# Patient Record
Sex: Female | Born: 1940 | Race: White | Hispanic: No | Smoking: Never smoker
Health system: Southern US, Community
[De-identification: ages and names within clinical notes are randomized; demographics above are authoritative.]

## PROBLEM LIST (undated history)

## (undated) DIAGNOSIS — T7840XA Allergy, unspecified, initial encounter: Secondary | ICD-10-CM

## (undated) DIAGNOSIS — K5790 Diverticulosis of intestine, part unspecified, without perforation or abscess without bleeding: Secondary | ICD-10-CM

## (undated) DIAGNOSIS — G4730 Sleep apnea, unspecified: Secondary | ICD-10-CM

## (undated) DIAGNOSIS — R002 Palpitations: Secondary | ICD-10-CM

## (undated) DIAGNOSIS — E039 Hypothyroidism, unspecified: Secondary | ICD-10-CM

## (undated) DIAGNOSIS — K2100 Gastro-esophageal reflux disease with esophagitis, without bleeding: Secondary | ICD-10-CM

## (undated) DIAGNOSIS — K219 Gastro-esophageal reflux disease without esophagitis: Secondary | ICD-10-CM

## (undated) DIAGNOSIS — E669 Obesity, unspecified: Secondary | ICD-10-CM

## (undated) DIAGNOSIS — Q423 Congenital absence, atresia and stenosis of anus without fistula: Secondary | ICD-10-CM

## (undated) DIAGNOSIS — E785 Hyperlipidemia, unspecified: Secondary | ICD-10-CM

## (undated) DIAGNOSIS — F329 Major depressive disorder, single episode, unspecified: Secondary | ICD-10-CM

## (undated) DIAGNOSIS — G4733 Obstructive sleep apnea (adult) (pediatric): Secondary | ICD-10-CM

## (undated) DIAGNOSIS — F32A Depression, unspecified: Secondary | ICD-10-CM

## (undated) DIAGNOSIS — I10 Essential (primary) hypertension: Secondary | ICD-10-CM

## (undated) DIAGNOSIS — K449 Diaphragmatic hernia without obstruction or gangrene: Secondary | ICD-10-CM

## (undated) DIAGNOSIS — M1990 Unspecified osteoarthritis, unspecified site: Secondary | ICD-10-CM

## (undated) DIAGNOSIS — Z5189 Encounter for other specified aftercare: Secondary | ICD-10-CM

## (undated) DIAGNOSIS — N189 Chronic kidney disease, unspecified: Secondary | ICD-10-CM

## (undated) DIAGNOSIS — E119 Type 2 diabetes mellitus without complications: Secondary | ICD-10-CM

## (undated) DIAGNOSIS — H269 Unspecified cataract: Secondary | ICD-10-CM

## (undated) HISTORY — PX: COLOSTOMY: SHX63

## (undated) HISTORY — DX: Allergy, unspecified, initial encounter: T78.40XA

## (undated) HISTORY — DX: Unspecified cataract: H26.9

## (undated) HISTORY — DX: Palpitations: R00.2

## (undated) HISTORY — PX: CARPAL TUNNEL RELEASE: SHX101

## (undated) HISTORY — DX: Sleep apnea, unspecified: G47.30

## (undated) HISTORY — DX: Diverticulosis of intestine, part unspecified, without perforation or abscess without bleeding: K57.90

## (undated) HISTORY — DX: Obesity, unspecified: E66.9

## (undated) HISTORY — PX: CYSTECTOMY: SUR359

## (undated) HISTORY — PX: COLON RESECTION: SHX5231

## (undated) HISTORY — DX: Hyperlipidemia, unspecified: E78.5

## (undated) HISTORY — DX: Congenital absence, atresia and stenosis of anus without fistula: Q42.3

## (undated) HISTORY — DX: Depression, unspecified: F32.A

## (undated) HISTORY — DX: Essential (primary) hypertension: I10

## (undated) HISTORY — DX: Gastro-esophageal reflux disease with esophagitis: K21.0

## (undated) HISTORY — DX: Hypothyroidism, unspecified: E03.9

## (undated) HISTORY — PX: UPPER GASTROINTESTINAL ENDOSCOPY: SHX188

## (undated) HISTORY — DX: Chronic kidney disease, unspecified: N18.9

## (undated) HISTORY — PX: RECTOVAGINAL FISTULA CLOSURE: SUR265

## (undated) HISTORY — DX: Gastro-esophageal reflux disease without esophagitis: K21.9

## (undated) HISTORY — DX: Major depressive disorder, single episode, unspecified: F32.9

## (undated) HISTORY — DX: Unspecified osteoarthritis, unspecified site: M19.90

## (undated) HISTORY — DX: Obstructive sleep apnea (adult) (pediatric): G47.33

## (undated) HISTORY — DX: Diaphragmatic hernia without obstruction or gangrene: K44.9

## (undated) HISTORY — DX: Type 2 diabetes mellitus without complications: E11.9

## (undated) HISTORY — DX: Encounter for other specified aftercare: Z51.89

## (undated) HISTORY — PX: APPENDECTOMY: SHX54

## (undated) HISTORY — DX: Gastro-esophageal reflux disease with esophagitis, without bleeding: K21.00

---
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
## 1997-04-06 ENCOUNTER — Ambulatory Visit (HOSPITAL_COMMUNITY): Admission: RE | Admit: 1997-04-06 | Discharge: 1997-04-06 | Payer: Self-pay | Admitting: Family Medicine

## 1998-08-09 ENCOUNTER — Encounter: Payer: Self-pay | Admitting: Family Medicine

## 1998-08-09 ENCOUNTER — Ambulatory Visit (HOSPITAL_COMMUNITY): Admission: RE | Admit: 1998-08-09 | Discharge: 1998-08-09 | Payer: Self-pay | Admitting: Family Medicine

## 1998-08-17 ENCOUNTER — Encounter: Payer: Self-pay | Admitting: Family Medicine

## 1998-08-17 ENCOUNTER — Ambulatory Visit (HOSPITAL_COMMUNITY): Admission: RE | Admit: 1998-08-17 | Discharge: 1998-08-17 | Payer: Self-pay | Admitting: Family Medicine

## 2000-03-14 ENCOUNTER — Ambulatory Visit (HOSPITAL_COMMUNITY): Admission: RE | Admit: 2000-03-14 | Discharge: 2000-03-14 | Payer: Self-pay | Admitting: Family Medicine

## 2002-01-15 ENCOUNTER — Ambulatory Visit (HOSPITAL_COMMUNITY): Admission: RE | Admit: 2002-01-15 | Discharge: 2002-01-15 | Payer: Self-pay | Admitting: Internal Medicine

## 2004-09-13 ENCOUNTER — Ambulatory Visit (HOSPITAL_BASED_OUTPATIENT_CLINIC_OR_DEPARTMENT_OTHER): Admission: RE | Admit: 2004-09-13 | Discharge: 2004-09-13 | Payer: Self-pay | Admitting: Family Medicine

## 2004-09-17 ENCOUNTER — Ambulatory Visit: Payer: Self-pay | Admitting: Internal Medicine

## 2005-04-23 ENCOUNTER — Ambulatory Visit: Payer: Self-pay | Admitting: Internal Medicine

## 2005-04-25 ENCOUNTER — Ambulatory Visit: Payer: Self-pay | Admitting: Internal Medicine

## 2005-05-15 ENCOUNTER — Encounter (INDEPENDENT_AMBULATORY_CARE_PROVIDER_SITE_OTHER): Payer: Self-pay | Admitting: Specialist

## 2005-05-15 ENCOUNTER — Ambulatory Visit: Payer: Self-pay | Admitting: Internal Medicine

## 2006-09-10 ENCOUNTER — Encounter: Admission: RE | Admit: 2006-09-10 | Discharge: 2006-09-10 | Payer: Self-pay | Admitting: Family Medicine

## 2006-12-03 ENCOUNTER — Ambulatory Visit (HOSPITAL_BASED_OUTPATIENT_CLINIC_OR_DEPARTMENT_OTHER): Admission: RE | Admit: 2006-12-03 | Discharge: 2006-12-03 | Payer: Self-pay | Admitting: Orthopedic Surgery

## 2007-03-10 ENCOUNTER — Ambulatory Visit: Payer: Self-pay | Admitting: Vascular Surgery

## 2007-03-10 ENCOUNTER — Ambulatory Visit: Admission: RE | Admit: 2007-03-10 | Discharge: 2007-03-10 | Payer: Self-pay | Admitting: Family Medicine

## 2007-03-10 ENCOUNTER — Encounter (INDEPENDENT_AMBULATORY_CARE_PROVIDER_SITE_OTHER): Payer: Self-pay | Admitting: Family Medicine

## 2007-12-17 ENCOUNTER — Encounter: Admission: RE | Admit: 2007-12-17 | Discharge: 2007-12-17 | Payer: Self-pay | Admitting: Family Medicine

## 2008-05-21 DIAGNOSIS — M790 Rheumatism, unspecified: Secondary | ICD-10-CM | POA: Insufficient documentation

## 2008-05-21 DIAGNOSIS — Z87738 Personal history of other specified (corrected) congenital malformations of digestive system: Secondary | ICD-10-CM

## 2008-05-21 DIAGNOSIS — Q431 Hirschsprung's disease: Secondary | ICD-10-CM | POA: Insufficient documentation

## 2008-05-21 DIAGNOSIS — M797 Fibromyalgia: Secondary | ICD-10-CM

## 2008-05-21 DIAGNOSIS — E039 Hypothyroidism, unspecified: Secondary | ICD-10-CM

## 2008-05-21 DIAGNOSIS — Q428 Congenital absence, atresia and stenosis of other parts of large intestine: Secondary | ICD-10-CM

## 2008-05-21 DIAGNOSIS — K5730 Diverticulosis of large intestine without perforation or abscess without bleeding: Secondary | ICD-10-CM | POA: Insufficient documentation

## 2008-05-21 DIAGNOSIS — Q423 Congenital absence, atresia and stenosis of anus without fistula: Secondary | ICD-10-CM

## 2008-05-21 DIAGNOSIS — K624 Stenosis of anus and rectum: Secondary | ICD-10-CM

## 2008-05-21 DIAGNOSIS — Q421 Congenital absence, atresia and stenosis of rectum without fistula: Secondary | ICD-10-CM

## 2008-05-21 DIAGNOSIS — K5900 Constipation, unspecified: Secondary | ICD-10-CM

## 2008-05-21 HISTORY — DX: Personal history of other specified (corrected) congenital malformations of digestive system: Z87.738

## 2008-05-26 ENCOUNTER — Ambulatory Visit: Payer: Self-pay | Admitting: Internal Medicine

## 2008-05-26 DIAGNOSIS — R1013 Epigastric pain: Secondary | ICD-10-CM

## 2008-05-26 DIAGNOSIS — K3189 Other diseases of stomach and duodenum: Secondary | ICD-10-CM

## 2008-06-08 ENCOUNTER — Encounter: Payer: Self-pay | Admitting: Internal Medicine

## 2008-06-08 ENCOUNTER — Ambulatory Visit: Payer: Self-pay | Admitting: Internal Medicine

## 2008-06-10 ENCOUNTER — Ambulatory Visit (HOSPITAL_COMMUNITY): Admission: RE | Admit: 2008-06-10 | Discharge: 2008-06-10 | Payer: Self-pay | Admitting: Internal Medicine

## 2008-06-11 ENCOUNTER — Encounter: Payer: Self-pay | Admitting: Internal Medicine

## 2008-06-14 ENCOUNTER — Ambulatory Visit: Payer: Self-pay | Admitting: Internal Medicine

## 2008-06-15 LAB — CONVERTED CEMR LAB: BUN: 34 mg/dL — ABNORMAL HIGH (ref 6–23)

## 2008-06-17 ENCOUNTER — Encounter: Payer: Self-pay | Admitting: Internal Medicine

## 2008-06-19 ENCOUNTER — Ambulatory Visit (HOSPITAL_COMMUNITY): Admission: RE | Admit: 2008-06-19 | Discharge: 2008-06-19 | Payer: Self-pay | Admitting: Internal Medicine

## 2008-08-26 ENCOUNTER — Ambulatory Visit: Payer: Self-pay | Admitting: Internal Medicine

## 2008-09-01 ENCOUNTER — Telehealth: Payer: Self-pay | Admitting: Internal Medicine

## 2008-09-06 ENCOUNTER — Encounter: Payer: Self-pay | Admitting: Internal Medicine

## 2009-01-31 ENCOUNTER — Encounter: Admission: RE | Admit: 2009-01-31 | Discharge: 2009-01-31 | Payer: Self-pay | Admitting: Family Medicine

## 2009-08-22 ENCOUNTER — Telehealth: Payer: Self-pay | Admitting: Internal Medicine

## 2009-08-29 ENCOUNTER — Ambulatory Visit: Payer: Self-pay | Admitting: Internal Medicine

## 2009-10-25 ENCOUNTER — Telehealth: Payer: Self-pay | Admitting: Internal Medicine

## 2010-02-15 ENCOUNTER — Ambulatory Visit (HOSPITAL_COMMUNITY)
Admission: RE | Admit: 2010-02-15 | Discharge: 2010-02-15 | Payer: Self-pay | Source: Home / Self Care | Attending: Family Medicine | Admitting: Family Medicine

## 2010-03-16 NOTE — Assessment & Plan Note (Signed)
Summary: DIARRHEA X2 WEEKS.               Hailey Hughes   History of Present Illness Visit Type: Follow-up Visit Primary GI MD: Lina Sar MD Primary Provider: Liana Gerold Requesting Provider: n/a Chief Complaint: Patient complains of diarrhea for the last 3 weeks. She did not take the Flagyl as directed she took them twice a day for 5 day so she has some extra tabs. She is still having some loose stools and urgency along with some gas. She states that she is having some generalized abdominal cramping.  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 Her last colonoscopy in April 2010 did not show any evidence of anastomotic stricture. She has a history of gastroesophageal reflux and a small hiatal hernia with mild gastritis which was seen on an upper endoscopy in April 2010. An abdominal ultrasound in April 2010 showed a hyperechoic lesion of the right kidney measuring 2.5 cm. 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 She has a two-week history of diarrhea which started with constipation. She was on herbal laxatives daily. Her diarrhea in the past was due to overflow. She has adult Hirschsprung's disease causing colonic inertia. She is doing much better now on Bentyl and Flagyl 250 mg twice a day.   GI Review of Systems    Reports abdominal pain and  bloating.     Location of  Abdominal pain: generalized.    Denies acid reflux, belching, chest pain, dysphagia with liquids, dysphagia with solids, heartburn, loss of appetite, nausea, vomiting, vomiting blood, weight loss, and  weight gain.      Reports diarrhea.     Denies anal fissure, black tarry stools, change in bowel habit, constipation, diverticulosis, fecal incontinence, heme positive stool, hemorrhoids,  irritable bowel syndrome, jaundice, light color stool, liver problems, rectal bleeding, and  rectal pain.    Current Medications (verified): 1)  Lotrel 5-10 Mg Caps (Amlodipine Besy-Benazepril Hcl) .... Take One By Mouth Once Daily 2)  Synthroid 112 Mcg Tabs (Levothyroxine Sodium) .Marland Kitchen.. 1 Once Daily 3)  Bumetanide 2 Mg Tabs (Bumetanide) .Marland Kitchen.. 1 Tablet By Mouth Once Daily 4)  Lipitor 20 Mg Tabs (Atorvastatin Calcium) .... Take One By Mouth Once Daily 5)  Metformin Hcl 500 Mg Xr24h-Tab (Metformin Hcl) .Marland Kitchen.. 1 Tablet By Mouth Two Times A Day 6)  Tizanidine Hcl 4 Mg Tabs (Tizanidine Hcl) .... Take 1 Tablet By Mouth At Bedtime and As Needed 7)  Qualaquin 324 Mg Caps (Quinine Sulfate) .Marland Kitchen.. 1 By Mouth Every 12 Hours 8)  Hydrocodone-Acetaminophen 5-500 Mg Tabs (Hydrocodone-Acetaminophen) .Marland Kitchen.. 1 By Mouth As Needed 9)  Byetta 10 Mcg Pen 10 Mcg/0.13ml Soln (Exenatide) .Marland Kitchen.. 10 Micrograms Subcutaneously Two Times A Day 10)  Aspirin 81 Mg Tbec (Aspirin) .Marland Kitchen.. 1 Once Daily 11)  Zyrtec Allergy 10 Mg Tabs (Cetirizine Hcl) .Marland Kitchen.. 1 By Mouth Once Daily 12)  Prevacid 30 Mg Cpdr (Lansoprazole) .... Take 1 Tablet By Mouth Two Times A Day 13)  Calcium 1200-1000 Mg-Unit Chew (Calcium Carbonate-Vit D-Min) .... Take One By Mouth Once Daily 14)  Metoprolol Tartrate 25 Mg Tabs (Metoprolol Tartrate) .... Take 1 Tablet By Mouth Two Times A Day 15)  Centrum Silver  Tabs (Multiple Vitamins-Minerals) .... Take  1 Tablet By Mouth Once A Day 16)  Elocon 0.1 % Crea (Mometasone Furoate) .... Apply As Needed For Exzema 17)  Bentyl 10 Mg Caps (Dicyclomine Hcl) .... Take 1 Tablet By Mouth Two Times A Day 18)  Align  Caps (Probiotic Product) .... Take One By Mouth Once Daily 19)  Tramadol Hcl 50 Mg Tabs (Tramadol Hcl) .... Take One By Mouth Three Times A Day 20)  Astelin 137 Mcg/spray Soln (Azelastine Hcl) .... Two Sprays Each Nostril As Needed 21)  Nasonex 50 Mcg/act Susp (Mometasone Furoate) .... Two Sprays Each Nostril Two Times A  Day 22)  Citrucel 500 Mg Tabs (Methylcellulose (Laxative)) .... Take One By Mouth Once Daily 23)  Vitamin D3 1000 Unit Tabs (Cholecalciferol) .... Take One By Mouth Once Daily  Allergies (verified): 1)  ! Epinephrine 2)  ! * Bee Stings  Past History:  Past Medical History: Reviewed history from 05/21/2008 and no changes required. Current Problems:  DIVERTICULOSIS, COLON (ICD-562.10) HYPOTHYROIDISM (ICD-244.9) FIBROSITIS (ICD-729.0) UNSPECIFIED CONSTIPATION (ICD-564.00) HIRSCHSPRUNG'S DISEASE (ICD-751.3) IMPERFORATE ANUS (ICD-751.2) Hx of STENOSIS OF RECTUM AND ANUS (ICD-569.2) ]  Past Surgical History: Appendectomy colostomy (age 20-6) due to imperforate anus sigmoid colon resection-age 60 Repair of congenital rectovaginal fistula Cyst removed from sacral region carpal tunnel release right  Family History: Family History of Heart Disease: Father, Brother Family History of Diabetes: Sister Family History of Colon Cancer: Maternal Uncle  Social History: Reviewed history from 05/26/2008 and no changes required. Alcohol Use - no Illicit Drug Use - no Occupation: retired Engineer, civil (consulting) Patient has never smoked.  Daily Caffeine Use  diet soda  Review of Systems       The patient complains of arthritis/joint pain, back pain, and muscle pains/cramps.  The patient denies allergy/sinus, anemia, anxiety-new, blood in urine, breast changes/lumps, change in vision, confusion, cough, coughing up blood, depression-new, fainting, fatigue, fever, headaches-new, hearing problems, heart murmur, heart rhythm changes, itching, menstrual pain, night sweats, nosebleeds, pregnancy symptoms, shortness of breath, skin rash, sleeping problems, sore throat, swelling of feet/legs, swollen lymph glands, thirst - excessive , urination - excessive , urination changes/pain, urine leakage, vision changes, and voice change.         .ros  Vital Signs:  Patient profile:   70 year old female Height:      65  inches Weight:      191.4 pounds BMI:     31.97 Pulse rate:   70 / minute Pulse rhythm:   regular BP sitting:   120 / 64  (left arm) Cuff size:   regular  Vitals Entered By: Harlow Mares CMA Duncan Dull) (August 29, 2009 1:52 PM)  Physical Exam  General:  Well developed, well nourished, no acute distress. Mouth:  No deformity or lesions, dentition normal. Neck:  Supple; no masses or thyromegaly. Lungs:  Clear throughout to auscultation. Heart:  Regular rate and rhythm; no murmurs, rubs,  or bruits. Abdomen:  soft nontender abdomen with normoactive bowel sounds. Liver edge at costal margin. Bowel sounds are normal. Rectal:  decreased rectal sphincter tone. Small amount of Hemoccult negative stool. Extremities:  No clubbing, cyanosis, edema or deformities noted. Psych:  Alert and cooperative. Normal mood and affect.   Impression & Recommendations:  Problem # 1:  HIRSCHSPRUNG'S DISEASE (ICD-751.3) Patient has Hirschsprung's disease of the sigmoid colon causing colonic inertia intermittently resulting in constipation. Her intermittent diarrhea is usually due to overflow. She will start on Citrucel 3 g daily; she will take 6 500mg   tablets of Citrucel.  She will also continue Bentyl 10 mg twice a day and continue on Flagyl 250 mg 3 times a day for one week of treatment. Her diet should consist of high-fiber foods. She is up-to-date on her flexible sigmoidoscopy.  Problem # 2:  DYSPEPSIA (ICD-536.8) Patient is to continue Prevacid 30 mg daily.  Patient Instructions: 1)  Citrucel tablets 500 mg take 3 p.o. b.i.d. 2)  Bentyl 10 mg p.o. b.i.d. 3)  Flagyl 250 mg p.o. t.i.d. x 1 week. 4)  High-fiber diet. 5)  Office visit p.r.n. 6)  Copy sent to : Dr Karen Chafe 7)  The medication list was reviewed and reconciled.  All changed / newly prescribed medications were explained.  A complete medication list was provided to the patient / caregiver.

## 2010-03-16 NOTE — Progress Notes (Signed)
Summary: Triage  Phone Note Call from Patient Call back at Home Phone [phone] Call back at Work Phone [phone]   Caller: Patient Call For: Dr. Juanda Chance Reason for Call: Talk to Nurse Summary of Call: pt. has had diarrhea x2 weeks with nausea Initial call taken by: Karna Christmas,  August 22, 2009 10:25 AM  Follow-up for Phone Call        Last seen 08-26-08. Pt. currently c/o 2 weeks of loose-watery bm's, urgency & cramping. Denies blood, black stools. Has tried Immodium and Pepto, not much help. [employer] daily.   1) See Dr.Jaelin Fackler on 08-29-09 at 1:30pm 2) Bentyl 10mg  two times a day for 1 week, then as needed 3) Continue daily Align 4) Immodium as needed for diarrhea. 5) High Fiber Diet, ample daily fluids. 6) If symptoms become worse call back immediately.  Follow-up by: Laureen Ochs LPN,  August 22, 2009 10:37 AM  Additional Follow-up for Phone Call Additional follow up Details #1::        I would also add Flagyl 250 mg by mouth three times a day x 7 days, #30, 0 refill Additional Follow-up by: Hart Carwin MD,  August 22, 2009 2:18 PM    Additional Follow-up for Phone Call Additional follow up Details #2::    Above MD orders reviewed with patient. Med. sent to pt. pharmacy. Pt. instructed to call back as needed.  Follow-up by: Laureen Ochs LPN,  August 22, 2009 2:37 PM  New/Updated Medications: METRONIDAZOLE 250 MG TABS (METRONIDAZOLE) Take 1 by mouth 3 times daily for 7 days. Prescriptions: METRONIDAZOLE 250 MG TABS (METRONIDAZOLE) Take 1 by mouth 3 times daily for 7 days.  #30 x 0   Entered by:   Laureen Ochs LPN   Authorized by:   Hart Carwin MD   Signed by:   Laureen Ochs LPN on 29/56/2130   Method used:   Electronically to        [facility]  [address] [id number] (retail)       [address]       [place], [place]  [address]       Ph: [phone] or [phone]       Fax: [phone]   RxID:   [id number]

## 2010-03-16 NOTE — Progress Notes (Signed)
Summary: update  Phone Note Call from Patient Call back at Work Phone [phone]   Caller: Patient Call For: Dr. Juanda Chance Reason for Call: Talk to Nurse Summary of Call: reporting still having problems with loose stools Initial call taken by: Vallarie Mare,  October 25, 2009 4:02 PM  Follow-up for Phone Call        Left message for patient to call back Darcey Nora RN, [facility]  October 26, 2009 8:43 AM  Patient is still having loose stools, mostly after meals.  She is requesting an increase in her dicyclomine.  She is requesting it  instead of BID she would like to take it ac meals.  She is no longer having urgency on a daily basis.  Mostly her issues are after meals.  Please advise Follow-up by: Darcey Nora RN, CGRN,  October 26, 2009 12:06 PM  Additional Follow-up for Phone Call Additional follow up Details #1::        chart reviewed, last OV 08/2009. Please increase her Bentyl to 20 mg , #90, 1 by mouth three times a day ( ac)., 3 refills. Additional Follow-up by: Hart Carwin MD,  October 26, 2009 4:40 PM    Additional Follow-up for Phone Call Additional follow up Details #2::    patient requests a 90 day supply I have called this in Follow-up by: Darcey Nora RN, CGRN,  October 27, 2009 8:41 AM  New/Updated Medications: DICYCLOMINE HCL 20 MG TABS (DICYCLOMINE HCL) 1 by mouth ac meals Prescriptions: DICYCLOMINE HCL 20 MG TABS (DICYCLOMINE HCL) 1 by mouth ac meals  #270 x 1   Entered by:   Darcey Nora RN, CGRN   Authorized by:   Hart Carwin MD   Signed by:   Darcey Nora RN, CGRN on 10/27/2009   Method used:   [facility] to        [facility]  [address] [id number]       [address]       [place], [place]  [address]       Ph: [phone] or [phone]       Fax: [phone]   RxID:   [id number]

## 2010-06-27 NOTE — Op Note (Signed)
Hailey Hughes, Hailey Hughes             ACCOUNT NO.:  [id number]   MEDICAL RECORD NO.:  [id number]          PATIENT TYPE:  AMB   LOCATION:  DSC                          FACILITY:  [facility]   PHYSICIAN:  Katy Fitch. Sypher, [name] DATE OF BIRTH:  Jun 10, 1940   DATE OF PROCEDURE:  12/03/2006  DATE OF DISCHARGE:                               OPERATIVE REPORT   PREOPERATIVE DIAGNOSES:  1. Severe right carpal tunnel syndrome.  2. Associated diabetes.   POSTOPERATIVE DIAGNOSES:  1. Severe right carpal tunnel syndrome.  2. Associated diabetes.   OPERATION:  Release of right transverse carpal ligament.   OPERATING SURGEON:  Katy Fitch. Sypher, [name]   ASSISTANT:  Marveen Reeks. Dasnoit, P.A.-C.   ANESTHESIA:  IV regional at forearm level.   SUPERVISING ANESTHESIOLOGIST:  Zenon Mayo, MD   INDICATIONS:  Hailey Hughes is a 70 year old woman with a history of  diabetes and severe obstructive sleep apnea.  She was referred by Dr.  Evelena Peat for evaluation of hand pain and numbness.  Clinical  examination suggested severe carpal tunnel syndrome and  electrodiagnostic studies confirmed severe median neuropathy.   She has had a sleep study accomplished by Dr. Maple Hudson on September 13, 2004  which revealed severe obstructive sleep apnea.  Dr. Sampson Goon  recommended proceeding with IV regional block in view of these findings.  After informed consent, she is brought to the operating room at this  time.   DESCRIPTION OF PROCEDURE:  Hailey Hughes is brought to the operating  room and placed in the supine position on the operating table.  Following placement of an IV regional block at proximal forearm level,  with a tourniquet at 300 mmHg due to systolic hypertension, the right  arm was prepped with Betadine soap and solution, and sterilely draped.  After waiting 10 minutes, anesthesia was satisfactory.  The incision  site was supplemented with 2% plain lidocaine subdermally.   The procedure  commenced with a short incision in the line of the ring  finger and the palm.  The subcutaneous tissues were carefully divided on  entering the palmar fascia.  This was split longitudinally to reveal the  common sensory branch of the median nerve and the superficial palmar  arch.  The distal margin of the transverse carpal ligament was isolated,  followed by release of the transverse carpal ligament along its ulnar  border, extending into the distal forearm.   Due to the IV regional the typical venous bleeding issues were  encountered.  The veins were isolated and electrocauterized.  A  __________  ENT retractor was used to assure complete release of the  volar forearm fascia, and the transverse carpal ligament.   The contents of carpal canal were inspected.  There was a rather  edematous tenosynovitis noted.  The median nerve was under significant  compression.  The wound was inspected for bleeding points, followed by  repair the skin with intradermal 3-0 Prolene suture.  A compressive  dressing was applied with a volar plaster splint maintaining the wrist  in 5 degrees of dorsiflexion.   For aftercare Hailey Hughes is provided  a prescription for Percocet 5 mg  one to two tablets p.o. q.4-6 h. p.r.n. pain 20 tablets without refill.  We will see her back for her follow up in the office in approximately 1  week for dressing change.  We anticipate suture removal at approximately  10-12 days.      Katy Fitch Sypher, [name]  Electronically Signed     RVS/MEDQ  D:  12/03/2006  T:  12/03/2006  Job:  [id number]   cc:   Evelena Peat, [name]

## 2010-06-30 NOTE — Procedures (Signed)
[name], Hailey Hughes             ACCOUNT NO.:  [id number]   MEDICAL RECORD NO.:  [id number]          PATIENT TYPE:  OUT   LOCATION:  SLEEP CENTER                 [facility]ACILITY[facility]   PHYSICIAN:  [name], [name] DATE OF BIRTH:  07-Jun-1940   DATE OF STUDY:  09/13/2004                              NOCTURNAL POLYSOMNOGRAM   REFERRING PHYSICIAN:  [name], MD   INDICATION FOR STUDY:  Hypersomnia with sleep apnea.  Epworth sleepiness  score 6/24, BMI 38, weight 230 pounds.   SLEEP ARCHITECTURE:  Showed short total sleep time 221 minutes with sleep  efficiency 66%.  Stage I 13%, stage II 74%, stages III and IV 4%, REM 9% of  total sleep time.  Sleep latency 9 minutes, REM latency 123 minutes, awake  after sleep onset 91 minutes.  Arousal index increased to 51.  No bedtime  medication was reported.   RESPIRATORY DATA:  Split study protocol.  Respiratory disturbance index  (RDI, AHI) 57.1 obstructive events per hour, indicating severe obstructive  sleep apnea/hypopnea syndrome before CPAP.  This included 59 obstructive  apneas and 79 hypopneas before CPAP.  Most sleep and events were while  supine.  REM RDI 67.  CPAP was titrated to 12 cwp, RDI 15.1 per hour, before  technician ran out of titration time.  A Petite ComfortGel mask was used  with heated humidifier.   OXYGEN DATA:  Moderate to loud snoring with oxygen desaturation to a nadir  of 67% before CPAP.  With CPAP control, saturation of 94-98% on room air.   CARDIAC DATA:  Normal sinus rhythm with rare PVCs.   MOVEMENT/PARASOMNIA:  Occasional leg jerk with little effect on sleep.   IMPRESSION/RECOMMENDATION:  1.  Severe obstructive sleep apnea/hypopnea syndrome, respiratory      disturbance index 57.1 per hour with moderate to loud snoring and oxygen      desaturation to 67%.  2.  Significant improvement with continuous positive airway pressure      titration to 12 cwp, respiratory disturbance index 15.1 per hour using  a      Petite ComfortGel mask with heated humidifier.  Consider starting the      patient on home continuous positive airway pressure at 12 cwp if this      treatment modality is chosen.  If she remains      symptomatic with significant home report of breakthrough apnea and      snoring, then pressure can be adjusted upwards.      [name], [name]  Diplomat    CDY/MEDQ  D:  09/17/2004 11:49:57  T:  09/18/2004 13:41:05  Job:  [id number]

## 2010-06-30 NOTE — Op Note (Signed)
NAME:  Hailey Hughes, Hailey Hughes                       ACCOUNT NO.:  [id number]   MEDICAL RECORD NO.:  [id number]                   PATIENT TYPE:  AMB   LOCATION:  ENDO                                 FACILITY:  [facility]   PHYSICIAN:  Lina Sar, [name] [facility]               DATE OF BIRTH:  October 05, 1940   DATE OF PROCEDURE:  01/15/2002  DATE OF DISCHARGE:                                 OPERATIVE REPORT   PROCEDURE:  Colonoscopy and dilatation of rectal stricture.   INDICATIONS:  This is a 70 year old white female who has a history of  _________ as an infant for what appeared to be an imperforate anus.  She was  seen several years ago  for anastomotic strictures which was dilated with  complete relief of this problem of her symptoms of incomplete evacuation.  She now has had recurrent symptoms of  incomplete emptying and is undergoing  colonoscopy and possible dilatation of the anastomotic stricture.   ENDOSCOPE:  Olympus _______  videoscope.   SEDATION:  Versed 7 mg IV, Demerol 75 mg IV.   FINDINGS:  The Olympus videoscope was passed into the rectum to the sigmoid  colon. The patient was  monitored by pulse oximeter.  Oxygen saturations  were between 85% to 98%. Her prep was adequate. The anal canal was normal  but rectal tone was somewhat decreased. Then about 5 cm from the rectum was  an anastomosis of the sigmoid colon, which was mildly narrowed to about 16  to 17 mm in diameter. The endoscope traveled through the anastomosis without  any pressure or any resistance. There were no active erosions or  inflammatory changes seen within the stricture. Proximal to the stricture in  the sigmoid colon, the patient had diffuse melanosis coli. There were no  definite diverticula. The colonoscope passed easily through the descending  colon, the splenic flexure, the transverse colon, hepatic flexure through  the ascending colon to the cecum. There was some residual stool in the cecal  pouch. This was  irrigated in order to see the entire mucosa of the cecal  pouch.   After inspecting the cecal pouch and the right colon, the colonoscope was  then retracted through the transverse colon to the  left colon. A guide wire  was then placed through the endoscope. The endoscope was retracted and a  Savary dilator 20 mm passed over the guide wire without fluoroscopic  guidance, up to a level of about 30 cm. There was mild resistance through  the stricture but there was no blood on the dilator. The patient tolerated  the procedure well.   IMPRESSION:  1. A sigmoid stricture at 5 cm from the rectum, benign, status post     dilatation to 20 mm with Savary dilator.  2. Melanosis coli.   PLAN:  1. Resume high fiber diet.  2. May use dietary supplement over the fiber, Citrucel or Metamucil.  3.  Consider adding mineral oil to improve the bowel habits.                                               Lina Sar, [name] [facility]   DB/MEDQ  D:  01/15/2002  T:  01/15/2002  Job:  [id number]

## 2010-08-18 ENCOUNTER — Other Ambulatory Visit: Payer: Self-pay | Admitting: Internal Medicine

## 2010-08-29 ENCOUNTER — Telehealth: Payer: Self-pay | Admitting: Internal Medicine

## 2010-08-29 NOTE — Telephone Encounter (Signed)
Patient has been advised that she needs to call the pharmacy that she wishes to switch prescriptions to and they will get rx transferred. Patient verbalizes understanding.

## 2010-11-22 LAB — BASIC METABOLIC PANEL
BUN: 12
Chloride: 104
Glucose, Bld: 76
Potassium: 3.6

## 2010-11-22 LAB — POCT HEMOGLOBIN-HEMACUE: Operator id: 128471

## 2011-06-26 ENCOUNTER — Encounter: Payer: Self-pay | Admitting: *Deleted

## 2011-10-12 ENCOUNTER — Other Ambulatory Visit: Payer: Self-pay | Admitting: Family Medicine

## 2011-10-12 DIAGNOSIS — Z1231 Encounter for screening mammogram for malignant neoplasm of breast: Secondary | ICD-10-CM

## 2011-10-12 DIAGNOSIS — Z780 Asymptomatic menopausal state: Secondary | ICD-10-CM

## 2011-10-25 ENCOUNTER — Encounter: Payer: Self-pay | Admitting: *Deleted

## 2011-10-26 ENCOUNTER — Ambulatory Visit
Admission: RE | Admit: 2011-10-26 | Discharge: 2011-10-26 | Disposition: A | Discharge status: Home / Self Care | Payer: Medicare Other | Source: Ambulatory Visit | Attending: Family Medicine | Admitting: Family Medicine

## 2011-10-26 ENCOUNTER — Ambulatory Visit: Payer: Self-pay

## 2011-10-26 ENCOUNTER — Other Ambulatory Visit: Payer: Self-pay

## 2011-10-26 DIAGNOSIS — Z780 Asymptomatic menopausal state: Secondary | ICD-10-CM

## 2011-10-26 DIAGNOSIS — Z1231 Encounter for screening mammogram for malignant neoplasm of breast: Secondary | ICD-10-CM

## 2011-10-30 ENCOUNTER — Encounter: Payer: Self-pay | Admitting: Cardiology

## 2013-01-06 ENCOUNTER — Other Ambulatory Visit (HOSPITAL_COMMUNITY): Payer: Self-pay | Admitting: Family Medicine

## 2013-01-06 DIAGNOSIS — Z1231 Encounter for screening mammogram for malignant neoplasm of breast: Secondary | ICD-10-CM

## 2013-01-29 ENCOUNTER — Ambulatory Visit (HOSPITAL_COMMUNITY)
Admission: RE | Admit: 2013-01-29 | Discharge: 2013-01-29 | Disposition: A | Discharge status: Home / Self Care | Payer: Medicare Other | Source: Ambulatory Visit | Attending: Family Medicine | Admitting: Family Medicine

## 2013-01-29 DIAGNOSIS — Z1231 Encounter for screening mammogram for malignant neoplasm of breast: Secondary | ICD-10-CM | POA: Insufficient documentation

## 2013-09-15 ENCOUNTER — Encounter: Payer: Self-pay | Admitting: Internal Medicine

## 2013-09-15 ENCOUNTER — Encounter: Payer: Self-pay | Admitting: Gastroenterology

## 2013-09-18 ENCOUNTER — Encounter: Payer: Self-pay | Admitting: *Deleted

## 2013-09-25 ENCOUNTER — Ambulatory Visit: Payer: Medicare Other | Admitting: Gastroenterology

## 2013-11-24 ENCOUNTER — Ambulatory Visit (INDEPENDENT_AMBULATORY_CARE_PROVIDER_SITE_OTHER): Payer: Medicare Other | Admitting: Internal Medicine

## 2013-11-24 ENCOUNTER — Encounter: Payer: Self-pay | Admitting: Internal Medicine

## 2013-11-24 ENCOUNTER — Other Ambulatory Visit: Payer: Medicare Other

## 2013-11-24 VITALS — BP 118/82 | HR 76 | Ht 62.5 in | Wt 202.0 lb

## 2013-11-24 DIAGNOSIS — K624 Stenosis of anus and rectum: Secondary | ICD-10-CM

## 2013-11-24 DIAGNOSIS — Z87738 Personal history of other specified (corrected) congenital malformations of digestive system: Secondary | ICD-10-CM

## 2013-11-24 DIAGNOSIS — R197 Diarrhea, unspecified: Secondary | ICD-10-CM

## 2013-11-24 MED ORDER — RANITIDINE HCL 300 MG PO TABS: 300.0000 mg | ORAL_TABLET | Freq: Every day | ORAL | Status: DC

## 2013-11-24 MED ORDER — MOVIPREP 100 G PO SOLR: 1.0000 | Freq: Once | ORAL | Status: DC

## 2013-11-24 MED ORDER — COLESTIPOL HCL 1 G PO TABS: 2.0000 g | ORAL_TABLET | Freq: Every day | ORAL | Status: DC

## 2013-11-24 NOTE — Patient Instructions (Addendum)
You have been scheduled for a colonoscopy. Please follow written instructions given to you at your visit today.  Please pick up your prep kit at the pharmacy within the next 1-3 days. If you use inhalers (even only as needed), please bring them with you on the day of your procedure. Your physician has requested that you go to www.startemmi.com and enter the access code given to you at your visit today. This web site gives a general overview about your procedure. However, you should still follow specific instructions given to you by our office regarding your preparation for the procedure.  We have sent the following medications to your pharmacy for you to pick up at your convenience: Ranitidine (in place of Prevacid) Colestipol (instead of Bentyl)  Please discontinue Prevacid and Bentyl.  Please purchase the following medications over the counter and take as directed: Benefiber 1 tablespoon once daily  Your physician has requested that you go to the basement for the following lab work before leaving today: Celiac 10 Panel  CC:Dr Kathryne Eriksson

## 2013-11-24 NOTE — Progress Notes (Signed)
Mareesa Gathright Jenne [birth year] [id number]  Note: This dictation was prepared with Dragon digital system. Any transcriptional errors that result from this procedure are unintentional.   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 Her last colonoscopy in 2010 did not show any stricture. She is complaining of frequent incontinent stools. They used to occur during the day only  but recently she had several episodes at night. She wears a protective pad.. There has been no bleeding. The stool is soft, pasty or watery. She has been on metformin 1,000 mg daily and Prevacid 30 mg daily. She has also been on Bentyl 20 mg 3 times a day but has not noticed any improvement. She takes Imodium when necessary as well as probiotics. Her weight has increased 10 pounds in the last several years. She claims certain foods run through her . An upper endoscopy in April 2010 showed a small hiatal hernia. An upper abdominal ultrasound at the same time was negative.    Past Medical History  Diagnosis Date  . HTN (hypertension)   . DM2 (diabetes mellitus, type 2)   . Obesity   . OSA (obstructive sleep apnea)   . Palpitations   . Hiatal hernia   . Diverticulosis   . Depression   . Hyperlipidemia   . Hypothyroidism   . Reflux esophagitis   . Imperforate anus     Past Surgical History  Procedure Laterality Date  . Colon resection      age 19  . Cystectomy      spinal  . Appendectomy    . Carpal tunnel release Right   . Colostomy      age 13 due to imperforate anus  . Rectovaginal fistula closure      congenital    Allergies  Allergen Reactions  . Epinephrine     REACTION: nervousness    Family history and social history have been  reviewed.  Review of Systems: Gradual weight gain. Occasional crampy abdominal pain. Frequent urgent stools which I incontinent  The remainder of the 10 point ROS is negative except as outlined in the H&P  Physical Exam: General Appearance Well developed, in no distress, or weight Eyes  Non icteric  HEENT  Non traumatic, normocephalic  Mouth No lesion, tongue papillated, no cheilosis Neck Supple without adenopathy, thyroid not enlarged, no carotid bruits, no JVD Lungs Clear to auscultation bilaterally COR Normal S1, normal S2, regular rhythm, no murmur, quiet precordium Abdomen obese soft with mild tenderness along the left lower quadrant. Deep post colostomy scars in the left middle quadrant in the right upper quadrant well healed. Normal active bowel sounds. No ascites. Rectal somewhat irritated perianal skin with some leakage of small amount of stool. Decreased rectal sphincter tone. Stricture at 5 cm at the tip of my finger. I could not pass through with my finger. Stool is Hemoccult negative. 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 We will proceed with a colonoscopy and dilatation. She will start colestipol 2 g daily and Benefiber 1 tablespoon daily. She will discontinue Bentyl and Prevacid and start on ranitidine 300 mg at bedtime. She will also continue on Imodium as needed. We will check a sprue profile today.    Lina Sar 11/24/2013

## 2013-11-25 LAB — CELIAC PANEL 10
ENDOMYSIAL SCREEN: NEGATIVE
GLIADIN IGA: 7.5 U/mL (ref <20)
Gliadin IgG: 6.6 U/mL (ref <20)
IGA: 197 mg/dL (ref 69–380)
TISSUE TRANSGLUTAMINASE AB, IGA: 5.3 U/mL (ref <20)
Tissue Transglut Ab: 7.2 U/mL (ref <20)

## 2013-12-02 ENCOUNTER — Ambulatory Visit (AMBULATORY_SURGERY_CENTER): Payer: Medicare Other | Admitting: Internal Medicine

## 2013-12-02 ENCOUNTER — Encounter: Payer: Self-pay | Admitting: Internal Medicine

## 2013-12-02 VITALS — BP 136/73 | HR 57 | Temp 96.2°F | Resp 15 | Ht 62.5 in | Wt 202.0 lb

## 2013-12-02 DIAGNOSIS — R32 Unspecified urinary incontinence: Secondary | ICD-10-CM

## 2013-12-02 DIAGNOSIS — R197 Diarrhea, unspecified: Secondary | ICD-10-CM

## 2013-12-02 LAB — GLUCOSE, CAPILLARY
GLUCOSE-CAPILLARY: 110 mg/dL — AB (ref 70–99)
Glucose-Capillary: 120 mg/dL — ABNORMAL HIGH (ref 70–99)

## 2013-12-02 MED ORDER — SODIUM CHLORIDE 0.9 % IV SOLN: 500.0000 mL | INTRAVENOUS | Status: DC

## 2013-12-02 NOTE — Patient Instructions (Addendum)
YOU HAD AN ENDOSCOPIC PROCEDURE TODAY AT THE [facility]: Refer to the procedure report that was given to you for any specific questions about what was found during the examination.  If the procedure report does not answer your questions, please call your gastroenterologist to clarify.  If you requested that your care partner not be given the details of your procedure findings, then the procedure report has been included in a sealed envelope for you to review at your convenience later.  YOU SHOULD EXPECT: Some feelings of bloating in the abdomen. Passage of more gas than usual.  Walking can help get rid of the air that was put into your GI tract during the procedure and reduce the bloating. If you had a lower endoscopy (such as a colonoscopy or flexible sigmoidoscopy) you may notice spotting of blood in your stool or on the toilet paper. If you underwent a bowel prep for your procedure, then you may not have a normal bowel movement for a few days.  DIET: Your first meal following the procedure should be a light meal and then it is ok to progress to your normal diet.  A half-sandwich or bowl of soup is an example of a good first meal.  Heavy or fried foods are harder to digest and may make you feel nauseous or bloated.  Likewise meals heavy in dairy and vegetables can cause extra gas to form and this can also increase the bloating.  Drink plenty of fluids but you should avoid alcoholic beverages for 24 hours.  ACTIVITY: Your care partner should take you home directly after the procedure.  You should plan to take it easy, moving slowly for the rest of the day.  You can resume normal activity the day after the procedure however you should NOT DRIVE or use heavy machinery for 24 hours (because of the sedation medicines used during the test).    SYMPTOMS TO REPORT IMMEDIATELY: A gastroenterologist can be reached at any hour.  During normal business hours, 8:30 AM to 5:00 PM Monday through Friday,  call [phone].  After hours and on weekends, please call the GI answering service at [phone] who will take a message and have the physician on call contact you.   Following lower endoscopy (colonoscopy or flexible sigmoidoscopy):  Excessive amounts of blood in the stool  Significant tenderness or worsening of abdominal pains  Swelling of the abdomen that is new, acute  Fever of 100F or higher  FOLLOW UP: Our staff will call the home number listed on your records the next business day following your procedure to check on you and address any questions or concerns that you may have at that time regarding the information given to you following your procedure. This is a courtesy call and so if there is no answer at the home number and we have not heard from you through the emergency physician on call, we will assume that you have returned to your regular daily activities without incident.  SIGNATURES/CONFIDENTIALITY: You and/or your care partner have signed paperwork which will be entered into your electronic medical record.  These signatures attest to the fact that that the information above on your After Visit Summary has been reviewed and is understood.  Full responsibility of the confidentiality of this discharge information lies with you and/or your care-partner.  Please continue your normal medications  Please read over handout about high fiber diets and use fiber supplements  Increase your physical activity to strengthen the  pelvic floor  Use Imodium if necessary for diarrhea  Next colonoscopy in 10 years

## 2013-12-02 NOTE — Progress Notes (Signed)
Report to PACU, RN, vss, BBS= Clear.  

## 2013-12-02 NOTE — Op Note (Signed)
[facility] [address] [place], [address]   COLONOSCOPY PROCEDURE REPORT  PATIENT: Hailey Hughes, Hailey Hughes  MR#: [id number] BIRTHDATE: 07-23-1940 , 73  yrs. old GENDER: female ENDOSCOPIST: Hart Carwin, MD REFERRED ZJ:IRCV Andrey Campanile, MD PROCEDURE DATE:  12/02/2013 PROCEDURE:   Colonoscopy, diagnostic First Screening Colonoscopy - Avg.  risk and is 50 yrs.  old or older - No.  Prior Negative Screening - Now for repeat screening. N/A  History of Adenoma - Now for follow-up colonoscopy & has been > or = to 3 yrs.  N/A  Polyps Removed Today? No. ASA CLASS:   Class II INDICATIONS:history of imperforate anus.  Status post sigmoid resection, colostomy and takedown of colostomy.  History of anastomotic rectal stricture.  Last colonoscopy in 2000 and. Patient is now having small volume fecal incontinence. MEDICATIONS: Monitored anesthesia care and Propofol 200 mg IV  DESCRIPTION OF PROCEDURE:   After the risks benefits and alternatives of the procedure were thoroughly explained, informed consent was obtained.  The digital rectal exam revealed no abnormalities of the rectum.   The LB [id number]  endoscope was introduced through the anus and advanced to the cecum, which was identified by both the appendix and ileocecal valve. No adverse events experienced.   The quality of the prep was excellent, using MoviPrep  The instrument was then slowly withdrawn as the colon was fully examined.      COLON FINDINGS: There was evidence of a prior end-to-end colo-colonic surgical anastomosis in the rectum.   At 5 cm from the rectum colocolonic anastomosis was widely patent.  The colon proximal to the anastomosis was mildly dilated.  There was a large amount of liquid stool which was easily aspirated.  There was one shallow diverticulum otherwise rest of the colon appeared normal. Total length of the colon was only 70 cm.  Retroflexed views revealed no abnormalities. The time  to cecum=4 minutes 58 seconds. Withdrawal time=6 minutes 41 seconds.  The scope was withdrawn and the procedure completed. COMPLICATIONS: There were no immediate complications.  ENDOSCOPIC IMPRESSION: 1.   There was evidence of a prior colo-colonic surgical anastomosis in the rectum 2.   At 5 cm from the rectum colocolonic anastomosis was widely patent.  The colon proximal to the anastomosis was mildly dilated. There was a large amount of liquid stool which was easily aspirated.  There was one shallow diverticulum otherwise in the sigmoid colon, rest of the colon appeared normal.  Total length of the colon was only 70 cm  RECOMMENDATIONS: 1.  High-fiber diet Fiber supplements Increase physical activity to strengthen the pelvic floor Use Imodium when necessary diarrhea 2.  Recall colonoscopy in 10 years  eSigned:  Hart Carwin, MD 12/02/2013 8:31 AM   cc:   PATIENT NAME:  [name], [name] MR#: [id number]

## 2013-12-03 ENCOUNTER — Telehealth: Payer: Self-pay | Admitting: *Deleted

## 2013-12-03 NOTE — Telephone Encounter (Signed)
  Follow up Call-  Call back number 12/02/2013  Post procedure Call Back phone  # [phone]  Permission to leave phone message Yes     Patient questions:  Do you have a fever, pain , or abdominal swelling? No. Pain Score  0 *  Have you tolerated food without any problems? Yes.    Have you been able to return to your normal activities? Yes.    Do you have any questions about your discharge instructions: Diet   No. Medications  No. Follow up visit  No.  Do you have questions or concerns about your Care? No.  Actions: * If pain score is 4 or above: No action needed, pain <4.

## 2013-12-10 NOTE — Telephone Encounter (Signed)
Error

## 2013-12-28 ENCOUNTER — Telehealth: Payer: Self-pay | Admitting: Internal Medicine

## 2013-12-28 NOTE — Telephone Encounter (Signed)
Spoke with patient and she states the Colestid helps a little with the diarrhea but not much. Still having diarrhea all day. Also reports the Zantac daily is not helping her acid reflux. Would like 90 day supply if new medications are ordered. Please, advise.

## 2014-02-09 MED ORDER — COLESTIPOL HCL 1 G PO TABS: 2.0000 g | ORAL_TABLET | Freq: Every day | ORAL | Status: DC

## 2014-02-09 MED ORDER — PANTOPRAZOLE SODIUM 40 MG PO TBEC: 40.0000 mg | DELAYED_RELEASE_TABLET | Freq: Every day | ORAL | Status: DC

## 2014-02-09 MED ORDER — COLESTIPOL HCL 1 G PO TABS: ORAL_TABLET | ORAL | Status: DC

## 2014-02-09 NOTE — Telephone Encounter (Signed)
In place of Zantac, try Protonix 40 mg/day, #90, 3 refills. May increase Colestipol to 2 tabs am ans 1 at pm.,#90 or #270 for 90 days),  3 refills

## 2014-02-09 NOTE — Addendum Note (Signed)
Addended by: Richardson Chiquito on: 02/09/2014 12:33 PM   Modules accepted: Orders, Medications

## 2014-02-09 NOTE — Telephone Encounter (Signed)
I have advised patient of Dr Dr Regino Schultze recommendations and have also sent protonix and additional colestipol refill to her pharmacy. She verbalizes understanding.

## 2014-02-09 NOTE — Telephone Encounter (Signed)
Patient states Zantac is not helping. Asking for something else.

## 2014-03-23 ENCOUNTER — Other Ambulatory Visit (HOSPITAL_COMMUNITY): Payer: Self-pay | Admitting: Family Medicine

## 2014-03-23 DIAGNOSIS — Z1231 Encounter for screening mammogram for malignant neoplasm of breast: Secondary | ICD-10-CM

## 2014-03-30 ENCOUNTER — Ambulatory Visit (HOSPITAL_COMMUNITY)
Admission: RE | Admit: 2014-03-30 | Discharge: 2014-03-30 | Disposition: A | Discharge status: Home / Self Care | Payer: Medicare Other | Source: Ambulatory Visit | Attending: Family Medicine | Admitting: Family Medicine

## 2014-03-30 DIAGNOSIS — Z1231 Encounter for screening mammogram for malignant neoplasm of breast: Secondary | ICD-10-CM | POA: Diagnosis not present

## 2014-05-06 ENCOUNTER — Other Ambulatory Visit: Payer: Self-pay | Admitting: Allergy and Immunology

## 2014-05-06 ENCOUNTER — Ambulatory Visit
Admission: RE | Admit: 2014-05-06 | Discharge: 2014-05-06 | Disposition: A | Discharge status: Home / Self Care | Payer: Medicare Other | Source: Ambulatory Visit | Attending: Allergy and Immunology | Admitting: Allergy and Immunology

## 2014-05-06 DIAGNOSIS — R05 Cough: Secondary | ICD-10-CM

## 2014-05-06 DIAGNOSIS — R059 Cough, unspecified: Secondary | ICD-10-CM

## 2015-04-07 ENCOUNTER — Other Ambulatory Visit: Payer: Self-pay | Admitting: Family Medicine

## 2015-04-07 DIAGNOSIS — E119 Type 2 diabetes mellitus without complications: Secondary | ICD-10-CM

## 2015-04-07 DIAGNOSIS — Z1231 Encounter for screening mammogram for malignant neoplasm of breast: Secondary | ICD-10-CM

## 2015-04-07 DIAGNOSIS — E785 Hyperlipidemia, unspecified: Secondary | ICD-10-CM | POA: Diagnosis present

## 2015-04-07 DIAGNOSIS — E2839 Other primary ovarian failure: Secondary | ICD-10-CM

## 2015-04-07 DIAGNOSIS — F419 Anxiety disorder, unspecified: Secondary | ICD-10-CM | POA: Insufficient documentation

## 2015-04-27 ENCOUNTER — Ambulatory Visit: Payer: Medicare Other

## 2015-04-27 ENCOUNTER — Inpatient Hospital Stay: Admission: RE | Admit: 2015-04-27 | Payer: Medicare Other | Source: Ambulatory Visit

## 2015-06-24 ENCOUNTER — Ambulatory Visit
Admission: RE | Admit: 2015-06-24 | Discharge: 2015-06-24 | Disposition: A | Discharge status: Home / Self Care | Payer: Medicare Other | Source: Ambulatory Visit | Attending: Family Medicine | Admitting: Family Medicine

## 2015-06-24 DIAGNOSIS — E2839 Other primary ovarian failure: Secondary | ICD-10-CM

## 2015-06-24 DIAGNOSIS — Z1231 Encounter for screening mammogram for malignant neoplasm of breast: Secondary | ICD-10-CM

## 2016-08-02 ENCOUNTER — Other Ambulatory Visit: Payer: Self-pay | Admitting: Family Medicine

## 2016-08-02 DIAGNOSIS — Z1231 Encounter for screening mammogram for malignant neoplasm of breast: Secondary | ICD-10-CM

## 2016-08-17 ENCOUNTER — Ambulatory Visit
Admission: RE | Admit: 2016-08-17 | Discharge: 2016-08-17 | Disposition: A | Discharge status: Home / Self Care | Payer: Medicare Other | Source: Ambulatory Visit | Attending: Family Medicine | Admitting: Family Medicine

## 2016-08-17 DIAGNOSIS — Z1231 Encounter for screening mammogram for malignant neoplasm of breast: Secondary | ICD-10-CM

## 2016-10-18 ENCOUNTER — Ambulatory Visit: Payer: Medicare Other | Attending: Family Medicine | Admitting: Physical Therapy

## 2016-10-18 DIAGNOSIS — M545 Low back pain, unspecified: Secondary | ICD-10-CM

## 2016-10-18 DIAGNOSIS — M6281 Muscle weakness (generalized): Secondary | ICD-10-CM | POA: Insufficient documentation

## 2016-10-18 DIAGNOSIS — M546 Pain in thoracic spine: Secondary | ICD-10-CM | POA: Insufficient documentation

## 2016-10-18 DIAGNOSIS — R293 Abnormal posture: Secondary | ICD-10-CM | POA: Diagnosis present

## 2016-10-18 NOTE — Therapy (Signed)
[facility] [address]  [address] [place], [place], [address] Phone: [phone]   Fax:  [phone]  Physical Therapy Evaluation  Patient Details  Name: ELIZABEHT SUTO MRN: [id number] Date of Birth: 12/31/1940 Referring Provider: Mady Gemma, PA-C  Encounter Date: 10/18/2016      PT End of Session - 10/18/16 1055    Visit Number 1   Number of Visits 12   Date for PT Re-Evaluation 11/29/16   Authorization Type [employer] Medicare   PT Start Time 1021   PT Stop Time 1102   PT Time Calculation (min) 41 min   Activity Tolerance Patient tolerated treatment well   Behavior During Therapy [facility] for tasks assessed/performed      Past Medical History:  Diagnosis Date  . Depression   . Diverticulosis   . DM2 (diabetes mellitus, type 2)   . Hiatal hernia   . HTN (hypertension)   . Hyperlipidemia   . Hypothyroidism   . Imperforate anus   . Obesity   . OSA (obstructive sleep apnea)   . Palpitations   . Reflux esophagitis     Past Surgical History:  Procedure Laterality Date  . APPENDECTOMY    . CARPAL TUNNEL RELEASE Right   . COLON RESECTION     age 76  . COLOSTOMY     age 54 due to imperforate anus  . CYSTECTOMY     spinal  . RECTOVAGINAL FISTULA CLOSURE     congenital    There were no vitals filed for this visit.       Subjective Assessment - 10/18/16 1025    Subjective Patient feels like she is starting to "hunch" over - reports pain at bra level of spine as well as B shoulder pain (intermittent). was questioning wearing a brace. Feels like she doesn't have much endurance. Denies pain/N&T into legs and feet.    Pertinent History OSA, HTN   How long can you sit comfortably? no issue   How long can you stand comfortably? < 1 hour   How long can you walk comfortably? <1 hour (better than standing)   Diagnostic tests none lately   Patient Stated Goals improve pain and function   Currently in Pain? Yes   Pain  Score 5    Pain Location Back   Pain Orientation Mid   Pain Descriptors / Indicators --  weakness   Pain Type Chronic pain   Pain Onset More than a month ago   Pain Frequency Intermittent   Aggravating Factors  prolonged standing, housework (chores), gardening   Pain Relieving Factors resting, pain meds (PRN)            [facility] PT Assessment - 10/18/16 1033      Assessment   Medical Diagnosis LBP without sciatica   Referring Provider Mady Gemma, PA-C   Next MD Visit prn   Prior Therapy no     Precautions   Precautions None     Restrictions   Weight Bearing Restrictions No     Balance Screen   Has the patient fallen in the past 6 months No   Has the patient had a decrease in activity level because of a fear of falling?  No   Is the patient reluctant to leave their home because of a fear of falling?  No     Home Environment   Living Environment Private residence   Type of Home House   Home Layout One  level   Additional Comments difficulty with stairs in the community     Prior Function   Level of Independence Independent   Vocation Retired   Secretary/administrator   Overall Cognitive Status Within Functional Limits for tasks assessed     Observation/Other Assessments   Focus on Therapeutic Outcomes (FOTO)  Lumbar: 44 (56% limited, predicted 45% limited)     Sensation   Light Touch Appears Intact     Coordination   Gross Motor Movements are Fluid and Coordinated Yes     Posture/Postural Control   Posture/Postural Control Postural limitations   Postural Limitations Rounded Shoulders;Forward head     ROM / Strength   AROM / PROM / Strength AROM;Strength     AROM   AROM Assessment Site Lumbar   Lumbar Flexion fingertip to anterior ankle - HS pull   Lumbar Extension 25% limited - not bothersome   Lumbar - Right Side Bend fingertip to joint line - pain in lower back   Lumbar - Left Side Bend fingertip to joint line - same as R side   Lumbar -  Right Rotation 50% limited - same as L side   Lumbar - Left Rotation 25% limited - achiness in back and neck     Strength   Overall Strength Comments B LE grossly 4-/5     Flexibility   Soft Tissue Assessment /Muscle Length yes   Hamstrings B: moderate tightnes   Quadriceps B: moderate tightnes     Palpation   Palpation comment TTP along thoracic and lumbar paraspinals, glute/piriformis            Objective measurements completed on examination: See above findings.          [facility] Adult PT Treatment/Exercise - 10/18/16 1033      Exercises   Exercises Lumbar     Lumbar Exercises: Stretches   Passive Hamstring Stretch 2 reps;30 seconds   Passive Hamstring Stretch Limitations bilateral with strap     Lumbar Exercises: Standing   Row Both;15 reps   Theraband Level (Row) Level 2 (Red)     Lumbar Exercises: Seated   Other Seated Lumbar Exercises scap retraction 12x 5 sec hold   Other Seated Lumbar Exercises chin tuck 12 x 5 sec hold     Lumbar Exercises: Supine   Bridge 10 reps                PT Education - 10/18/16 1054    Education provided Yes   Education Details exam findings, POC, HEP   Person(s) Educated Patient   Methods Explanation;Demonstration;Handout   Comprehension Verbalized understanding;Returned demonstration             PT Long Term Goals - 10/18/16 1304      PT LONG TERM GOAL #1   Title patient to be independent with advanced HEP   Status New   Target Date 11/29/16     PT LONG TERM GOAL #2   Title patient to report pain reduction by >/= 50% for greater than 2 weeks   Status New   Target Date 11/29/16     PT LONG TERM GOAL #3   Title patient to demonstrate good postural awareness and body mechanics as needs for daily activities.   Status New   Target Date 11/29/16     PT LONG TERM GOAL #4   Title patient to improve B LE strength to >/=4+/5 for improved pain and function  Status New   Target Date 11/29/16     PT  LONG TERM GOAL #5   Title patient to report ability to stand/walk >/= 1 hour without pain limiting   Status New   Target Date 11/29/16                Plan - Oct 26, 2016 1305    Clinical Impression Statement Patient is a 76 y/o female presenting to OPPT today for primary complaints of thoracic and low back pain wiht no known mechanism of injury. patient reporting pain daily that interferes with household chores and daily functions. Patient today with reduced lumbar AROM, proximal hip weakness, reduced flexibility at anterior and posterior hip as well as TTP along thoracicand lumbar paraspinals and into glute/piriformis. Patient given initial HEP today for gentle stretching and strengthening with good carryover. Patient to benefit form PT to address pain and functional mobility deficits to allow for improved QOL.    Clinical Presentation Stable   Clinical Decision Making Low   Rehab Potential Good   PT Frequency 2x / week   PT Duration 6 weeks   PT Treatment/Interventions ADLs/Self Care Home Management;Cryotherapy;Electrical Stimulation;Iontophoresis 4mg /ml Dexamethasone;Moist Heat;Traction;Ultrasound;Neuromuscular re-education;Balance training;Therapeutic exercise;Therapeutic activities;Functional mobility training;Patient/family education;Manual techniques;Passive range of motion;Vasopneumatic Device;Taping;Dry needling   Consulted and Agree with Plan of Care Patient      Patient will benefit from skilled therapeutic intervention in order to improve the following deficits and impairments:  Pain, Decreased strength, Difficulty walking, Decreased mobility, Decreased activity tolerance  Visit Diagnosis: Pain in thoracic spine - Plan: PT plan of care cert/re-cert  Bilateral low back pain without sciatica, unspecified chronicity - Plan: PT plan of care cert/re-cert  Muscle weakness (generalized) - Plan: PT plan of care cert/re-cert  Abnormal posture - Plan: PT plan of care  cert/re-cert      G-Codes - Oct 26, 2016 1059    Functional Assessment Tool Used (Outpatient Only) FOTO: 44 (56% limited)   Functional Limitation Mobility: Walking and moving around   Mobility: Walking and Moving Around Current Status (W6203) At least 40 percent but less than 60 percent impaired, limited or restricted   Mobility: Walking and Moving Around Goal Status (T5974) At least 40 percent but less than 60 percent impaired, limited or restricted       Problem List Patient Active Problem List   Diagnosis Date Noted  . DYSPEPSIA 05/26/2008  . ABDOMINAL PAIN, EPIGASTRIC 05/26/2008  . HYPOTHYROIDISM 05/21/2008  . DIVERTICULOSIS, COLON 05/21/2008  . UNSPECIFIED CONSTIPATION 05/21/2008  . STENOSIS OF RECTUM AND ANUS 05/21/2008  . FIBROSITIS 05/21/2008  . 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  [address] [place], [place], [address] Phone: [phone]   Fax:  [phone]  Name: CELIE GIROLAMO MRN: [id number] Date of Birth: [birth year]

## 2016-10-18 NOTE — Patient Instructions (Signed)
Hamstring Step 2   Left foot relaxed, knee straight, other leg bent, foot flat. Raise straight leg further upward to maximal range. Hold __30_ seconds. Relax leg completely down. Repeat __3_ times.  Bridge   Lie back, legs bent. Inhale, pressing hips up. Keeping ribs in, lengthen lower back. Exhale, rolling down along spine from top. Repeat __15__ times. Do __2__ sessions per day.  Shoulder (Scapula) Retraction   Pull shoulders back, squeezing shoulder blades together. Repeat __15__ times per session.  Position: Standing   Resistive Band Rowing   With resistive band anchored in door, grasp both ends. Keeping elbows bent, pull back, squeezing shoulder blades together. Hold __5__ seconds. Repeat __15__ times. Do __2__ sessions per day.  Axial Extension (Chin Tuck)   Pull chin in and lengthen back of neck. Hold __5__ seconds while counting out loud. Repeat __15__ times. Do __2__ sessions per day.

## 2016-10-22 ENCOUNTER — Ambulatory Visit: Payer: Medicare Other

## 2016-10-22 DIAGNOSIS — R293 Abnormal posture: Secondary | ICD-10-CM

## 2016-10-22 DIAGNOSIS — M546 Pain in thoracic spine: Secondary | ICD-10-CM | POA: Diagnosis not present

## 2016-10-22 DIAGNOSIS — M545 Low back pain, unspecified: Secondary | ICD-10-CM

## 2016-10-22 DIAGNOSIS — M6281 Muscle weakness (generalized): Secondary | ICD-10-CM

## 2016-10-22 NOTE — Therapy (Signed)
[facility] [address]  [address] [place], [place], [address] Phone: [phone]   Fax:  [phone]  Physical Therapy Treatment  Patient Details  Name: CARIA TRANSUE MRN: [id number] Date of Birth: 1940-03-27 Referring Provider: Mady Gemma, PA-C  Encounter Date: 10/22/2016      PT End of Session - 10/22/16 1415    Visit Number 2   Number of Visits 12   Date for PT Re-Evaluation 11/29/16   Authorization Type UHC Medicare   PT Start Time 1403   PT Stop Time 1455   PT Time Calculation (min) 52 min   Activity Tolerance Patient tolerated treatment well   Behavior During Therapy [facility] for tasks assessed/performed      Past Medical History:  Diagnosis Date  . Depression   . Diverticulosis   . DM2 (diabetes mellitus, type 2)   . Hiatal hernia   . HTN (hypertension)   . Hyperlipidemia   . Hypothyroidism   . Imperforate anus   . Obesity   . OSA (obstructive sleep apnea)   . Palpitations   . Reflux esophagitis     Past Surgical History:  Procedure Laterality Date  . APPENDECTOMY    . CARPAL TUNNEL RELEASE Right   . COLON RESECTION     age 26  . COLOSTOMY     age 65 due to imperforate anus  . CYSTECTOMY     spinal  . RECTOVAGINAL FISTULA CLOSURE     congenital    There were no vitals filed for this visit.      Subjective Assessment - 10/22/16 1408    Subjective Pt. reporting issue with one HEP activity and wishes to review.     Patient Stated Goals improve pain and function   Currently in Pain? Yes   Pain Score 6    Pain Location Back   Pain Orientation Lower;Mid   Pain Descriptors / Indicators Dull;Discomfort   Pain Type Chronic pain   Pain Onset More than a month ago   Pain Frequency Intermittent   Aggravating Factors  prolonged standing,    Multiple Pain Sites No                         [facility] Adult PT Treatment/Exercise - 10/22/16 1501      Exercises   Exercises Neck     Neck Exercises: Seated   Neck Retraction 15 reps;5 secs   Neck Retraction Limitations Some verbal cueing required for technique     Lumbar Exercises: Stretches   Passive Hamstring Stretch 2 reps;30 seconds   Passive Hamstring Stretch Limitations bilateral with strap     Lumbar Exercises: Aerobic   Stationary Bike NuStep: lvl 3, 5 min      Lumbar Exercises: Machines for Strengthening   Other Lumbar Machine Exercise BATCA low row 20# x 15 reps     Lumbar Exercises: Standing   Scapular Retraction 15 reps   Scapular Retraction Limitations tactile cueing for full scapular retraction   Row Both;15 reps  5" hold    Theraband Level (Row) Level 2 (Red)     Lumbar Exercises: Supine   Bridge 15 reps;3 seconds   Bridge Limitations cueing required for breathing      Modalities   Modalities Moist Heat     Moist Heat Therapy   Number Minutes Moist Heat 10 Minutes   Moist Heat Location Lumbar Spine  mid back  Manual Therapy   Manual Therapy Soft tissue mobilization   Manual therapy comments seated    Soft tissue mobilization STM to B medial scapular border over area of tenderness; good relief noted with this                     PT Long Term Goals - 10/22/16 1431      PT LONG TERM GOAL #1   Title patient to be independent with advanced HEP   Status On-going     PT LONG TERM GOAL #2   Title patient to report pain reduction by >/= 50% for greater than 2 weeks   Status On-going     PT LONG TERM GOAL #3   Title patient to demonstrate good postural awareness and body mechanics as needs for daily activities.   Status On-going     PT LONG TERM GOAL #4   Title patient to improve B LE strength to >/=4+/5 for improved pain and function   Status On-going     PT LONG TERM GOAL #5   Title patient to report ability to stand/walk >/= 1 hour without pain limiting   Status On-going               Plan - 10/22/16 1429    Clinical Impression Statement Pt. doing  well today noting she has been performing HEP however not sure if she is performing bridge correctly.  Good overall technique with HEP with some cueing required for hold times and to encourage full scalp. retraction.  Treatment focusing on postural training and scapular strengthening as pt. verbalizing primary concern is correcting posture.  Pt. reporting some tenderness in scapular area, which responded well to [facility] with, reported relief.  Pt. with noted tone in this area thus treatment ending with moist heat to reduce tone.  Will continued to progress per pt. tolerance in coming visits.     PT Treatment/Interventions ADLs/Self Care Home Management;Cryotherapy;Electrical Stimulation;Iontophoresis 4mg /ml Dexamethasone;Moist Heat;Traction;Ultrasound;Neuromuscular re-education;Balance training;Therapeutic exercise;Therapeutic activities;Functional mobility training;Patient/family education;Manual techniques;Passive range of motion;Vasopneumatic Device;Taping;Dry needling      Patient will benefit from skilled therapeutic intervention in order to improve the following deficits and impairments:  Pain, Decreased strength, Difficulty walking, Decreased mobility, Decreased activity tolerance  Visit Diagnosis: Pain in thoracic spine  Bilateral low back pain without sciatica, unspecified chronicity  Muscle weakness (generalized)  Abnormal posture     Problem List Patient Active Problem List   Diagnosis Date Noted  . DYSPEPSIA 05/26/2008  . ABDOMINAL PAIN, EPIGASTRIC 05/26/2008  . HYPOTHYROIDISM 05/21/2008  . DIVERTICULOSIS, COLON 05/21/2008  . UNSPECIFIED CONSTIPATION 05/21/2008  . STENOSIS OF RECTUM AND ANUS 05/21/2008  . FIBROSITIS 05/21/2008  . 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 HIRSCHSPRUNG'S DISEASE 05/21/2008    07/21/2008, PTA 10/22/16 3:13 PM  [facility] [address]  [address] [place], [place], [place] Phone: [phone]    Fax:  [phone]  Name: AZELYN BATIE MRN: Charlotte Sanes Date of Birth: February 22, 1940

## 2016-10-25 ENCOUNTER — Ambulatory Visit: Payer: Medicare Other

## 2016-10-25 DIAGNOSIS — M546 Pain in thoracic spine: Secondary | ICD-10-CM | POA: Diagnosis not present

## 2016-10-25 DIAGNOSIS — M545 Low back pain, unspecified: Secondary | ICD-10-CM

## 2016-10-25 DIAGNOSIS — R293 Abnormal posture: Secondary | ICD-10-CM

## 2016-10-25 DIAGNOSIS — M6281 Muscle weakness (generalized): Secondary | ICD-10-CM

## 2016-10-25 NOTE — Therapy (Signed)
[facility] [address]  [address] [place], [place], [address] Phone: [phone]   Fax:  [phone]  Physical Therapy Treatment  Patient Details  Name: Hailey Hughes MRN: [id number] Date of Birth: 07-31-1940 Referring Provider: Mady Gemma, PA-C  Encounter Date: 10/25/2016      PT End of Session - 10/25/16 1022    Visit Number 3   Number of Visits 12   Date for PT Re-Evaluation 11/29/16   Authorization Type UHC Medicare   PT Start Time 1016   PT Stop Time 1106  10 min moist heat to end treatment    PT Time Calculation (min) 50 min   Activity Tolerance Patient tolerated treatment well   Behavior During Therapy [facility] for tasks assessed/performed      Past Medical History:  Diagnosis Date  . Depression   . Diverticulosis   . DM2 (diabetes mellitus, type 2)   . Hiatal hernia   . HTN (hypertension)   . Hyperlipidemia   . Hypothyroidism   . Imperforate anus   . Obesity   . OSA (obstructive sleep apnea)   . Palpitations   . Reflux esophagitis     Past Surgical History:  Procedure Laterality Date  . APPENDECTOMY    . CARPAL TUNNEL RELEASE Right   . COLON RESECTION     age 23  . COLOSTOMY     age 1 due to imperforate anus  . CYSTECTOMY     spinal  . RECTOVAGINAL FISTULA CLOSURE     congenital    There were no vitals filed for this visit.      Subjective Assessment - 10/25/16 1020    Subjective Pt. reporting doing HEP without issue with exception of having some cramping with HS muscles with bridge.     Patient Stated Goals improve pain and function   Currently in Pain? Yes   Pain Score 5    Pain Location Back   Pain Orientation Lower   Pain Descriptors / Indicators Dull;Discomfort   Pain Radiating Towards none   Pain Onset More than a month ago   Multiple Pain Sites No                         [facility] Adult PT Treatment/Exercise - 10/25/16 1026      Lumbar Exercises:  Stretches   Single Knee to Chest Stretch 2 reps;30 seconds   Piriformis Stretch 2 reps;30 seconds   Piriformis Stretch Limitations Figure-4, mod piri  pt. confirming stretch in tight area      Lumbar Exercises: Aerobic   Stationary Bike NuStep: lvl 4, 6 min      Lumbar Exercises: Standing   Row Both;15 reps   Theraband Level (Row) Level 2 (Red)   Other Standing Lumbar Exercises B shoulder ER with yellow TB leaning next to doorseal 3" x 10 reps; cues for scapular squeeze + chin tuck      Lumbar Exercises: Supine   Bridge 15 reps;3 seconds   Bridge Limitations Pt. cramping at home  encouraged not wear socks to prevent HS cramping on bed      Knee/Hip Exercises: Standing   Hip Flexion Right;Left;Knee straight;10 reps   Hip Flexion Limitations 2#    Hip Abduction Right;Left;10 reps;Knee straight   Abduction Limitations 2#    Hip Extension Right;Left;10 reps;Knee straight   Extension Limitations 2#; 45 dg kickout      Moist Heat Therapy  Number Minutes Moist Heat 10 Minutes   Moist Heat Location Lumbar Spine  + mid back     Manual Therapy   Manual Therapy Soft tissue mobilization;Myofascial release   Manual therapy comments L sidelying with R LE elevated on bolster   Soft tissue mobilization STM to B lumbar/thoracic paraspinals over areas of tenderness; most tenderness in L mid thoracic, and R medial scap. border    Myofascial Release TPR to R buttocks near midline over areas of most tenderness                      PT Long Term Goals - 10/22/16 1431      PT LONG TERM GOAL #1   Title patient to be independent with advanced HEP   Status On-going     PT LONG TERM GOAL #2   Title patient to report pain reduction by >/= 50% for greater than 2 weeks   Status On-going     PT LONG TERM GOAL #3   Title patient to demonstrate good postural awareness and body mechanics as needs for daily activities.   Status On-going     PT LONG TERM GOAL #4   Title patient to  improve B LE strength to >/=4+/5 for improved pain and function   Status On-going     PT LONG TERM GOAL #5   Title patient to report ability to stand/walk >/= 1 hour without pain limiting   Status On-going               Plan - 10/25/16 1022    Clinical Impression Statement Pt. doing well today however noting some HS cramping with bridge activity.  Unable to reproduce cramping in treatment and pt. encouraged to perform bridge without socks on bed to prevent cramping of HS.  TTP in thoracic/lumbar paraspinals and glute complex thus STM in these areas to decrease tenderness and relax musculature.  Tolerated all scapular strengthening, postural re-education, and lumbopelivc stretching well today.  Additional postural activities added today without issue.  Pt. seems to be progressing well noting morning pain improving.     PT Treatment/Interventions ADLs/Self Care Home Management;Cryotherapy;Electrical Stimulation;Iontophoresis 4mg /ml Dexamethasone;Moist Heat;Traction;Ultrasound;Neuromuscular re-education;Balance training;Therapeutic exercise;Therapeutic activities;Functional mobility training;Patient/family education;Manual techniques;Passive range of motion;Vasopneumatic Device;Taping;Dry needling      Patient will benefit from skilled therapeutic intervention in order to improve the following deficits and impairments:  Pain, Decreased strength, Difficulty walking, Decreased mobility, Decreased activity tolerance  Visit Diagnosis: Pain in thoracic spine  Bilateral low back pain without sciatica, unspecified chronicity  Muscle weakness (generalized)  Abnormal posture     Problem List Patient Active Problem List   Diagnosis Date Noted  . DYSPEPSIA 05/26/2008  . ABDOMINAL PAIN, EPIGASTRIC 05/26/2008  . HYPOTHYROIDISM 05/21/2008  . DIVERTICULOSIS, COLON 05/21/2008  . UNSPECIFIED CONSTIPATION 05/21/2008  . STENOSIS OF RECTUM AND ANUS 05/21/2008  . FIBROSITIS 05/21/2008  .  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 HIRSCHSPRUNG'S DISEASE 05/21/2008    07/21/2008, PTA 10/25/16 11:27 AM  [facility] [address]  [address] [place], [place], [place] Phone: [phone]   Fax:  [phone]  Name: Hailey Hughes MRN: Charlotte Sanes Date of Birth: 1940/06/29

## 2016-10-29 ENCOUNTER — Ambulatory Visit: Payer: Medicare Other | Admitting: Physical Therapy

## 2016-10-29 DIAGNOSIS — R293 Abnormal posture: Secondary | ICD-10-CM

## 2016-10-29 DIAGNOSIS — M546 Pain in thoracic spine: Secondary | ICD-10-CM

## 2016-10-29 DIAGNOSIS — M6281 Muscle weakness (generalized): Secondary | ICD-10-CM

## 2016-10-29 DIAGNOSIS — M545 Low back pain, unspecified: Secondary | ICD-10-CM

## 2016-10-29 NOTE — Therapy (Signed)
[facility] [address]  [address] [place], [place], [address] Phone: [phone]   Fax:  [phone]  Physical Therapy Treatment  Patient Details  Name: Hailey Hughes MRN: [id number] Date of Birth: 22-Oct-1940 Referring Provider: Mady Gemma, PA-C  Encounter Date: 10/29/2016      PT End of Session - 10/29/16 1318    Visit Number 4   Number of Visits 12   Date for PT Re-Evaluation 11/29/16   Authorization Type UHC Medicare   PT Start Time 1316   PT Stop Time 1412  moist heat   PT Time Calculation (min) 56 min   Activity Tolerance Patient tolerated treatment well   Behavior During Therapy [facility] for tasks assessed/performed      Past Medical History:  Diagnosis Date  . Depression   . Diverticulosis   . DM2 (diabetes mellitus, type 2)   . Hiatal hernia   . HTN (hypertension)   . Hyperlipidemia   . Hypothyroidism   . Imperforate anus   . Obesity   . OSA (obstructive sleep apnea)   . Palpitations   . Reflux esophagitis     Past Surgical History:  Procedure Laterality Date  . APPENDECTOMY    . CARPAL TUNNEL RELEASE Right   . COLON RESECTION     age 11  . COLOSTOMY     age 71 due to imperforate anus  . CYSTECTOMY     spinal  . RECTOVAGINAL FISTULA CLOSURE     congenital    There were no vitals filed for this visit.      Subjective Assessment - 10/29/16 1317    Subjective feeling well today - "I think I might be starting to feel some results"   Pertinent History OSA, HTN   Patient Stated Goals improve pain and function   Currently in Pain? No/denies   Pain Score 0-No pain                         [facility] Adult PT Treatment/Exercise - 10/29/16 1318      Exercises   Exercises Neck;Lumbar     Neck Exercises: Machines for Strengthening   UBE (Upper Arm Bike) L2 x 6 min (3/3)     Neck Exercises: Theraband   Shoulder External Rotation 15 reps  yellow tband   Shoulder External  Rotation Limitations hooklying on pool noodle   Horizontal ABduction 15 reps  yellow tband   Horizontal ABduction Limitations hooklying on pool noodle   Other Theraband Exercises diagonal flexion - yellow tband x 15 reps R UE only - hooklying on pool noodle     Lumbar Exercises: Seated   Long Arc Quad on Chair Strengthening;Both;15 reps   LAQ on Chair Weights (lbs) seated on blue disc - alternating kick outs   Hip Flexion on Ball Strengthening;Both;15 reps   Hip Flexion on Ball Limitations seated on blue disc - alternating marches   Other Seated Lumbar Exercises row - seated on blue disc - red tband x 15 reps   Other Seated Lumbar Exercises ab set seated on blue disc x 10 with 5 sec hold     Lumbar Exercises: Sidelying   Other Sidelying Lumbar Exercises open book - 10 x 10 each side     Moist Heat Therapy   Number Minutes Moist Heat 10 Minutes   Moist Heat Location Lumbar Spine  thoracic  PT Long Term Goals - 10/22/16 1431      PT LONG TERM GOAL #1   Title patient to be independent with advanced HEP   Status On-going     PT LONG TERM GOAL #2   Title patient to report pain reduction by >/= 50% for greater than 2 weeks   Status On-going     PT LONG TERM GOAL #3   Title patient to demonstrate good postural awareness and body mechanics as needs for daily activities.   Status On-going     PT LONG TERM GOAL #4   Title patient to improve B LE strength to >/=4+/5 for improved pain and function   Status On-going     PT LONG TERM GOAL #5   Title patient to report ability to stand/walk >/= 1 hour without pain limiting   Status On-going               Plan - 10/29/16 1318    Clinical Impression Statement Patient doing well today with all mid back stretching and strengthening with no pain production during treatment. Good progression of all core strengthening on uneven surface with no isuse. Will plan to continue to progress towards goals as  patient tolerates.    PT Treatment/Interventions ADLs/Self Care Home Management;Cryotherapy;Electrical Stimulation;Iontophoresis 4mg /ml Dexamethasone;Moist Heat;Traction;Ultrasound;Neuromuscular re-education;Balance training;Therapeutic exercise;Therapeutic activities;Functional mobility training;Patient/family education;Manual techniques;Passive range of motion;Vasopneumatic Device;Taping;Dry needling   Consulted and Agree with Plan of Care Patient      Patient will benefit from skilled therapeutic intervention in order to improve the following deficits and impairments:  Pain, Decreased strength, Difficulty walking, Decreased mobility, Decreased activity tolerance  Visit Diagnosis: Pain in thoracic spine  Bilateral low back pain without sciatica, unspecified chronicity  Muscle weakness (generalized)  Abnormal posture     Problem List Patient Active Problem List   Diagnosis Date Noted  . DYSPEPSIA 05/26/2008  . ABDOMINAL PAIN, EPIGASTRIC 05/26/2008  . HYPOTHYROIDISM 05/21/2008  . DIVERTICULOSIS, COLON 05/21/2008  . UNSPECIFIED CONSTIPATION 05/21/2008  . STENOSIS OF RECTUM AND ANUS 05/21/2008  . FIBROSITIS 05/21/2008  . 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 HIRSCHSPRUNG'S DISEASE 05/21/2008     07/21/2008, PT, DPT 10/29/16 4:02 PM   [facility] [address]  [address] [place], [place], [place] Phone: [phone]   Fax:  [phone]  Name: CALLI BASHOR MRN: Charlotte Sanes Date of Birth: 1940/09/06

## 2016-11-01 ENCOUNTER — Ambulatory Visit: Payer: Medicare Other | Admitting: Physical Therapy

## 2016-11-01 DIAGNOSIS — M6281 Muscle weakness (generalized): Secondary | ICD-10-CM

## 2016-11-01 DIAGNOSIS — R293 Abnormal posture: Secondary | ICD-10-CM

## 2016-11-01 DIAGNOSIS — M545 Low back pain, unspecified: Secondary | ICD-10-CM

## 2016-11-01 DIAGNOSIS — M546 Pain in thoracic spine: Secondary | ICD-10-CM

## 2016-11-01 NOTE — Therapy (Addendum)
[facility] [address]  [place], [place], [address] Phone: [phone]   Fax:  [phone]  Physical Therapy Treatment  Patient Details  Name: Hailey Hughes MRN: [id number] Date of Birth: 09-22-1940 Referring Provider: Bing Matter, PA-C  Encounter Date: 11/01/2016      PT End of Session - 11/01/16 1020    Visit Number 5   Number of Visits 12   Date for PT Re-Evaluation 11/29/16   Authorization Type UHC Medicare   PT Start Time 1018   PT Stop Time 1057   PT Time Calculation (min) 39 min   Activity Tolerance Patient tolerated treatment well   Behavior During Therapy [facility] for tasks assessed/performed      Past Medical History:  Diagnosis Date  . Depression   . Diverticulosis   . DM2 (diabetes mellitus, type 2)   . Hiatal hernia   . HTN (hypertension)   . Hyperlipidemia   . Hypothyroidism   . Imperforate anus   . Obesity   . OSA (obstructive sleep apnea)   . Palpitations   . Reflux esophagitis     Past Surgical History:  Procedure Laterality Date  . APPENDECTOMY    . CARPAL TUNNEL RELEASE Right   . COLON RESECTION     age 76  . COLOSTOMY     age 89 due to imperforate anus  . CYSTECTOMY     spinal  . RECTOVAGINAL FISTULA CLOSURE     congenital    There were no vitals filed for this visit.      Subjective Assessment - 11/01/16 1019    Subjective back bothered her a little bit this morning - activity made it better   Pertinent History OSA, HTN   Patient Stated Goals improve pain and function   Currently in Pain? Yes   Pain Score 3    Pain Location Back   Pain Orientation Mid   Pain Descriptors / Indicators Aching;Sore   Pain Type Chronic pain                         [facility] Adult PT Treatment/Exercise - 11/01/16 1020      Neck Exercises: Theraband   Shoulder Extension 15 reps;Red   Shoulder Extension Limitations with scap squeeze   Horizontal ABduction 15 reps    Horizontal ABduction Limitations seated on blue disc   Other Theraband Exercises standing upright row - red tband x 15 reps     Neck Exercises: Standing   Other Standing Exercises flexion/abduction against pool noodle - 1# each hand x 15 reps each     Neck Exercises: Seated   Neck Retraction 15 reps;5 secs   Neck Retraction Limitations VC for form     Lumbar Exercises: Aerobic   Stationary Bike NuStep: lvl 6, 6 min - BUE/BLE     Lumbar Exercises: Seated   Long Arc Quad on Chair Strengthening;Both;15 reps   LAQ on Chair Weights (lbs) seated on blue disc - alternating kick outs - 2# at ankle   Hip Flexion on Ball Strengthening;Both;15 reps   Hip Flexion on Ball Limitations seated on blue disc - alternating marches with alternating UE flexion   Other Seated Lumbar Exercises row - seated on blue disc - red tband x 15 reps                     PT Long Term Goals - 10/22/16 1431  PT LONG TERM GOAL #1   Title patient to be independent with advanced HEP   Status On-going     PT LONG TERM GOAL #2   Title patient to report pain reduction by >/= 50% for greater than 2 weeks   Status On-going     PT LONG TERM GOAL #3   Title patient to demonstrate good postural awareness and body mechanics as needs for daily activities.   Status On-going     PT LONG TERM GOAL #4   Title patient to improve B LE strength to >/=4+/5 for improved pain and function   Status On-going     PT LONG TERM GOAL #5   Title patient to report ability to stand/walk >/= 1 hour without pain limiting   Status On-going               Plan - 11/01/16 1020    Clinical Impression Statement Patient today with reports of some mild back pain, however, does improve with activity. Patient doing well today with all mid back, hip and core strengthening today with no issue. patient making good progress towards goals.    PT Treatment/Interventions ADLs/Self Care Home Management;Cryotherapy;Electrical  Stimulation;Iontophoresis 96m/ml Dexamethasone;Moist Heat;Traction;Ultrasound;Neuromuscular re-education;Balance training;Therapeutic exercise;Therapeutic activities;Functional mobility training;Patient/family education;Manual techniques;Passive range of motion;Vasopneumatic Device;Taping;Dry needling   Consulted and Agree with Plan of Care Patient      Patient will benefit from skilled therapeutic intervention in order to improve the following deficits and impairments:  Pain, Decreased strength, Difficulty walking, Decreased mobility, Decreased activity tolerance  Visit Diagnosis: Pain in thoracic spine  Bilateral low back pain without sciatica, unspecified chronicity  Muscle weakness (generalized)  Abnormal posture     G-Codes    Functional Assessment Tool Used (Outpatient Only) Clinical judgement   Functional Limitation Mobility: Walking and moving around   Mobility: Walking and Moving Around Goal Status [phone] At least 40 percent but less than 60 percent impaired, limited or restricted   Mobility: Walking and Moving Around D/C Status (([id number] At least 40 percent but less than 60 percent impaired, limited or restricted     Problem List Patient Active Problem List   Diagnosis Date Noted  . DYSPEPSIA 05/26/2008  . ABDOMINAL PAIN, EPIGASTRIC 05/26/2008  . HYPOTHYROIDISM 05/21/2008  . DIVERTICULOSIS, COLON 05/21/2008  . UNSPECIFIED CONSTIPATION 05/21/2008  . STENOSIS OF RECTUM AND ANUS 05/21/2008  . FIBROSITIS 05/21/2008  . 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 HIRSCHSPRUNG'S DISEASE 05/21/2008     SLanney Gins PT, DPT 11/01/16 1:23 PM  PHYSICAL THERAPY DISCHARGE SUMMARY  Visits from Start of Care: 5  Current functional level related to goals / functional outcomes: See above   Remaining deficits: See above, did not return since last visit, unsure of current pain and functional limitations   Education / Equipment: HEP  Plan: Patient agrees to discharge.   Patient goals were not met. Patient is being discharged due to not returning since the last visit.  ?????    SLanney Gins PT, DPT 12/25/16 10:58 AM    [facility] [place] [address]Phone: [phone]  Fax:  [phone] Name: Hailey DEROSSETTMRN: [id number] of Birth: 4May 24, 1942

## 2017-06-03 ENCOUNTER — Other Ambulatory Visit: Payer: Self-pay | Admitting: Family Medicine

## 2017-06-03 ENCOUNTER — Ambulatory Visit
Admission: RE | Admit: 2017-06-03 | Discharge: 2017-06-03 | Disposition: A | Discharge status: Home / Self Care | Payer: Medicare Other | Source: Ambulatory Visit | Attending: Family Medicine | Admitting: Family Medicine

## 2017-06-03 DIAGNOSIS — R52 Pain, unspecified: Secondary | ICD-10-CM

## 2017-06-12 ENCOUNTER — Other Ambulatory Visit: Payer: Self-pay | Admitting: Family Medicine

## 2017-06-12 DIAGNOSIS — M5412 Radiculopathy, cervical region: Secondary | ICD-10-CM

## 2017-06-12 DIAGNOSIS — R937 Abnormal findings on diagnostic imaging of other parts of musculoskeletal system: Secondary | ICD-10-CM

## 2017-06-12 DIAGNOSIS — M542 Cervicalgia: Secondary | ICD-10-CM

## 2017-06-26 ENCOUNTER — Other Ambulatory Visit: Payer: Self-pay | Admitting: Family Medicine

## 2017-06-26 DIAGNOSIS — E2839 Other primary ovarian failure: Secondary | ICD-10-CM

## 2017-06-26 DIAGNOSIS — Z1231 Encounter for screening mammogram for malignant neoplasm of breast: Secondary | ICD-10-CM

## 2017-06-27 ENCOUNTER — Other Ambulatory Visit: Payer: Self-pay | Admitting: Family Medicine

## 2017-06-27 ENCOUNTER — Ambulatory Visit
Admission: RE | Admit: 2017-06-27 | Discharge: 2017-06-27 | Disposition: A | Discharge status: Home / Self Care | Payer: Medicare Other | Source: Ambulatory Visit | Attending: Family Medicine | Admitting: Family Medicine

## 2017-06-27 DIAGNOSIS — M545 Low back pain, unspecified: Secondary | ICD-10-CM

## 2017-06-27 DIAGNOSIS — G8929 Other chronic pain: Secondary | ICD-10-CM

## 2017-06-27 DIAGNOSIS — M546 Pain in thoracic spine: Secondary | ICD-10-CM

## 2017-06-27 DIAGNOSIS — M5412 Radiculopathy, cervical region: Secondary | ICD-10-CM

## 2017-06-27 DIAGNOSIS — R937 Abnormal findings on diagnostic imaging of other parts of musculoskeletal system: Secondary | ICD-10-CM

## 2017-06-27 DIAGNOSIS — K6289 Other specified diseases of anus and rectum: Secondary | ICD-10-CM

## 2017-06-27 DIAGNOSIS — M542 Cervicalgia: Secondary | ICD-10-CM

## 2017-07-10 ENCOUNTER — Ambulatory Visit: Payer: Medicare Other | Attending: Neurological Surgery | Admitting: Physical Therapy

## 2017-07-10 ENCOUNTER — Encounter: Payer: Self-pay | Admitting: Physical Therapy

## 2017-07-10 DIAGNOSIS — R293 Abnormal posture: Secondary | ICD-10-CM | POA: Diagnosis present

## 2017-07-10 DIAGNOSIS — R29898 Other symptoms and signs involving the musculoskeletal system: Secondary | ICD-10-CM | POA: Insufficient documentation

## 2017-07-10 DIAGNOSIS — M542 Cervicalgia: Secondary | ICD-10-CM | POA: Diagnosis present

## 2017-07-10 DIAGNOSIS — M6281 Muscle weakness (generalized): Secondary | ICD-10-CM | POA: Insufficient documentation

## 2017-07-10 NOTE — Patient Instructions (Signed)
AROM: Neck Rotation   Turn head slowly to look over one shoulder, then the other. Hold each position __3-5__ seconds. Repeat _10-15___ times per set. Do __2__ sets per session.   Axial Extension (Chin Tuck)   Pull chin in and lengthen back of neck. Hold __5__ seconds while counting out loud. Repeat __10-15__ times. Do __2__ sessions per day.  Scapular Retraction (Standing)   With arms at sides, pinch shoulder blades together. Repeat _15___ times per set. Do __2__ sets per session.

## 2017-07-10 NOTE — Therapy (Signed)
[facility] [address]  [address] [place], [place], [address] Phone: [phone]   Fax:  [phone]  Physical Therapy Evaluation  Patient Details  Name: Hailey Hughes MRN: [id number] Date of Birth: Apr 14, 1940 Referring Provider: Dr. Barnett Abu   Encounter Date: 07/10/2017  PT End of Session - 07/10/17 1100    Visit Number  1    Number of Visits  12    Date for PT Re-Evaluation  08/21/17    PT Start Time  1017    PT Stop Time  1054    PT Time Calculation (min)  37 min    Activity Tolerance  Patient tolerated treatment well    Behavior During Therapy  [facility] for tasks assessed/performed       Past Medical History:  Diagnosis Date  . Depression   . Diverticulosis   . DM2 (diabetes mellitus, type 2) (HCC)   . Hiatal hernia   . HTN (hypertension)   . Hyperlipidemia   . Hypothyroidism   . Imperforate anus   . Obesity   . OSA (obstructive sleep apnea)   . Palpitations   . Reflux esophagitis     Past Surgical History:  Procedure Laterality Date  . APPENDECTOMY    . CARPAL TUNNEL RELEASE Right   . COLON RESECTION     age 30  . COLOSTOMY     age 43 due to imperforate anus  . CYSTECTOMY     spinal  . RECTOVAGINAL FISTULA CLOSURE     congenital    There were no vitals filed for this visit.   Subjective Assessment - 07/10/17 1018    Subjective  patient reporting neck pain with numbness on R side of head along with headaches + pain down into neck and shoulder. N&T is not constant, but frequent. Also reporting L shoulder pain - difficulty findings a pain free posture as well as TTP. Paitent also reporting low back pain due to scoliosis. Reports pain with nearly every motion - most pain with R rotation. Denies N&T into arm and hand.     Pertinent History  HTN, OSA, DM, depression    Diagnostic tests  xray + MRI at [facility]: mild spinal stenosis    Patient Stated Goals  improve pain     Currently in Pain?  Yes     Pain Score  5     Pain Location  Neck + L shoulder - "easily 10/10"    Pain Orientation  Right;Left;Posterior    Pain Descriptors / Indicators  Dull;Headache;Tingling;Numbness L shoulder - sharp    Pain Type  Acute pain    Aggravating Factors   movement/motion    Pain Relieving Factors  pain medication         [facility] PT Assessment - 07/10/17 1026      Assessment   Medical Diagnosis  cervical radiculopathy at C6    Referring Provider  Dr. Barnett Abu    Onset Date/Surgical Date  -- "a few months"    Next MD Visit  prn    Prior Therapy  no      Precautions   Precautions  None      Restrictions   Weight Bearing Restrictions  No      Balance Screen   Has the patient fallen in the past 6 months  No    Has the patient had a decrease in activity level because of a fear of falling?   No  Is the patient reluctant to leave their home because of a fear of falling?   No      Home Public house manager residence    Living Arrangements  Spouse/significant other      Prior Function   Level of Independence  Independent    Vocation  Retired    Engineer, mining   Overall Cognitive Status  Within Functional Limits for tasks assessed      Observation/Other Assessments   Focus on Therapeutic Outcomes (FOTO)   Cervical Spine: 55 (45% limited, predicted 42% limited)      Sensation   Light Touch  Appears Intact      Coordination   Gross Motor Movements are Fluid and Coordinated  Yes      Posture/Postural Control   Posture/Postural Control  Postural limitations    Postural Limitations  Rounded Shoulders;Forward head      ROM / Strength   AROM / PROM / Strength  AROM;Strength      AROM   AROM Assessment Site  Cervical    Cervical Flexion  20 pain    Cervical Extension  35 no pain    Cervical - Right Side Bend  15    Cervical - Left Side Bend  17    Cervical - Right Rotation  19    Cervical - Left Rotation  34      Strength    Overall Strength Comments  excessive shoulder hiking with all active UE movements - pain with all MMT    Strength Assessment Site  Shoulder    Right/Left Shoulder  Right;Left    Right Shoulder Flexion  3+/5    Right Shoulder ABduction  3/5    Left Shoulder Flexion  3-/5    Left Shoulder ABduction  3-/5      Palpation   Palpation comment  TTP at B UT, B cervical paraspinals, B LS                Objective measurements completed on examination: See above findings.              PT Education - 07/10/17 1100    Education provided  Yes    Education Details  exam findings, POC, HEP    Person(s) Educated  Patient    Methods  Explanation;Demonstration;Handout    Comprehension  Verbalized understanding;Returned demonstration          PT Long Term Goals - 07/10/17 1321      PT LONG TERM GOAL #1   Title  patient to be independent with advanced HEP    Status  New    Target Date  08/21/17      PT LONG TERM GOAL #2   Title  patient to report pain reduction by >/= 50% for greater than 2 weeks    Status  New    Target Date  08/21/17      PT LONG TERM GOAL #3   Title  patient to demonstrate good postural awareness and body mechanics as needed for daily activities.    Status  New    Target Date  08/21/17      PT LONG TERM GOAL #4   Title  patient to demonstrate cervical AROM to WNL with pain no greater than 2/10    Status  New    Target Date  08/21/17      PT LONG TERM GOAL #5  Title  patient to improve B UE strength to >/= 4/5 for improved function    Status  New    Target Date  08/21/17             Plan - 07/10/17 1317    Clinical Impression Statement  Patient is a 77 y/o female presenting to [facility] today regarding primary complaints of neck and shoulder pain that has persisted for some time. Patient reporting pain with all cervical and UE motions as well as pain with all MMT. Patient with limited strength of UE, cervical and shoulder AROM, as well as  reduced postural awareness. Patient reporting use of soft collar and posture brace - of which helps in the short term, but has pain when doffing these devices, as expected. Patient today given HEP for gentle AROM and strengthening with good tolerance and carryover. Will plan for trial of mechanical traction at upcominging visits. Patient to benefit from skilled PT intervention to address pain and functional mobility limitations.     Clinical Presentation  Stable    Clinical Decision Making  Low    Rehab Potential  Good    PT Frequency  2x / week    PT Duration  6 weeks    PT Treatment/Interventions  ADLs/Self Care Home Management;Cryotherapy;Electrical Stimulation;Moist Heat;Traction;Therapeutic exercise;Therapeutic activities;Balance training;Neuromuscular re-education;Patient/family education;Manual techniques;Vasopneumatic Device;Taping;Dry needling;Passive range of motion    Consulted and Agree with Plan of Care  Patient       Patient will benefit from skilled therapeutic intervention in order to improve the following deficits and impairments:  Pain, Impaired UE functional use, Decreased range of motion, Decreased mobility, Decreased strength  Visit Diagnosis: Cervicalgia  Abnormal posture  Other symptoms and signs involving the musculoskeletal system  Muscle weakness (generalized)     Problem List Patient Active Problem List   Diagnosis Date Noted  . DYSPEPSIA 05/26/2008  . ABDOMINAL PAIN, EPIGASTRIC 05/26/2008  . HYPOTHYROIDISM 05/21/2008  . DIVERTICULOSIS, COLON 05/21/2008  . UNSPECIFIED CONSTIPATION 05/21/2008  . STENOSIS OF RECTUM AND ANUS 05/21/2008  . FIBROSITIS 05/21/2008  . 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 HIRSCHSPRUNG'S DISEASE 05/21/2008     Kipp Laurence, PT, DPT 07/10/17 1:24 PM   [facility] [address]  [address] [place], [place], [address] Phone: [phone]   Fax:  [phone]  Name: Hailey Hughes MRN: [id number] Date of Birth: 1940-08-29

## 2017-07-16 ENCOUNTER — Ambulatory Visit: Payer: Medicare Other | Attending: Neurological Surgery

## 2017-07-16 DIAGNOSIS — M545 Low back pain: Secondary | ICD-10-CM | POA: Diagnosis present

## 2017-07-16 DIAGNOSIS — R29898 Other symptoms and signs involving the musculoskeletal system: Secondary | ICD-10-CM | POA: Insufficient documentation

## 2017-07-16 DIAGNOSIS — R293 Abnormal posture: Secondary | ICD-10-CM

## 2017-07-16 DIAGNOSIS — M6281 Muscle weakness (generalized): Secondary | ICD-10-CM | POA: Insufficient documentation

## 2017-07-16 DIAGNOSIS — M546 Pain in thoracic spine: Secondary | ICD-10-CM | POA: Insufficient documentation

## 2017-07-16 DIAGNOSIS — M542 Cervicalgia: Secondary | ICD-10-CM | POA: Diagnosis not present

## 2017-07-16 NOTE — Therapy (Signed)
[facility] Outpatient Rehabilitation [facility] [address]  [address] [place], [place], [address] Phone: [phone]   Fax:  [phone]  Physical Therapy Treatment  Patient Details  Name: Hailey Hughes MRN: [id number] Date of Birth: 1940-11-18 Referring Provider: Dr. Barnett Abu   Encounter Date: 07/16/2017  PT End of Session - 07/16/17 1541    Visit Number  2    Number of Visits  12    Date for PT Re-Evaluation  08/21/17    PT Start Time  1535    PT Stop Time  1625    PT Time Calculation (min)  50 min    Activity Tolerance  Patient tolerated treatment well    Behavior During Therapy  [facility] for tasks assessed/performed       Past Medical History:  Diagnosis Date  . Depression   . Diverticulosis   . DM2 (diabetes mellitus, type 2) (HCC)   . Hiatal hernia   . HTN (hypertension)   . Hyperlipidemia   . Hypothyroidism   . Imperforate anus   . Obesity   . OSA (obstructive sleep apnea)   . Palpitations   . Reflux esophagitis     Past Surgical History:  Procedure Laterality Date  . APPENDECTOMY    . CARPAL TUNNEL RELEASE Right   . COLON RESECTION     age 77  . COLOSTOMY     age 89 due to imperforate anus  . CYSTECTOMY     spinal  . RECTOVAGINAL FISTULA CLOSURE     congenital    There were no vitals filed for this visit.  Subjective Assessment - 07/16/17 1537    Subjective  Pt. noting continued pain in R rotation at neck.      Pertinent History  HTN, OSA, DM, depression    Diagnostic tests  xray + MRI at [facility]: mild spinal stenosis    Patient Stated Goals  improve pain     Currently in Pain?  Yes    Pain Score  3     Pain Location  Neck    Pain Orientation  Right;Left    Pain Descriptors / Indicators  Dull    Pain Type  Acute pain                       [facility] Adult PT Treatment/Exercise - 07/16/17 1829      Exercises   Exercises  Neck      Neck Exercises: Seated   Neck Retraction  15 reps;5 secs    Other  Seated Exercise  Seated scap. retraction 5" x 10 reps       Neck Exercises: Supine   Neck Retraction  15 reps;5 secs    Cervical Rotation  Right;Left;10 reps      Modalities   Modalities  Traction      Traction   Type of Traction  Cervical    Min (lbs)  7    Max (lbs)  12    Hold Time  60    Rest Time  20    Time  10      Manual Therapy   Manual Therapy  Soft tissue mobilization;Passive ROM;Manual Traction;Myofascial release    Manual therapy comments  L sidelying with R LE elevated on bolster    Soft tissue mobilization  STM to B lumbar/thoracic paraspinals over areas of tenderness; most tenderness in L mid thoracic, and R medial scap. border     Myofascial  Release  TPR to L UT, suboccipital release     Passive ROM  Manual B UT, LS stretch    Manual Traction  Cervical manual traction 3 x 30 sec; no increased pain                   PT Long Term Goals - 07/16/17 1540      PT LONG TERM GOAL #1   Title  patient to be independent with advanced HEP    Status  On-going      PT LONG TERM GOAL #2   Title  patient to report pain reduction by >/= 50% for greater than 2 weeks    Status  On-going      PT LONG TERM GOAL #3   Title  patient to demonstrate good postural awareness and body mechanics as needed for daily activities.    Status  On-going      PT LONG TERM GOAL #4   Title  patient to demonstrate cervical AROM to WNL with pain no greater than 2/10    Status  On-going      PT LONG TERM GOAL #5   Title  patient to improve B UE strength to >/= 4/5 for improved function    Status  On-going            Plan - 07/16/17 1540    Clinical Impression Statement  Pt. doing well today.  Notes continued pain worst with R rotation at neck.  Addressed B UT, LS, cervical parapsinals tone/pain with manual therapy today with good response.  Trialed mechanical cervical traction with pt. as pt. noting some benefit from manual traction.  Will monitor response in upcoming  visits.      PT Treatment/Interventions  ADLs/Self Care Home Management;Cryotherapy;Electrical Stimulation;Moist Heat;Traction;Therapeutic exercise;Therapeutic activities;Balance training;Neuromuscular re-education;Patient/family education;Manual techniques;Vasopneumatic Device;Taping;Dry needling;Passive range of motion    Consulted and Agree with Plan of Care  Patient       Patient will benefit from skilled therapeutic intervention in order to improve the following deficits and impairments:  Pain, Impaired UE functional use, Decreased range of motion, Decreased mobility, Decreased strength  Visit Diagnosis: Cervicalgia  Abnormal posture  Other symptoms and signs involving the musculoskeletal system  Muscle weakness (generalized)     Problem List Patient Active Problem List   Diagnosis Date Noted  . DYSPEPSIA 05/26/2008  . ABDOMINAL PAIN, EPIGASTRIC 05/26/2008  . HYPOTHYROIDISM 05/21/2008  . DIVERTICULOSIS, COLON 05/21/2008  . UNSPECIFIED CONSTIPATION 05/21/2008  . STENOSIS OF RECTUM AND ANUS 05/21/2008  . FIBROSITIS 05/21/2008  . 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 HIRSCHSPRUNG'S DISEASE 05/21/2008    Kermit Balo, PTA 07/16/17 6:34 PM  [facility] Outpatient Rehabilitation [facility] [address]  [address] [place], [place], [address] Phone: [phone]   Fax:  [phone]  Name: Hailey Hughes MRN: [id number] Date of Birth: Aug 04, 1940

## 2017-07-18 ENCOUNTER — Ambulatory Visit: Payer: Medicare Other

## 2017-07-18 DIAGNOSIS — M545 Low back pain, unspecified: Secondary | ICD-10-CM

## 2017-07-18 DIAGNOSIS — M542 Cervicalgia: Secondary | ICD-10-CM

## 2017-07-18 DIAGNOSIS — M546 Pain in thoracic spine: Secondary | ICD-10-CM

## 2017-07-18 DIAGNOSIS — R29898 Other symptoms and signs involving the musculoskeletal system: Secondary | ICD-10-CM

## 2017-07-18 DIAGNOSIS — M6281 Muscle weakness (generalized): Secondary | ICD-10-CM

## 2017-07-18 DIAGNOSIS — R293 Abnormal posture: Secondary | ICD-10-CM

## 2017-07-18 NOTE — Therapy (Signed)
[facility] Outpatient [facility] [address]  [address] [place], [place], [address] Phone: [phone]   Fax:  [phone]  Physical Therapy Treatment  Patient Details  Name: Hailey Hughes MRN: [id number] Date of Birth: 1940-08-08 Referring Provider: Dr. Barnett Abu   Encounter Date: 07/18/2017  PT End of Session - 07/18/17 0939    Visit Number  3    Number of Visits  12    Date for PT Re-Evaluation  08/21/17    PT Start Time  0931    PT Stop Time  1030    PT Time Calculation (min)  59 min    Activity Tolerance  Patient tolerated treatment well    Behavior During Therapy  [facility] for tasks assessed/performed       Past Medical History:  Diagnosis Date  . Depression   . Diverticulosis   . DM2 (diabetes mellitus, type 2) (HCC)   . Hiatal hernia   . HTN (hypertension)   . Hyperlipidemia   . Hypothyroidism   . Imperforate anus   . Obesity   . OSA (obstructive sleep apnea)   . Palpitations   . Reflux esophagitis     Past Surgical History:  Procedure Laterality Date  . APPENDECTOMY    . CARPAL TUNNEL RELEASE Right   . COLON RESECTION     age 77  . COLOSTOMY     age 30 due to imperforate anus  . CYSTECTOMY     spinal  . RECTOVAGINAL FISTULA CLOSURE     congenital    There were no vitals filed for this visit.  Subjective Assessment - 07/18/17 0936    Subjective  Felt improvement in pain levels following last visit.      Pertinent History  HTN, OSA, DM, depression    Diagnostic tests  xray + MRI at [facility]: mild spinal stenosis    Patient Stated Goals  improve pain     Currently in Pain?  Yes    Pain Score  2     Pain Location  Neck    Pain Orientation  Right;Left    Pain Descriptors / Indicators  Dull    Pain Type  Acute pain    Pain Radiating Towards  into posterior base of skull    Pain Onset  More than a month ago    Pain Frequency  Intermittent    Aggravating Factors   Rotating head     Pain Relieving Factors  paind  medication                        [facility] Adult PT Treatment/Exercise - 07/18/17 0951      Neck Exercises: Machines for Strengthening   Nustep  UE/LE x 6 min; lvl 4      Neck Exercises: Theraband   Shoulder Extension  10 reps;Other (comment) yellow    Rows  10 reps;Red    Shoulder External Rotation  10 reps yellow TB     Shoulder External Rotation Limitations  doorseal + chin tuck + scap. retraction       Neck Exercises: Seated   Neck Retraction  15 reps;5 secs    Neck Retraction Limitations  + scap. retraction  at doorseal       Modalities   Modalities  Moist Heat      Moist Heat Therapy   Number Minutes Moist Heat  13 Minutes    Moist Heat Location  Shoulder;Cervical L  shoulder       Traction   Type of Traction  Cervical    Min (lbs)  10    Max (lbs)  15    Hold Time  60    Rest Time  20    Time  10      Manual Therapy   Manual Therapy  Soft tissue mobilization;Passive ROM;Manual Traction;Myofascial release    Manual therapy comments  hooklying    Soft tissue mobilization  STM to B cerivcal paraspinals, B UT, LS     Myofascial Release  TPR to L UT, suboccipital release     Passive ROM  Manual B UT, LS stretch x 30 sec     Manual Traction  Cervical manual traction 3 x 30 sec; no increased pain                   PT Long Term Goals - 07/16/17 1540      PT LONG TERM GOAL #1   Title  patient to be independent with advanced HEP    Status  On-going      PT LONG TERM GOAL #2   Title  patient to report pain reduction by >/= 50% for greater than 2 weeks    Status  On-going      PT LONG TERM GOAL #3   Title  patient to demonstrate good postural awareness and body mechanics as needed for daily activities.    Status  On-going      PT LONG TERM GOAL #4   Title  patient to demonstrate cervical AROM to WNL with pain no greater than 2/10    Status  On-going      PT LONG TERM GOAL #5   Title  patient to improve B UE strength to >/= 4/5 for  improved function    Status  On-going            Plan - 07/18/17 1213    Clinical Impression Statement  Pt. noting improvement in pain levels since last visit and attributes this to HEP and traction.  Progressed scapular/postural strengthening therex today, which was tolerated well.  Progressed cervical mechanical traction to 15#/10# pull today with pt. tolerating well and noting improved comfort upon leaving session.  Will monitor response at upcoming visit.      PT Treatment/Interventions  ADLs/Self Care Home Management;Cryotherapy;Electrical Stimulation;Moist Heat;Traction;Therapeutic exercise;Therapeutic activities;Balance training;Neuromuscular re-education;Patient/family education;Manual techniques;Vasopneumatic Device;Taping;Dry needling;Passive range of motion    PT Next Visit Plan  Continue traction per benefit     Consulted and Agree with Plan of Care  Patient       Patient will benefit from skilled therapeutic intervention in order to improve the following deficits and impairments:  Pain, Impaired UE functional use, Decreased range of motion, Decreased mobility, Decreased strength  Visit Diagnosis: Cervicalgia  Abnormal posture  Other symptoms and signs involving the musculoskeletal system  Muscle weakness (generalized)  Pain in thoracic spine  Bilateral low back pain without sciatica, unspecified chronicity     Problem List Patient Active Problem List   Diagnosis Date Noted  . DYSPEPSIA 05/26/2008  . ABDOMINAL PAIN, EPIGASTRIC 05/26/2008  . HYPOTHYROIDISM 05/21/2008  . DIVERTICULOSIS, COLON 05/21/2008  . UNSPECIFIED CONSTIPATION 05/21/2008  . STENOSIS OF RECTUM AND ANUS 05/21/2008  . FIBROSITIS 05/21/2008  . 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 HIRSCHSPRUNG'S DISEASE 05/21/2008    Kermit Balo, PTA 07/18/17 12:17 PM  [facility] [address]  [phone]  [place], [place], [address] Phone: [phone]    Fax:  [phone]  Name: OLAYINKA NOHL MRN: [id number] Date of Birth: 07-18-1940

## 2017-07-23 ENCOUNTER — Ambulatory Visit: Payer: Medicare Other | Admitting: Physical Therapy

## 2017-07-23 ENCOUNTER — Encounter: Payer: Self-pay | Admitting: Physical Therapy

## 2017-07-23 DIAGNOSIS — M542 Cervicalgia: Secondary | ICD-10-CM | POA: Diagnosis not present

## 2017-07-23 DIAGNOSIS — M6281 Muscle weakness (generalized): Secondary | ICD-10-CM

## 2017-07-23 DIAGNOSIS — R29898 Other symptoms and signs involving the musculoskeletal system: Secondary | ICD-10-CM

## 2017-07-23 DIAGNOSIS — R293 Abnormal posture: Secondary | ICD-10-CM

## 2017-07-23 NOTE — Therapy (Signed)
[facility] [address]  [address] [place], [place], [address] Phone: [phone]   Fax:  [phone]  Physical Therapy Treatment  Patient Details  Name: Hailey Hughes MRN: [id number] Date of Birth: 04-29-1940 Referring Provider: Dr. Barnett Abu   Encounter Date: 07/23/2017  PT End of Session - 07/23/17 0934    Visit Number  4    Number of Visits  12    Date for PT Re-Evaluation  08/21/17    PT Start Time  0934    PT Stop Time  1038    PT Time Calculation (min)  64 min    Activity Tolerance  Patient tolerated treatment well    Behavior During Therapy  [facility] for tasks assessed/performed       Past Medical History:  Diagnosis Date  . Depression   . Diverticulosis   . DM2 (diabetes mellitus, type 2) (HCC)   . Hiatal hernia   . HTN (hypertension)   . Hyperlipidemia   . Hypothyroidism   . Imperforate anus   . Obesity   . OSA (obstructive sleep apnea)   . Palpitations   . Reflux esophagitis     Past Surgical History:  Procedure Laterality Date  . APPENDECTOMY    . CARPAL TUNNEL RELEASE Right   . COLON RESECTION     age 60  . COLOSTOMY     age 51 due to imperforate anus  . CYSTECTOMY     spinal  . RECTOVAGINAL FISTULA CLOSURE     congenital    There were no vitals filed for this visit.  Subjective Assessment - 07/23/17 0939    Subjective  Pt states she got discouraged over the weekend when her neck & head "got to hurting as bad as in the beginning" after spending some time working on the computer.    Pertinent History  HTN, OSA, DM, depression    Diagnostic tests  xray + MRI at [facility]: mild spinal stenosis    Patient Stated Goals  improve pain     Currently in Pain?  Yes    Pain Score  3     Pain Location  Neck & shoulders    Pain Orientation  Right;Left    Pain Descriptors / Indicators  Dull;Sharp    Pain Type  Acute pain    Pain Radiating Towards  numbness into R posterior skull    Pain  Frequency  Intermittent                       [facility] Adult PT Treatment/Exercise - 07/23/17 0934      Self-Care   Self-Care  Posture    Posture  Provided education in neutral spine posture and proper boby mechanics with typical daily positioning and household activities to minimize neck and shoulder strain - particular attention paid to use of computer as pt reporting pain flare-up over the weekend after using computer.      Exercises   Exercises  Neck      Neck Exercises: Machines for Strengthening   UBE (Upper Arm Bike)  L1.5 x 6' (3' fwd/3'back)      Neck Exercises: Seated   Neck Retraction  10 reps;5 secs    Neck Retraction Limitations  cues to avoid neck flexion      Modalities   Modalities  Traction;Moist Heat      Moist Heat Therapy   Number Minutes Moist Heat  15 Minutes  Moist Heat Location  Shoulder;Cervical L shoulder       Traction   Min (lbs)  12    Max (lbs)  17    Hold Time  60    Rest Time  20    Time  10      Neck Exercises: Stretches   Upper Trapezius Stretch  Right;Left;30 seconds;2 reps    Levator Stretch  Right;Left;30 seconds;2 reps             PT Education - 07/23/17 1038    Education provided  Yes    Education Details  Posture & body mechanics; HEP update - neck stretches    Person(s) Educated  Patient    Methods  Explanation;Demonstration;Handout    Comprehension  Verbalized understanding;Returned demonstration          PT Long Term Goals - 07/16/17 1540      PT LONG TERM GOAL #1   Title  patient to be independent with advanced HEP    Status  On-going      PT LONG TERM GOAL #2   Title  patient to report pain reduction by >/= 50% for greater than 2 weeks    Status  On-going      PT LONG TERM GOAL #3   Title  patient to demonstrate good postural awareness and body mechanics as needed for daily activities.    Status  On-going      PT LONG TERM GOAL #4   Title  patient to demonstrate cervical AROM to WNL  with pain no greater than 2/10    Status  On-going      PT LONG TERM GOAL #5   Title  patient to improve B UE strength to >/= 4/5 for improved function    Status  On-going            Plan - 07/23/17 1038    Clinical Impression Statement  Hailey Hughes reporting a significant flare-up of her neck and shoulder pain back to baseline levels after using the laptop computer over the weekend, therefore majority of today's visit targeting awareness of neutral spine and shoulder posture as well as proper body mechanics for typicall daily household tasks, emphaisizing positioning and alignment of laptop computer. Pt acknowledging understanding of training and feels liek she can incorporate some of the suggested positioning into her daily activities. Pt noting continued tightness in upper shoulders and neck, therefore introduced UT & LS stretches for HEP.    PT Treatment/Interventions  ADLs/Self Care Home Management;Cryotherapy;Electrical Stimulation;Moist Heat;Traction;Therapeutic exercise;Therapeutic activities;Balance training;Neuromuscular re-education;Patient/family education;Manual techniques;Vasopneumatic Device;Taping;Dry needling;Passive range of motion    PT Next Visit Plan  Continue traction per benefit     Consulted and Agree with Plan of Care  Patient       Patient will benefit from skilled therapeutic intervention in order to improve the following deficits and impairments:  Pain, Impaired UE functional use, Decreased range of motion, Decreased mobility, Decreased strength  Visit Diagnosis: Cervicalgia  Abnormal posture  Other symptoms and signs involving the musculoskeletal system  Muscle weakness (generalized)     Problem List Patient Active Problem List   Diagnosis Date Noted  . DYSPEPSIA 05/26/2008  . ABDOMINAL PAIN, EPIGASTRIC 05/26/2008  . HYPOTHYROIDISM 05/21/2008  . DIVERTICULOSIS, COLON 05/21/2008  . UNSPECIFIED CONSTIPATION 05/21/2008  . STENOSIS OF RECTUM AND ANUS  05/21/2008  . FIBROSITIS 05/21/2008  . 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 HIRSCHSPRUNG'S DISEASE 05/21/2008    Marry Guan, PT, MPT 07/23/2017, 12:32 PM  [facility] [address]  [address] [place], [place], [address] Phone: [phone]   Fax:  [phone]  Name: Hailey Hughes MRN: [id number] Date of Birth: 1940/07/30

## 2017-07-23 NOTE — Patient Instructions (Addendum)

## 2017-07-25 ENCOUNTER — Encounter: Payer: Self-pay | Admitting: Physical Therapy

## 2017-07-25 ENCOUNTER — Ambulatory Visit: Payer: Medicare Other | Admitting: Physical Therapy

## 2017-07-25 DIAGNOSIS — M6281 Muscle weakness (generalized): Secondary | ICD-10-CM

## 2017-07-25 DIAGNOSIS — R29898 Other symptoms and signs involving the musculoskeletal system: Secondary | ICD-10-CM

## 2017-07-25 DIAGNOSIS — R293 Abnormal posture: Secondary | ICD-10-CM

## 2017-07-25 DIAGNOSIS — M542 Cervicalgia: Secondary | ICD-10-CM

## 2017-07-25 NOTE — Therapy (Signed)
[facility] Outpatient Rehabilitation [facility] [address]  [address] [place], [place], [address] Phone: [phone]   Fax:  [phone]  Physical Therapy Treatment  Patient Details  Name: Hailey Hughes MRN: [id number] Date of Birth: 1940-05-15 Referring Provider: Dr. Barnett Abu   Encounter Date: 07/25/2017  PT End of Session - 07/25/17 0930    Visit Number  5    Number of Visits  12    Date for PT Re-Evaluation  08/21/17    PT Start Time  0930    PT Stop Time  1032    PT Time Calculation (min)  62 min    Activity Tolerance  Patient tolerated treatment well    Behavior During Therapy  [facility] for tasks assessed/performed       Past Medical History:  Diagnosis Date  . Depression   . Diverticulosis   . DM2 (diabetes mellitus, type 2) (HCC)   . Hiatal hernia   . HTN (hypertension)   . Hyperlipidemia   . Hypothyroidism   . Imperforate anus   . Obesity   . OSA (obstructive sleep apnea)   . Palpitations   . Reflux esophagitis     Past Surgical History:  Procedure Laterality Date  . APPENDECTOMY    . CARPAL TUNNEL RELEASE Right   . COLON RESECTION     age 77  . COLOSTOMY     age 77 due to imperforate anus  . CYSTECTOMY     spinal  . RECTOVAGINAL FISTULA CLOSURE     congenital    There were no vitals filed for this visit.  Subjective Assessment - 07/25/17 0933    Subjective  Pt reports her shoulder was hurting a lot - no known triggering event. Pain better this morning.    Pertinent History  HTN, OSA, DM, depression    Diagnostic tests  xray + MRI at [facility]: mild spinal stenosis    Patient Stated Goals  improve pain     Currently in Pain?  No/denies    Pain Score  0-No pain                       [facility] Adult PT Treatment/Exercise - 07/25/17 0930      Exercises   Exercises  Neck      Neck Exercises: Machines for Strengthening   UBE (Upper Arm Bike)  L2.0 x 6' (3' fwd/3'back)      Neck Exercises: Theraband    Shoulder External Rotation Limitations  attempted in hooklying but deferred d/t increased pain    Horizontal ABduction  10 reps yellow Tb    Horizontal ABduction Limitations  hooklying      Traction   Type of Traction  Cervical    Min (lbs)  15    Max (lbs)  20    Hold Time  60    Rest Time  20    Time  10      Manual Therapy   Manual Therapy  Soft tissue mobilization;Myofascial release;Passive ROM    Manual therapy comments  hooklying    Soft tissue mobilization  B UT & LS (L>R), L pecs, L supraspinatus & infraspinatus    Myofascial Release  manual TPR to L UT, supraspinatus & pec minor    Passive ROM  manual B shoulder depression & retraction, B UT, LS stretch x 30 sec       Neck Exercises: Marine scientist  30 seconds;1  rep    Research officer, political party Limitations  3 way doorway stretch - limited reach in high position    Other Neck Stretches  hooklying "snow angel" pec stretch varying arm position as tolerated 2 x 60"                  PT Long Term Goals - 07/16/17 1540      PT LONG TERM GOAL #1   Title  patient to be independent with advanced HEP    Status  On-going      PT LONG TERM GOAL #2   Title  patient to report pain reduction by >/= 50% for greater than 2 weeks    Status  On-going      PT LONG TERM GOAL #3   Title  patient to demonstrate good postural awareness and body mechanics as needed for daily activities.    Status  On-going      PT LONG TERM GOAL #4   Title  patient to demonstrate cervical AROM to WNL with pain no greater than 2/10    Status  On-going      PT LONG TERM GOAL #5   Title  patient to improve B UE strength to >/= 4/5 for improved function    Status  On-going            Plan - 07/25/17 0937    Clinical Impression Statement  Hailey Hughes continues to report fluctuating pain w/o identifiable triggering events. Initial focus on manual therapy to address increased muscle tension and TPs with pt noting improved comfort following  manual therapy and improved ability to lay on her back with arms at her sides (previously limited by pain necessitating arm propped up on pillow). Initiated scapular strengthening exercises to promote maintenance of imporving posture but limited tolerance for B theraband resisted ER d/t L shoulder pain. Treatment session concluded with mechanical traction as pt continues to note benefit from this.    PT Treatment/Interventions  ADLs/Self Care Home Management;Cryotherapy;Electrical Stimulation;Moist Heat;Traction;Therapeutic exercise;Therapeutic activities;Balance training;Neuromuscular re-education;Patient/family education;Manual techniques;Vasopneumatic Device;Taping;Dry needling;Passive range of motion    PT Next Visit Plan  Continue traction per benefit     Consulted and Agree with Plan of Care  Patient       Patient will benefit from skilled therapeutic intervention in order to improve the following deficits and impairments:  Pain, Impaired UE functional use, Decreased range of motion, Decreased mobility, Decreased strength  Visit Diagnosis: Cervicalgia  Abnormal posture  Other symptoms and signs involving the musculoskeletal system  Muscle weakness (generalized)     Problem List Patient Active Problem List   Diagnosis Date Noted  . DYSPEPSIA 05/26/2008  . ABDOMINAL PAIN, EPIGASTRIC 05/26/2008  . HYPOTHYROIDISM 05/21/2008  . DIVERTICULOSIS, COLON 05/21/2008  . UNSPECIFIED CONSTIPATION 05/21/2008  . STENOSIS OF RECTUM AND ANUS 05/21/2008  . FIBROSITIS 05/21/2008  . 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 HIRSCHSPRUNG'S DISEASE 05/21/2008    Marry Guan, PT, MPT 07/25/2017, 10:42 AM  [facility] [address]  [address] [place], [place], [address] Phone: [phone]   Fax:  [phone]  Name: Hailey Hughes MRN: [id number] Date of Birth: 1942/79/31

## 2017-07-30 ENCOUNTER — Ambulatory Visit: Payer: Medicare Other | Admitting: Physical Therapy

## 2017-08-01 ENCOUNTER — Ambulatory Visit: Payer: Medicare Other

## 2017-08-01 DIAGNOSIS — M542 Cervicalgia: Secondary | ICD-10-CM | POA: Diagnosis not present

## 2017-08-01 DIAGNOSIS — M6281 Muscle weakness (generalized): Secondary | ICD-10-CM

## 2017-08-01 DIAGNOSIS — R29898 Other symptoms and signs involving the musculoskeletal system: Secondary | ICD-10-CM

## 2017-08-01 DIAGNOSIS — R293 Abnormal posture: Secondary | ICD-10-CM

## 2017-08-01 NOTE — Therapy (Signed)
[facility] Outpatient Rehabilitation [facility] [address]  [address] [place], [place], [address] Phone: [phone]   Fax:  [phone]  Physical Therapy Treatment  Patient Details  Name: Hailey Hughes MRN: [id number] Date of Birth: 09-23-1940 Referring Provider: Dr. Barnett Abu   Encounter Date: 08/01/2017  PT End of Session - 08/01/17 1021    Visit Number  6    Number of Visits  12    Date for PT Re-Evaluation  08/21/17    PT Start Time  1017    PT Stop Time  1120    PT Time Calculation (min)  63 min    Activity Tolerance  Patient tolerated treatment well    Behavior During Therapy  [facility] for tasks assessed/performed       Past Medical History:  Diagnosis Date  . Depression   . Diverticulosis   . DM2 (diabetes mellitus, type 2) (HCC)   . Hiatal hernia   . HTN (hypertension)   . Hyperlipidemia   . Hypothyroidism   . Imperforate anus   . Obesity   . OSA (obstructive sleep apnea)   . Palpitations   . Reflux esophagitis     Past Surgical History:  Procedure Laterality Date  . APPENDECTOMY    . CARPAL TUNNEL RELEASE Right   . COLON RESECTION     age 75  . COLOSTOMY     age 51 due to imperforate anus  . CYSTECTOMY     spinal  . RECTOVAGINAL FISTULA CLOSURE     congenital    There were no vitals filed for this visit.  Subjective Assessment - 08/01/17 1021    Subjective  Pt. reporting she still has intermittent rises in neck and shoulder pain without known trigger.     Pertinent History  HTN, OSA, DM, depression    Diagnostic tests  xray + MRI at [facility]: mild spinal stenosis    Patient Stated Goals  improve pain     Currently in Pain?  No/denies    Pain Score  0-No pain    Multiple Pain Sites  No                       [facility] Adult PT Treatment/Exercise - 08/01/17 1030      Neck Exercises: Machines for Strengthening   UBE (Upper Arm Bike)  L2.0 x 6' (3' fwd/3'back)      Moist Heat Therapy   Number Minutes Moist  Heat  15 Minutes    Moist Heat Location  Shoulder;Cervical L shoulder per pt. request       Traction   Min (lbs)  16    Max (lbs)  21    Hold Time  60    Rest Time  20    Time  12      Manual Therapy   Manual Therapy  Soft tissue mobilization;Myofascial release;Passive ROM    Soft tissue mobilization  STM to L triceps, L lateral deltoid, L UT     Myofascial Release  manual TPR to L UT, L proximal triceps in area of most tenderness     Passive ROM  manual B UT, LS stretch x 30 sec       Neck Exercises: Stretches   Upper Trapezius Stretch  Right;Left;30 seconds;2 reps    Levator Stretch  Right;Left;30 seconds;2 reps    Corner Stretch  30 seconds;1 rep    Corner Stretch Limitations  3 way doorway stretch -  limited reach in high position                  PT Long Term Goals - 07/16/17 1540      PT LONG TERM GOAL #1   Title  patient to be independent with advanced HEP    Status  On-going      PT LONG TERM GOAL #2   Title  patient to report pain reduction by >/= 50% for greater than 2 weeks    Status  On-going      PT LONG TERM GOAL #3   Title  patient to demonstrate good postural awareness and body mechanics as needed for daily activities.    Status  On-going      PT LONG TERM GOAL #4   Title  patient to demonstrate cervical AROM to WNL with pain no greater than 2/10    Status  On-going      PT LONG TERM GOAL #5   Title  patient to improve B UE strength to >/= 4/5 for improved function    Status  On-going            Plan - 08/01/17 1024    Clinical Impression Statement  Shavanna seen to start session noting some relief from pain levels following last visit however noting neck and L shoulder pain has returned intermittently over the past two days without known trigger.  Manual therapy addressing continued L shoulder/neck tenderness today with some relief noted following this however still with limited tolerance for supine positioning with L arm resting on table  today.  Ended session with mild progression of cervical traction, which was well tolerated as pt. noting continued benefit from traction.  Pt. noting some improvement in radicular symptoms and overall pain levels since starting therapy however still with difficulty identifying triggering events for periodic increases in neck pain.  Will continue to progress toward goals.      PT Treatment/Interventions  ADLs/Self Care Home Management;Cryotherapy;Electrical Stimulation;Moist Heat;Traction;Therapeutic exercise;Therapeutic activities;Balance training;Neuromuscular re-education;Patient/family education;Manual techniques;Vasopneumatic Device;Taping;Dry needling;Passive range of motion    PT Next Visit Plan  Continue traction per benefit     Consulted and Agree with Plan of Care  Patient       Patient will benefit from skilled therapeutic intervention in order to improve the following deficits and impairments:  Pain, Impaired UE functional use, Decreased range of motion, Decreased mobility, Decreased strength  Visit Diagnosis: Cervicalgia  Abnormal posture  Other symptoms and signs involving the musculoskeletal system  Muscle weakness (generalized)     Problem List Patient Active Problem List   Diagnosis Date Noted  . DYSPEPSIA 05/26/2008  . ABDOMINAL PAIN, EPIGASTRIC 05/26/2008  . HYPOTHYROIDISM 05/21/2008  . DIVERTICULOSIS, COLON 05/21/2008  . UNSPECIFIED CONSTIPATION 05/21/2008  . STENOSIS OF RECTUM AND ANUS 05/21/2008  . FIBROSITIS 05/21/2008  . 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 HIRSCHSPRUNG'S DISEASE 05/21/2008    Kermit Balo, PTA 08/01/17 12:46 PM   [facility] [address]  [address] [place], [place], [address] Phone: [phone]   Fax:  [phone]  Name: Hailey Hughes MRN: [id number] Date of Birth: 10/11/1940

## 2017-08-06 ENCOUNTER — Ambulatory Visit: Payer: Medicare Other

## 2017-08-06 DIAGNOSIS — M542 Cervicalgia: Secondary | ICD-10-CM | POA: Diagnosis not present

## 2017-08-06 DIAGNOSIS — M6281 Muscle weakness (generalized): Secondary | ICD-10-CM

## 2017-08-06 DIAGNOSIS — R293 Abnormal posture: Secondary | ICD-10-CM

## 2017-08-06 DIAGNOSIS — R29898 Other symptoms and signs involving the musculoskeletal system: Secondary | ICD-10-CM

## 2017-08-06 NOTE — Therapy (Signed)
[facility] Outpatient Rehabilitation [facility] [address]  [address] [place], [place], [address] Phone: [phone]   Fax:  [phone]  Physical Therapy Treatment  Patient Details  Name: Hailey Hughes MRN: [id number] Date of Birth: [birth year] Referring Provider: Dr. Barnett Abu   Encounter Date: 08/06/2017  PT End of Session - 08/06/17 0946    Visit Number  7    Number of Visits  12    Date for PT Re-Evaluation  08/21/17    PT Start Time  0941 Pt. arrived to session late    PT Stop Time  1032    PT Time Calculation (min)  51 min    Activity Tolerance  Patient tolerated treatment well    Behavior During Therapy  [facility] for tasks assessed/performed       Past Medical History:  Diagnosis Date  . Depression   . Diverticulosis   . DM2 (diabetes mellitus, type 2) (HCC)   . Hiatal hernia   . HTN (hypertension)   . Hyperlipidemia   . Hypothyroidism   . Imperforate anus   . Obesity   . OSA (obstructive sleep apnea)   . Palpitations   . Reflux esophagitis     Past Surgical History:  Procedure Laterality Date  . APPENDECTOMY    . CARPAL TUNNEL RELEASE Right   . COLON RESECTION     age 43  . COLOSTOMY     age 4 due to imperforate anus  . CYSTECTOMY     spinal  . RECTOVAGINAL FISTULA CLOSURE     congenital    There were no vitals filed for this visit.  Subjective Assessment - 08/06/17 0944    Subjective  Pt. noting L shoulder continues to bother her and has not had much recent relief.  LBP today without known trigger which she reports happens periodically.      Pertinent History  HTN, OSA, DM, depression    Diagnostic tests  xray + MRI at [facility]: mild spinal stenosis    Patient Stated Goals  improve pain     Currently in Pain?  Yes    Pain Score  4     Pain Location  Neck    Pain Descriptors / Indicators  Tingling    Pain Type  Acute pain    Pain Onset  More than a month ago    Pain Frequency  Intermittent    Aggravating Factors    Rotating head     Pain Relieving Factors  pain medication     Multiple Pain Sites  No                       [facility] Adult PT Treatment/Exercise - 08/06/17 1203      Neck Exercises: Machines for Strengthening   UBE (Upper Arm Bike)  L2.0 x 6' (3' fwd/3'back)      Neck Exercises: Theraband   Shoulder Extension  10 reps;Other (comment) yellow TB    Rows  Red;15 reps added to HEP       Traction   Type of Traction  Cervical    Min (lbs)  15    Max (lbs)  20    Hold Time  60    Rest Time  20    Time  12      Manual Therapy   Manual Therapy  Soft tissue mobilization;Myofascial release;Passive ROM    Manual therapy comments  hooklying    Soft tissue  mobilization  STM to L triceps, biceps, anterior/lateral/posterior deltoid, L UT, LS, L supraspinatus, infra     Myofascial Release  Manual TPR to L biceps, triceps, lateral deltoid, supraspinatus, L UT; good twitch response in all locations however ttp     Passive ROM  manual B UT, LS stretch x 30 sec              PT Education - 08/06/17 1201    Education provided  Yes    Education Details  row with yellow TB issued to pt.     Person(s) Educated  Patient    Methods  Explanation;Demonstration;Verbal cues;Handout    Comprehension  Verbalized understanding;Returned demonstration;Verbal cues required;Need further instruction          PT Long Term Goals - 07/16/17 1540      PT LONG TERM GOAL #1   Title  patient to be independent with advanced HEP    Status  On-going      PT LONG TERM GOAL #2   Title  patient to report pain reduction by >/= 50% for greater than 2 weeks    Status  On-going      PT LONG TERM GOAL #3   Title  patient to demonstrate good postural awareness and body mechanics as needed for daily activities.    Status  On-going      PT LONG TERM GOAL #4   Title  patient to demonstrate cervical AROM to WNL with pain no greater than 2/10    Status  On-going      PT LONG TERM GOAL #5   Title   patient to improve B UE strength to >/= 4/5 for improved function    Status  On-going            Plan - 08/06/17 0950    Clinical Impression Statement  Hailey Hughes reporting continued L shoulder/upper arm pain, which is her primary concern today.  Session focused on manual therapy as pt. with multiple TP's and tenderness in L shoulder complex, L triceps, biceps, L UT, LS today which was addressed with STM/TPR with good relief noted from pt. following.  Remainder of visit focused on scapular strengthening and HEP update.  Traction pull reduce to 20# max today per pt. request as she noted better tolerance for this reduce pull two visits ago.  Ended session with pt. noting good relief from L UE/shoulder pain.    PT Treatment/Interventions  ADLs/Self Care Home Management;Cryotherapy;Electrical Stimulation;Moist Heat;Traction;Therapeutic exercise;Therapeutic activities;Balance training;Neuromuscular re-education;Patient/family education;Manual techniques;Vasopneumatic Device;Taping;Dry needling;Passive range of motion    PT Next Visit Plan  Continue traction per benefit     Consulted and Agree with Plan of Care  Patient       Patient will benefit from skilled therapeutic intervention in order to improve the following deficits and impairments:  Pain, Impaired UE functional use, Decreased range of motion, Decreased mobility, Decreased strength  Visit Diagnosis: Cervicalgia  Abnormal posture  Other symptoms and signs involving the musculoskeletal system  Muscle weakness (generalized)     Problem List Patient Active Problem List   Diagnosis Date Noted  . DYSPEPSIA 05/26/2008  . ABDOMINAL PAIN, EPIGASTRIC 05/26/2008  . HYPOTHYROIDISM 05/21/2008  . DIVERTICULOSIS, COLON 05/21/2008  . UNSPECIFIED CONSTIPATION 05/21/2008  . STENOSIS OF RECTUM AND ANUS 05/21/2008  . FIBROSITIS 05/21/2008  . 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 HIRSCHSPRUNG'S DISEASE 05/21/2008    Kermit Balo, PTA 08/06/17 12:15  PM   [facility] [address]  [place] [address]  [address] [place], [place], [address] Phone: [phone]   Fax:  [phone]  Name: Hailey Hughes MRN: [id number] Date of Birth: 01-Feb-1941

## 2017-08-08 ENCOUNTER — Ambulatory Visit: Payer: Medicare Other | Admitting: Physical Therapy

## 2017-08-08 ENCOUNTER — Encounter: Payer: Self-pay | Admitting: Physical Therapy

## 2017-08-08 DIAGNOSIS — M542 Cervicalgia: Secondary | ICD-10-CM | POA: Diagnosis not present

## 2017-08-08 DIAGNOSIS — R293 Abnormal posture: Secondary | ICD-10-CM

## 2017-08-08 DIAGNOSIS — M6281 Muscle weakness (generalized): Secondary | ICD-10-CM

## 2017-08-08 DIAGNOSIS — R29898 Other symptoms and signs involving the musculoskeletal system: Secondary | ICD-10-CM

## 2017-08-08 NOTE — Therapy (Signed)
[facility] [address]  [address] [place], [place], [address] Phone: [phone]   Fax:  [phone]  Physical Therapy Treatment  Patient Details  Name: Hailey Hughes MRN: [id number] Date of Birth: 04-Jan-1941 Referring Provider: Dr. Barnett Abu   Encounter Date: 08/08/2017  PT End of Session - 08/08/17 1025    Visit Number  8    Number of Visits  12    Date for PT Re-Evaluation  08/21/17    PT Start Time  1025 pt arrived late    PT Stop Time  1120    PT Time Calculation (min)  55 min    Activity Tolerance  Patient tolerated treatment well    Behavior During Therapy  [facility] for tasks assessed/performed       Past Medical History:  Diagnosis Date  . Depression   . Diverticulosis   . DM2 (diabetes mellitus, type 2) (HCC)   . Hiatal hernia   . HTN (hypertension)   . Hyperlipidemia   . Hypothyroidism   . Imperforate anus   . Obesity   . OSA (obstructive sleep apnea)   . Palpitations   . Reflux esophagitis     Past Surgical History:  Procedure Laterality Date  . APPENDECTOMY    . CARPAL TUNNEL RELEASE Right   . COLON RESECTION     age 77  . COLOSTOMY     age 41 due to imperforate anus  . CYSTECTOMY     spinal  . RECTOVAGINAL FISTULA CLOSURE     congenital    There were no vitals filed for this visit.  Subjective Assessment - 08/08/17 1027    Subjective  Pt reports she has recently stopped taking her Celebrex due to changes in kidney function while awaiting appt with nephrologist in Aug 2019 - feels like this has made her back pain more of an issue.    Pertinent History  HTN, OSA, DM, depression    Diagnostic tests  xray + MRI at [facility]: mild spinal stenosis    Patient Stated Goals  improve pain     Currently in Pain?  Yes    Pain Score  5  4-5/10    Pain Location  Neck & shoulders    Pain Orientation  Right;Left;Lower L shoulder > R    Pain Descriptors / Indicators  Sharp    Pain Type  Acute pain                        [facility] Adult PT Treatment/Exercise - 08/08/17 1025      Exercises   Exercises  Neck      Neck Exercises: Machines for Strengthening   UBE (Upper Arm Bike)  L2.5 x 6' (3' fwd/3'back)      Neck Exercises: Theraband   Shoulder External Rotation  10 reps yellow TB    Shoulder External Rotation Limitations  hooklying - better tolerance (no increased pain) today    Horizontal ABduction  15 reps yellow TB    Horizontal ABduction Limitations  hooklying    Other Theraband Exercises  Alt UE diagonals with yellow TB in hooklying x 10      Neck Exercises: Supine   Neck Retraction  15 reps;5 secs      Modalities   Modalities  Electrical Stimulation;Moist Heat      Moist Heat Therapy   Number Minutes Moist Heat  15 Minutes    Moist Heat Location  Cervical;Shoulder      Programme researcher, broadcasting/film/video Location  B cervical parapspinals & UT/LS    Electrical Stimulation Action  IFC    Electrical Stimulation Parameters  80-150 Hz, intensity to pt tolerance x 15'    Electrical Stimulation Goals  Pain;Tone      Manual Therapy   Manual Therapy  Soft tissue mobilization;Myofascial release;Passive ROM;Manual Traction    Manual therapy comments  hooklying    Soft tissue mobilization  B UT & LS (L>R), L pecs    Myofascial Release  manual TPR to L UT, biceps & pec minor    Passive ROM  manual B shoulder depression & retraction, B UT, LS stretch x 30 sec     Manual Traction  manual cervical distraction with slight flexion 2 x 30 sec                  PT Long Term Goals - 07/16/17 1540      PT LONG TERM GOAL #1   Title  patient to be independent with advanced HEP    Status  On-going      PT LONG TERM GOAL #2   Title  patient to report pain reduction by >/= 50% for greater than 2 weeks    Status  On-going      PT LONG TERM GOAL #3   Title  patient to demonstrate good postural awareness and body mechanics as needed for daily  activities.    Status  On-going      PT LONG TERM GOAL #4   Title  patient to demonstrate cervical AROM to WNL with pain no greater than 2/10    Status  On-going      PT LONG TERM GOAL #5   Title  patient to improve B UE strength to >/= 4/5 for improved function    Status  On-going            Plan - 08/08/17 1106    Clinical Impression Statement  Patient repoting some improvment in L shouder/UE pain following last session but still experiencing moderate level overall neck & B shoulder pain. Fwd head and rounded shoulder posture persists, L > R, with increased muscle tension remaining in pecs and upper shoulder musculature - reports limited tolerance for doorway pec stretch therefore focused on stretching with manual overpressure and STM in hooklying followed by scapular activation/strengthening to promote carryover of postural corrections. Treament conlcuded with trial of estim and moist heat in place of traction to promote further muscle relaxation as pt noting only temporary benefit from traction on prior visits. Pt reporting positive response to estim and would like PT to look into eligibility for home TENS unit.    Rehab Potential  Good    PT Treatment/Interventions  ADLs/Self Care Home Management;Cryotherapy;Electrical Stimulation;Moist Heat;Traction;Therapeutic exercise;Therapeutic activities;Balance training;Neuromuscular re-education;Patient/family education;Manual techniques;Vasopneumatic Device;Taping;Dry needling;Passive range of motion    PT Next Visit Plan  Continue traction per benefit     Consulted and Agree with Plan of Care  Patient       Patient will benefit from skilled therapeutic intervention in order to improve the following deficits and impairments:  Pain, Impaired UE functional use, Decreased range of motion, Decreased mobility, Decreased strength  Visit Diagnosis: Cervicalgia  Abnormal posture  Other symptoms and signs involving the musculoskeletal  system  Muscle weakness (generalized)     Problem List Patient Active Problem List   Diagnosis Date Noted  . DYSPEPSIA 05/26/2008  . ABDOMINAL  PAIN, EPIGASTRIC 05/26/2008  . HYPOTHYROIDISM 05/21/2008  . DIVERTICULOSIS, COLON 05/21/2008  . UNSPECIFIED CONSTIPATION 05/21/2008  . STENOSIS OF RECTUM AND ANUS 05/21/2008  . FIBROSITIS 05/21/2008  . 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 HIRSCHSPRUNG'S DISEASE 05/21/2008    Marry Guan, PT, MPT 08/08/2017, 12:34 PM  [facility] [address]  [address] [place], [place], [address] Phone: [phone]   Fax:  [phone]  Name: Hailey Hughes MRN: [id number] Date of Birth: 06-Jan-1941

## 2017-08-13 ENCOUNTER — Ambulatory Visit: Payer: Medicare Other | Attending: Neurological Surgery

## 2017-08-13 DIAGNOSIS — M6281 Muscle weakness (generalized): Secondary | ICD-10-CM | POA: Insufficient documentation

## 2017-08-13 DIAGNOSIS — R29898 Other symptoms and signs involving the musculoskeletal system: Secondary | ICD-10-CM | POA: Diagnosis present

## 2017-08-13 DIAGNOSIS — R293 Abnormal posture: Secondary | ICD-10-CM | POA: Insufficient documentation

## 2017-08-13 DIAGNOSIS — M542 Cervicalgia: Secondary | ICD-10-CM | POA: Insufficient documentation

## 2017-08-13 NOTE — Patient Instructions (Addendum)
TENS stands for Transcutaneous Electrical Nerve Stimulation. In other words, electrical impulses are allowed to pass through the skin in order to excite a nerve.   Purpose and Use of TENS:  TENS is a method used to manage acute and chronic pain without the use of drugs. It has been effective in managing pain associated with surgery, sprains, strains, trauma, rheumatoid arthritis, and neuralgias. It is a non-addictive, low risk, and non-invasive technique used to control pain. It is not, by any means, a curative form of treatment.   How TENS Works:  Most TENS units are a small pocket-sized unit powered by one 9 volt battery. Attached to the outside of the unit are two lead wires where two pins and/or snaps connect on each wire. All units come with a set of four reusable pads or electrodes. These are placed on the skin surrounding the area involved. By inserting the leads into  the pads, the electricity can pass from the unit making the circuit complete.  As the intensity is turned up slowly, the electrical current enters the body from the electrodes through the skin to the surrounding nerve fibers. This triggers the release of hormones from within the body. These hormones contain pain relievers. By increasing the circulation of these hormones, the person's pain may be lessened. It is also believed that the electrical stimulation itself helps to block the pain messages being sent to the brain, thus also decreasing the body's perception of pain.   Hazards:  TENS units are NOT to be used by patients with PACEMAKERS, DEFIBRILLATORS, DIABETIC PUMPS, PREGNANT WOMEN, and patients with SEIZURE DISORDERS.  TENS units are NOT to be used over the heart, throat, brain, or spinal cord.  One of the major side effects from the TENS unit may be skin irritation. Some people may develop a rash if they are sensitive to the materials used in the electrodes or the connecting wires.   Wear the unit for 15'.   Avoid overuse  due the body getting used to the stem making it not as effective over time.    

## 2017-08-13 NOTE — Therapy (Signed)
[facility] [address]  [address] [place], [place], [address] Phone: [phone]   Fax:  [phone]  Physical Therapy Treatment  Patient Details  Name: Hailey Hughes MRN: [id number] Date of Birth: 1940/10/28 Referring Provider: Dr. Barnett Abu   Encounter Date: 08/13/2017  PT End of Session - 08/13/17 1026    Visit Number  9    Number of Visits  12    Date for PT Re-Evaluation  08/21/17    PT Start Time  1015    PT Stop Time  1110    PT Time Calculation (min)  55 min    Activity Tolerance  Patient tolerated treatment well    Behavior During Therapy  [facility] for tasks assessed/performed       Past Medical History:  Diagnosis Date  . Depression   . Diverticulosis   . DM2 (diabetes mellitus, type 2) (HCC)   . Hiatal hernia   . HTN (hypertension)   . Hyperlipidemia   . Hypothyroidism   . Imperforate anus   . Obesity   . OSA (obstructive sleep apnea)   . Palpitations   . Reflux esophagitis     Past Surgical History:  Procedure Laterality Date  . APPENDECTOMY    . CARPAL TUNNEL RELEASE Right   . COLON RESECTION     age 36  . COLOSTOMY     age 26 due to imperforate anus  . CYSTECTOMY     spinal  . RECTOVAGINAL FISTULA CLOSURE     congenital    There were no vitals filed for this visit.  Subjective Assessment - 08/13/17 1024    Subjective  Hailey Hughes noting improvement in neck pain over last few days however reporting L shoulder pain primary concern.      Pertinent History  HTN, OSA, DM, depression    Diagnostic tests  xray + MRI at [facility]: mild spinal stenosis    Patient Stated Goals  improve pain     Currently in Pain?  Yes    Pain Score  3     Pain Location  Neck    Pain Orientation  Right;Upper    Pain Descriptors / Indicators  Sharp    Pain Type  Acute pain    Pain Radiating Towards  radiating into L shoulder     Pain Onset  More than a month ago    Pain Frequency  Intermittent    Aggravating  Factors   rotating head     Pain Relieving Factors  pain medicaiton     Multiple Pain Sites  No                       [facility] Adult PT Treatment/Exercise - 08/13/17 1227      Self-Care   Self-Care  Other Self-Care Comments    Other Self-Care Comments   Instruction on proper setup and use, with proper electrode placement of home TENS unit 7000 2nd edition as pt. arriving to therapy and requesting this       Neck Exercises: Machines for Strengthening   UBE (Upper Arm Bike)  L2.5 x 6' (3' fwd/3'back)      Neck Exercises: Theraband   Shoulder Extension  10 reps;Other (comment) red TB; added to HEP     Rows  Red;15 reps 3" hold; red TB      Moist Heat Therapy   Number Minutes Moist Heat  15 Minutes    Moist  Heat Location  Cervical;Shoulder and lower back       Electrical Stimulation   Electrical Stimulation Location  L shoulder complex    Electrical Stimulation Action  IFC    Electrical Stimulation Parameters  80-150Hz , intensity to pt. tolerance, 15'    Electrical Stimulation Goals  Pain;Tone      Manual Therapy   Manual Therapy  Soft tissue mobilization;Myofascial release    Manual therapy comments  hooklying    Soft tissue mobilization  L pec, L UT, L shoulder complex     Myofascial Release  L pec minor              PT Education - 08/13/17 1224    Education provided  Yes    Education Details  General TENS unit eduction handout; HEP update     Person(s) Educated  Patient    Methods  Explanation;Demonstration;Verbal cues;Handout    Comprehension  Verbalized understanding;Returned demonstration;Verbal cues required;Need further instruction          PT Long Term Goals - 07/16/17 1540      PT LONG TERM GOAL #1   Title  patient to be independent with advanced HEP    Status  On-going      PT LONG TERM GOAL #2   Title  patient to report pain reduction by >/= 50% for greater than 2 weeks    Status  On-going      PT LONG TERM GOAL #3   Title  patient  to demonstrate good postural awareness and body mechanics as needed for daily activities.    Status  On-going      PT LONG TERM GOAL #4   Title  patient to demonstrate cervical AROM to WNL with pain no greater than 2/10    Status  On-going      PT LONG TERM GOAL #5   Title  patient to improve B UE strength to >/= 4/5 for improved function    Status  On-going            Plan - 08/13/17 1050    Clinical Impression Statement  Hailey Hughes arriving to therapy noting improvement in neck pain however worsening L shoulder pain over past few days without known trigger.  Manual therapy addressing L shoulder/scapular musculature with STM/TPR with some relief noted following this.  Duration of session focused on proper setup, use, and electrode placement of home TENS unit 7000, 2nd edition as pt. arriving to treatment today and requesting instruction.  Pt. verbalized understanding of proper use of TENS.  Still waiting to hear back from insurance regarding [employer] TENS unit coverage.  Ended session with E-stim/moist heat to L shoulder as this area causing pt. most pain at this point.  Will plan for progress note at pt. upcoming visit.      PT Treatment/Interventions  ADLs/Self Care Home Management;Cryotherapy;Electrical Stimulation;Moist Heat;Traction;Therapeutic exercise;Therapeutic activities;Balance training;Neuromuscular re-education;Patient/family education;Manual techniques;Vasopneumatic Device;Taping;Dry needling;Passive range of motion    PT Next Visit Plan  10th visit progress note     Consulted and Agree with Plan of Care  Patient       Patient will benefit from skilled therapeutic intervention in order to improve the following deficits and impairments:  Pain, Impaired UE functional use, Decreased range of motion, Decreased mobility, Decreased strength  Visit Diagnosis: Cervicalgia  Abnormal posture  Other symptoms and signs involving the musculoskeletal system  Muscle weakness  (generalized)     Problem List Patient Active Problem List   Diagnosis Date  Noted  . DYSPEPSIA 05/26/2008  . ABDOMINAL PAIN, EPIGASTRIC 05/26/2008  . HYPOTHYROIDISM 05/21/2008  . DIVERTICULOSIS, COLON 05/21/2008  . UNSPECIFIED CONSTIPATION 05/21/2008  . STENOSIS OF RECTUM AND ANUS 05/21/2008  . FIBROSITIS 05/21/2008  . 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 HIRSCHSPRUNG'S DISEASE 05/21/2008    Kermit Balo, PTA 08/13/17 12:40 PM   [facility] [address]  [address] [place], [place], [address] Phone: [phone]   Fax:  [phone]  Name: Hailey Hughes MRN: [id number] Date of Birth: [birth year]

## 2017-08-19 ENCOUNTER — Ambulatory Visit: Payer: Medicare Other

## 2017-08-20 ENCOUNTER — Ambulatory Visit: Payer: Medicare Other

## 2017-08-20 DIAGNOSIS — M542 Cervicalgia: Secondary | ICD-10-CM

## 2017-08-20 DIAGNOSIS — R29898 Other symptoms and signs involving the musculoskeletal system: Secondary | ICD-10-CM

## 2017-08-20 DIAGNOSIS — R293 Abnormal posture: Secondary | ICD-10-CM

## 2017-08-20 DIAGNOSIS — M6281 Muscle weakness (generalized): Secondary | ICD-10-CM

## 2017-08-20 NOTE — Therapy (Addendum)
[facility] [address]  [place], [place], [address] Phone: [phone]   Fax:  [phone]  Physical Therapy Treatment  Patient Details  Name: Hailey Hughes MRN: [id number] Date of Birth: 1940-09-08 Referring Provider: Dr. Kristeen Miss  Progress Note  Reporting Period 07/10/17 to 08/20/17  See note below for Objective Data and Assessment of Progress/Goals.    Encounter Date: 08/20/2017  PT End of Session - 08/20/17 1402    Visit Number  10    Number of Visits  12    Date for PT Re-Evaluation  08/21/17    PT Start Time  1400    PT Stop Time  1520    PT Time Calculation (min)  80 min    Activity Tolerance  Patient tolerated treatment well    Behavior During Therapy  [facility] for tasks assessed/performed       Past Medical History:  Diagnosis Date  . Depression   . Diverticulosis   . DM2 (diabetes mellitus, type 2) ([place])   . Hiatal hernia   . HTN (hypertension)   . Hyperlipidemia   . Hypothyroidism   . Imperforate anus   . Obesity   . OSA (obstructive sleep apnea)   . Palpitations   . Reflux esophagitis     Past Surgical History:  Procedure Laterality Date  . APPENDECTOMY    . CARPAL TUNNEL RELEASE Right   . COLON RESECTION     age 79  . COLOSTOMY     age 4 due to imperforate anus  . CYSTECTOMY     spinal  . RECTOVAGINAL FISTULA CLOSURE     congenital    There were no vitals filed for this visit.  Subjective Assessment - 08/20/17 1403    Subjective  Pt. noting she does feel she has had increased shoulder pain since stopping celebrex.      Pertinent History  HTN, OSA, DM, depression    Diagnostic tests  xray + MRI at [facility]: mild spinal stenosis    Patient Stated Goals  improve pain     Currently in Pain?  Yes    Pain Score  4  up to 8/10 pain with R rotation     Pain Location  Neck    Pain Orientation  Right;Upper    Pain Descriptors / Indicators  Sharp    Pain Type  Acute pain    Pain Radiating Towards  Radiating into L shoulder     Pain Onset  More than a month ago    Pain Frequency  Intermittent    Aggravating Factors   rotating head     Pain Relieving Factors  pain medication          [facility] PT Assessment - 08/20/17 1417      Observation/Other Assessments   Focus on Therapeutic Outcomes (FOTO)   55% (45% limitation)      AROM   AROM Assessment Site  Cervical    Cervical Flexion  35 pain on R-sided neck up to 5/10     Cervical Extension  45    Cervical - Right Side Bend  37 pain on return up to 6/10 pain     Cervical - Left Side Bend  33    Cervical - Right Rotation  41 pain rising to 8/10    Cervical - Left Rotation  45      Strength   Right/Left Shoulder  Right;Left    Right Shoulder  Flexion  3+/5    Right Shoulder ABduction  3/5    Left Shoulder Flexion  3+/5    Left Shoulder ABduction  3/5                   [facility] Adult PT Treatment/Exercise - 08/20/17 1438      Self-Care   Self-Care  Other Self-Care Comments    Other Self-Care Comments   Update and review of comprehensive HEP with pt. verbalizing understanding       Neck Exercises: Machines for Strengthening   UBE (Upper Arm Bike)  L2.5 x 6' (3' fwd/3'back)      Neck Exercises: Theraband   Shoulder Extension  10 reps;Red    Shoulder External Rotation  -- x 12 reps with yellow TB       Neck Exercises: Standing   Other Standing Exercises  Standing shoulder horizontal abduction with yellow TB in low range x 10 reps on wall  poor tolerance due to L shoulder pain     Other Standing Exercises  Standing B shoulder ER with yellow TB x 12 reps on wall  poor tolerance due to L shoulder pain      Neck Exercises: Seated   Money  10 reps;3 secs with yellow TB; poor tolerance due to L shoulder pain     Shoulder Rolls  15 reps;Backwards Some cueing for proper motion     Other Seated Exercise  seated scapular retraction 10" x 10 reps       Moist Heat Therapy   Number Minutes Moist Heat   15 Minutes    Moist Heat Location  Cervical;Shoulder lower back       Electrical Stimulation   Electrical Stimulation Location  R UT     Electrical Stimulation Action  IFC    Electrical Stimulation Parameters  to tolerance, 15'    Electrical Stimulation Goals  Pain;Tone      Manual Therapy   Manual Therapy  Soft tissue mobilization;Myofascial release    Manual therapy comments  hooklying, and seated     Soft tissue mobilization  L pec (in area of tenderness), B UT, L shoulder complex; pt. noting some relief     Myofascial Release  R UT; palpable TP             PT Education - 08/20/17 1526    Education provided  Yes    Education Details  HEP update; red TB issued to pt. for band row    Person(s) Educated  Patient    Methods  Explanation;Demonstration;Handout;Verbal cues    Comprehension  Verbalized understanding;Returned demonstration;Verbal cues required;Need further instruction          PT Long Term Goals - 08/20/17 1412      PT LONG TERM GOAL #1   Title  patient to be independent with advanced HEP    Status  Partially Met met for current HEP       PT LONG TERM GOAL #2   Title  patient to report pain reduction by >/= 50% for greater than 2 weeks    Status  Partially Met Pt. noting ~ 50% improvement in neck pain however feels this is within this past week      PT LONG TERM GOAL #3   Title  patient to demonstrate good postural awareness and body mechanics as needed for daily activities.    Status  Achieved Pt. noting she has altered desk setup and been more aware of proper bending  mechanics       PT LONG TERM GOAL #4   Title  patient to demonstrate cervical AROM to WNL with pain no greater than 2/10    Status  Partially Met Still with significant neck pain increase with R rotation and R side bending and limited in B side bending and roational motions       PT LONG TERM GOAL #5   Title  patient to improve B UE strength to >/= 4/5 for improved function    Status   On-going grossly 3/5-3+/5            Plan - 08/20/17 1411    Clinical Impression Statement  Hailey Hughes seen to start session reporting she may consider f/u with MD to seek out L shoulder MRI as she is still bothered by frequent pain and feels this may not be related to neck.  Pt. has made good progress related to cervical ROM now partially meeting ROM goal with remaining limitation in B side bending and rotation however much improved ROM in all motions today.  Pt. with limited strength improvement in B UE strength however this has been impacted by pt.'s limited tolerance for shoulder strengthening activities due to L shoulder pain.  Pt. verbalizing that she has made alterations to sitting posture and desk setup at home as to reduce cervical strain with some success.  Pt. has met or partially met all therapy goals with exception of strength goal ongoing and requesting to go on 30-day hold from therapy.  Supervising PT approving this plan and comprehensive HEP reviewed with pt. today with update as appropriate.  Pt. now on 30-day hold from therapy.      PT Treatment/Interventions  ADLs/Self Care Home Management;Cryotherapy;Electrical Stimulation;Moist Heat;Traction;Therapeutic exercise;Therapeutic activities;Balance training;Neuromuscular re-education;Patient/family education;Manual techniques;Vasopneumatic Device;Taping;Dry needling;Passive range of motion    Consulted and Agree with Plan of Care  Patient       Patient will benefit from skilled therapeutic intervention in order to improve the following deficits and impairments:  Pain, Impaired UE functional use, Decreased range of motion, Decreased mobility, Decreased strength  Visit Diagnosis: Cervicalgia  Abnormal posture  Other symptoms and signs involving the musculoskeletal system  Muscle weakness (generalized)     Problem List Patient Active Problem List   Diagnosis Date Noted  . DYSPEPSIA 05/26/2008  . ABDOMINAL PAIN, EPIGASTRIC  05/26/2008  . HYPOTHYROIDISM 05/21/2008  . DIVERTICULOSIS, COLON 05/21/2008  . UNSPECIFIED CONSTIPATION 05/21/2008  . STENOSIS OF RECTUM AND ANUS 05/21/2008  . FIBROSITIS 05/21/2008  . 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 HIRSCHSPRUNG'S DISEASE 05/21/2008    Bess Harvest, PTA 08/20/17 4:45 PM  [facility] [address]  [place], [place], [address] Phone: [phone]   Fax:  [phone]   Percival Spanish, PT, MPT 08/20/17, 4:56 PM  [facility] [address]  [place], [place], [address] Phone: [phone]   Fax:  [phone]    Name: Hailey Hughes MRN: [id number] Date of Birth: [birth year]     PHYSICAL THERAPY DISCHARGE SUMMARY  Visits from Start of Care: 10  Current functional level related to goals / functional outcomes:   Refer to above clinical impression for status as of last visit on 08/20/17. Pt was placed on hold for 30 days and has not needed to return to PT, therefore will proceed with discharge from PT for this episode.   Remaining deficits:   As above.  Education / Equipment:   HEP  Plan: Patient agrees to discharge.  Patient goals were not met. Patient is being discharged due to being pleased with the current functional level.  ?????     Percival Spanish, PT, MPT 10/04/17, 9:00 AM  [facility] [address]  [place], [place], [address] Phone: [phone]   Fax:  [phone]

## 2017-08-21 ENCOUNTER — Other Ambulatory Visit: Payer: Self-pay | Admitting: Physician Assistant

## 2017-08-21 ENCOUNTER — Ambulatory Visit
Admission: RE | Admit: 2017-08-21 | Discharge: 2017-08-21 | Disposition: A | Discharge status: Home / Self Care | Payer: Medicare Other | Source: Ambulatory Visit | Attending: Physician Assistant | Admitting: Physician Assistant

## 2017-08-21 DIAGNOSIS — R609 Edema, unspecified: Secondary | ICD-10-CM

## 2017-08-23 ENCOUNTER — Ambulatory Visit: Payer: Medicare Other

## 2017-08-23 ENCOUNTER — Ambulatory Visit
Admission: RE | Admit: 2017-08-23 | Discharge: 2017-08-23 | Disposition: A | Discharge status: Home / Self Care | Payer: Medicare Other | Source: Ambulatory Visit | Attending: Family Medicine | Admitting: Family Medicine

## 2017-08-23 DIAGNOSIS — Z1231 Encounter for screening mammogram for malignant neoplasm of breast: Secondary | ICD-10-CM

## 2017-09-16 ENCOUNTER — Ambulatory Visit
Admission: RE | Admit: 2017-09-16 | Discharge: 2017-09-16 | Disposition: A | Discharge status: Home / Self Care | Payer: Medicare Other | Source: Ambulatory Visit | Attending: Family Medicine | Admitting: Family Medicine

## 2017-09-16 DIAGNOSIS — E2839 Other primary ovarian failure: Secondary | ICD-10-CM

## 2017-12-30 DIAGNOSIS — N183 Chronic kidney disease, stage 3 unspecified: Secondary | ICD-10-CM | POA: Diagnosis present

## 2017-12-30 DIAGNOSIS — F330 Major depressive disorder, recurrent, mild: Secondary | ICD-10-CM | POA: Insufficient documentation

## 2018-06-17 ENCOUNTER — Encounter: Payer: Self-pay | Admitting: Gastroenterology

## 2018-07-22 ENCOUNTER — Other Ambulatory Visit: Payer: Self-pay

## 2018-07-22 ENCOUNTER — Inpatient Hospital Stay (HOSPITAL_COMMUNITY)
Admission: EM | Admit: 2018-07-22 | Discharge: 2018-08-20 | DRG: 853 | Disposition: A | Discharge status: Skilled Nursing Facility | Payer: Medicare Other | Attending: Internal Medicine | Admitting: Internal Medicine

## 2018-07-22 ENCOUNTER — Emergency Department (HOSPITAL_COMMUNITY): Payer: Medicare Other

## 2018-07-22 ENCOUNTER — Encounter (HOSPITAL_COMMUNITY): Payer: Self-pay

## 2018-07-22 DIAGNOSIS — K650 Generalized (acute) peritonitis: Secondary | ICD-10-CM | POA: Diagnosis not present

## 2018-07-22 DIAGNOSIS — Z87738 Personal history of other specified (corrected) congenital malformations of digestive system: Secondary | ICD-10-CM

## 2018-07-22 DIAGNOSIS — I959 Hypotension, unspecified: Secondary | ICD-10-CM | POA: Diagnosis not present

## 2018-07-22 DIAGNOSIS — J90 Pleural effusion, not elsewhere classified: Secondary | ICD-10-CM

## 2018-07-22 DIAGNOSIS — R4781 Slurred speech: Secondary | ICD-10-CM | POA: Diagnosis present

## 2018-07-22 DIAGNOSIS — N736 Female pelvic peritoneal adhesions (postinfective): Secondary | ICD-10-CM | POA: Diagnosis present

## 2018-07-22 DIAGNOSIS — D638 Anemia in other chronic diseases classified elsewhere: Secondary | ICD-10-CM | POA: Diagnosis not present

## 2018-07-22 DIAGNOSIS — Z833 Family history of diabetes mellitus: Secondary | ICD-10-CM

## 2018-07-22 DIAGNOSIS — M069 Rheumatoid arthritis, unspecified: Secondary | ICD-10-CM | POA: Diagnosis present

## 2018-07-22 DIAGNOSIS — K5790 Diverticulosis of intestine, part unspecified, without perforation or abscess without bleeding: Secondary | ICD-10-CM | POA: Diagnosis present

## 2018-07-22 DIAGNOSIS — K559 Vascular disorder of intestine, unspecified: Secondary | ICD-10-CM | POA: Diagnosis not present

## 2018-07-22 DIAGNOSIS — E875 Hyperkalemia: Secondary | ICD-10-CM | POA: Diagnosis not present

## 2018-07-22 DIAGNOSIS — E870 Hyperosmolality and hypernatremia: Secondary | ICD-10-CM | POA: Diagnosis not present

## 2018-07-22 DIAGNOSIS — J9601 Acute respiratory failure with hypoxia: Secondary | ICD-10-CM | POA: Diagnosis not present

## 2018-07-22 DIAGNOSIS — E1165 Type 2 diabetes mellitus with hyperglycemia: Secondary | ICD-10-CM | POA: Diagnosis present

## 2018-07-22 DIAGNOSIS — E11649 Type 2 diabetes mellitus with hypoglycemia without coma: Secondary | ICD-10-CM | POA: Diagnosis not present

## 2018-07-22 DIAGNOSIS — E785 Hyperlipidemia, unspecified: Secondary | ICD-10-CM | POA: Diagnosis present

## 2018-07-22 DIAGNOSIS — J690 Pneumonitis due to inhalation of food and vomit: Secondary | ICD-10-CM | POA: Diagnosis not present

## 2018-07-22 DIAGNOSIS — R7989 Other specified abnormal findings of blood chemistry: Secondary | ICD-10-CM | POA: Diagnosis not present

## 2018-07-22 DIAGNOSIS — R0602 Shortness of breath: Secondary | ICD-10-CM

## 2018-07-22 DIAGNOSIS — E039 Hypothyroidism, unspecified: Secondary | ICD-10-CM | POA: Diagnosis not present

## 2018-07-22 DIAGNOSIS — K529 Noninfective gastroenteritis and colitis, unspecified: Secondary | ICD-10-CM

## 2018-07-22 DIAGNOSIS — N390 Urinary tract infection, site not specified: Secondary | ICD-10-CM | POA: Diagnosis present

## 2018-07-22 DIAGNOSIS — K651 Peritoneal abscess: Secondary | ICD-10-CM | POA: Diagnosis not present

## 2018-07-22 DIAGNOSIS — Q431 Hirschsprung's disease: Secondary | ICD-10-CM | POA: Diagnosis not present

## 2018-07-22 DIAGNOSIS — R140 Abdominal distension (gaseous): Secondary | ICD-10-CM

## 2018-07-22 DIAGNOSIS — Z79890 Hormone replacement therapy (postmenopausal): Secondary | ICD-10-CM

## 2018-07-22 DIAGNOSIS — M25572 Pain in left ankle and joints of left foot: Secondary | ICD-10-CM | POA: Diagnosis not present

## 2018-07-22 DIAGNOSIS — R188 Other ascites: Secondary | ICD-10-CM | POA: Diagnosis not present

## 2018-07-22 DIAGNOSIS — R748 Abnormal levels of other serum enzymes: Secondary | ICD-10-CM

## 2018-07-22 DIAGNOSIS — J9690 Respiratory failure, unspecified, unspecified whether with hypoxia or hypercapnia: Secondary | ICD-10-CM

## 2018-07-22 DIAGNOSIS — Z6841 Body Mass Index (BMI) 40.0 and over, adult: Secondary | ICD-10-CM

## 2018-07-22 DIAGNOSIS — K810 Acute cholecystitis: Secondary | ICD-10-CM | POA: Diagnosis present

## 2018-07-22 DIAGNOSIS — Z1159 Encounter for screening for other viral diseases: Secondary | ICD-10-CM

## 2018-07-22 DIAGNOSIS — A419 Sepsis, unspecified organism: Secondary | ICD-10-CM

## 2018-07-22 DIAGNOSIS — L89156 Pressure-induced deep tissue damage of sacral region: Secondary | ICD-10-CM | POA: Diagnosis not present

## 2018-07-22 DIAGNOSIS — N170 Acute kidney failure with tubular necrosis: Secondary | ICD-10-CM | POA: Diagnosis present

## 2018-07-22 DIAGNOSIS — Z91030 Bee allergy status: Secondary | ICD-10-CM

## 2018-07-22 DIAGNOSIS — D6959 Other secondary thrombocytopenia: Secondary | ICD-10-CM | POA: Diagnosis present

## 2018-07-22 DIAGNOSIS — I129 Hypertensive chronic kidney disease with stage 1 through stage 4 chronic kidney disease, or unspecified chronic kidney disease: Secondary | ICD-10-CM | POA: Diagnosis present

## 2018-07-22 DIAGNOSIS — N179 Acute kidney failure, unspecified: Secondary | ICD-10-CM | POA: Diagnosis not present

## 2018-07-22 DIAGNOSIS — R6521 Severe sepsis with septic shock: Secondary | ICD-10-CM | POA: Diagnosis present

## 2018-07-22 DIAGNOSIS — Z8249 Family history of ischemic heart disease and other diseases of the circulatory system: Secondary | ICD-10-CM

## 2018-07-22 DIAGNOSIS — E119 Type 2 diabetes mellitus without complications: Secondary | ICD-10-CM

## 2018-07-22 DIAGNOSIS — A4159 Other Gram-negative sepsis: Principal | ICD-10-CM | POA: Diagnosis present

## 2018-07-22 DIAGNOSIS — K55049 Acute infarction of large intestine, extent unspecified: Secondary | ICD-10-CM | POA: Diagnosis present

## 2018-07-22 DIAGNOSIS — Z7984 Long term (current) use of oral hypoglycemic drugs: Secondary | ICD-10-CM

## 2018-07-22 DIAGNOSIS — L8990 Pressure ulcer of unspecified site, unspecified stage: Secondary | ICD-10-CM | POA: Insufficient documentation

## 2018-07-22 DIAGNOSIS — N183 Chronic kidney disease, stage 3 unspecified: Secondary | ICD-10-CM | POA: Diagnosis present

## 2018-07-22 DIAGNOSIS — Z8371 Family history of colonic polyps: Secondary | ICD-10-CM

## 2018-07-22 DIAGNOSIS — Z800 Family history of malignant neoplasm of digestive organs: Secondary | ICD-10-CM

## 2018-07-22 DIAGNOSIS — E440 Moderate protein-calorie malnutrition: Secondary | ICD-10-CM | POA: Diagnosis not present

## 2018-07-22 DIAGNOSIS — Z888 Allergy status to other drugs, medicaments and biological substances status: Secondary | ICD-10-CM

## 2018-07-22 DIAGNOSIS — K567 Ileus, unspecified: Secondary | ICD-10-CM | POA: Diagnosis not present

## 2018-07-22 DIAGNOSIS — K210 Gastro-esophageal reflux disease with esophagitis: Secondary | ICD-10-CM | POA: Diagnosis present

## 2018-07-22 DIAGNOSIS — E1122 Type 2 diabetes mellitus with diabetic chronic kidney disease: Secondary | ICD-10-CM | POA: Diagnosis present

## 2018-07-22 DIAGNOSIS — Z8719 Personal history of other diseases of the digestive system: Secondary | ICD-10-CM | POA: Diagnosis not present

## 2018-07-22 DIAGNOSIS — K449 Diaphragmatic hernia without obstruction or gangrene: Secondary | ICD-10-CM | POA: Diagnosis present

## 2018-07-22 DIAGNOSIS — J9600 Acute respiratory failure, unspecified whether with hypoxia or hypercapnia: Secondary | ICD-10-CM

## 2018-07-22 DIAGNOSIS — L8942 Pressure ulcer of contiguous site of back, buttock and hip, stage 2: Secondary | ICD-10-CM | POA: Diagnosis not present

## 2018-07-22 DIAGNOSIS — B961 Klebsiella pneumoniae [K. pneumoniae] as the cause of diseases classified elsewhere: Secondary | ICD-10-CM | POA: Diagnosis present

## 2018-07-22 DIAGNOSIS — Z7982 Long term (current) use of aspirin: Secondary | ICD-10-CM

## 2018-07-22 DIAGNOSIS — E874 Mixed disorder of acid-base balance: Secondary | ICD-10-CM | POA: Diagnosis present

## 2018-07-22 DIAGNOSIS — T508X5A Adverse effect of diagnostic agents, initial encounter: Secondary | ICD-10-CM | POA: Diagnosis present

## 2018-07-22 DIAGNOSIS — G4733 Obstructive sleep apnea (adult) (pediatric): Secondary | ICD-10-CM | POA: Diagnosis present

## 2018-07-22 DIAGNOSIS — Z0189 Encounter for other specified special examinations: Secondary | ICD-10-CM

## 2018-07-22 DIAGNOSIS — R0902 Hypoxemia: Secondary | ICD-10-CM

## 2018-07-22 DIAGNOSIS — M25571 Pain in right ankle and joints of right foot: Secondary | ICD-10-CM | POA: Diagnosis not present

## 2018-07-22 LAB — CBC WITH DIFFERENTIAL/PLATELET
Abs Immature Granulocytes: 0.06 10*3/uL (ref 0.00–0.07)
Abs Immature Granulocytes: 0.07 10*3/uL (ref 0.00–0.07)
Basophils Absolute: 0.1 10*3/uL (ref 0.0–0.1)
Basophils Absolute: 0.1 10*3/uL (ref 0.0–0.1)
Basophils Relative: 0 %
Basophils Relative: 0 %
Eosinophils Absolute: 0.1 10*3/uL (ref 0.0–0.5)
Eosinophils Absolute: 0.1 10*3/uL (ref 0.0–0.5)
Eosinophils Relative: 0 %
Eosinophils Relative: 0 %
HCT: 46.9 % — ABNORMAL HIGH (ref 36.0–46.0)
HCT: 39.9 % (ref 36.0–46.0)
Hemoglobin: 15 g/dL (ref 12.0–15.0)
Hemoglobin: 11.6 g/dL — ABNORMAL LOW (ref 12.0–15.0)
Immature Granulocytes: 0 %
Immature Granulocytes: 1 %
Lymphocytes Relative: 7 %
Lymphocytes Relative: 4 %
Lymphs Abs: 1.2 10*3/uL (ref 0.7–4.0)
Lymphs Abs: 0.6 10*3/uL — ABNORMAL LOW (ref 0.7–4.0)
MCH: 29.6 pg (ref 26.0–34.0)
MCH: 29.3 pg (ref 26.0–34.0)
MCHC: 32 g/dL (ref 30.0–36.0)
MCHC: 29.1 g/dL — ABNORMAL LOW (ref 30.0–36.0)
MCV: 92.5 fL (ref 80.0–100.0)
MCV: 100.8 fL — ABNORMAL HIGH (ref 80.0–100.0)
Monocytes Absolute: 0.3 10*3/uL (ref 0.1–1.0)
Monocytes Absolute: 1.6 10*3/uL — ABNORMAL HIGH (ref 0.1–1.0)
Monocytes Relative: 2 %
Monocytes Relative: 11 %
Neutro Abs: 15.9 10*3/uL — ABNORMAL HIGH (ref 1.7–7.7)
Neutro Abs: 11.6 10*3/uL — ABNORMAL HIGH (ref 1.7–7.7)
Neutrophils Relative %: 91 %
Neutrophils Relative %: 84 %
Platelets: 191 10*3/uL (ref 150–400)
Platelets: 130 10*3/uL — ABNORMAL LOW (ref 150–400)
RBC: 5.07 MIL/uL (ref 3.87–5.11)
RBC: 3.96 MIL/uL (ref 3.87–5.11)
RDW: 14.2 % (ref 11.5–15.5)
RDW: 14.9 % (ref 11.5–15.5)
WBC: 17.6 10*3/uL — ABNORMAL HIGH (ref 4.0–10.5)
WBC: 13.9 10*3/uL — ABNORMAL HIGH (ref 4.0–10.5)
nRBC: 0 % (ref 0.0–0.2)
nRBC: 0 % (ref 0.0–0.2)

## 2018-07-22 LAB — COMPREHENSIVE METABOLIC PANEL
ALT: 26 U/L (ref 0–44)
ALT: 47 U/L — ABNORMAL HIGH (ref 0–44)
AST: 36 U/L (ref 15–41)
AST: 65 U/L — ABNORMAL HIGH (ref 15–41)
Albumin: 3.8 g/dL (ref 3.5–5.0)
Albumin: 2.6 g/dL — ABNORMAL LOW (ref 3.5–5.0)
Alkaline Phosphatase: 60 U/L (ref 38–126)
Alkaline Phosphatase: 41 U/L (ref 38–126)
Anion gap: 14 (ref 5–15)
Anion gap: 13 (ref 5–15)
BUN: 42 mg/dL — ABNORMAL HIGH (ref 8–23)
BUN: 52 mg/dL — ABNORMAL HIGH (ref 8–23)
CO2: 18 mmol/L — ABNORMAL LOW (ref 22–32)
CO2: 13 mmol/L — ABNORMAL LOW (ref 22–32)
Calcium: 9 mg/dL (ref 8.9–10.3)
Calcium: 7.3 mg/dL — ABNORMAL LOW (ref 8.9–10.3)
Chloride: 108 mmol/L (ref 98–111)
Chloride: 113 mmol/L — ABNORMAL HIGH (ref 98–111)
Creatinine, Ser: 2.01 mg/dL — ABNORMAL HIGH (ref 0.44–1.00)
Creatinine, Ser: 2.66 mg/dL — ABNORMAL HIGH (ref 0.44–1.00)
GFR calc Af Amer: 26 mL/min — ABNORMAL LOW (ref >60)
GFR calc Af Amer: 19 mL/min — ABNORMAL LOW (ref >60)
GFR calc non Af Amer: 23 mL/min — ABNORMAL LOW (ref >60)
GFR calc non Af Amer: 16 mL/min — ABNORMAL LOW (ref >60)
Glucose, Bld: 215 mg/dL — ABNORMAL HIGH (ref 70–99)
Glucose, Bld: 148 mg/dL — ABNORMAL HIGH (ref 70–99)
Potassium: 3.6 mmol/L (ref 3.5–5.1)
Potassium: 5.1 mmol/L (ref 3.5–5.1)
Sodium: 140 mmol/L (ref 135–145)
Sodium: 139 mmol/L (ref 135–145)
Total Bilirubin: 0.7 mg/dL (ref 0.3–1.2)
Total Bilirubin: 0.6 mg/dL (ref 0.3–1.2)
Total Protein: 6.2 g/dL — ABNORMAL LOW (ref 6.5–8.1)
Total Protein: 4.6 g/dL — ABNORMAL LOW (ref 6.5–8.1)

## 2018-07-22 LAB — URINALYSIS, ROUTINE W REFLEX MICROSCOPIC
Bilirubin Urine: NEGATIVE
Glucose, UA: NEGATIVE mg/dL
Ketones, ur: 5 mg/dL — AB
Nitrite: NEGATIVE
Protein, ur: 100 mg/dL — AB
Specific Gravity, Urine: 1.013 (ref 1.005–1.030)
WBC, UA: >50 WBC/hpf — ABNORMAL HIGH (ref 0–5)
pH: 5 (ref 5.0–8.0)

## 2018-07-22 LAB — SARS CORONAVIRUS 2 BY RT PCR (HOSPITAL ORDER, PERFORMED IN ~~LOC~~ HOSPITAL LAB): SARS Coronavirus 2: NEGATIVE

## 2018-07-22 LAB — GLUCOSE, CAPILLARY
Glucose-Capillary: 122 mg/dL — ABNORMAL HIGH (ref 70–99)
Glucose-Capillary: 131 mg/dL — ABNORMAL HIGH (ref 70–99)

## 2018-07-22 LAB — LACTIC ACID, PLASMA
Lactic Acid, Venous: 5 mmol/L (ref 0.5–1.9)
Lactic Acid, Venous: 5 mmol/L (ref 0.5–1.9)
Lactic Acid, Venous: 4 mmol/L (ref 0.5–1.9)
Lactic Acid, Venous: 5 mmol/L (ref 0.5–1.9)
Lactic Acid, Venous: 5.4 mmol/L (ref 0.5–1.9)
Lactic Acid, Venous: 4.2 mmol/L (ref 0.5–1.9)

## 2018-07-22 LAB — LIPASE, BLOOD: Lipase: 55 U/L — ABNORMAL HIGH (ref 11–51)

## 2018-07-22 LAB — HEMOGLOBIN A1C
Hgb A1c MFr Bld: 7 % — ABNORMAL HIGH (ref 4.8–5.6)
Mean Plasma Glucose: 154.2 mg/dL

## 2018-07-22 LAB — CBG MONITORING, ED: Glucose-Capillary: 152 mg/dL — ABNORMAL HIGH (ref 70–99)

## 2018-07-22 LAB — POC OCCULT BLOOD, ED: Fecal Occult Bld: POSITIVE — AB

## 2018-07-22 LAB — MRSA PCR SCREENING: MRSA by PCR: NEGATIVE

## 2018-07-22 LAB — TSH: TSH: 2.549 u[IU]/mL (ref 0.350–4.500)

## 2018-07-22 MED ORDER — PROCHLORPERAZINE EDISYLATE 10 MG/2ML IJ SOLN
10.0000 mg | Freq: Once | INTRAMUSCULAR | Status: AC
  Administered 2018-07-22: 10 mg via INTRAVENOUS
  Filled 2018-07-22: qty 2

## 2018-07-22 MED ORDER — LORATADINE 10 MG PO TABS: 10.0000 mg | ORAL_TABLET | Freq: Every day | ORAL | Status: DC

## 2018-07-22 MED ORDER — ORAL CARE MOUTH RINSE
15.0000 mL | Freq: Two times a day (BID) | OROMUCOSAL | Status: DC
  Administered 2018-07-23: 15 mL via OROMUCOSAL

## 2018-07-22 MED ORDER — SENNOSIDES-DOCUSATE SODIUM 8.6-50 MG PO TABS
2.0000 | ORAL_TABLET | Freq: Two times a day (BID) | ORAL | Status: DC
  Administered 2018-07-22: 2 via ORAL
  Filled 2018-07-22: qty 2

## 2018-07-22 MED ORDER — CHLORHEXIDINE GLUCONATE CLOTH 2 % EX PADS
6.0000 | MEDICATED_PAD | Freq: Every day | CUTANEOUS | Status: DC
  Administered 2018-07-23 – 2018-07-31 (×8): 6 via TOPICAL

## 2018-07-22 MED ORDER — MONTELUKAST SODIUM 10 MG PO TABS: 10.0000 mg | ORAL_TABLET | Freq: Every day | ORAL | Status: DC | PRN

## 2018-07-22 MED ORDER — PIPERACILLIN-TAZOBACTAM 3.375 G IVPB
3.3750 g | Freq: Three times a day (TID) | INTRAVENOUS | Status: DC
  Administered 2018-07-22: 3.375 g via INTRAVENOUS
  Filled 2018-07-22: qty 50

## 2018-07-22 MED ORDER — BUMETANIDE 1 MG PO TABS: 2.0000 mg | ORAL_TABLET | Freq: Every evening | ORAL | Status: DC

## 2018-07-22 MED ORDER — SODIUM CHLORIDE 0.9 % IV SOLN
INTRAVENOUS | Status: DC
  Administered 2018-07-22: 18:00:00 via INTRAVENOUS

## 2018-07-22 MED ORDER — SODIUM CHLORIDE 0.9 % IV BOLUS
1000.0000 mL | Freq: Once | INTRAVENOUS | Status: AC
  Administered 2018-07-22: 1000 mL via INTRAVENOUS

## 2018-07-22 MED ORDER — FENTANYL CITRATE (PF) 100 MCG/2ML IJ SOLN
50.0000 ug | Freq: Once | INTRAMUSCULAR | Status: AC
  Administered 2018-07-22: 50 ug via INTRAVENOUS
  Filled 2018-07-22: qty 2

## 2018-07-22 MED ORDER — ATORVASTATIN CALCIUM 10 MG PO TABS
20.0000 mg | ORAL_TABLET | Freq: Every evening | ORAL | Status: DC
  Administered 2018-07-22: 20 mg via ORAL
  Filled 2018-07-22: qty 1

## 2018-07-22 MED ORDER — OLOPATADINE HCL 0.1 % OP SOLN
1.0000 [drp] | Freq: Two times a day (BID) | OPHTHALMIC | Status: DC
  Administered 2018-07-23 – 2018-08-16 (×48): 1 [drp] via OPHTHALMIC
  Filled 2018-07-22 (×2): qty 5

## 2018-07-22 MED ORDER — METRONIDAZOLE IN NACL 5-0.79 MG/ML-% IV SOLN
500.0000 mg | Freq: Once | INTRAVENOUS | Status: AC
  Administered 2018-07-22: 500 mg via INTRAVENOUS
  Filled 2018-07-22: qty 100

## 2018-07-22 MED ORDER — IOHEXOL 300 MG/ML  SOLN
30.0000 mL | Freq: Once | INTRAMUSCULAR | Status: AC | PRN
  Administered 2018-07-22: 30 mL via ORAL

## 2018-07-22 MED ORDER — PANTOPRAZOLE SODIUM 40 MG IV SOLR
40.0000 mg | Freq: Two times a day (BID) | INTRAVENOUS | Status: DC
  Administered 2018-07-23 – 2018-07-29 (×15): 40 mg via INTRAVENOUS
  Filled 2018-07-22 (×15): qty 40

## 2018-07-22 MED ORDER — ASPIRIN EC 81 MG PO TBEC
81.0000 mg | DELAYED_RELEASE_TABLET | Freq: Every evening | ORAL | Status: DC
  Administered 2018-07-22: 81 mg via ORAL
  Filled 2018-07-22: qty 1

## 2018-07-22 MED ORDER — METOCLOPRAMIDE HCL 5 MG/ML IJ SOLN
10.0000 mg | Freq: Once | INTRAMUSCULAR | Status: AC
  Administered 2018-07-22: 10 mg via INTRAVENOUS
  Filled 2018-07-22: qty 2

## 2018-07-22 MED ORDER — DIPHENHYDRAMINE HCL 50 MG/ML IJ SOLN
25.0000 mg | Freq: Once | INTRAMUSCULAR | Status: AC
  Administered 2018-07-22: 25 mg via INTRAVENOUS
  Filled 2018-07-22: qty 1

## 2018-07-22 MED ORDER — HEPARIN SODIUM (PORCINE) 5000 UNIT/ML IJ SOLN
5000.0000 [IU] | Freq: Three times a day (TID) | INTRAMUSCULAR | Status: DC
  Administered 2018-07-22 – 2018-07-23 (×2): 5000 [IU] via SUBCUTANEOUS
  Filled 2018-07-22 (×2): qty 1

## 2018-07-22 MED ORDER — ONDANSETRON HCL 4 MG/2ML IJ SOLN
4.0000 mg | Freq: Four times a day (QID) | INTRAMUSCULAR | Status: DC | PRN
  Administered 2018-08-11 – 2018-08-16 (×2): 4 mg via INTRAVENOUS
  Filled 2018-07-22 (×2): qty 2

## 2018-07-22 MED ORDER — CIPROFLOXACIN IN D5W 400 MG/200ML IV SOLN
400.0000 mg | Freq: Once | INTRAVENOUS | Status: AC
  Administered 2018-07-22: 400 mg via INTRAVENOUS
  Filled 2018-07-22: qty 200

## 2018-07-22 MED ORDER — MORPHINE SULFATE (PF) 4 MG/ML IV SOLN
4.0000 mg | Freq: Once | INTRAVENOUS | Status: AC
  Administered 2018-07-22: 4 mg via INTRAVENOUS
  Filled 2018-07-22: qty 1

## 2018-07-22 MED ORDER — HYDROMORPHONE HCL 1 MG/ML IJ SOLN
0.5000 mg | INTRAMUSCULAR | Status: DC | PRN
  Administered 2018-07-22 – 2018-07-23 (×2): 0.5 mg via INTRAVENOUS
  Filled 2018-07-22: qty 1
  Filled 2018-07-22: qty 0.5

## 2018-07-22 MED ORDER — ONDANSETRON HCL 4 MG/2ML IJ SOLN
4.0000 mg | Freq: Once | INTRAMUSCULAR | Status: AC
  Administered 2018-07-22: 4 mg via INTRAVENOUS
  Filled 2018-07-22: qty 2

## 2018-07-22 MED ORDER — CELECOXIB 200 MG PO CAPS: 200.0000 mg | ORAL_CAPSULE | Freq: Every evening | ORAL | Status: DC

## 2018-07-22 MED ORDER — OXYCODONE HCL 5 MG PO TABS
5.0000 mg | ORAL_TABLET | Freq: Four times a day (QID) | ORAL | Status: DC | PRN
  Administered 2018-07-22: 5 mg via ORAL
  Filled 2018-07-22: qty 1

## 2018-07-22 MED ORDER — FEBUXOSTAT 40 MG PO TABS
40.0000 mg | ORAL_TABLET | Freq: Every evening | ORAL | Status: DC
  Filled 2018-07-22: qty 1

## 2018-07-22 MED ORDER — ACETAMINOPHEN 325 MG PO TABS: 650.0000 mg | ORAL_TABLET | Freq: Four times a day (QID) | ORAL | Status: DC | PRN

## 2018-07-22 MED ORDER — PIPERACILLIN-TAZOBACTAM IN DEX 2-0.25 GM/50ML IV SOLN
2.2500 g | Freq: Four times a day (QID) | INTRAVENOUS | Status: DC
  Administered 2018-07-23 – 2018-07-26 (×14): 2.25 g via INTRAVENOUS
  Filled 2018-07-22 (×16): qty 50

## 2018-07-22 MED ORDER — SODIUM CHLORIDE 0.9 % IV SOLN
250.0000 mL | INTRAVENOUS | Status: DC
  Administered 2018-08-11 – 2018-08-18 (×2): 250 mL via INTRAVENOUS

## 2018-07-22 MED ORDER — INSULIN ASPART 100 UNIT/ML ~~LOC~~ SOLN: 0.0000 [IU] | Freq: Every day | SUBCUTANEOUS | Status: DC

## 2018-07-22 MED ORDER — PIPERACILLIN-TAZOBACTAM 3.375 G IVPB 30 MIN
3.3750 g | Freq: Once | INTRAVENOUS | Status: AC
  Administered 2018-07-22: 3.375 g via INTRAVENOUS
  Filled 2018-07-22: qty 50

## 2018-07-22 MED ORDER — POLYETHYLENE GLYCOL 3350 17 G PO PACK
17.0000 g | PACK | Freq: Every day | ORAL | Status: DC
  Filled 2018-07-22: qty 1

## 2018-07-22 MED ORDER — SODIUM CHLORIDE 0.9 % IV BOLUS
500.0000 mL | Freq: Once | INTRAVENOUS | Status: AC
  Administered 2018-07-22: 500 mL via INTRAVENOUS

## 2018-07-22 MED ORDER — PHENYLEPHRINE HCL-NACL 10-0.9 MG/250ML-% IV SOLN
0.0000 ug/min | INTRAVENOUS | Status: DC
  Administered 2018-07-22: 195 ug/min via INTRAVENOUS
  Administered 2018-07-22: 25 ug/min via INTRAVENOUS
  Administered 2018-07-23: 200 ug/min via INTRAVENOUS
  Administered 2018-07-23: 135 ug/min via INTRAVENOUS
  Filled 2018-07-22 (×2): qty 500
  Filled 2018-07-22: qty 750

## 2018-07-22 MED ORDER — LEVOTHYROXINE SODIUM 112 MCG PO TABS
112.0000 ug | ORAL_TABLET | Freq: Every day | ORAL | Status: DC
  Administered 2018-07-23: 112 ug via ORAL
  Filled 2018-07-22: qty 1

## 2018-07-22 MED ORDER — FLUTICASONE PROPIONATE 50 MCG/ACT NA SUSP: 2.0000 | Freq: Every day | NASAL | Status: DC | PRN

## 2018-07-22 MED ORDER — CELECOXIB 100 MG PO CAPS: 100.0000 mg | ORAL_CAPSULE | Freq: Every evening | ORAL | Status: DC

## 2018-07-22 MED ORDER — INSULIN ASPART 100 UNIT/ML ~~LOC~~ SOLN
0.0000 [IU] | Freq: Three times a day (TID) | SUBCUTANEOUS | Status: DC
  Administered 2018-07-22 – 2018-07-23 (×2): 1 [IU] via SUBCUTANEOUS

## 2018-07-22 MED ORDER — METOPROLOL SUCCINATE ER 50 MG PO TB24: 50.0000 mg | ORAL_TABLET | Freq: Every day | ORAL | Status: DC

## 2018-07-22 MED ORDER — TOBRAMYCIN-DEXAMETHASONE 0.3-0.1 % OP OINT
1.0000 "application " | TOPICAL_OINTMENT | Freq: Two times a day (BID) | OPHTHALMIC | Status: DC
  Administered 2018-07-23 – 2018-08-17 (×47): 1 via OPHTHALMIC
  Filled 2018-07-22 (×3): qty 3.5

## 2018-07-22 MED ORDER — PANTOPRAZOLE SODIUM 40 MG PO TBEC: 40.0000 mg | DELAYED_RELEASE_TABLET | Freq: Every day | ORAL | Status: DC

## 2018-07-22 MED ORDER — PHENYLEPHRINE HCL-NACL 10-0.9 MG/250ML-% IV SOLN: 0.0000 ug/min | INTRAVENOUS | Status: DC

## 2018-07-22 NOTE — ED Notes (Signed)
ED TO INPATIENT HANDOFF REPORT  ED Nurse Name and Phone #:  Darnell LevelMandi  S Name/Age/Gender Hailey Hughes 78 y.o. female Room/Bed: WA20/WA20  Code Status   Code Status: Full Code  Home/SNF/Other Home Patient oriented WU:[id number]:self, person, place, time Is this baseline? Yes   Triage Complete: Triage complete  Chief Complaint Abdominal Pain  Triage Note Per EMS - Pt coming from home with CC of abd pain. Constipation x "a couple of days". Tonight around 11;15 had a very small hard stool. This evening gave herself a water enema x 2  with no relief. Abd pain started approx 10pm. N/V started approx 11;30pm. Episodes of emesis with EMS. 10/10 constant pain. No tenderness. Upon arrival of of EMS pt was cool and clammy. Pt reports hx of bowel impaction and GI problems.    76/42 laying - 56/38 sitting - 94/46 after fluids and trendelenburg  HR 88 NSR R 24 94% RA  CBG 259  20g lt forearm   800cc NS  4 zofran - with no relief from nausea    Allergies Allergies  Allergen Reactions  . Bee Venom Anaphylaxis  . Epinephrine Other (See Comments)    REACTION: Nervousness     Level of Care/Admitting Diagnosis ED Disposition    ED Disposition Condition Comment   Admit  [facility] [[id number]]  Level of Care: Telemetry [5]  Admit to tele based on following criteria: Monitor for Ischemic changes  Covid Evaluation: Screening Protocol (No Symptoms)  Diagnosis: Colitis [[id number]]  Admitting Physician: Darlin DropHALL, CAROLE N [[id number]]  Attending Physician: Darlin DropHALL, CAROLE N [[id number]]  Estimated length of stay: past midnight tomorrow  Certification:: I certify this patient will need inpatient services for at least 2 midnights  PT Class (Do Not Modify): Inpatient [101]  PT Acc Code (Do Not Modify): Private [1]       B Medical/Surgery History Past Medical History:  Diagnosis Date  . Depression   . Diverticulosis   . DM2 (diabetes mellitus, type 2) (HCC)   . Hiatal  hernia   . HTN (hypertension)   . Hyperlipidemia   . Hypothyroidism   . Imperforate anus   . Obesity   . OSA (obstructive sleep apnea)   . Palpitations   . Reflux esophagitis    Past Surgical History:  Procedure Laterality Date  . APPENDECTOMY    . CARPAL TUNNEL RELEASE Right   . COLON RESECTION     age 78  . 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 CYSTECTOMY     spinal  . RECTOVAGINAL FISTULA CLOSURE     congenital     A IV Location/Drains/Wounds Patient Lines/Drains/Airways Status   Active Line/Drains/Airways    Name:   Placement date:   Placement time:   Site:   Days:   Peripheral IV 07/22/18 Left Forearm   07/22/18    0144    Forearm   less than 1   Peripheral IV 07/22/18 Right Antecubital   07/22/18    0300    Antecubital   less than 1          Intake/Output Last 24 hours  Intake/Output Summary (Last 24 hours) at 07/22/2018 1430 Last data filed at 07/22/2018 0135 Gross per 24 hour  Intake 800 ml  Output -  Net 800 ml    Labs/Imaging Results for orders placed or performed during the hospital encounter of 07/22/18 (from the past 48 hour(s))  POC occult blood,  ED Provider will collect     Status: Abnormal   Collection Time: 07/22/18  2:45 AM  Result Value Ref Range   Fecal Occult Bld POSITIVE (A) NEGATIVE  Hemoglobin A1c     Status: Abnormal   Collection Time: 07/22/18  2:45 AM  Result Value Ref Range   Hgb A1c MFr Bld 7.0 (H) 4.8 - 5.6 %    Comment: (NOTE) Pre diabetes:          5.7%-6.4% Diabetes:              >6.4% Glycemic control for   <7.0% adults with diabetes    Mean Plasma Glucose 154.2 mg/dL    Comment: Performed at [facility] [address], [place], [place] [address]  Comprehensive metabolic panel     Status: Abnormal   Collection Time: 07/22/18  2:47 AM  Result Value Ref Range   Sodium 140 135 - 145 mmol/L   Potassium 3.6 3.5 - 5.1 mmol/L   Chloride 108 98 - 111 mmol/L   CO2 18 (L) 22 - 32 mmol/L   Glucose, Bld 215 (H)  70 - 99 mg/dL   BUN 42 (H) 8 - 23 mg/dL   Creatinine, Ser 2.01 (H) 0.44 - 1.00 mg/dL   Calcium 9.0 8.9 - 10.3 mg/dL   Total Protein 6.2 (L) 6.5 - 8.1 g/dL   Albumin 3.8 3.5 - 5.0 g/dL   AST 36 15 - 41 U/L   ALT 26 0 - 44 U/L   Alkaline Phosphatase 60 38 - 126 U/L   Total Bilirubin 0.7 0.3 - 1.2 mg/dL   GFR calc non Af Amer 23 (L) >60 mL/min   GFR calc Af Amer 26 (L) >60 mL/min   Anion gap 14 5 - 15    Comment: Performed at [facility], [place] [address], [place], [place] [address]  Lipase, blood     Status: Abnormal   Collection Time: 07/22/18  2:47 AM  Result Value Ref Range   Lipase 55 (H) 11 - 51 U/L    Comment: Performed at [facility] [address], [place], [place] [address]  CBC with Differential     Status: Abnormal   Collection Time: 07/22/18  2:47 AM  Result Value Ref Range   WBC 17.6 (H) 4.0 - 10.5 K/uL   RBC 5.07 3.87 - 5.11 MIL/uL   Hemoglobin 15.0 12.0 - 15.0 g/dL   HCT 46.9 (H) 36.0 - 46.0 %   MCV 92.5 80.0 - 100.0 fL   MCH 29.6 26.0 - 34.0 pg   MCHC 32.0 30.0 - 36.0 g/dL   RDW 14.2 11.5 - 15.5 %   Platelets 191 150 - 400 K/uL   nRBC 0.0 0.0 - 0.2 %   Neutrophils Relative % 91 %   Neutro Abs 15.9 (H) 1.7 - 7.7 K/uL   Lymphocytes Relative 7 %   Lymphs Abs 1.2 0.7 - 4.0 K/uL   Monocytes Relative 2 %   Monocytes Absolute 0.3 0.1 - 1.0 K/uL   Eosinophils Relative 0 %   Eosinophils Absolute 0.1 0.0 - 0.5 K/uL   Basophils Relative 0 %   Basophils Absolute 0.1 0.0 - 0.1 K/uL   Immature Granulocytes 0 %   Abs Immature Granulocytes 0.06 0.00 - 0.07 K/uL    Comment: Performed at [facility], [place] [address], [place], [place] [address]  Lactic acid, plasma     Status: Abnormal   Collection Time:  07/22/18  2:47 AM  Result Value Ref Range   Lactic Acid, Venous 5.0 (HH) 0.5 - 1.9 mmol/L    Comment: CRITICAL RESULT CALLED TO, READ BACK BY AND VERIFIED WITH: RN Shelly Bombard AT 2229 07/22/18 CRUICKSHANK A Performed  at [facility], [address], [place], [place] [address]   Urinalysis, Routine w reflex microscopic     Status: Abnormal   Collection Time: 07/22/18  2:47 AM  Result Value Ref Range   Color, Urine AMBER (A) YELLOW    Comment: BIOCHEMICALS MAY BE AFFECTED BY COLOR   APPearance CLOUDY (A) CLEAR   Specific Gravity, Urine 1.013 1.005 - 1.030   pH 5.0 5.0 - 8.0   Glucose, UA NEGATIVE NEGATIVE mg/dL   Hgb urine dipstick SMALL (A) NEGATIVE   Bilirubin Urine NEGATIVE NEGATIVE   Ketones, ur 5 (A) NEGATIVE mg/dL   Protein, ur 119 (A) NEGATIVE mg/dL   Nitrite NEGATIVE NEGATIVE   Leukocytes,Ua SMALL (A) NEGATIVE   RBC / HPF 6-10 0 - 5 RBC/hpf   WBC, UA >50 (H) 0 - 5 WBC/hpf   Bacteria, UA MANY (A) NONE SEEN   Squamous Epithelial / LPF 11-20 0 - 5   WBC Clumps PRESENT    Mucus PRESENT    Hyaline Casts, UA PRESENT    Amorphous Crystal PRESENT     Comment: Performed at [facility], [address], [place], [place] [address]  Lactic acid, plasma     Status: Abnormal   Collection Time: 07/22/18  5:48 AM  Result Value Ref Range   Lactic Acid, Venous 5.0 (HH) 0.5 - 1.9 mmol/L    Comment: CRITICAL RESULT CALLED TO, READ BACK BY AND VERIFIED WITH: OXENDINE,J.RN AT 8144 07/22/18 MULLINS,T Performed at [facility], [address] [place], [place] [address]   Lactic acid, plasma     Status: Abnormal   Collection Time: 07/22/18  7:38 AM  Result Value Ref Range   Lactic Acid, Venous 4.0 (HH) 0.5 - 1.9 mmol/L    Comment: CRITICAL RESULT CALLED TO, READ BACK BY AND VERIFIED WITHAugusto Garbe RN AT [phone] 07/22/18 MULLINS,T Performed at [facility], [address], [place], [place] [address]   SARS Coronavirus 2 (CEPHEID - Performed in [facility] hospital lab), Hosp Order     Status: None   Collection Time: 07/22/18  9:32 AM  Result Value Ref Range   SARS Coronavirus 2 NEGATIVE NEGATIVE    Comment: (NOTE) If result is  NEGATIVE SARS-CoV-2 target nucleic acids are NOT DETECTED. The SARS-CoV-2 RNA is generally detectable in upper and lower  respiratory specimens during the acute phase of infection. The lowest  concentration of SARS-CoV-2 viral copies this assay can detect is 250  copies / mL. A negative result does not preclude SARS-CoV-2 infection  and should not be used as the sole basis for treatment or other  patient management decisions.  A negative result may occur with  improper specimen collection / handling, submission of specimen other  than nasopharyngeal swab, presence of viral mutation(s) within the  areas targeted by this assay, and inadequate number of viral copies  (<250 copies / mL). A negative result must be combined with clinical  observations, patient history, and epidemiological information. If result is POSITIVE SARS-CoV-2 target nucleic acids are DETECTED. The SARS-CoV-2 RNA is generally detectable in upper and lower  respiratory specimens dur ing the acute phase of infection.  Positive  results are indicative of active  infection with SARS-CoV-2.  Clinical  correlation with patient history and other diagnostic information is  necessary to determine patient infection status.  Positive results do  not rule out bacterial infection or co-infection with other viruses. If result is PRESUMPTIVE POSTIVE SARS-CoV-2 nucleic acids MAY BE PRESENT.   A presumptive positive result was obtained on the submitted specimen  and confirmed on repeat testing.  While 2019 novel coronavirus  (SARS-CoV-2) nucleic acids may be present in the submitted sample  additional confirmatory testing may be necessary for epidemiological  and / or clinical management purposes  to differentiate between  SARS-CoV-2 and other Sarbecovirus currently known to infect humans.  If clinically indicated additional testing with an alternate test  methodology [phone]([id number]) is advised. The SARS-CoV-2 RNA is generally  detectable  in upper and lower respiratory sp ecimens during the acute  phase of infection. The expected result is Negative. Fact Sheet for Patients:  BoilerBrush.com.cyhttps://www.fda.gov/media/136312/download Fact Sheet for Healthcare Providers: https://pope.com/https://www.fda.gov/media/136313/download This test is not yet approved or cleared by the [place] FDA and has been authorized for detection and/or diagnosis of SARS-CoV-2 by FDA under an Emergency Use Authorization (EUA).  This EUA will remain in effect (meaning this test can be used) for the duration of the COVID-19 declaration under Section 564(b)(1) of the Act, 21 U.S.C. section 360bbb-3(b)(1), unless the authorization is terminated or revoked sooner. Performed at [facility], [address] [place], [place] [address]   CBG monitoring, ED     Status: Abnormal   Collection Time: 07/22/18 12:31 PM  Result Value Ref Range   Glucose-Capillary 152 (H) 70 - 99 mg/dL  Lactic acid, plasma     Status: Abnormal   Collection Time: 07/22/18 12:59 PM  Result Value Ref Range   Lactic Acid, Venous 5.0 (HH) 0.5 - 1.9 mmol/L    Comment: CRITICAL RESULT CALLED TO, READ BACK BY AND VERIFIED WITH: BINGHAM,S. RN @1350  ON 06.09.2020 BY COHEN,K Performed at [facility], [address] [place], [place] [address]    Ct Abdomen Pelvis Wo Contrast  Result Date: 07/22/2018 CLINICAL DATA:  Abdominal pain for several days EXAM: CT ABDOMEN AND PELVIS WITHOUT CONTRAST TECHNIQUE: Multidetector CT imaging of the abdomen and pelvis was performed following the standard protocol without IV contrast. COMPARISON:  MRI from 06/19/2008 FINDINGS: Lower chest: Dependent atelectatic changes are noted. Hepatobiliary: Liver is well visualized and within normal limits. A mild amount of perihepatic ascites is noted. The gallbladder is well distended without significant inflammatory change. Pancreas: Unremarkable. No pancreatic ductal dilatation or surrounding  inflammatory changes. Spleen: Normal in size without focal abnormality. Adrenals/Urinary Tract: Adrenal glands are within normal limits. Kidneys are well visualized bilaterally. 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 No obstructive changes are seen. The ureters and urinary bladder are within normal limits. Stomach/Bowel: Changes of reflux are noted. Small hiatal hernia is noted. Colon is distended with fluid and fecal material although no obstructive lesion is seen. Very mild pericolonic inflammatory changes are noted in the distal transverse colon. This may represent a generalized colitis. No small bowel abnormality is seen. Vascular/Lymphatic: Aortic atherosclerosis. No enlarged abdominal or pelvic lymph nodes. Reproductive: Uterus and bilateral adnexa are unremarkable. Other: Minimal free fluid is noted within the pelvis. Postsurgical changes are noted in the anterior abdominal wall consistent with the given clinical history. Musculoskeletal: Degenerative changes are noted. IMPRESSION: Mild dependent atelectatic changes. Well distended gallbladder without definitive signs of inflammatory change. Colon is mildly distended with fluid and fecal material  with very mild pericolonic changes which may represent a generalized colitis. Mild ascites Mild hiatal hernia. Stable right renal angiomyolipoma. Electronically Signed   By: Alcide Clever [name]   On: 07/22/2018 07:33    Pending Labs Unresulted Labs (From admission, onward)    Start     Ordered   07/23/18 0500  Comprehensive metabolic panel  Tomorrow morning,   R     07/22/18 0950   07/23/18 0500  CBC with Differential/Platelet  Tomorrow morning,   R     07/22/18 0950   07/23/18 0500  Lipase, blood  Tomorrow morning,   R     07/22/18 1340   07/22/18 1349  TSH  Add-on,   R     07/22/18 1348   07/22/18 1100  Lactic acid, plasma  STAT Now then every 3 hours,   R     07/22/18 0951   07/22/18 0953  Culture, blood (routine x 2)  BLOOD CULTURE X 2,   R     Question:  Patient immune status  Answer:  Normal   07/22/18 0952   07/22/18 0810  Urine Culture  Add-on,   STAT     07/22/18 0809          Vitals/Pain Today's Vitals   07/22/18 1300 07/22/18 1345 07/22/18 1415 07/22/18 1418  BP: (!) 90/53 (!) 96/49 (!) 98/51   Pulse: 81 73 73 74  Resp: (!) 22 17 (!) 22 19  Temp:      TempSrc:      SpO2: 95% 94% 94% 94%  Weight:      Height:      PainSc:        Isolation Precautions No active isolations  Medications Medications  HYDROmorphone (DILAUDID) injection 0.5 mg (has no administration in time range)  oxyCODONE (Oxy IR/ROXICODONE) immediate release tablet 5 mg (5 mg Oral Given 07/22/18 1411)  0.9 %  sodium chloride infusion ( Intravenous Transfusing/Transfer 07/22/18 1020)  senna-docusate (Senokot-S) tablet 2 tablet (2 tablets Oral Given 07/22/18 1209)  polyethylene glycol (MIRALAX / GLYCOLAX) packet 17 g (17 g Oral Not Given 07/22/18 1211)  piperacillin-tazobactam (ZOSYN) IVPB 3.375 g (has no administration in time range)  insulin aspart (novoLOG) injection 0-9 Units (0 Units Subcutaneous Not Given 07/22/18 1230)  insulin aspart (novoLOG) injection 0-5 Units (has no administration in time range)  aspirin tablet 81 mg (has no administration in time range)  atorvastatin (LIPITOR) tablet 20 mg (has no administration in time range)  bumetanide (BUMEX) tablet 2 mg (has no administration in time range)  febuxostat (ULORIC) tablet 40 mg (has no administration in time range)  loratadine (CLARITIN) tablet 10 mg (has no administration in time range)  fluticasone (FLONASE) 50 MCG/ACT nasal spray 2 spray (has no administration in time range)  levothyroxine (SYNTHROID) tablet 112 mcg (has no administration in time range)  montelukast (SINGULAIR) tablet 10 mg (has no administration in time range)  olopatadine (PATANOL) 0.1 % ophthalmic solution 1 drop (has no administration in time range)  pantoprazole (PROTONIX) EC tablet 40 mg (has no administration  in time range)  tobramycin-dexamethasone (TOBRADEX) ophthalmic ointment 1 application (has no administration in time range)  celecoxib (CELEBREX) capsule 100 mg (has no administration in time range)  heparin injection 5,000 Units (has no administration in time range)  acetaminophen (TYLENOL) tablet 650 mg (has no administration in time range)  ondansetron (ZOFRAN) injection 4 mg (has no administration in time range)  sodium chloride 0.9 % bolus 1,000 mL (0  mLs Intravenous Stopped 07/22/18 0344)  ondansetron (ZOFRAN) injection 4 mg (4 mg Intravenous Given 07/22/18 0239)  fentaNYL (SUBLIMAZE) injection 50 mcg (50 mcg Intravenous Given 07/22/18 0236)  morphine 4 MG/ML injection 4 mg (4 mg Intravenous Given 07/22/18 0358)  sodium chloride 0.9 % bolus 1,000 mL (0 mLs Intravenous Stopped 07/22/18 0523)  iohexol (OMNIPAQUE) 300 MG/ML solution 30 mL (30 mLs Oral Contrast Given 07/22/18 0406)  metoCLOPramide (REGLAN) injection 10 mg (10 mg Intravenous Given 07/22/18 0413)  diphenhydrAMINE (BENADRYL) injection 25 mg (25 mg Intravenous Given 07/22/18 0413)  morphine 4 MG/ML injection 4 mg (4 mg Intravenous Given 07/22/18 0611)  sodium chloride 0.9 % bolus 1,000 mL (0 mLs Intravenous Stopped 07/22/18 0806)  morphine 4 MG/ML injection 4 mg (4 mg Intravenous Given 07/22/18 0737)  prochlorperazine (COMPAZINE) injection 10 mg (10 mg Intravenous Given 07/22/18 0737)  ciprofloxacin (CIPRO) IVPB 400 mg (0 mg Intravenous Stopped 07/22/18 0921)  metroNIDAZOLE (FLAGYL) IVPB 500 mg (0 mg Intravenous Stopped 07/22/18 1024)  piperacillin-tazobactam (ZOSYN) IVPB 3.375 g (0 g Intravenous Stopped 07/22/18 1239)    Mobility walks with device Moderate fall risk   Focused Assessments Gastrointestinal   R Recommendations: See Admitting Provider Note  Report given to:   Additional Notes:

## 2018-07-22 NOTE — Consult Note (Signed)
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  She has had constipation and not actually had diarrhea.  She was transferred to the ICU this evening due to progressive hypotension placed on Neo-Synephrine.  She is COVID negative.  On my evaluation she is awake she has slightly slurred speech but otherwise is conversant.  She complains that her abdomen is still fairly tender but perhaps slightly improved.  She is on 180 mics of Neo-Synephrine.  She just received a fluid bolus for lactic acid of 4.2.  She has been afebrile.  CT scan of the abdomen was only pertinent for colitis.  Her lipase done this morning was 55.  During this admission her creatinine has increased to 2.66.  Anion gap now is 13 as of this evening.  White count is 13.9 hemoglobin is 11.6.  I have approached the patient to let us put in a central venous line although she does not want to right now.  I explained to her that if her pressure drops any lower were going to be forced to urgently.  She understands.  Past Medical History    Depression    Diverticulosis    DM2 (diabetes mellitus, type 2) ([place])    Hiatal hernia    HTN (hypertension)    Hyperlipidemia    Hypothyroidism    Imperforate anus    Obesity    OSA (obstructive sleep apnea)    Palpitations    Reflux esophagitis      APPENDECTOMY     CARPAL TUNNEL RELEASE Right    COLON RESECTION     age 42   COLOSTOMY     age 47 due to imperforate anus   CYSTECTOMY     spinal   RECTOVAGINAL FISTULA CLOSURE     congenital      Significant Hospital Events   Admission 07/22/2018 Is for ICU 07/22/2018  Consults:  NA  Procedures:  NA  Significant Diagnostic Tests:  NA  Micro Data:  Blood and urine cultures unremarkable  Antimicrobials:  Zosyn and Flagyl  Interim history/subjective:  NA  Objective   Blood pressure (!) 108/54, pulse 77, temperature (!) 97.5 F (36.4 C), temperature source Oral, resp. rate 17, height 5\' 2"  (1.575 m), weight 90.7 kg, SpO2 92 %.        Intake/Output Summary (Last 24 hours) at 07/22/2018 2255 Last data filed at 07/22/2018 1942 Gross per 24 hour  Intake 1300 ml  Output 0 ml  Net 1300 ml   Filed Weights   07/22/18 0148  Weight: 90.7 kg    Examination: General: Comfortable white female in no distress HENT: Within normal limits Lungs: Clear Cardiovascular: Regular Abdomen: Tender to gentle palpation but not rigid bowel sounds are present Extremities: Within normal limits Neuro: Nonfocal awake oriented GU: N/A  Resolved Hospital Problem list   NA  Assessment & Plan:  1.  Sepsis with elevated lactic acidosis hypotension presumably  secondary to [place] noted on CT scan blood cultures negative with elevated CBC anion gap of 13  2.  Lactic acidosis  3.  Acute kidney injury: Baseline creatinine approximately 1.3 current creatinine 2.66 urine output minimal  4.  Type 2 diabetes mellitus  5.  Urinalysis suggests UTI culture pending  6.  Elevated lipase    Best practice:  Diet: h20 Pain/Anxiety/Delirium protocol (if indicated): na VAP protocol (if indicated): na DVT prophylaxis: scd GI prophylaxis: protonix Glucose control: sliding scale Mobility: bed Code Status: full Family Communication: discussed with patient Disposition:    Labs   CBC: Recent Labs  Lab 07/22/18 0247 07/22/18 2036  WBC 17.6* 13.9*  NEUTROABS 15.9* 11.6*  HGB 15.0 11.6*  HCT 46.9* 39.9  MCV 92.5 100.8*  PLT 191 130*    Basic Metabolic Panel: Recent Labs  Lab 07/22/18 0247 07/22/18 2036  NA 140 139  K 3.6 5.1  CL 108 113*  CO2 18* 13*  GLUCOSE 215* 148*  BUN 42* 52*  CREATININE 2.01* 2.66*  CALCIUM 9.0 7.3*   GFR: Estimated Creatinine Clearance: 17.7 mL/min (A) (by C-G formula based on SCr of 2.66 mg/dL (H)). Recent Labs  Lab 07/22/18 0247  07/22/18 0738 07/22/18 1259 07/22/18 1802 07/22/18 2036  WBC 17.6*  --   --   --   --  13.9*  LATICACIDVEN 5.0*   < > 4.0* 5.0* 5.4* 4.2*   < > = values in this interval not displayed.    Liver Function Tests: Recent Labs  Lab 07/22/18 0247 07/22/18 2036  AST 36 65*  ALT 26 47*  ALKPHOS 60 41  BILITOT 0.7 0.6  PROT 6.2* 4.6*  ALBUMIN 3.8 2.6*   Recent Labs  Lab 07/22/18 0247  LIPASE 55*   No results for input(s): AMMONIA in the last 168 hours.  ABG No results found for: PHART, PCO2ART, PO2ART, HCO3, TCO2, ACIDBASEDEF, O2SAT   Coagulation Profile: No results for input(s): INR, PROTIME in the last 168 hours.  Cardiac Enzymes: No results for input(s): CKTOTAL, CKMB, CKMBINDEX, TROPONINI in the last 168 hours.  HbA1C: Hgb A1c MFr Bld  Date/Time Value Ref Range Status  07/22/2018 02:45 AM 7.0 (H) 4.8 - 5.6 % Final    Comment:    (NOTE) Pre diabetes:          5.7%-6.4% Diabetes:              >6.4% Glycemic control for   <7.0% adults with diabetes     CBG: Recent Labs  Lab 07/22/18 1231 07/22/18 1738  GLUCAP 152* 131*    Review of Systems:   Abdominal pain, mild nausea otherwise neg  Past Medical History  She,  has a past medical history of Depression, Diverticulosis, DM2 (diabetes mellitus, type 2) ([facility]), Hiatal hernia, HTN (hypertension), Hyperlipidemia, Hypothyroidism, Imperforate anus, Obesity, OSA (obstructive sleep apnea), Palpitations,  and Reflux esophagitis.   Surgical History    Past Surgical History:  Procedure Laterality Date   APPENDECTOMY     CARPAL TUNNEL RELEASE Right    COLON RESECTION     age 89   COLOSTOMY     age 53 due to imperforate anus   CYSTECTOMY     spinal   RECTOVAGINAL FISTULA CLOSURE     congenital     Social History   reports that she has never smoked. She has never used smokeless tobacco. She reports that she does not drink alcohol or use drugs.   Family History  Her family history includes Colon cancer in her maternal uncle; Colon polyps in her sister; Diabetes in her sister; Heart attack in her father; Heart disease in her brother; Hypertension in her father and mother. There is no history of Esophageal cancer or Kidney disease.   Allergies Allergies  Allergen Reactions   Bee Venom Anaphylaxis   Epinephrine Other (See Comments)    REACTION: Nervousness      Home Medications  Prior to Admission medications   Medication Sig Start Date End Date Taking? Authorizing Provider  amLODipine-benazepril (LOTREL) 5-20 MG per capsule Take 1 capsule by mouth every evening.    Yes [provider]  aspirin 81 MG tablet Take 81 mg by mouth every evening.    Yes [provider]  atorvastatin (LIPITOR) 20 MG tablet Take 20 mg by mouth every evening.    Yes [provider]  bumetanide (BUMEX) 2 MG tablet Take 2 mg by mouth every evening.    Yes [provider]  celecoxib (CELEBREX) 200 MG capsule Take 200 mg by mouth every evening.    Yes [provider]  febuxostat (ULORIC) 40 MG tablet Take 40 mg by mouth every evening. 12/26/17  Yes [provider]  fexofenadine (ALLEGRA) 180 MG tablet Take 180 mg by mouth daily as needed for allergies.    Yes [provider]  fluticasone (FLONASE) 50 MCG/ACT nasal spray Place 2 sprays into both nostrils daily as needed for allergies.  12/30/17  Yes [provider]   HYDROcodone-acetaminophen (NORCO) 7.5-325 MG tablet Take 1 tablet by mouth 3 (three) times daily as needed. 02/18/18  Yes [provider]  levothyroxine (SYNTHROID, LEVOTHROID) 112 MCG tablet Take 112 mcg by mouth daily.   Yes [provider]  metFORMIN (GLUCOPHAGE-XR) 500 MG 24 hr tablet Take 1,000 mg by mouth every evening.  06/26/17  Yes [provider]  metoprolol succinate (TOPROL-XL) 50 MG 24 hr tablet Take 50 mg by mouth daily. 12/30/17  Yes [provider]  montelukast (SINGULAIR) 10 MG tablet Take 10 mg by mouth daily as needed (allergies).    Yes [provider]  olopatadine (PATANOL) 0.1 % ophthalmic solution Place 1 drop into both eyes daily as needed for allergies.  12/30/17  Yes [provider]  pantoprazole (PROTONIX) 40 MG tablet Take 1 tablet (40 mg total) by mouth daily. 02/09/14  Yes Hart Carwin, MD  senna (SENOKOT) 8.6 MG tablet Take 1 tablet by mouth daily as needed for constipation.   Yes [provider]  tiZANidine (ZANAFLEX) 4 MG capsule Take 4 mg by mouth 2 (two) times daily as needed for muscle spasms.    Yes [provider]  tobramycin-dexamethasone (TOBRADEX) ophthalmic ointment Place 1 application into both eyes 2 (two) times daily.   Yes [provider]  triamcinolone cream (KENALOG) 0.1 % Apply 1 application topically daily as needed (ezcema).  05/29/17  Yes [provider]  colestipol (COLESTID) 1 G tablet Take 2 tablets by mouth every morning and 1 tablet by mouth every evening. Patient not taking: Reported on 07/22/2018 02/09/14   Hart Carwin, MD     Critical care time: 35 minutes spent in chart review bedside evaluation discussing case with patient's nurse.

## 2018-07-22 NOTE — Progress Notes (Signed)
CRITICAL VALUE ALERT  Critical Value:  Lactic Acid 4.2  Date & Time Notied:  07/22/18 2110  Provider Notified: Baltazar Najjar  Orders Received/Actions taken: 500 cc bolus  Mariann Laster RN

## 2018-07-22 NOTE — ED Notes (Signed)
Contact son with updates [phone]

## 2018-07-22 NOTE — ED Notes (Signed)
Pt provided with lunch tray.

## 2018-07-22 NOTE — H&P (Addendum)
History and Physical  Hailey SanesVallie S Masullo WUJ:[id number] DOB: 24-Jun-1940 DOA: 07/22/2018  Referring physician: Dr Preston FleetingGlick PCP: Barbie BannerWilson, Fred H, MD  Outpatient Specialists: None Patient coming from: Home  Chief Complaint: Abdominal pain, nausea and vomiting  HPI: Hailey Hughes is a [age] y.o. 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  States she has been experiencing generalized weakness and thought it was due to constipation of 3 days duration.  Had an enema with minimal hard stool output yesterday.  Since then has been experiencing worsening abdominal cramping with nausea and vomiting x2.  Denies fevers or chills.  Due to worsening abdominal cramping and recurrent vomiting decided to come to the ED for further evaluation.  Denies any sick contacts or known COVID-19 exposure.  States she has never experienced the severity of her abdominal pain.  ED Course: Upon presentation to the ED patient hypotensive, afebrile.  Lab studies remarkable for lactic acidosis with lactic acid 5.0 metabolic acidosis with bicarb of 18, elevated creatinine and leukocytosis.  Positive UA.  CT abdomen and pelvis without contrast (AKI) showed concern for generalized colitis.  TR[facility] asked to admit.  Review of Systems: Review of systems as noted in the HPI. All other systems reviewed and are negative.   Past Medical History:  Diagnosis Date   Depression    Diverticulosis    DM2 (diabetes mellitus, type 2) (H[facility])    Hiatal hernia    HTN (hypertension)    Hyperlipidemia    Hypothyroidism    Imperforate anus    Obesity    OSA (obstructive sleep apnea)    Palpitations    Reflux esophagitis    Past Surgical History:  Procedure Laterality Date   APPENDECTOMY     CARPAL TUNNEL  RELEASE Right    COLON RESECTION     age [age]   COLOSTOMY     age 78 due to imperforate anus   CYSTECTOMY     spinal   RECTOVAGINAL FISTULA CLOSURE     congenital    Social History:  reports that she has never smoked. She has never used smokeless tobacco. She reports that she does not drink alcohol or use drugs.   Allergies  Allergen Reactions   Bee Venom Anaphylaxis   Epinephrine Other (See Comments)    REACTION: Nervousness     Family History  Problem Relation Age of Onset   Heart attack Father    Hypertension Father    Hypertension Mother    Diabetes Sister    Colon cancer Maternal Uncle    Heart disease Brother    Colon polyps Sister    Esophageal cancer Neg Hx    Kidney disease Neg Hx       Prior to Admission medications   Medication Sig Start Date End Date Taking? Authorizing Provider  amLODipine-benazepril (LOTREL) 5-20 MG per capsule Take 1 capsule by mouth every evening.    Yes [provider]  aspirin 81 MG tablet Take 81 mg by mouth every evening.    Yes [provider]  atorvastatin (LIPITOR) 20 MG tablet Take 20 mg by mouth every evening.    Yes [provider]  bumetanide (BUMEX) 2 MG tablet Take 2 mg by mouth every evening.    Yes [provider]  celecoxib (CELEBREX) 200 MG  capsule Take 200 mg by mouth every evening.    Yes [provider]  febuxostat (ULORIC) 40 MG tablet Take 40 mg by mouth every evening. 12/26/17  Yes [provider]  fexofenadine (ALLEGRA) 180 MG tablet Take 180 mg by mouth daily as needed for allergies.    Yes [provider]  fluticasone (FLONASE) 50 MCG/ACT nasal spray Place 2 sprays into both nostrils daily as needed for allergies.  12/30/17  Yes [provider]  HYDROcodone-acetaminophen (NORCO) 7.5-325 MG tablet Take 1 tablet by mouth 3 (three) times daily as needed. 02/18/18  Yes [provider]  levothyroxine (SYNTHROID, LEVOTHROID) 112  MCG tablet Take 112 mcg by mouth daily.   Yes [provider]  metFORMIN (GLUCOPHAGE-XR) 500 MG 24 hr tablet Take 1,000 mg by mouth every evening.  06/26/17  Yes [provider]  metoprolol succinate (TOPROL-XL) 50 MG 24 hr tablet Take 50 mg by mouth daily. 12/30/17  Yes [provider]  montelukast (SINGULAIR) 10 MG tablet Take 10 mg by mouth daily as needed (allergies).    Yes [provider]  olopatadine (PATANOL) 0.1 % ophthalmic solution Place 1 drop into both eyes daily as needed for allergies.  12/30/17  Yes [provider]  pantoprazole (PROTONIX) 40 MG tablet Take 1 tablet (40 mg total) by mouth daily. 02/09/14  Yes Hart Carwin, MD  senna (SENOKOT) 8.6 MG tablet Take 1 tablet by mouth daily as needed for constipation.   Yes [provider]  tiZANidine (ZANAFLEX) 4 MG capsule Take 4 mg by mouth 2 (two) times daily as needed for muscle spasms.    Yes [provider]  tobramycin-dexamethasone (TOBRADEX) ophthalmic ointment Place 1 application into both eyes 2 (two) times daily.   Yes [provider]  triamcinolone cream (KENALOG) 0.1 % Apply 1 application topically daily as needed (ezcema).  05/29/17  Yes [provider]  colestipol (COLESTID) 1 G tablet Take 2 tablets by mouth every morning and 1 tablet by mouth every evening. Patient not taking: Reported on 07/22/2018 02/09/14   Hart Carwin, MD    Physical Exam: BP (!) 90/52    Pulse 68    Temp (!) 97.4 F (36.3 C) (Oral)    Resp 16    Ht 5\' 2"  (1.575 m)    Wt 90.7 kg    LMP  (LMP Unknown)    SpO2 95%    BMI 36.58 kg/m    General: 78 y.o. year-old female female well developed well nourished in no acute distress.  Alert and oriented x3.  Cardiovascular: Regular rate and rhythm with no rubs or gallops.  No thyromegaly or JVD noted.  No lower extremity edema. 2/4 pulses in all 4 extremities.  Respiratory: Clear to auscultation with no wheezes or rales. Good  inspiratory effort.  Abdomen: Soft diffused tenderness on palpation, nondistended with hypoactive bowel sounds x4 quadrants. abdominal scar from prior surgery.  Muskuloskeletal: No cyanosis, clubbing or edema noted bilaterally  Neuro: CN II-XII intact, strength, sensation, reflexes  Skin: No ulcerative lesions noted or rashes  Psychiatry: Judgement and insight appear normal. Mood is appropriate for condition and setting          Labs on Admission:  Basic Metabolic Panel: Recent Labs  Lab 07/22/18 0247  NA 140  K 3.6  CL 108  CO2 18*  GLUCOSE 215*  BUN 42*  CREATININE 2.01*  CALCIUM 9.0   Liver Function Tests: Recent Labs  Lab 07/22/18 0247  AST 36  ALT 26  ALKPHOS 60  BILITOT 0.7  PROT 6.2*  ALBUMIN 3.8   Recent Labs  Lab 07/22/18 0247  LIPASE 55*   No results for input(s): AMMONIA in the last 168 hours. CBC: Recent Labs  Lab 07/22/18 0247  WBC 17.6*  NEUTROABS 15.9*  HGB 15.0  HCT 46.9*  MCV 92.5  PLT 191   Cardiac Enzymes: No results for input(s): CKTOTAL, CKMB, CKMBINDEX, TROPONINI in the last 168 hours.  BNP (last 3 results) No results for input(s): BNP in the last 8760 hours.  ProBNP (last 3 results) No results for input(s): PROBNP in the last 8760 hours.  CBG: No results for input(s): GLUCAP in the last 168 hours.  Radiological Exams on Admission: Ct Abdomen Pelvis Wo Contrast  Result Date: 07/22/2018 CLINICAL DATA:  Abdominal pain for several days EXAM: CT ABDOMEN AND PELVIS WITHOUT CONTRAST TECHNIQUE: Multidetector CT imaging of the abdomen and pelvis was performed following the standard protocol without IV contrast. COMPARISON:  MRI from 06/19/2008 FINDINGS: Lower chest: Dependent atelectatic changes are noted. Hepatobiliary: Liver is well visualized and within normal limits. A mild amount of perihepatic ascites is noted. The gallbladder is well distended without significant inflammatory change. Pancreas: Unremarkable. No pancreatic  ductal dilatation or surrounding inflammatory changes. Spleen: Normal in size without focal abnormality. Adrenals/Urinary Tract: Adrenal glands are within normal limits. Kidneys are well visualized bilaterally. 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 No obstructive changes are seen. The ureters and urinary bladder are within normal limits. Stomach/Bowel: Changes of reflux are noted. Small hiatal hernia is noted. Colon is distended with fluid and fecal material although no obstructive lesion is seen. Very mild pericolonic inflammatory changes are noted in the distal transverse colon. This may represent a generalized colitis. No small bowel abnormality is seen. Vascular/Lymphatic: Aortic atherosclerosis. No enlarged abdominal or pelvic lymph nodes. Reproductive: Uterus and bilateral adnexa are unremarkable. Other: Minimal free fluid is noted within the pelvis. Postsurgical changes are noted in the anterior abdominal wall consistent with the given clinical history. Musculoskeletal: Degenerative changes are noted. IMPRESSION: Mild dependent atelectatic changes. Well distended gallbladder without definitive signs of inflammatory change. Colon is mildly distended with fluid and fecal material with very mild pericolonic changes which may represent a generalized colitis. Mild ascites Mild hiatal hernia. Stable right renal angiomyolipoma. Electronically Signed   By: Inez Catalina [name]   On: 07/22/2018 07:33    EKG: I independently viewed the EKG done and my findings are as followed: None available at the time of this visit.  Assessment/Plan Present on Admission:  Colitis  Active Problems:   Colitis  Sepsis secondary to generalized colitis Presented with significant abdominal pain, nausea and vomiting x1 day Lactic acidosis with lactic acid 4.0, WBC 17 K, hypotension with map of improved with IV fluid boluses CT abdomen and pelvis without contrast showed concern for generalized colitis. Completed IV Cipro and  Flagyl in the ED Started IV Zosyn Blood cultures x2 Positive FOBT Hemoglobin stable at 15.0 Continue IV fluid to maintain map greater than 65 Repeat CBC in the morning Continue to closely monitor vital signs  Lactic acidosis  Presented with lactic acid 5.0 Trend Continue IV fluid  AKI on CKD 3 Baseline creatinine appears to be 1.3 Creatinine on presentation 2.01 with GFR of 23 Avoid nephrotoxic agents Monitor urine output Continue IV fluid hydration  Anion gap metabolic acidosis Likely multifactorial secondary to lactic acidosis versus AKI versus other Repeat Bmet in the morning  Type  2 diabetes with hyperglycemia Obtain hemoglobin A1c Hold oral anti-glycemic medications Start insulin sliding scale  UTI UA positive for pyuria Reports generalized weakness Denies dysuria Obtain urine culture On Zosyn for colitis   Elevated lipase Presentation lipase 55 Repeat level in the morning Clear liquid diet as tolerated  Chronic constipation Start bowel regimen  Rheumatoid arthritis Resume home medications    Risks: High risk for decompensation due to sepsis secondary to colitis, lactic acidosis, multiple comorbidities and advanced age.  Patient will require least 2 midnights for further assessment and treatment of present condition.   DVT prophylaxis: Subcu heparin 3 times daily  Code Status: Full Code  Family Communication: We will call if okay with the patient  Disposition Plan: Admit to medical telemetry  Consults called: None  Admission status: Inpatient status    Darlin Drop MD [facility] Pager [phone]  If 7PM-7AM, please contact night-coverage [email or web address] Password [email or web address]  07/22/2018, 9:25 AM

## 2018-07-22 NOTE — Progress Notes (Signed)
Pharmacy Antibiotic Note  Hailey Hughes is a 78 y.o. female with hx colon resection for megacolon presented to the ED on 07/22/2018 with c/o abd pain. Abd CT on 6/9 showed findings with suspicion for colitis.  To start zosyn for intra abdominal infection.  - scr 2.01 (crck~23) - received 1 dose of cipro and flagyl in the ED this morning  Plan: - zosyn 3.375 gm IV x1 over 30 min, then 3.375 gm IV q8h (infuse over 4 hrs) - monitor renal function closely  _________________________________  Height: 5\' 2"  (157.5 cm) Weight: 200 lb (90.7 kg) IBW/kg (Calculated) : 50.1  Temp (24hrs), Avg:97.6 F (36.4 C), Min:97.4 F (36.3 C), Max:97.7 F (36.5 C)  Recent Labs  Lab 07/22/18 0247 07/22/18 0548 07/22/18 0738  WBC 17.6*  --   --   CREATININE 2.01*  --   --   LATICACIDVEN 5.0* 5.0* 4.0*    Estimated Creatinine Clearance: 23.4 mL/min (A) (by C-G formula based on SCr of 2.01 mg/dL (H)).    Allergies  Allergen Reactions  . Bee Venom Anaphylaxis  . Epinephrine Other (See Comments)    REACTION: Nervousness      Thank you for allowing pharmacy to be a part of this patient's care.  Lynelle Doctor 07/22/2018 9:47 AM

## 2018-07-22 NOTE — ED Notes (Signed)
Bed: WA20 Expected date:  Expected time:  Means of arrival:  Comments: 78 yo F/Constipation

## 2018-07-22 NOTE — Progress Notes (Signed)
Shift event: Called by RN because pt's LA rose to 5.4 and her BP was in the 60s. Sleepy but answers questions. NP to bedside.  S: Pt says she feels washed out. No chest pain, dizziness, or SOB. + abdominal pain, not worsening. Voided in ED, unsure as to amount.  O: Acutely ill appearing elderly female in NAD. Not toxic appearing. T 98.3Ax. BP 70/44 up to 80/45 back down to 59.  HR 70s. O2 sat 98% on 2L per [place]. She is awake and answers questions appropriately. Drifts off to sleep at times. Skin is slightly clammy, but not cool to touch. Card: RRR. S1S2 without MRG. Lungs: CTA. Abd: rounded. Soft. MOE x 4. Neuro is non focal.  A/P: 1. Hypotension due to lactic acidosis/sepsis/generalized colitis-per RN, pt received 4L IVF in ED. Getting 5th liter as bolus now along with 100cc/hr maintenance fluid. Transferred pt to ICU. After arrival, BP back in the upper 50s, so pt placed on Neo per PIV. Continue to trend LA. PCCM called. They will come and consult on pt.  2. Colitis-abx, WBCC 17,600. Repeat labs ordered.  3. AKI on CKD III-creat 2, r/p labs. On IVF.  4. DM II-last sugar 131.  5. UTI-on abx. Voided in ED, ? Amt. Bladder scan zero now.  Pt did not want anyone called and notified of status change.  CRITICAL CARE Performed by: Gardiner Barefoot Total critical care time: start 1950    End 2050. Total 60 mins.  Critical care time was exclusive of separately billable procedures and treating other patients. Critical care was necessary to treat or prevent imminent or life-threatening deterioration. Critical care was time spent personally by me on the following activities: development of treatment plan with patient and/or surrogate as well as nursing, discussions with consultants, evaluation of patient's response to treatment, examination of patient, obtaining history from patient or surrogate, ordering and performing treatments and interventions, ordering and review of laboratory studies, ordering and review  of radiographic studies, pulse oximetry and re-evaluation of patient's condition.

## 2018-07-22 NOTE — ED Notes (Signed)
Urine and culture sent to lab  

## 2018-07-22 NOTE — Progress Notes (Signed)
CRITICAL VALUE ALERT  Critical Value:  Lactic Acid 5.4  Date & Time Notied:  07/22/2018 1905  Provider Notified: Baltazar Najjar, NP  Orders Received/Actions taken: NP came to bedside to assess patient as BP was 69/43, HR 72 after receiving 250cc of the 500cc bolus that was ordered. Per report from ED, patient had already received 4L of boluses today. Patient with increased drowsiness. Rapid Response called to bedside as well.   Patient transferred to ICU.

## 2018-07-22 NOTE — Progress Notes (Signed)
Pharmacy Antibiotic Note  Hailey Hughes is a 78 y.o. female with hx colon resection for megacolon presented to the ED on 07/22/2018 with c/o abd pain. Abd CT on 6/9 showed findings with suspicion for colitis.  To start zosyn for intra abdominal infection.  - scr 2.01 (crck~23) , SCr increased this evening to 2.66, will adjust Zosyn - received 1 dose of cipro and flagyl in the ED this morning  Plan: - zosyn 3.375 gm q8 (ext infusion over 4 hr) adjusted to 2.25gm q6hr - monitor renal function closely  _________________________________  Height: 5\' 2"  (157.5 cm) Weight: 200 lb (90.7 kg) IBW/kg (Calculated) : 50.1  Temp (24hrs), Avg:97.5 F (36.4 C), Min:97.4 F (36.3 C), Max:97.7 F (36.5 C)  Recent Labs  Lab 07/22/18 0247 07/22/18 0548 07/22/18 0738 07/22/18 1259 07/22/18 1802 07/22/18 2036  WBC 17.6*  --   --   --   --  13.9*  CREATININE 2.01*  --   --   --   --  2.66*  LATICACIDVEN 5.0* 5.0* 4.0* 5.0* 5.4* 4.2*    Estimated Creatinine Clearance: 17.7 mL/min (A) (by C-G formula based on SCr of 2.66 mg/dL (H)).    Allergies  Allergen Reactions  . Bee Venom Anaphylaxis  . Epinephrine Other (See Comments)    REACTION: Nervousness    Antimicrobials this admission:  6/9 Cipro/flagyl x1 6/9 zosyn>>  Dose adjustments this admission:  6/9: Zosyn EI > 2.25gm q6  Microbiology results:  6/9 BCx x1set: sent 6/9 UCx: sent  6/9 Covid: neg  6/9 MRSA PCR: sent   Thank you for allowing pharmacy to be a part of this patient's care.  Minda Ditto PharmD Pager [phone] 07/22/2018, 10:50 PM

## 2018-07-22 NOTE — ED Notes (Signed)
Report called to Jessica, RN

## 2018-07-22 NOTE — Progress Notes (Signed)
RN report received, at shift change NS 500 ml bolus infusing, BP 69/43, MAP 63, patient on telemetry with HR 69, tachypnea with oxygen saturation 92% on 2 L [place], RR 22-23, lethargic but responds to voice, at 1942 rapid response nurse S. Odom, RN called. On call provider Tylene Fantasia NP at bedside to assess patient.  Interventions: 1L NS bolus infusing Patient transferred to step down with Temple Pacini, RN.

## 2018-07-22 NOTE — Significant Event (Signed)
Rapid Response Event Note  Overview: Time Called: 1942 Arrival Time: 1945 Event Type: Hypotension  Initial Focused Assessment: Patient lying in bed, eyes closed. Patient opens eyes to speech, but does not sustain eye opening. BP 70/44, HR 71, SpO2 92% on 2LNC, RR 21. Patient is oriented. Pupils are equal, round, reaction sluggish. Patient admitted today from ED to telemetry at approximately 1600. Patient has not voided since admission to telemetry unit.  Interventions: 1L IV NS bolus infusing.  NP made aware of patient's having not voided. Patient states she remembers voiding while in the ED.  Tylene Fantasia, NP at bedside to assess patient.   Plan of Care (if not transferred): Patient transferred to SD accompanied by K. Maisie Fus, RN and S. Skyy Nilan, RN.   Event Summary:  Hailey Hughes

## 2018-07-22 NOTE — ED Notes (Signed)
Date and time results received: 07/22/18 8:37 AM   (use smartphrase ".now" to insert current time)  Test: lactic acid Critical Value: 4.0  Name of Provider Notified: Aileen Fass, DO  Orders Received? Or Actions Taken?: Actions Taken: MD aware

## 2018-07-22 NOTE — ED Triage Notes (Signed)
Per EMS - Pt coming from home with CC of abd pain. Constipation x "a couple of days". Tonight around 11;15 had a very small hard stool. This evening gave herself a water enema x 2  with no relief. Abd pain started approx 10pm. N/V started approx 11;30pm. Episodes of emesis with EMS. 10/10 constant pain. No tenderness. Upon arrival of of EMS pt was cool and clammy. Pt reports hx of bowel impaction and GI problems.    76/42 laying - 56/38 sitting - 94/46 after fluids and trendelenburg  HR 88 NSR R 24 94% RA  CBG 259  20g lt forearm   800cc NS  4 zofran - with no relief from nausea

## 2018-07-22 NOTE — ED Provider Notes (Signed)
[facility]-EMERGENCY DEPT Provider Note   CSN: [id number] Arrival date & time: 07/22/18  0125    History   Chief Complaint Chief Complaint  Patient presents with  . Abdominal Pain  . Constipation  . Hypotension    HPI Hailey Hughes is a 78 y.o. female.   The history is provided by the patient.  She has history of diabetes, hypertension, hyperlipidemia and comes in because of abdominal pain.  She was doing well until this evening when she felt like she had to have a bowel movement, but was only able to pass a very small amount of hard stool.  She tried giving herself an enema without relief.  She then developed generalized abdominal pain which is severe.  She rates pain a 10/10.  There is associated nausea and vomiting.  Pain did not improve with vomiting.  She denies fever or chills.  She did break out in sweats.  EMS noted very low blood pressure.  She does relate a history of colon resection for megacolon and has had problems with stricture at the anastomosis.  Past Medical History:  Diagnosis Date  . Depression   . Diverticulosis   . DM2 (diabetes mellitus, type 2) (HCC)   . Hiatal hernia   . HTN (hypertension)   . Hyperlipidemia   . Hypothyroidism   . Imperforate anus   . Obesity   . OSA (obstructive sleep apnea)   . Palpitations   . Reflux esophagitis     Patient Active Problem List   Diagnosis Date Noted  . DYSPEPSIA 05/26/2008  . ABDOMINAL PAIN, EPIGASTRIC 05/26/2008  . HYPOTHYROIDISM 05/21/2008  . DIVERTICULOSIS, COLON 05/21/2008  . UNSPECIFIED CONSTIPATION 05/21/2008  . STENOSIS OF RECTUM AND ANUS 05/21/2008  . FIBROSITIS 05/21/2008  . 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 APPENDECTOMY    . CARPAL TUNNEL RELEASE Right   . COLON RESECTION     age 11  . COLOSTOMY     age 22 due to imperforate anus  . CYSTECTOMY     spinal  . RECTOVAGINAL  FISTULA CLOSURE     congenital     OB History   No obstetric history on file.      Home Medications    Prior to Admission medications   Medication Sig Start Date End Date Taking? Authorizing Provider  amLODipine-benazepril (LOTREL) 5-20 MG per capsule Take 1 capsule by mouth daily.    [provider]  aspirin 81 MG tablet Take 81 mg by mouth daily.    [provider]  atorvastatin (LIPITOR) 20 MG tablet Take 20 mg by mouth daily.    [provider]  bumetanide (BUMEX) 2 MG tablet Take 2 mg by mouth daily.    [provider]  celecoxib (CELEBREX) 200 MG capsule Take 200 mg by mouth daily.    [provider]  colestipol (COLESTID) 1 G tablet Take 2 tablets by mouth every morning and 1 tablet by mouth every evening. Patient not taking: Reported on 10/18/2016 02/09/14   Hart Carwin, MD  escitalopram (LEXAPRO) 20 MG tablet Take 20 mg by mouth daily.    [provider]  fexofenadine (ALLEGRA) 180 MG tablet Take 180 mg by mouth daily.    [provider]  HYDROcodone-acetaminophen (NORCO/VICODIN) 5-325 MG tablet Take 1 tablet by mouth every 6 (six) hours as needed for moderate pain.    [provider]  levothyroxine (SYNTHROID, LEVOTHROID) 112 MCG tablet Take 112 mcg by mouth daily.    [provider]  metFORMIN (GLUCOPHAGE) 500 MG tablet Take 500 mg by mouth 2 (two) times daily with a meal.    [provider]  metoprolol tartrate (LOPRESSOR) 25 MG tablet Take 25 mg by mouth 2 (two) times daily.    [provider]  montelukast (SINGULAIR) 10 MG tablet Take 10 mg by mouth as needed.    [provider]  pantoprazole (PROTONIX) 40 MG tablet Take 1 tablet (40 mg total) by mouth daily. 02/09/14   Hart CarwinBrodie, Dora M, MD  tiZANidine (ZANAFLEX) 4 MG capsule Take 4 mg by mouth 2 (two) times daily.    [provider]  traMADol (ULTRAM) 50 MG tablet Take 50 mg by mouth 2 (two) times daily. Pt  takes as needed    [provider]    Family History Family History  Problem Relation Age of Onset  . Heart attack Father   . Hypertension Father   . Hypertension Mother   . Diabetes Sister   . Colon cancer Maternal Uncle   . Heart disease Brother   . Colon polyps Sister   . Esophageal cancer Neg Hx   . Kidney disease Neg Hx     Social History Social History   Tobacco Use  . Smoking status: Never Smoker  Substance Use Topics  . Alcohol use: No  . Drug use: No     Allergies   Epinephrine   Review of Systems Review of Systems  All other systems reviewed and are negative.    Physical Exam Updated Vital Signs BP (!) 76/46 (BP Location: Right Arm)   Pulse 68   Temp (!) 97.4 F (36.3 C) (Oral)   Resp 20   Ht 5\' 2"  (1.575 m)   Wt 90.7 kg   LMP  (LMP Unknown)   SpO2 95%   BMI 36.58 kg/m   Physical Exam Vitals signs and nursing note reviewed.    78 year old female, resting comfortably and in no acute distress. Vital signs are significant for low blood pressure. Oxygen saturation is 95%, which is normal. Head is normocephalic and atraumatic. PERRLA, EOMI. Oropharynx is clear. Neck is nontender and supple without adenopathy or JVD. Back is nontender and there is no CVA tenderness. Lungs are clear without rales, wheezes, or rhonchi. Chest is nontender. Heart has regular rate and rhythm without murmur. Abdomen is soft, flat, with moderate tenderness diffusely.  There is no rebound or guarding.  There are no masses or hepatosplenomegaly and peristalsis is hypoactive. Rectal: Decreased sphincter tone.  Moderate stricture noted about 3 cm into the anal canal.  Moderately hard impaction is present but soft stool going around it.  Stool is light brown but trace guaiac positive. Extremities have no cyanosis or edema, full range of motion is present. Skin is warm and dry without rash. Neurologic: Mental status is normal, cranial nerves are intact, there are no  motor or sensory deficits.  ED Treatments / Results  Labs (all labs ordered are listed, but only abnormal results are displayed) Labs Reviewed  COMPREHENSIVE METABOLIC PANEL - Abnormal; Notable for the following components:      Result Value   CO2 18 (*)    Glucose, Bld 215 (*)    BUN 42 (*)    Creatinine, Ser 2.01 (*)    Total Protein 6.2 (*)    GFR calc non Af Amer 23 (*)  GFR calc Af Amer 26 (*)    All other components within normal limits  LIPASE, BLOOD - Abnormal; Notable for the following components:   Lipase 55 (*)    All other components within normal limits  CBC WITH DIFFERENTIAL/PLATELET - Abnormal; Notable for the following components:   WBC 17.6 (*)    HCT 46.9 (*)    Neutro Abs 15.9 (*)    All other components within normal limits  LACTIC ACID, PLASMA - Abnormal; Notable for the following components:   Lactic Acid, Venous 5.0 (*)    All other components within normal limits  URINALYSIS, ROUTINE W REFLEX MICROSCOPIC - Abnormal; Notable for the following components:   Color, Urine AMBER (*)    APPearance CLOUDY (*)    Hgb urine dipstick SMALL (*)    Ketones, ur 5 (*)    Protein, ur 100 (*)    Leukocytes,Ua SMALL (*)    WBC, UA >50 (*)    Bacteria, UA MANY (*)    All other components within normal limits  LACTIC ACID, PLASMA - Abnormal; Notable for the following components:   Lactic Acid, Venous 5.0 (*)    All other components within normal limits  POC OCCULT BLOOD, ED - Abnormal; Notable for the following components:   Fecal Occult Bld POSITIVE (*)    All other components within normal limits  LACTIC ACID, PLASMA   Radiology Ct Abdomen Pelvis Wo Contrast  Result Date: 07/22/2018 CLINICAL DATA:  Abdominal pain for several days EXAM: CT ABDOMEN AND PELVIS WITHOUT CONTRAST TECHNIQUE: Multidetector CT imaging of the abdomen and pelvis was performed following the standard protocol without IV contrast. COMPARISON:  MRI from 06/19/2008 FINDINGS: Lower chest:  Dependent atelectatic changes are noted. Hepatobiliary: Liver is well visualized and within normal limits. A mild amount of perihepatic ascites is noted. The gallbladder is well distended without significant inflammatory change. Pancreas: Unremarkable. No pancreatic ductal dilatation or surrounding inflammatory changes. Spleen: Normal in size without focal abnormality. Adrenals/Urinary Tract: Adrenal glands are within normal limits. Kidneys are well visualized bilaterally. 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 No obstructive changes are seen. The ureters and urinary bladder are within normal limits. Stomach/Bowel: Changes of reflux are noted. Small hiatal hernia is noted. Colon is distended with fluid and fecal material although no obstructive lesion is seen. Very mild pericolonic inflammatory changes are noted in the distal transverse colon. This may represent a generalized colitis. No small bowel abnormality is seen. Vascular/Lymphatic: Aortic atherosclerosis. No enlarged abdominal or pelvic lymph nodes. Reproductive: Uterus and bilateral adnexa are unremarkable. Other: Minimal free fluid is noted within the pelvis. Postsurgical changes are noted in the anterior abdominal wall consistent with the given clinical history. Musculoskeletal: Degenerative changes are noted. IMPRESSION: Mild dependent atelectatic changes. Well distended gallbladder without definitive signs of inflammatory change. Colon is mildly distended with fluid and fecal material with very mild pericolonic changes which may represent a generalized colitis. Mild ascites Mild hiatal hernia. Stable right renal angiomyolipoma. Electronically Signed   By: Alcide Clever [name]   On: 07/22/2018 07:33    Procedures Procedures  CRITICAL CARE Performed by: Dione Booze Total critical care time: 105 minutes Critical care time was exclusive of separately billable procedures and treating other patients. Critical care was necessary to treat or prevent  imminent or life-threatening deterioration. Critical care was time spent personally by me on the following activities: development of treatment plan with patient and/or surrogate as well as nursing, discussions with consultants, evaluation of patient's  response to treatment, examination of patient, obtaining history from patient or surrogate, ordering and performing treatments and interventions, ordering and review of laboratory studies, ordering and review of radiographic studies, pulse oximetry and re-evaluation of patient's condition.  Medications Ordered in ED Medications - No data to display   Initial Impression / Assessment and Plan / ED Course  I have reviewed the triage vital signs and the nursing notes.  Pertinent labs & imaging results that were available during my care of the patient were reviewed by me and considered in my medical decision making (see chart for details).  Abdominal pain with hypotension.  Multiple etiologies possible including abdominal aneurysm, perforated viscus, sepsis, ischemic bowel.  She will need to be aggressively fluid resuscitated and will be sent for CT of abdomen and pelvis.  Old records are reviewed, and she has no relevant past visits.  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  Blood pressure did come up with fluid resuscitation.  Lactic acid is come back significantly elevated.  This is felt to be due to an intra-abdominal process and hypotension, and not sepsis.  She required significant pain medication and nausea medication.  Labs did show elevated WBC, mildly elevated lipase which is not felt to be clinically significant, elevated BUN and creatinine which are new for the patient.  CT scan was switched from with contrast to noncontrast.  CT scan does show evidence of colonic distention and probable colitis.  She is started on appropriate antibiotics.  Repeat lactic acid level was unchanged.   She will need to be admitted for ongoing antibiotics and fluid resuscitation.  Final Clinical Impressions(s) / ED Diagnoses   Final diagnoses:  Colitis  Hypotension, unspecified hypotension type  Elevated lactic acid level  Acute kidney injury (nontraumatic) (HCC)  Elevated lipase    ED Discharge Orders    None       Delora Fuel, MD 48/01/65 [phone]

## 2018-07-22 NOTE — ED Notes (Signed)
Patient transported to CT 

## 2018-07-22 NOTE — ED Notes (Signed)
Roxanne Mins MD made aware of critical Lactic 5.0

## 2018-07-23 ENCOUNTER — Inpatient Hospital Stay (HOSPITAL_COMMUNITY): Payer: Medicare Other

## 2018-07-23 ENCOUNTER — Encounter (HOSPITAL_COMMUNITY): Payer: Self-pay | Admitting: Anesthesiology

## 2018-07-23 ENCOUNTER — Other Ambulatory Visit: Payer: Self-pay

## 2018-07-23 ENCOUNTER — Encounter (HOSPITAL_COMMUNITY)
Admission: EM | Disposition: A | Discharge status: Skilled Nursing Facility | Payer: Self-pay | Source: Home / Self Care | Attending: Internal Medicine

## 2018-07-23 ENCOUNTER — Inpatient Hospital Stay (HOSPITAL_COMMUNITY): Payer: Medicare Other | Admitting: Certified Registered Nurse Anesthetist

## 2018-07-23 DIAGNOSIS — K529 Noninfective gastroenteritis and colitis, unspecified: Secondary | ICD-10-CM

## 2018-07-23 DIAGNOSIS — N179 Acute kidney failure, unspecified: Secondary | ICD-10-CM

## 2018-07-23 DIAGNOSIS — J9601 Acute respiratory failure with hypoxia: Secondary | ICD-10-CM

## 2018-07-23 HISTORY — PX: LAPAROTOMY: SHX154

## 2018-07-23 LAB — BLOOD GAS, ARTERIAL
Acid-base deficit: 16.7 mmol/L — ABNORMAL HIGH (ref 0.0–2.0)
Acid-base deficit: 18.5 mmol/L — ABNORMAL HIGH (ref 0.0–2.0)
Acid-base deficit: 7 mmol/L — ABNORMAL HIGH (ref 0.0–2.0)
Acid-base deficit: 8.3 mmol/L — ABNORMAL HIGH (ref 0.0–2.0)
Acid-base deficit: 7.1 mmol/L — ABNORMAL HIGH (ref 0.0–2.0)
Bicarbonate: 13.1 mmol/L — ABNORMAL LOW (ref 20.0–28.0)
Bicarbonate: 10.4 mmol/L — ABNORMAL LOW (ref 20.0–28.0)
Bicarbonate: 19.1 mmol/L — ABNORMAL LOW (ref 20.0–28.0)
Bicarbonate: 16.4 mmol/L — ABNORMAL LOW (ref 20.0–28.0)
Bicarbonate: 17.4 mmol/L — ABNORMAL LOW (ref 20.0–28.0)
Drawn by: 232811
Drawn by: 225631
Drawn by: 331471
Drawn by: 441261
Drawn by: 317771
FIO2: 100
FIO2: 100
FIO2: 90
FIO2: 90
MECHVT: 0.4 mL
MECHVT: 400 mL
MECHVT: 400 mL
MECHVT: 400 mL
O2 Content: 15 L/min
O2 Saturation: 92.8 %
O2 Saturation: 91.2 %
O2 Saturation: 90.5 %
O2 Saturation: 91.2 %
O2 Saturation: 87.8 %
PEEP: 5 cmH2O
PEEP: 5 cmH2O
PEEP: 5 cmH2O
PEEP: 5 cmH2O
Patient temperature: 97.6
Patient temperature: 97.6
Patient temperature: 98.6
Patient temperature: 98.6
Patient temperature: 98.6
RATE: 22 resp/min
RATE: 30 resp/min
RATE: 30 resp/min
RATE: 30 resp/min
pCO2 arterial: 46.5 mmHg (ref 32.0–48.0)
pCO2 arterial: 35.5 mmHg (ref 32.0–48.0)
pCO2 arterial: 43.1 mmHg (ref 32.0–48.0)
pCO2 arterial: 32.4 mmHg (ref 32.0–48.0)
pCO2 arterial: 33.2 mmHg (ref 32.0–48.0)
pH, Arterial: 7.075 — CL (ref 7.350–7.450)
pH, Arterial: 7.091 — CL (ref 7.350–7.450)
pH, Arterial: 7.269 — ABNORMAL LOW (ref 7.350–7.450)
pH, Arterial: 7.324 — ABNORMAL LOW (ref 7.350–7.450)
pH, Arterial: 7.339 — ABNORMAL LOW (ref 7.350–7.450)
pO2, Arterial: 72 mmHg — ABNORMAL LOW (ref 83.0–108.0)
pO2, Arterial: 64.2 mmHg — ABNORMAL LOW (ref 83.0–108.0)
pO2, Arterial: 54.6 mmHg — ABNORMAL LOW (ref 83.0–108.0)
pO2, Arterial: 59.1 mmHg — ABNORMAL LOW (ref 83.0–108.0)
pO2, Arterial: 53.1 mmHg — ABNORMAL LOW (ref 83.0–108.0)

## 2018-07-23 LAB — PHOSPHORUS: Phosphorus: 5.1 mg/dL — ABNORMAL HIGH (ref 2.5–4.6)

## 2018-07-23 LAB — BASIC METABOLIC PANEL
Anion gap: 16 — ABNORMAL HIGH (ref 5–15)
Anion gap: 21 — ABNORMAL HIGH (ref 5–15)
Anion gap: 15 (ref 5–15)
BUN: 59 mg/dL — ABNORMAL HIGH (ref 8–23)
BUN: 60 mg/dL — ABNORMAL HIGH (ref 8–23)
BUN: 55 mg/dL — ABNORMAL HIGH (ref 8–23)
CO2: 14 mmol/L — ABNORMAL LOW (ref 22–32)
CO2: 15 mmol/L — ABNORMAL LOW (ref 22–32)
CO2: 16 mmol/L — ABNORMAL LOW (ref 22–32)
Calcium: 6.6 mg/dL — ABNORMAL LOW (ref 8.9–10.3)
Calcium: 6.4 mg/dL — CL (ref 8.9–10.3)
Calcium: 5.9 mg/dL — CL (ref 8.9–10.3)
Chloride: 113 mmol/L — ABNORMAL HIGH (ref 98–111)
Chloride: 110 mmol/L (ref 98–111)
Chloride: 110 mmol/L (ref 98–111)
Creatinine, Ser: 3.04 mg/dL — ABNORMAL HIGH (ref 0.44–1.00)
Creatinine, Ser: 2.79 mg/dL — ABNORMAL HIGH (ref 0.44–1.00)
Creatinine, Ser: 2.74 mg/dL — ABNORMAL HIGH (ref 0.44–1.00)
GFR calc Af Amer: 16 mL/min — ABNORMAL LOW (ref >60)
GFR calc Af Amer: 18 mL/min — ABNORMAL LOW (ref >60)
GFR calc Af Amer: 18 mL/min — ABNORMAL LOW (ref >60)
GFR calc non Af Amer: 14 mL/min — ABNORMAL LOW (ref >60)
GFR calc non Af Amer: 15 mL/min — ABNORMAL LOW (ref >60)
GFR calc non Af Amer: 16 mL/min — ABNORMAL LOW (ref >60)
Glucose, Bld: 136 mg/dL — ABNORMAL HIGH (ref 70–99)
Glucose, Bld: 120 mg/dL — ABNORMAL HIGH (ref 70–99)
Glucose, Bld: 161 mg/dL — ABNORMAL HIGH (ref 70–99)
Potassium: 5.6 mmol/L — ABNORMAL HIGH (ref 3.5–5.1)
Potassium: 6.6 mmol/L (ref 3.5–5.1)
Potassium: 4 mmol/L (ref 3.5–5.1)
Sodium: 143 mmol/L (ref 135–145)
Sodium: 146 mmol/L — ABNORMAL HIGH (ref 135–145)
Sodium: 141 mmol/L (ref 135–145)

## 2018-07-23 LAB — POCT I-STAT 7, (LYTES, BLD GAS, ICA,H+H)
Acid-base deficit: 12 mmol/L — ABNORMAL HIGH (ref 0.0–2.0)
Bicarbonate: 15.3 mmol/L — ABNORMAL LOW (ref 20.0–28.0)
Calcium, Ion: 0.95 mmol/L — ABNORMAL LOW (ref 1.15–1.40)
HCT: 32 % — ABNORMAL LOW (ref 36.0–46.0)
Hemoglobin: 10.9 g/dL — ABNORMAL LOW (ref 12.0–15.0)
O2 Saturation: 89 %
Potassium: 5.4 mmol/L — ABNORMAL HIGH (ref 3.5–5.1)
Sodium: 142 mmol/L (ref 135–145)
TCO2: 17 mmol/L — ABNORMAL LOW (ref 22–32)
pCO2 arterial: 41.4 mmHg (ref 32.0–48.0)
pH, Arterial: 7.176 — CL (ref 7.350–7.450)
pO2, Arterial: 72 mmHg — ABNORMAL LOW (ref 83.0–108.0)

## 2018-07-23 LAB — GLUCOSE, CAPILLARY
Glucose-Capillary: 108 mg/dL — ABNORMAL HIGH (ref 70–99)
Glucose-Capillary: 123 mg/dL — ABNORMAL HIGH (ref 70–99)
Glucose-Capillary: 152 mg/dL — ABNORMAL HIGH (ref 70–99)
Glucose-Capillary: 158 mg/dL — ABNORMAL HIGH (ref 70–99)

## 2018-07-23 LAB — LACTIC ACID, PLASMA
Lactic Acid, Venous: 3.5 mmol/L (ref 0.5–1.9)
Lactic Acid, Venous: 4.9 mmol/L (ref 0.5–1.9)
Lactic Acid, Venous: 5.6 mmol/L (ref 0.5–1.9)

## 2018-07-23 LAB — LIPASE, BLOOD: Lipase: 22 U/L (ref 11–51)

## 2018-07-23 SURGERY — LAPAROTOMY, EXPLORATORY
Anesthesia: General | Site: Abdomen

## 2018-07-23 MED ORDER — LACTATED RINGERS IV SOLN
INTRAVENOUS | Status: DC | PRN
  Administered 2018-07-23 (×3): via INTRAVENOUS

## 2018-07-23 MED ORDER — CALCIUM CHLORIDE 10 % IV SOLN
1.0000 g | Freq: Once | INTRAVENOUS | Status: DC
  Filled 2018-07-23: qty 10

## 2018-07-23 MED ORDER — FENTANYL CITRATE (PF) 100 MCG/2ML IJ SOLN
INTRAMUSCULAR | Status: AC
  Filled 2018-07-23: qty 2

## 2018-07-23 MED ORDER — INSULIN ASPART 100 UNIT/ML ~~LOC~~ SOLN
0.0000 [IU] | SUBCUTANEOUS | Status: DC
  Administered 2018-07-23 (×2): 3 [IU] via SUBCUTANEOUS
  Administered 2018-07-24 (×2): 2 [IU] via SUBCUTANEOUS
  Administered 2018-07-24: 3 [IU] via SUBCUTANEOUS
  Administered 2018-07-24 (×2): 2 [IU] via SUBCUTANEOUS
  Administered 2018-07-25: 3 [IU] via SUBCUTANEOUS
  Administered 2018-07-25: 2 [IU] via SUBCUTANEOUS
  Administered 2018-07-25: 3 [IU] via SUBCUTANEOUS
  Administered 2018-07-25: 2 [IU] via SUBCUTANEOUS
  Administered 2018-07-25 – 2018-07-28 (×14): 3 [IU] via SUBCUTANEOUS
  Administered 2018-07-28 (×4): 2 [IU] via SUBCUTANEOUS
  Administered 2018-07-28 – 2018-07-29 (×2): 3 [IU] via SUBCUTANEOUS
  Administered 2018-07-29: 2 [IU] via SUBCUTANEOUS
  Administered 2018-07-30 (×2): 3 [IU] via SUBCUTANEOUS
  Administered 2018-07-30: 5 [IU] via SUBCUTANEOUS
  Administered 2018-07-30: 8 [IU] via SUBCUTANEOUS
  Administered 2018-07-30: 2 [IU] via SUBCUTANEOUS
  Administered 2018-07-30: 5 [IU] via SUBCUTANEOUS
  Administered 2018-07-31 – 2018-08-01 (×10): 2 [IU] via SUBCUTANEOUS
  Administered 2018-08-02 (×4): 3 [IU] via SUBCUTANEOUS
  Administered 2018-08-02: 2 [IU] via SUBCUTANEOUS
  Administered 2018-08-02: 3 [IU] via SUBCUTANEOUS
  Administered 2018-08-03 – 2018-08-04 (×7): 2 [IU] via SUBCUTANEOUS

## 2018-07-23 MED ORDER — MIDAZOLAM HCL 2 MG/2ML IJ SOLN
INTRAMUSCULAR | Status: AC
  Filled 2018-07-23: qty 2

## 2018-07-23 MED ORDER — FENTANYL 2500MCG IN NS 250ML (10MCG/ML) PREMIX INFUSION: 0.0000 ug/h | INTRAVENOUS | Status: DC

## 2018-07-23 MED ORDER — SODIUM CHLORIDE 0.9 % IV SOLN
0.0000 ug/h | INTRAVENOUS | Status: DC
  Administered 2018-07-23: 25 ug/h via INTRAVENOUS
  Administered 2018-07-24 (×2): 150 ug/h via INTRAVENOUS
  Filled 2018-07-23 (×4): qty 50

## 2018-07-23 MED ORDER — ROCURONIUM BROMIDE 10 MG/ML (PF) SYRINGE
PREFILLED_SYRINGE | INTRAVENOUS | Status: AC
  Filled 2018-07-23: qty 10

## 2018-07-23 MED ORDER — CHLORHEXIDINE GLUCONATE 0.12% ORAL RINSE (MEDLINE KIT)
15.0000 mL | Freq: Two times a day (BID) | OROMUCOSAL | Status: DC
  Administered 2018-07-23 – 2018-07-28 (×11): 15 mL via OROMUCOSAL

## 2018-07-23 MED ORDER — VASOPRESSIN 20 UNIT/ML IV SOLN
0.0300 [IU]/min | INTRAVENOUS | Status: DC
  Administered 2018-07-23 – 2018-07-24 (×2): 0.03 [IU]/min via INTRAVENOUS
  Filled 2018-07-23 (×4): qty 2

## 2018-07-23 MED ORDER — INSULIN ASPART 100 UNIT/ML IV SOLN
10.0000 [IU] | INTRAVENOUS | Status: AC
  Administered 2018-07-23: 10 [IU] via INTRAVENOUS
  Filled 2018-07-23: qty 0.1

## 2018-07-23 MED ORDER — INSULIN ASPART 100 UNIT/ML IV SOLN: 10.0000 [IU] | Freq: Once | INTRAVENOUS | Status: DC

## 2018-07-23 MED ORDER — MIDAZOLAM HCL 5 MG/5ML IJ SOLN
INTRAMUSCULAR | Status: DC | PRN
  Administered 2018-07-23: .25 mg via INTRAVENOUS

## 2018-07-23 MED ORDER — CALCIUM GLUCONATE-NACL 1-0.675 GM/50ML-% IV SOLN
1.0000 g | Freq: Once | INTRAVENOUS | Status: AC
  Administered 2018-07-23: 1000 mg via INTRAVENOUS
  Filled 2018-07-23: qty 50

## 2018-07-23 MED ORDER — FENTANYL CITRATE (PF) 100 MCG/2ML IJ SOLN
INTRAMUSCULAR | Status: DC | PRN
  Administered 2018-07-23: 50 ug via INTRAVENOUS

## 2018-07-23 MED ORDER — FENTANYL CITRATE (PF) 100 MCG/2ML IJ SOLN
INTRAMUSCULAR | Status: AC
  Filled 2018-07-23: qty 4

## 2018-07-23 MED ORDER — FAMOTIDINE IN NACL 20-0.9 MG/50ML-% IV SOLN: 20.0000 mg | Freq: Two times a day (BID) | INTRAVENOUS | Status: DC

## 2018-07-23 MED ORDER — SUCCINYLCHOLINE CHLORIDE 200 MG/10ML IV SOSY
PREFILLED_SYRINGE | INTRAVENOUS | Status: AC
  Filled 2018-07-23: qty 10

## 2018-07-23 MED ORDER — SODIUM CHLORIDE 0.9% FLUSH: 10.0000 mL | INTRAVENOUS | Status: DC | PRN

## 2018-07-23 MED ORDER — MIDAZOLAM HCL 2 MG/2ML IJ SOLN
2.0000 mg | Freq: Once | INTRAMUSCULAR | Status: AC
  Administered 2018-07-23: 2 mg via INTRAVENOUS

## 2018-07-23 MED ORDER — ALBUMIN HUMAN 5 % IV SOLN
INTRAVENOUS | Status: DC | PRN
  Administered 2018-07-23: 14:00:00 via INTRAVENOUS

## 2018-07-23 MED ORDER — ETOMIDATE 2 MG/ML IV SOLN
20.0000 mg | Freq: Once | INTRAVENOUS | Status: AC
  Administered 2018-07-23: 20 mg via INTRAVENOUS

## 2018-07-23 MED ORDER — ALBUMIN HUMAN 5 % IV SOLN
INTRAVENOUS | Status: AC
  Filled 2018-07-23: qty 250

## 2018-07-23 MED ORDER — PHENYLEPHRINE 40 MCG/ML (10ML) SYRINGE FOR IV PUSH (FOR BLOOD PRESSURE SUPPORT)
PREFILLED_SYRINGE | INTRAVENOUS | Status: DC | PRN
  Administered 2018-07-23 (×2): 120 ug via INTRAVENOUS

## 2018-07-23 MED ORDER — ROCURONIUM BROMIDE 100 MG/10ML IV SOLN
INTRAVENOUS | Status: DC | PRN
  Administered 2018-07-23: 50 mg via INTRAVENOUS
  Administered 2018-07-23: 100 mg via INTRAVENOUS

## 2018-07-23 MED ORDER — PHENYLEPHRINE 40 MCG/ML (10ML) SYRINGE FOR IV PUSH (FOR BLOOD PRESSURE SUPPORT)
PREFILLED_SYRINGE | INTRAVENOUS | Status: AC
  Filled 2018-07-23: qty 10

## 2018-07-23 MED ORDER — PHENYLEPHRINE HCL-NACL 40-0.9 MG/250ML-% IV SOLN
0.0000 ug/min | INTRAVENOUS | Status: DC
  Administered 2018-07-23: 250 ug/min via INTRAVENOUS
  Administered 2018-07-23: 05:00:00 220 ug/min via INTRAVENOUS
  Filled 2018-07-23 (×5): qty 250

## 2018-07-23 MED ORDER — SODIUM BICARBONATE 8.4 % IV SOLN
200.0000 meq | Freq: Once | INTRAVENOUS | Status: AC
  Administered 2018-07-23: 200 meq via INTRAVENOUS
  Filled 2018-07-23: qty 200

## 2018-07-23 MED ORDER — NOREPINEPHRINE BITARTRATE 1 MG/ML IV SOLN
0.0000 ug/min | INTRAVENOUS | Status: DC
  Administered 2018-07-23: 22:00:00 21 ug/min via INTRAVENOUS
  Administered 2018-07-23: 20 ug/min via INTRAVENOUS
  Administered 2018-07-24: 25 ug/min via INTRAVENOUS
  Filled 2018-07-23 (×7): qty 4

## 2018-07-23 MED ORDER — SODIUM CHLORIDE 0.9 % IV SOLN
500.0000 mL | Freq: Once | INTRAVENOUS | Status: AC
  Administered 2018-07-23: 500 mL via INTRAVENOUS

## 2018-07-23 MED ORDER — NOREPINEPHRINE BITARTRATE 1 MG/ML IV SOLN
INTRAVENOUS | Status: DC | PRN
  Administered 2018-07-23: 5 ug/min via INTRAVENOUS

## 2018-07-23 MED ORDER — LEVOTHYROXINE SODIUM 100 MCG/5ML IV SOLN
56.0000 ug | Freq: Every day | INTRAVENOUS | Status: AC
  Administered 2018-07-23 – 2018-08-04 (×13): 56 ug via INTRAVENOUS
  Filled 2018-07-23 (×13): qty 5

## 2018-07-23 MED ORDER — ORAL CARE MOUTH RINSE
15.0000 mL | OROMUCOSAL | Status: DC
  Administered 2018-07-23 – 2018-07-28 (×51): 15 mL via OROMUCOSAL

## 2018-07-23 MED ORDER — 0.9 % SODIUM CHLORIDE (POUR BTL) OPTIME
TOPICAL | Status: DC | PRN
  Administered 2018-07-23: 6000 mL

## 2018-07-23 MED ORDER — SODIUM CHLORIDE 0.9 % IV BOLUS
1000.0000 mL | Freq: Once | INTRAVENOUS | Status: AC
  Administered 2018-07-23: 1000 mL via INTRAVENOUS

## 2018-07-23 MED ORDER — PROPOFOL 10 MG/ML IV BOLUS
INTRAVENOUS | Status: AC
  Filled 2018-07-23: qty 20

## 2018-07-23 MED ORDER — LIDOCAINE 2% (20 MG/ML) 5 ML SYRINGE
INTRAMUSCULAR | Status: AC
  Filled 2018-07-23: qty 5

## 2018-07-23 MED ORDER — DEXTROSE 50 % IV SOLN
1.0000 | Freq: Once | INTRAVENOUS | Status: AC
  Administered 2018-07-23: 18:00:00 50 mL via INTRAVENOUS
  Filled 2018-07-23: qty 50

## 2018-07-23 MED ORDER — STERILE WATER FOR INJECTION IV SOLN
INTRAVENOUS | Status: AC
  Administered 2018-07-23 – 2018-07-24 (×5): via INTRAVENOUS
  Filled 2018-07-23 (×5): qty 850

## 2018-07-23 MED ORDER — SODIUM CHLORIDE 0.9% FLUSH
10.0000 mL | Freq: Two times a day (BID) | INTRAVENOUS | Status: DC
  Administered 2018-07-23 – 2018-07-28 (×11): 10 mL
  Administered 2018-07-29: 30 mL
  Administered 2018-07-29: 10 mL

## 2018-07-23 SURGICAL SUPPLY — 63 items
APPLIER CLIP 13 LRG OPEN (CLIP) ×3
APR CLP LRG 13 20 CLIP (CLIP) ×1
BARRIER SKIN 2 1/4 (WOUND CARE) ×2 IMPLANT
BARRIER SKIN 2 1/4INCH (WOUND CARE) ×1
BARRIER SKIN OD1.75 2 1/4 FLNG (WOUND CARE) IMPLANT
BLADE EXTENDED COATED 6.5IN (ELECTRODE) ×2 IMPLANT
BLADE HEX COATED 2.75 (ELECTRODE) ×3 IMPLANT
BNDG GAUZE ELAST 4 BULKY (GAUZE/BANDAGES/DRESSINGS) ×2 IMPLANT
BRR SKN FLT 1.75X2.25 2 PC (WOUND CARE) ×1
CLIP APPLIE 13 LRG OPEN (CLIP) IMPLANT
COVER MAYO STAND STRL (DRAPES) ×4 IMPLANT
COVER SURGICAL LIGHT HANDLE (MISCELLANEOUS) ×3 IMPLANT
COVER WAND RF STERILE (DRAPES) ×2 IMPLANT
DRAIN CHANNEL 19F RND (DRAIN) ×2 IMPLANT
DRAPE INCISE IOBAN 66X45 STRL (DRAPES) ×2 IMPLANT
DRAPE LAPAROSCOPIC ABDOMINAL (DRAPES) ×3 IMPLANT
DRAPE WARM FLUID 44X44 (DRAPES) ×2 IMPLANT
ELECT REM PT RETURN 15FT ADLT (MISCELLANEOUS) ×3 IMPLANT
EVACUATOR SILICONE 100CC (DRAIN) ×2 IMPLANT
GAUZE SPONGE 4X4 12PLY STRL (GAUZE/BANDAGES/DRESSINGS) ×3 IMPLANT
GLOVE BIO SURGEON STRL SZ 6.5 (GLOVE) ×2 IMPLANT
GLOVE BIO SURGEONS STRL SZ 6.5 (GLOVE) ×2
GLOVE BIOGEL PI IND STRL 6.5 (GLOVE) IMPLANT
GLOVE BIOGEL PI IND STRL 7.0 (GLOVE) ×1 IMPLANT
GLOVE BIOGEL PI INDICATOR 6.5 (GLOVE) ×2
GLOVE BIOGEL PI INDICATOR 7.0 (GLOVE) ×4
GLOVE ECLIPSE 6.5 STRL STRAW (GLOVE) ×2 IMPLANT
GLOVE EUDERMIC 7 POWDERFREE (GLOVE) ×3 IMPLANT
GLOVE SURG SS PI 7.0 STRL IVOR (GLOVE) ×4 IMPLANT
GOWN STRL REUS W/TWL LRG LVL3 (GOWN DISPOSABLE) ×5 IMPLANT
GOWN STRL REUS W/TWL XL LVL3 (GOWN DISPOSABLE) ×6 IMPLANT
HANDLE SUCTION POOLE (INSTRUMENTS) IMPLANT
KIT BASIN OR (CUSTOM PROCEDURE TRAY) ×3 IMPLANT
KIT TURNOVER KIT A (KITS) ×2 IMPLANT
LIGASURE IMPACT 36 18CM CVD LR (INSTRUMENTS) ×2 IMPLANT
PACK GENERAL/GYN (CUSTOM PROCEDURE TRAY) ×3 IMPLANT
POUCH OSTOMY 2 PC DRNBL 2.25 (WOUND CARE) IMPLANT
POUCH OSTOMY DRNBL 2 1/4 (WOUND CARE) ×3
RELOAD PROXIMATE 75MM BLUE (ENDOMECHANICALS) ×6 IMPLANT
RELOAD STAPLE 75 3.8 BLU REG (ENDOMECHANICALS) IMPLANT
SPONGE DRAIN TRACH 4X4 STRL 2S (GAUZE/BANDAGES/DRESSINGS) ×2 IMPLANT
SPONGE LAP 18X18 RF (DISPOSABLE) IMPLANT
STAPLER PROXIMATE 75MM BLUE (STAPLE) ×2 IMPLANT
STAPLER VISISTAT 35W (STAPLE) ×5 IMPLANT
SUCTION POOLE HANDLE (INSTRUMENTS) ×3
SUT ETHILON 3 0 PS 1 (SUTURE) ×2 IMPLANT
SUT NOVA 1 T20/GS 25DT (SUTURE) ×2 IMPLANT
SUT PDS AB 1 CTX 36 (SUTURE) IMPLANT
SUT PDS AB 1 TP1 96 (SUTURE) ×6 IMPLANT
SUT SILK 2 0 (SUTURE) ×6
SUT SILK 2 0 SH CR/8 (SUTURE) ×2 IMPLANT
SUT SILK 2 0SH CR/8 30 (SUTURE) ×2 IMPLANT
SUT SILK 2-0 18XBRD TIE 12 (SUTURE) IMPLANT
SUT SILK 2-0 30XBRD TIE 12 (SUTURE) IMPLANT
SUT SILK 3 0 (SUTURE) ×3
SUT SILK 3 0 SH CR/8 (SUTURE) ×2 IMPLANT
SUT SILK 3-0 18XBRD TIE 12 (SUTURE) IMPLANT
SUT VIC AB 3-0 SH 18 (SUTURE) ×2 IMPLANT
SUT VICRYL 2 0 18  UND BR (SUTURE)
SUT VICRYL 2 0 18 UND BR (SUTURE) IMPLANT
TOWEL OR 17X26 10 PK STRL BLUE (TOWEL DISPOSABLE) ×6 IMPLANT
TRAY FOLEY MTR SLVR 14FR STAT (SET/KITS/TRAYS/PACK) ×2 IMPLANT
YANKAUER SUCT BULB TIP NO VENT (SUCTIONS) IMPLANT

## 2018-07-23 NOTE — Progress Notes (Signed)
Patient has clinically deteriorated through the night.  Oxygen sats dropping along with mixed met and resp acidosis prompted intubation.  Patient did begin vomiting prior to any intubation attempt.  Otherwise easy intubation without complications.  CXR pending.  Called multiple times in an attempt to reach her husband but not response on multiple numbers.

## 2018-07-23 NOTE — Progress Notes (Addendum)
NAME:  Hailey Hughes, MRN:  [id number], DOB:  Dec 21, 1940, LOS: 1 ADMISSION DATE:  07/22/2018, CONSULTATION DATE: 07/22/2018 REFERRING MD: Raphael Gibney MD, CHIEF COMPLAINT: Colitis  Brief History   Patient is an 78 year old white female admitted the hospital 4/9 with CT scan evidence of colitis transferred to the ICU that evening due to worsening hypotension  Past Medical History  Prior multiple abdominal surgeries, hypothyroidism, type 2 diabetes rheumatoid arthritis, obstructive sleep apnea  Significant Hospital Events   Admission 07/22/2018 transferred to ICU for hypotension 6/10: Received multiple boluses of IV crystalloid remained hypotensive requiring phenylephrine infusion.  Started on vasopressin on top of phenylephrine, central line placed.  Patient did have episode of vomiting.  Developed worsening work of breathing, desaturation, and worsening metabolic acidosis Ultimately required intubation.  Lactic acid initially slightly improved from peak of 5.4 down to 3.5. having exquisite abdominal pain, surgical services consulted  Consults:  Critical care 6/9 General surgery consulted 6/10 Procedures:  Intubation 6/10 CVL left subclavian 6/10 Arterial line 6/10 Significant Diagnostic Tests:  NA  Micro Data:  MRSA screen 6/9: neg BCX2 6/9>>> COVID-19 6/9: neg  Antimicrobials:  Zosyn 6/9>>>  Interim history/subjective:  Intubated early this am  Objective   Blood pressure 121/60, pulse 77, temperature (Abnormal) 97.1 F (36.2 C), temperature source Axillary, resp. rate 17, height 5\' 2"  (1.575 m), weight 90.7 kg, SpO2 95 %.    Vent Mode: PRVC FiO2 (%):  [100 %] 100 % Set Rate:  [22 bmp-30 bmp] 30 bmp Vt Set:  [400 mL] 400 mL PEEP:  [5 cmH20] 5 cmH20 Plateau Pressure:  [23 cmH20-25 cmH20] 23 cmH20   Intake/Output Summary (Last 24 hours) at 07/23/2018 0933 Last data filed at 07/23/2018 09/22/2018 Gross per 24 hour  Intake 3958.62 ml  Output 0 ml  Net 3958.62 ml   Filed Weights    07/22/18 0148  Weight: 90.7 kg    Examination:  General: This is a 78 year old female who appears quite uncomfortable on mechanical ventilator with marked abdominal discomfort HEENT normocephalic atraumatic no jugular venous distention orally intubated mucous membranes moist nasogastric tube in place Pulmonary: Clear to auscultation diminished bases no accessory use Cardiac: Regular rate and rhythm Abdomen: Remarkably and exquisitely tender throughout abdomen.  Seems worse on the right upper and lower quadrant however also quite generalized guarding noted GU: Minimal urine output Neuro: Awake anxious follows commands  Resolved Hospital Problem list   NA  Assessment & Plan:   Septic shock, presumed abdominal translocation from ischemic versus infectious colitis also consider urinary tract source  Plan  Day #2 Zosyn Maintain mean arterial pressure greater than 65 CVP goal 8-12 Follow-up culture data Continuing IV fluids Holding antihypertensives and diuresis I have asked general surgery to see  Anion gap and non-anion gap metabolic acidosis in the setting of lactic acidosis as well as AKI Plan Continuing supportive care Holding metformin  Acute hypoxic respiratory failure.  Presumed secondary to worsening shock, possible aspiration event and superimposed on atelectasis from decreased abdominal compliance Portable chest x-ray personally reviewed: This demonstrates endotracheal tube in satisfactory position, left subclavian line in satisfactory position.  Elevated bilateral hemidiaphragms with left basilar atelectasis Plan Full ventilator support PAD protocol RAS goal -2 VAP bundle  Acute kidney injury: Baseline creatinine approximately 1.3 current creatinine 2.66.  Suspect secondary to shock state Plan Renal dose medications Ensure adequate mean arterial pressure greater than 65 CVP goal 8-12 Strict intake output A.m. chemistry Holding antihypertensives Holding NSAIDs  Holding diuresis  Type 2 diabetes mellitus Excellent glycemic control  plan Sliding scale insulin  Elevated lipase, pancreas unremarkable on CT imaging Plan Repeat a.m. lipase    Best practice:  Diet: h20 Pain/Anxiety/Delirium protocol (if indicated): na VAP protocol (if indicated): na DVT prophylaxis: scd GI prophylaxis: protonix Glucose control: sliding scale Mobility: bed Code Status: full Family Communication: discussed with patient Disposition: She is critically ill and worried about abdominal translocation in the setting of possible ischemic gut.  Of asked general surgery to see here in consultation emergently.  They are evaluating her for exploration   Critical care time 34 min      Erick Colace ACNP-BC [place] Pager # [phone] OR # [phone] if no answer   Attending portion:  Intubated and started on pressors overnight.  C/o worsening abdominal pain this morning.  BP 121/60   Pulse 77   Temp (!) 97.1 F (36.2 C) (Axillary)   Resp 17   Ht 5\' 2"  (1.575 m)   Wt 90.7 kg   LMP  (LMP Unknown)   SpO2 95%   BMI 36.58 kg/m   RASS 0.  HR regular.  Decreased BS, no wheeze.  Abdomen distended, diffusely tender.  Changes of RA in hands.    CXR (reviewed by me) - basilar ATX  A/p  Peritonitis with concern for ischemic bowel. Hx of diverticulosis, hirschsprung's disease, anal stenosis. - appreciate help from surgery >> plan for exploration of abdomen  Septic shock from abdomen source. - pressors to keep MAP > 65 - continue ABx  Acute hypoxic respiratory failure. Hx of OSA. - continue full vent support - f/u CXR  AKI from ATN >> baseline creatinine 1.7 from 12/30/17. Metabolic acidosis. - f/u BMET, ABG - continue HCO3 in IV fluid - defer nephrology assessment for now  Hx of DM. - SSI  Hx of hypothyroidism. - IV synthroid  Hx of rheumatoid arthritis. - prn pain medications  D/w Dr. Dalbert Batman  CC time by me  independent of APP time 34 minutes  Chesley Mires, MD [place] 07/23/2018, 11:12 AM

## 2018-07-23 NOTE — Progress Notes (Signed)
Hyperkalemia and lactic acidosis.  Will give calcium, insulin/dextrose, IV fluid bolus.  F/u BMET and ABG later tonight.  Chesley Mires, MD [facility] Pulmonary/Critical Care 07/23/2018, 5:31 PM

## 2018-07-23 NOTE — Op Note (Addendum)
Patient Name:           Hailey Hughes   Date of Surgery:        07/23/2018  Pre op Diagnosis:      Peritonitis, septic shock, suspect colitis  Post op Diagnosis:    Ischemic colitis, acute cholecystitis  Procedure:                 Exploratory laparotomy, total abdominal colectomy, [name], cholecystectomy  Surgeon:                     Edsel Petrin. 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  Her condition rapidly deteriorated with septic shock, lactic acidosis, renal failure, respiratory failure requiring ventilator care, pressor support.  Her CT scan, although not that impressive, suggested some early thickening of the colon wall but no perforation or abscess.  On exam, however, her abdomen was diffusely tender with diffuse guarding and rebound.  She was brought to the operating room emergently for laparotomy  Operative Findings:       The patient appeared to have ischemic colitis.  It was not completely infarcted although areas of the mid and left transverse colon were blackish green.  There was exudate on most of the colon.  There was a little bit of E. coli odor.  The small bowel was examined from ligament of Treitz all the way to the ileocecal valve and looked normal and healthy.  The gallbladder was edematous and there were greenish patches on it and I felt that it might be ischemic as well.  The liver looked healthy.  The spleen was normal size and looked healthy.  The spleen looked healthy.. We examined the stomach from the EG junction past the pylorus.    The colon was not  perforated but was necrotic, so I would classify  this as a class IV wound due to the presence of dead and dying tissue.  .  When we divided the gastrocolic omentum we examined the posterior wall of the stomach and that was also soft and normal.  We partially kocherized the duodenum and there was no disease process there.  The ileostomy was pink and healthy at the end of the case.  We were able to close the fascia without any difficulty.  We packed the wound open  Procedure in Detail:          The patient was brought to the operating room intubated with an arterial line and onto pressors to control blood pressure.  General anesthesia was induced.  The abdomen was prepped and draped in a sterile fashion.  The patient was on therapeutic antibiotics.  Surgical timeout was performed.       Midline laparotomy incision was made.  First a short incision and that had to be extended.  The Bookwalter tract retractor was placed after taking down adhesions.  After thoroughly exploring all 4 quadrants of the abdomen we were fairly comfortable with the diagnosis.  We mobilized the distal ileum by dividing a  pelvic adhesions.  We transected the distal ileum with a GIA stapling device.  We mobilized the right colon transverse colon splenic flexure and descending colon and sigmoid colon by dividing the lateral peritoneal attachments.  We divided the colon mesentery with the LigaSure device and were careful to stay close to the colon to avoid injury to any retroperitoneal structures.  We transected the proximal rectum with a GIA stapling device and passed the specimen off the field.  Hemostasis was good at this point.  Some cautery and some 2-0 silk sutures had been required.  We then elevated the gallbladder and dissected it from its bed from fundus downward.  When we got down to the neck of the gallbladder we carefully isolated the cystic artery and cystic duct separately controlled them with metal clips and divided them.  The  gallbladder was passed off the field.  The gallbladder bed was cauterized.  We thoroughly irrigated all 4 quadrants of the abdomen.  We inspected all 4 quadrants.  At the end of the case there did not appear to be any bleeding.  We placed a 56 [place] Blake drain into the pelvis and brought out through the left lower quadrant and connected to a suction bulb after suturing to the skin.      The ileostomy was brought out in the right mid abdomen slightly above the umbilicus on no tension.  We were able to mature this with a [name] technique and place a bag on this.  The midline fascia was closed with a running suture of #1 PDS and several interrupted sutures of #1 Novafil.  The wound was packed open with Kerlix.  The patient tolerated the procedure well and was taken back to the intensive care unit in critical condition.      EBL 150 cc.  Counts correct.  Complications none.  I discussed the patient's condition and care and operative event with her son, Hailey Hughes in [place], phone #[phone]     Angelia Mould. Derrell Lolling, [name], FACS General and Minimally Invasive Surgery Breast and Colorectal Surgery  07/23/2018 1:55 PM

## 2018-07-23 NOTE — Progress Notes (Signed)
PT in OR at this time- transported on 100% Fi02.

## 2018-07-23 NOTE — Progress Notes (Signed)
eLink Physician-Brief Progress Note Patient Name: Hailey Hughes DOB: 1940/08/05 MRN: [id number]   Date of Service  07/23/2018  HPI/Events of Note  Hyperkalemia - K+ = 6.6 and Hypocalcemia - Ca++ =  6.4. Pharmacy asks if OK to give Calcium gluconate instead of Calcium Chloride. EkG on bedside monitor without peaked T waves or widened QRS.   eICU Interventions  Will order: 1. Change to Calcium gluconate 1 gm IV now. 2. Phosphorus level now.  3. If Ca++ x PO4--- double product less than 50, OK to give second gm of Calcium gluconate.     Intervention Category Major Interventions: Electrolyte abnormality - evaluation and management  Wilferd Ritson Eugene 07/23/2018, 7:19 PM

## 2018-07-23 NOTE — Procedures (Signed)
Central Venous Catheter Insertion Procedure Note Hailey Hughes [id number] [birth year]  Procedure: Insertion of Central Venous Catheter Indications: Drug and/or fluid administration  Procedure Details Consent: Risks of procedure as well as the alternatives and risks of each were explained to the (patient/caregiver).  Consent for procedure obtained. Patient signed consent but did not have her glasses. Time Out: Verified patient identification, verified procedure, site/side was marked, verified correct patient position, special equipment/implants available, medications/allergies/relevent history reviewed, required imaging and test results available.  Performed  Maximum sterile technique was used including antiseptics, cap, gloves, gown, hand hygiene, mask and sheet. Skin prep: Chlorhexidine; local anesthetic administered A antimicrobial bonded/coated triple lumen catheter was placed in the left subclavian vein using the Seldinger technique.  Evaluation Blood flow good Complications: No apparent complications Patient did tolerate procedure well. Chest X-ray ordered to verify placement.  CXR: pending.  [name] 07/23/2018, 3:16 AM

## 2018-07-23 NOTE — Progress Notes (Signed)
CRITICAL VALUE ALERT  Critical Value:  Lactic acid 5.6, K 6.6, Calcium 6.4  Date & Time Notied: 07/23/18 1700  Provider Notified: Halford Chessman MD  Orders Received/Actions taken: Awaiting further orders

## 2018-07-23 NOTE — Progress Notes (Signed)
NAME:  Hailey Hughes, MRN:  [id number], DOB:  Apr 17, 1940, LOS: 1 ADMISSION DATE:  07/22/2018, CONSULTATION DATE: 07/22/2018 REFERRING MD: Raphael Gibney MD, CHIEF COMPLAINT: Colitis  Brief History   Patient is an 78 year old white female admitted the hospital 4/9 with CT scan evidence of colitis transferred to the ICU that evening due to worsening hypotension  Past Medical History  Prior multiple abdominal surgeries, hypothyroidism, type 2 diabetes rheumatoid arthritis, obstructive sleep apnea  Significant Hospital Events   Admission 07/22/2018 transferred to ICU for hypotension 6/10: Received multiple boluses of IV crystalloid remained hypotensive requiring phenylephrine infusion.  Started on vasopressin on top of phenylephrine, central line placed.  Patient did have episode of vomiting.  Developed worsening work of breathing, desaturation, and worsening metabolic acidosis Ultimately required intubation.  Lactic acid initially slightly improved from peak of 5.4 down to 3.5. having exquisite abdominal pain, surgical services consulted->went for emergent exploratory lap, found to have ischemic colitis and possibly ischemic GB she underwent total colectomy w/ ileostomy placement as well as cholecystectomy. She returned to the ICU w/ three vasoactive gtts and bicarb gtt doubled. PCCM asked to resume care.   Consults:  Critical care 6/9 General surgery consulted 6/10 Procedures:  Intubation 6/10 CVL left subclavian 6/10 Arterial line 6/10 Significant Diagnostic Tests:  NA  Micro Data:  MRSA screen 6/9: neg BCX2 6/9>>> COVID-19 6/9: neg  Antimicrobials:  Zosyn 6/9>>>  Interim history/subjective:  Now returning from the OR Objective   Blood pressure 121/60, pulse 77, temperature (Abnormal) 97.1 F (36.2 C), temperature source Axillary, resp. rate 17, height 5\' 2"  (1.575 m), weight 90.7 kg, SpO2 95 %. CVP:  [7 mmHg] 7 mmHg  Vent Mode: PRVC FiO2 (%):  [100 %] 100 % Set Rate:  [22 bmp-30  bmp] 30 bmp Vt Set:  [400 mL] 400 mL PEEP:  [5 cmH20] 5 cmH20 Plateau Pressure:  [23 cmH20-25 cmH20] 23 cmH20   Intake/Output Summary (Last 24 hours) at 07/23/2018 1420 Last data filed at 07/23/2018 1418 Gross per 24 hour  Intake 6358.62 ml  Output 250 ml  Net 6108.62 ml   Filed Weights   07/22/18 0148  Weight: 90.7 kg    Examination:  General sedated on vent  HENT NCAT no JVD pulm coarse but equal chest rise  Card RRR no MRG abd not tender + bowel sounds gu decreased UOP Neuro sedated   Resolved Hospital Problem list   NA  Assessment & Plan:   Septic shock 2/2 ischemic colitis and bacterial translocation. Now s/p emergent total colectomy w/ placement of ileostomy and cholecystectomy on 6/10 Plan  Day 3 zosyn Cont MAP goal > 65, CVP goal 8-12 F/u culture data Cont IVFs Post-op care per surg   Anion gap and non-anion gap metabolic acidosis in the setting of lactic acidosis as well as AKI and hyperkalemia  Bicarb gtt increased intra-op  Plan Repeating abg and chem at 4 p Cont current Ve to compensate for metabolic acidosis  Cont current bicarb infusion  May need kayexalate   Acute hypoxic respiratory failure.  Presumed secondary to worsening shock, possible aspiration event and superimposed on atelectasis from decreased abdominal compliance Portable chest x-ray personally reviewed: This demonstrates endotracheal tube in satisfactory position, left subclavian line in satisfactory position.  Elevated bilateral hemidiaphragms with left basilar atelectasis Plan Full ventilator support PAD protocol RAS goal -2 VAP bundle  Acute kidney injury: Baseline creatinine approximately 1.3 current creatinine 2.66.  Suspect secondary to shock state Plan Cont to trend  chemistry  Strict I&O Holding NSAIDs, antihypertensives and diuretics  Ensure goal CVP and MAP Renal dose meds   Type 2 diabetes mellitus Excellent glycemic control  plan Cont ssi   Elevated lipase,  pancreas unremarkable on CT imaging Plan Am lipase     Best practice:  Diet: h20 Pain/Anxiety/Delirium protocol (if indicated): na VAP protocol (if indicated): na DVT prophylaxis: scd GI prophylaxis: protonix Glucose control: sliding scale Mobility: bed Code Status: full Family Communication: discussed with patient Disposition:now s/p exploratory lap w/ total colectomy and cholecystectomy and placement of ileostomy. . Bicarb gtt increased intra-op and levophed gtt started. Currently MAP w/in goal. Received 2 liters of LR intra-op as well; we will cont supportive care. Plan for tonight:a)  repeat labs assessing for 1) blood loss 2) acid base 3) electrolyte balance. B) cont to wean FIO2 for sats > 92% c) cont to wean pressors; goal MAP > 65 (hoping to get of neo first), will cont to trend cvp goal 8-12. D) cont abx   Critical care time 34 min     Erick Colace ACNP-BC [place] Pager # [phone] OR # [phone] if no answer

## 2018-07-23 NOTE — Anesthesia Postprocedure Evaluation (Signed)
Anesthesia Post Note  Patient: Hailey Hughes  Procedure(s) Performed: TOTAL ABDOMINAL COLECTOMY, [name], CHOLECYSTECTOMY (N/A Abdomen)     Patient location during evaluation: SICU Anesthesia Type: General Level of consciousness: sedated Pain management: pain level controlled Vital Signs Assessment: post-procedure vital signs reviewed and stable Respiratory status: patient remains intubated per anesthesia plan Cardiovascular status: stable Postop Assessment: no apparent nausea or vomiting Anesthetic complications: no    Last Vitals:  Vitals:   07/23/18 1555 07/23/18 1600  BP:    Pulse:  86  Resp:  (!) 30  Temp: 36.7 C   SpO2:  96%    Last Pain:  Vitals:   07/23/18 1555  TempSrc: Axillary  PainSc:                  Javohn Basey S

## 2018-07-23 NOTE — Progress Notes (Signed)
eLink Physician-Brief Progress Note Patient Name: Hailey Hughes DOB: [birth year] MRN: [id number]   Date of Service  07/23/2018  HPI/Events of Note  Hypotension - BP = 90/39 with MAP = 55. Currently on Phenylephrine IV infusion at 200 mcg/min.   eICU Interventions  Will order: 1. Ceiling dose of Phenylephrine IV infusion increased to 400 mcg.min. 2. Vasopressin IV infusion at shock dose.  3. Ground team notified of need for CVL and CVP.     Intervention Category Major Interventions: Hypotension - evaluation and management  Rebekah Zackery Eugene 07/23/2018, 2:18 AM

## 2018-07-23 NOTE — Progress Notes (Signed)
CRITICAL VALUE ALERT  Critical Value:  Lactic Acid 3.5  Date & Time Notied:  07/23/2018 0140  Provider Notified: eLink called  Orders Received/Actions taken: Awaiting orders

## 2018-07-23 NOTE — Progress Notes (Signed)
Pt was intubated at [facility] by Dr. Mariane Masters. 2 of Versed was given by this RN at 0447 and 20 of etomidate was given by this RN at [id number].  Will continue to monitor pt closely.   Mariann Laster RN

## 2018-07-23 NOTE — Progress Notes (Signed)
Initial Nutrition Assessment  RD working remotely.   DOCUMENTATION CODES:   Obesity unspecified  INTERVENTION:  - if patient to remain intubated >/= 24 hours, recommend initiation of TF. - recommend Vital High Protein @ goal rate of 50 ml/hr to provide 1200 kcal, 105 grams protein, and 1003 ml free water.    NUTRITION DIAGNOSIS:   Inadequate oral intake related to inability to eat as evidenced by NPO status.  GOAL:   Provide needs based on ASPEN/SCCM guidelines  MONITOR:   Vent status, Labs, Weight trends  REASON FOR ASSESSMENT:   Ventilator  ASSESSMENT:   78 y.o. female with medical history significant for HTN, hyperlipidemia, prior abdominal surgery d/t megacolon when she was 78 years old, hypothyroidism, type 2 DM, and rheumatoid arthritis. She presented to [facility] due to abdominal pain, N/V x1 day. Stated she has been experiencing generalized weakness and thought it was due to constipation which she was experiencing for the 3 days PTA. Had an enema with minimal hard stool output 6/8. Since then has been experiencing worsening abdominal cramping with nausea and vomiting x2.  Patient was intubated and OGT placed this AM at ~4:50 AM. Patient was ordered CLD yesterday at 9:50 AM with no intakes documented between that time and time of intubation. Per chart review, current weight is 200 lb and she weighed 212 at [facility] on 02/18/18. This indicates 12 lb weight loss (5.7% body weight) in the past 5 months; not significant for time frame.  Per notes: dx of colitis, sepsis 2/2 colitis, lactic acidosis, AKI on hx of CKD stage 3, UTI, elevated lipase, chronic constipation with bowel regimen ordered   Patient is currently intubated on ventilator support MV: 11.8 L/min Temp (24hrs), Avg:97.5 F (36.4 C), Min:97.1 F (36.2 C), Max:97.6 F (36.4 C) Propofol: none  Medications reviewed; 20 mg IV pepcid BID, sliding scale novolog, 112 mcg oral synthroid/day, 40 mg IV protonix BID, 17 g  miralax/day, 2 tablets senokot BID, 50 mEq sodium bicarb x1 dose 6/10 Labs reviewed; CBG: 123 mg/dl, Cl: 113 mmol/l, BUN: 52 mg/dl, creatinine: 2.66 mg/dl, Ca: 7.3 mg/dl, LFTs elevated. IVF; 150 mEq sodium bicarb in sterile water @ 100 ml/hr.  Drips; fentanyl @ 100 mcg/hr, vaso @ 0.03 units/min, neo @ 250 mcg/min.     NUTRITION - FOCUSED PHYSICAL EXAM:  unable to complete at this time.   Diet Order:   Diet Order            Diet NPO time specified  Diet effective now              EDUCATION NEEDS:   No education needs have been identified at this time  Skin:  Skin Assessment: Reviewed RN Assessment  Last BM:  6/5  Height:   Ht Readings from Last 1 Encounters:  07/23/18 5\' 2"  (1.575 m)    Weight:   Wt Readings from Last 1 Encounters:  07/22/18 90.7 kg    Ideal Body Weight:  50 kg  BMI:  Body mass index is 36.58 kg/m.  Estimated Nutritional Needs:   Kcal:  1000-1270 kcal  Protein:  >/= 100 grams  Fluid:  >/= 1.8 L/day      Jarome Matin, MS, RD, LDN, [facility] Inpatient Clinical Dietitian Pager # [phone] After hours/weekend pager # [phone]

## 2018-07-23 NOTE — Transfer of Care (Signed)
Immediate Anesthesia Transfer of Care Note  Patient: Larita Fife Balthazar  Procedure(s) Performed: TOTAL ABDOMINAL COLECTOMY, [name] COLECYSTECTOMY (N/A Abdomen)  Patient Location: ICU  Anesthesia Type:General  Level of Consciousness: Patient remains intubated per anesthesia plan  Airway & Oxygen Therapy: Patient remains intubated per anesthesia plan and Patient placed on Ventilator (see vital sign flow sheet for setting)  Post-op Assessment: Report given to RN and Post -op Vital signs reviewed and stable  Post vital signs: Reviewed and stable  Last Vitals:  Vitals Value Taken Time  BP    Temp    Pulse 82 07/23/2018  2:19 PM  Resp 30 07/23/2018  2:19 PM  SpO2 95 % 07/23/2018  2:19 PM  Vitals shown include unvalidated device data.  Last Pain:  Vitals:   07/23/18 0745  TempSrc: Axillary  PainSc:       Patients Stated Pain Goal: 2 (93/57/01 [id number])  Complications: No apparent anesthesia complications

## 2018-07-23 NOTE — Progress Notes (Signed)
eLink Physician-Brief Progress Note Patient Name: TEMEKIA CASKEY DOB: 12/17/1940 MRN: [id number]   Date of Service  07/23/2018  HPI/Events of Note  ABG on 100%/PRVC 22/TV 400/P 5 = 7.084/36.5/66.6/10.4.  eICU Interventions  Will order: 1. NaHCO3 200 meq IV now.  2. NaHCO3 IV infusion to run at 100 mL/hour. 3. Increase PRVC rate to 30. 4. Repeat ABG at 8 AM.      Intervention Category Major Interventions: Acid-Base disturbance - evaluation and management;Respiratory failure - evaluation and management  Sommer,Steven Cornelia Copa 07/23/2018, 6:26 AM

## 2018-07-23 NOTE — Progress Notes (Signed)
CRITICAL VALUE ALERT  Critical Value:  Calcium 5.9   Date & Time Notied:  07/23/2018 2100  Provider Notified: E-link  Orders Received/Actions taken: no new orders

## 2018-07-23 NOTE — Consult Note (Addendum)
[facility] Surgery Consult Note  Hailey Hughes [facility] 06-Nov-1940  [id number].    Requesting MD: CCM Zenia Resides NP Chief Complaint:  Abdominal pain Reason for Consult: abdominal pain, acute acidosis, hypotension, intubated and on pressors.  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  She has a history of megacolon and colon resection at age 25.  Rectovaginal fistula closure, cystectomy, and prior appendectomy.  All the history is taken from the chart, patient is unable to give any history.  CT scan shows small hiatal hernia pericolonic inflammation changes noted in the distal transverse colon thought to be a generalized colitis. Mild ascites, mild atelectasis.  She was admitted but transferred to the ICU.  With slurred speech, very tender abdomen.  She was on Neo-Synephrine.  She had a lactate of 4.2.  An elevated white count.  Subsequently been intubated and severely acidotic.  She continues to have a progressive decline and is acutely tender on exam by critical care medicine.  We are asked to see.    ROS: Review of Systems  Unable to perform ROS: Intubated    Family History  Problem Relation Age of Onset  . Heart attack Father   . Hypertension Father   . Hypertension Mother   . Diabetes Sister   . Colon cancer Maternal Uncle   . Heart disease Brother   . Colon polyps Sister   . Esophageal cancer Neg Hx   . Kidney disease Neg Hx     Past Medical History:  Diagnosis Date  . Depression   . Diverticulosis   . DM2 (diabetes mellitus, type 2) (HCC)   . Hiatal hernia   . HTN (hypertension)   . Hyperlipidemia   . Hypothyroidism   . Imperforate anus   . Obesity   . OSA (obstructive sleep apnea)   . Palpitations   . Reflux esophagitis     Past Surgical History:   Procedure Laterality Date  . APPENDECTOMY    . CARPAL TUNNEL RELEASE Right   . COLON RESECTION     age 24  . 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 CYSTECTOMY     spinal  . RECTOVAGINAL FISTULA CLOSURE     congenital    Social History:  reports that she has never smoked. She has never used smokeless tobacco. She reports that she does not drink alcohol or use drugs.  Allergies:  Allergies  Allergen Reactions  . Bee Venom Anaphylaxis  . Epinephrine Other (See Comments)    REACTION: Nervousness     Medications Prior to Admission  Medication Sig Dispense Refill  . amLODipine-benazepril (LOTREL) 5-20 MG per capsule Take 1 capsule by mouth every evening.     Marland Kitchen aspirin 81 MG tablet Take 81 mg by mouth every evening.     Marland Kitchen atorvastatin (LIPITOR) 20 MG tablet Take 20 mg by mouth every evening.     . bumetanide (BUMEX) 2 MG tablet Take 2 mg by mouth every evening.     . celecoxib (CELEBREX) 200 MG capsule Take 200 mg by mouth every evening.     . febuxostat (ULORIC) 40 MG tablet Take 40 mg by mouth every evening.    . fexofenadine (ALLEGRA) 180 MG tablet Take 180 mg by mouth daily as needed for allergies.     Marland Kitchen  fluticasone (FLONASE) 50 MCG/ACT nasal spray Place 2 sprays into both nostrils daily as needed for allergies.     Marland Kitchen HYDROcodone-acetaminophen (NORCO) 7.5-325 MG tablet Take 1 tablet by mouth 3 (three) times daily as needed.    Marland Kitchen levothyroxine (SYNTHROID, LEVOTHROID) 112 MCG tablet Take 112 mcg by mouth daily.    . metFORMIN (GLUCOPHAGE-XR) 500 MG 24 hr tablet Take 1,000 mg by mouth every evening.     . metoprolol succinate (TOPROL-XL) 50 MG 24 hr tablet Take 50 mg by mouth daily.    . montelukast (SINGULAIR) 10 MG tablet Take 10 mg by mouth daily as needed (allergies).     Marland Kitchen olopatadine (PATANOL) 0.1 % ophthalmic solution Place 1 drop into both eyes daily as needed for allergies.     . pantoprazole (PROTONIX) 40 MG tablet Take 1 tablet (40 mg total) by mouth daily. 90  tablet 3  . senna (SENOKOT) 8.6 MG tablet Take 1 tablet by mouth daily as needed for constipation.    Marland Kitchen tiZANidine (ZANAFLEX) 4 MG capsule Take 4 mg by mouth 2 (two) times daily as needed for muscle spasms.     Marland Kitchen tobramycin-dexamethasone (TOBRADEX) ophthalmic ointment Place 1 application into both eyes 2 (two) times daily.    Marland Kitchen triamcinolone cream (KENALOG) 0.1 % Apply 1 application topically daily as needed (ezcema).     . colestipol (COLESTID) 1 G tablet Take 2 tablets by mouth every morning and 1 tablet by mouth every evening. (Patient not taking: Reported on 07/22/2018) 270 tablet 3    Blood pressure 121/60, pulse 77, temperature (!) 97.1 F (36.2 C), temperature source Axillary, resp. rate 17, height  (1.575 m), weight 90.7 kg, SpO2 95 %. Physical Exam: Physical Exam Constitutional:      General: She is in acute distress.     Appearance: She is obese. She is ill-appearing and toxic-appearing. She is not diaphoretic.  HENT:     Head: Normocephalic.     Comments: Patient is intubated, cannot talk.  She is following but not really answering questions that she can shake her head yes or no to. Cardiovascular:     Rate and Rhythm: Regular rhythm. Tachycardia present.     Heart sounds: No murmur.  Pulmonary:     Comments: Patient's intubated but chest is clear to auscultation anteriorly. Abdominal:     General: Abdomen is protuberant. Bowel sounds are absent.     Tenderness: There is generalized abdominal tenderness.     Hernia: No hernia is present.  Genitourinary:    Rectum: Guaiac result positive (Rectum examined by Dr. Derrell Lolling it is patent, with some slight stenosis.).  Skin:    Comments: Cool and dry  Neurological:     Comments: Intubated and not answering yes or no questions with head nod. She follows you but not responding   Psychiatric:     Comments: Could not be assessed.     Results for orders placed or performed during the hospital encounter of 07/22/18 (from the past  48 hour(s))  POC occult blood, ED Provider will collect     Status: Abnormal   Collection Time: 07/22/18  2:45 AM  Result Value Ref Range   Fecal Occult Bld POSITIVE (A) NEGATIVE  Hemoglobin A1c     Status: Abnormal   Collection Time: 07/22/18  2:45 AM  Result Value Ref Range   Hgb A1c MFr Bld 7.0 (H) 4.8 - 5.6 %    Comment: (NOTE) Pre diabetes:  5.7%-6.4% Diabetes:              >6.4% Glycemic control for   <7.0% adults with diabetes    Mean Plasma Glucose 154.2 mg/dL    Comment: Performed at [facility], [address], [place], [place] [address]  Comprehensive metabolic panel     Status: Abnormal   Collection Time: 07/22/18  2:47 AM  Result Value Ref Range   Sodium 140 135 - 145 mmol/L   Potassium 3.6 3.5 - 5.1 mmol/L   Chloride 108 98 - 111 mmol/L   CO2 18 (L) 22 - 32 mmol/L   Glucose, Bld 215 (H) 70 - 99 mg/dL   BUN 42 (H) 8 - 23 mg/dL   Creatinine, Ser 6.04 (H) 0.44 - 1.00 mg/dL   Calcium 9.0 8.9 - 54.0 mg/dL   Total Protein 6.2 (L) 6.5 - 8.1 g/dL   Albumin 3.8 3.5 - 5.0 g/dL   AST 36 15 - 41 U/L   ALT 26 0 - 44 U/L   Alkaline Phosphatase 60 38 - 126 U/L   Total Bilirubin 0.7 0.3 - 1.2 mg/dL   GFR calc non Af Amer 23 (L) >60 mL/min   GFR calc Af Amer 26 (L) >60 mL/min   Anion gap 14 5 - 15    Comment: Performed at [facility], [address] [place], [place] [address]  Lipase, blood     Status: Abnormal   Collection Time: 07/22/18  2:47 AM  Result Value Ref Range   Lipase 55 (H) 11 - 51 U/L    Comment: Performed at [facility], [address], [place], [place] [address]  CBC with Differential     Status: Abnormal   Collection Time: 07/22/18  2:47 AM  Result Value Ref Range   WBC 17.6 (H) 4.0 - 10.5 K/uL   RBC 5.07 3.87 - 5.11 MIL/uL   Hemoglobin 15.0 12.0 - 15.0 g/dL   HCT 95.6 (H) 21.3 - 08.6 %   MCV 92.5 80.0 - 100.0 fL   MCH 29.6 26.0 - 34.0 pg   MCHC 32.0 30.0 - 36.0 g/dL   RDW 57.8 46.9 - 62.9 %    Platelets 191 150 - 400 K/uL   nRBC 0.0 0.0 - 0.2 %   Neutrophils Relative % 91 %   Neutro Abs 15.9 (H) 1.7 - 7.7 K/uL   Lymphocytes Relative 7 %   Lymphs Abs 1.2 0.7 - 4.0 K/uL   Monocytes Relative 2 %   Monocytes Absolute 0.3 0.1 - 1.0 K/uL   Eosinophils Relative 0 %   Eosinophils Absolute 0.1 0.0 - 0.5 K/uL   Basophils Relative 0 %   Basophils Absolute 0.1 0.0 - 0.1 K/uL   Immature Granulocytes 0 %   Abs Immature Granulocytes 0.06 0.00 - 0.07 K/uL    Comment: Performed at [facility], [address], [place], [place] [address]  Lactic acid, plasma     Status: Abnormal   Collection Time: 07/22/18  2:47 AM  Result Value Ref Range   Lactic Acid, Venous 5.0 (HH) 0.5 - 1.9 mmol/L    Comment: CRITICAL RESULT CALLED TO, READ BACK BY AND VERIFIED WITH: RN Shelly Bombard AT 3244 07/22/18 CRUICKSHANK A Performed at [facility], [address] [place], [place] [address]   Urinalysis, Routine w reflex microscopic     Status: Abnormal   Collection Time: 07/22/18  2:47 AM  Result Value Ref Range  Color, Urine AMBER (A) YELLOW    Comment: BIOCHEMICALS MAY BE AFFECTED BY COLOR   APPearance CLOUDY (A) CLEAR   Specific Gravity, Urine 1.013 1.005 - 1.030   pH 5.0 5.0 - 8.0   Glucose, UA NEGATIVE NEGATIVE mg/dL   Hgb urine dipstick SMALL (A) NEGATIVE   Bilirubin Urine NEGATIVE NEGATIVE   Ketones, ur 5 (A) NEGATIVE mg/dL   Protein, ur 100 (A) NEGATIVE mg/dL   Nitrite NEGATIVE NEGATIVE   Leukocytes,Ua SMALL (A) NEGATIVE   RBC / HPF 6-10 0 - 5 RBC/hpf   WBC, UA >50 (H) 0 - 5 WBC/hpf   Bacteria, UA MANY (A) NONE SEEN   Squamous Epithelial / LPF 11-20 0 - 5   WBC Clumps PRESENT    Mucus PRESENT    Hyaline Casts, UA PRESENT    Amorphous Crystal PRESENT     Comment: Performed at [facility], [place] [address], [place], [place] [address]  Lactic acid, plasma     Status: Abnormal   Collection Time: 07/22/18  5:48 AM  Result Value Ref Range    Lactic Acid, Venous 5.0 (HH) 0.5 - 1.9 mmol/L    Comment: CRITICAL RESULT CALLED TO, READ BACK BY AND VERIFIED WITH: OXENDINE,J.RN AT [phone] 07/22/18 MULLINS,T Performed at [facility], [place] [address] [place], [place] [address]   Lactic acid, plasma     Status: Abnormal   Collection Time: 07/22/18  7:38 AM  Result Value Ref Range   Lactic Acid, Venous 4.0 (HH) 0.5 - 1.9 mmol/L    Comment: CRITICAL RESULT CALLED TO, READ BACK BY AND VERIFIED WITHRegino Schultze RN AT [phone] 07/22/18 MULLINS,T Performed at [facility], [place] [address], [place], [place] [address]   SARS Coronavirus 2 (CEPHEID - Performed in [place] hospital lab), Hosp Order     Status: None   Collection Time: 07/22/18  9:32 AM  Result Value Ref Range   SARS Coronavirus 2 NEGATIVE NEGATIVE    Comment: (NOTE) If result is NEGATIVE SARS-CoV-2 target nucleic acids are NOT DETECTED. The SARS-CoV-2 RNA is generally detectable in upper and lower  respiratory specimens during the acute phase of infection. The lowest  concentration of SARS-CoV-2 viral copies this assay can detect is 250  copies / mL. A negative result does not preclude SARS-CoV-2 infection  and should not be used as the sole basis for treatment or other  patient management decisions.  A negative result may occur with  improper specimen collection / handling, submission of specimen other  than nasopharyngeal swab, presence of viral mutation(s) within the  areas targeted by this assay, and inadequate number of viral copies  (<250 copies / mL). A negative result must be combined with clinical  observations, patient history, and epidemiological information. If result is POSITIVE SARS-CoV-2 target nucleic acids are DETECTED. The SARS-CoV-2 RNA is generally detectable in upper and lower  respiratory specimens dur ing the acute phase of infection.  Positive  results are indicative of active infection with SARS-CoV-2.  Clinical   correlation with patient history and other diagnostic information is  necessary to determine patient infection status.  Positive results do  not rule out bacterial infection or co-infection with other viruses. If result is PRESUMPTIVE POSTIVE SARS-CoV-2 nucleic acids MAY BE PRESENT.   A presumptive positive result was obtained on the submitted specimen  and confirmed on repeat testing.  While 2019 novel coronavirus  (SARS-CoV-2) nucleic acids may be present in the submitted sample  additional confirmatory testing may be necessary for epidemiological  and / or clinical management purposes  to differentiate between  SARS-CoV-2 and other Sarbecovirus currently known to infect humans.  If clinically indicated additional testing with an alternate test  methodology [phone]) is advised. The SARS-CoV-2 RNA is generally  detectable in upper and lower respiratory sp ecimens during the acute  phase of infection. The expected result is Negative. Fact Sheet for Patients:  BoilerBrush.com.cy Fact Sheet for Healthcare Providers: https://pope.com/ This test is not yet approved or cleared by the [place] FDA and has been authorized for detection and/or diagnosis of SARS-CoV-2 by FDA under an Emergency Use Authorization (EUA).  This EUA will remain in effect (meaning this test can be used) for the duration of the COVID-19 declaration under Section 564(b)(1) of the Act, 21 U.S.C. section 360bbb-3(b)(1), unless the authorization is terminated or revoked sooner. Performed at [facility], [address], [place], [place] [address]   CBG monitoring, ED     Status: Abnormal   Collection Time: 07/22/18 12:31 PM  Result Value Ref Range   Glucose-Capillary 152 (H) 70 - 99 mg/dL  Lactic acid, plasma     Status: Abnormal   Collection Time: 07/22/18 12:59 PM  Result Value Ref Range   Lactic Acid, Venous 5.0 (HH) 0.5 - 1.9 mmol/L     Comment: CRITICAL RESULT CALLED TO, READ BACK BY AND VERIFIED WITH: BINGHAM,S. RN @1350  ON 06.09.2020 BY COHEN,K Performed at [facility], [address], [place], [place] [address]   TSH     Status: None   Collection Time: 07/22/18  2:15 PM  Result Value Ref Range   TSH 2.549 0.350 - 4.500 uIU/mL    Comment: Performed by a 3rd Generation assay with a functional sensitivity of <=0.01 uIU/mL. Performed at [facility], [address], [place], [place] [address]   Glucose, capillary     Status: Abnormal   Collection Time: 07/22/18  5:38 PM  Result Value Ref Range   Glucose-Capillary 131 (H) 70 - 99 mg/dL   Comment 1 Notify RN    Comment 2 Document in Chart   Lactic acid, plasma     Status: Abnormal   Collection Time: 07/22/18  6:02 PM  Result Value Ref Range   Lactic Acid, Venous 5.4 (HH) 0.5 - 1.9 mmol/L    Comment: CRITICAL RESULT CALLED TO, READ BACK BY AND VERIFIED WITH: J.DEBRULER AT 1905 ON 07/22/18 BY N.THOMPSON Performed at [facility], [address] [place], [place] [address]   MRSA PCR Screening     Status: None   Collection Time: 07/22/18  8:20 PM  Result Value Ref Range   MRSA by PCR NEGATIVE NEGATIVE    Comment:        The GeneXpert MRSA Assay (FDA approved for NASAL specimens only), is one component of a comprehensive MRSA colonization surveillance program. It is not intended to diagnose MRSA infection nor to guide or monitor treatment for MRSA infections. Performed at [facility], [address], [place], [place] [address]   CBC with Differential/Platelet     Status: Abnormal   Collection Time: 07/22/18  8:36 PM  Result Value Ref Range   WBC 13.9 (H) 4.0 - 10.5 K/uL   RBC 3.96 3.87 - 5.11 MIL/uL   Hemoglobin 11.6 (L) 12.0 - 15.0 g/dL   HCT 95.1 88.4 - 16.6 %   MCV 100.8 (H) 80.0 - 100.0 fL  Comment: REPEATED TO VERIFY DELTA CHECK NOTED    MCH 29.3 26.0 - 34.0 pg   MCHC  29.1 (L) 30.0 - 36.0 g/dL   RDW 16.114.9 09.611.5 - 04.515.5 %   Platelets 130 (L) 150 - 400 K/uL    Comment: REPEATED TO VERIFY PLATELET COUNT CONFIRMED BY SMEAR SPECIMEN CHECKED FOR CLOTS    nRBC 0.0 0.0 - 0.2 %   Neutrophils Relative % 84 %   Neutro Abs 11.6 (H) 1.7 - 7.7 K/uL   Lymphocytes Relative 4 %   Lymphs Abs 0.6 (L) 0.7 - 4.0 K/uL   Monocytes Relative 11 %   Monocytes Absolute 1.6 (H) 0.1 - 1.0 K/uL   Eosinophils Relative 0 %   Eosinophils Absolute 0.1 0.0 - 0.5 K/uL   Basophils Relative 0 %   Basophils Absolute 0.1 0.0 - 0.1 K/uL   WBC Morphology DOHLE BODIES     Comment: MILD LEFT SHIFT (1-5% METAS, OCC MYELO, OCC BANDS) VACUOLATED NEUTROPHILS    Immature Granulocytes 1 %   Abs Immature Granulocytes 0.07 0.00 - 0.07 K/uL   Polychromasia PRESENT     Comment: Performed at [facility], [address] [place], [place] [address]  Comprehensive metabolic panel     Status: Abnormal   Collection Time: 07/22/18  8:36 PM  Result Value Ref Range   Sodium 139 135 - 145 mmol/L   Potassium 5.1 3.5 - 5.1 mmol/L    Comment: DELTA CHECK NOTED REPEATED TO VERIFY NO VISIBLE HEMOLYSIS    Chloride 113 (H) 98 - 111 mmol/L   CO2 13 (L) 22 - 32 mmol/L   Glucose, Bld 148 (H) 70 - 99 mg/dL   BUN 52 (H) 8 - 23 mg/dL   Creatinine, Ser 1.912.66 (H) 0.44 - 1.00 mg/dL   Calcium 7.3 (L) 8.9 - 10.3 mg/dL   Total Protein 4.6 (L) 6.5 - 8.1 g/dL   Albumin 2.6 (L) 3.5 - 5.0 g/dL   AST 65 (H) 15 - 41 U/L   ALT 47 (H) 0 - 44 U/L   Alkaline Phosphatase 41 38 - 126 U/L   Total Bilirubin 0.6 0.3 - 1.2 mg/dL   GFR calc non Af Amer 16 (L) >60 mL/min   GFR calc Af Amer 19 (L) >60 mL/min   Anion gap 13 5 - 15    Comment: Performed at [facility], [address] [place], [place] [address]  Lactic acid, plasma     Status: Abnormal   Collection Time: 07/22/18  8:36 PM  Result Value Ref Range   Lactic Acid, Venous 4.2 (HH) 0.5 - 1.9 mmol/L    Comment: CRITICAL RESULT  CALLED TO, READ BACK BY AND VERIFIED WITHPeyton Najjar: S LANEY RN 2109 07/22/18 A NAVARRO Performed at [facility], [address] [place], [place] [address]   Glucose, capillary     Status: Abnormal   Collection Time: 07/22/18 11:48 PM  Result Value Ref Range   Glucose-Capillary 122 (H) 70 - 99 mg/dL  Lipase, blood     Status: None   Collection Time: 07/23/18 12:57 AM  Result Value Ref Range   Lipase 22 11 - 51 U/L    Comment: Performed at [facility], [address] [place], [place] [address]  Lactic acid, plasma     Status: Abnormal   Collection Time: 07/23/18 12:57 AM  Result Value Ref Range   Lactic Acid, Venous 3.5 (HH) 0.5 - 1.9 mmol/L  Comment: CRITICAL RESULT CALLED TO, READ BACK BY AND VERIFIED WITHPeyton Najjar: S LANEY RN  [phone] 07/23/18 A NAVARRO Performed at [facility], [address] [place], [place] [address]   Blood gas, arterial     Status: Abnormal   Collection Time: 07/23/18  4:27 AM  Result Value Ref Range   O2 Content 15.0 L/min   Delivery systems NONREBREATHER AND HFNC    pH, Arterial 7.075 (LL) 7.350 - 7.450    Comment: CRITICAL RESULT CALLED TO, READ BACK BY AND VERIFIED WITH: DR Rande LawmanERWIN AT [phone] BY ANNALISSA AGUSTIN,RRT,RCP ON 07/23/18    pCO2 arterial 46.5 32.0 - 48.0 mmHg   pO2, Arterial 72.0 (L) 83.0 - 108.0 mmHg   Bicarbonate 13.1 (L) 20.0 - 28.0 mmol/L   Acid-base deficit 16.7 (H) 0.0 - 2.0 mmol/L   O2 Saturation 92.8 %   Patient temperature 97.6    Collection site A-LINE    Drawn by [phone]    Sample type ARTERIAL    Allens test (pass/fail) PASS PASS    Comment: Performed at [facility], [address] [place], [place] [address]  Draw ABG 1 hour after initiation of ventilator     Status: Abnormal   Collection Time: 07/23/18  6:00 AM  Result Value Ref Range   FIO2 100.00    Delivery systems VENTILATOR    Mode PRESSURE REGULATED VOLUME CONTROL    VT 0.400 mL   LHR 22 resp/min    Peep/cpap 5.0 cm H20   pH, Arterial 7.091 (LL) 7.350 - 7.450    Comment: CRITICAL RESULT CALLED TO, READ BACK BY AND VERIFIED WITH: DR. Richarda OverlieSOMNERS MD AT [phone] BY PATRICK SWEENEY RRT,RCP ON 07/23/2018    pCO2 arterial 35.5 32.0 - 48.0 mmHg   pO2, Arterial 64.2 (L) 83.0 - 108.0 mmHg   Bicarbonate 10.4 (L) 20.0 - 28.0 mmol/L   Acid-base deficit 18.5 (H) 0.0 - 2.0 mmol/L   O2 Saturation 91.2 %   Patient temperature 97.6    Collection site A-LINE    Drawn by [phone]    Sample type ARTERIAL DRAW     Comment: Performed at [facility], [address] [place] [address] [place], [place] [address]  Glucose, capillary     Status: Abnormal   Collection Time: 07/23/18  7:46 AM  Result Value Ref Range   Glucose-Capillary 123 (H) 70 - 99 mg/dL  Blood gas, arterial     Status: Abnormal   Collection Time: 07/23/18  8:07 AM  Result Value Ref Range   FIO2 100.00    Delivery systems VENTILATOR    Mode PRESSURE REGULATED VOLUME CONTROL    VT 400 mL   LHR 30 resp/min   Peep/cpap 5.0 cm H20   pH, Arterial 7.269 (L) 7.350 - 7.450   pCO2 arterial 43.1 32.0 - 48.0 mmHg   pO2, Arterial 54.6 (L) 83.0 - 108.0 mmHg   Bicarbonate 19.1 (L) 20.0 - 28.0 mmol/L   Acid-base deficit 7.0 (H) 0.0 - 2.0 mmol/L   O2 Saturation 90.5 %   Patient temperature 98.6    Collection site A-LINE    Drawn by [phone]    Sample type ARTERIAL DRAW    Allens test (pass/fail) PASS PASS    Comment: Performed at [facility], [address] [place], [place] [address]   Ct Abdomen Pelvis Wo Contrast  Result Date: 07/22/2018 CLINICAL DATA:  Abdominal pain for several days EXAM: CT ABDOMEN AND PELVIS WITHOUT CONTRAST TECHNIQUE: Multidetector CT  imaging of the abdomen and pelvis was performed following the standard protocol without IV contrast. COMPARISON:  MRI from 06/19/2008 FINDINGS: Lower chest: Dependent atelectatic changes are noted. Hepatobiliary: Liver is well visualized and within normal limits. A mild amount of  perihepatic ascites is noted. The gallbladder is well distended without significant inflammatory change. Pancreas: Unremarkable. No pancreatic ductal dilatation or surrounding inflammatory changes. Spleen: Normal in size without focal abnormality. Adrenals/Urinary Tract: Adrenal glands are within normal limits. Kidneys are well visualized bilaterally. 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 No obstructive changes are seen. The ureters and urinary bladder are within normal limits. Stomach/Bowel: Changes of reflux are noted. Small hiatal hernia is noted. Colon is distended with fluid and fecal material although no obstructive lesion is seen. Very mild pericolonic inflammatory changes are noted in the distal transverse colon. This may represent a generalized colitis. No small bowel abnormality is seen. Vascular/Lymphatic: Aortic atherosclerosis. No enlarged abdominal or pelvic lymph nodes. Reproductive: Uterus and bilateral adnexa are unremarkable. Other: Minimal free fluid is noted within the pelvis. Postsurgical changes are noted in the anterior abdominal wall consistent with the given clinical history. Musculoskeletal: Degenerative changes are noted. IMPRESSION: Mild dependent atelectatic changes. Well distended gallbladder without definitive signs of inflammatory change. Colon is mildly distended with fluid and fecal material with very mild pericolonic changes which may represent a generalized colitis. Mild ascites Mild hiatal hernia. Stable right renal angiomyolipoma. Electronically Signed   By: Alcide Clever [name]   On: 07/22/2018 07:33   Dg Abd 1 View  Result Date: 07/23/2018 CLINICAL DATA:  Check gastric catheter placement EXAM: ABDOMEN - 1 VIEW COMPARISON:  None. FINDINGS: Gastric catheter is noted within the stomach. No free air is seen. Paucity of bowel gas is noted. IMPRESSION: Gastric catheter within the stomach. Electronically Signed   By: Alcide Clever [name]   On: 07/23/2018 08:25   Dg Chest Port 1  View  Result Date: 07/23/2018 CLINICAL DATA:  Respiratory failure EXAM: PORTABLE CHEST 1 VIEW COMPARISON:  07/23/2018 FINDINGS: Cardiac shadow is stable. Endotracheal tube, gastric catheter and left subclavian central line are noted. Patchy left basilar atelectasis is seen. No other focal abnormality is noted. Overall inspiratory effort is poor. IMPRESSION: Left basilar atelectasis. Tubes and lines as described. Electronically Signed   By: Alcide Clever [name]   On: 07/23/2018 08:26   Dg Chest Port 1 View  Result Date: 07/23/2018 CLINICAL DATA:  Sepsis EXAM: PORTABLE CHEST 1 VIEW COMPARISON:  Chest x-ray dated 05/06/2014. FINDINGS: The lung volumes are low. There is a left subclavian central venous catheter in place with tip projecting over the SVC. There is a left basilar airspace opacity. No pneumothorax. There may be trace bilateral pleural effusions. There is no acute osseous abnormality. IMPRESSION: 1. Well-positioned left subclavian central venous catheter without evidence of a pneumothorax. 2. Left basilar airspace opacity concerning for pneumonia. 3. Low lung volumes. Electronically Signed   By: Katherine Mantle [name]   On: 07/23/2018 03:44      Assessment/Plan Type 2 diabetes Hypothyroid Rheumatoid arthritis Struct of sleep apnea Morbid obesity BMI 36.58   Colitis Multiple abdominal surgeries in the past Septic shock with peritonitis Acute kidney injury   Plan: Patient's been seen and evaluated by Dr. Derrell Lolling.  We are attempting to contact the family.  Based on the patient's acute decline she will need exploratory laparotomy this a.m.   Sherrie [facility] Surgery 07/23/2018, 10:07 AM Pager: [phone]

## 2018-07-23 NOTE — Progress Notes (Signed)
Pharmacy - Hyperkalemia  Assessment:    Please see note from Dia Sitter, PharmD earlier today for full details.  Briefly, 78 y.o. female who received CaGluconate 1g for hyperkalemia. Since this formulation contains less elemental Ca than CaCl, CCM authorized an additional gram of CaGluc if Ca x Phos product < 50   Ca 7.0 corrected, Phos 5.1; Ca x Phos ~ 36  K returned to normal on most recent Bmet (although without binder given, likely temporary if total body K truly elevated)  Plan:   Repeat CaGluconate 1 g IV x 1  F/u labs per [place], PharmD, BCPS [phone] 07/23/2018, 9:29 PM

## 2018-07-23 NOTE — Progress Notes (Signed)
Pharmacy: re- calcium for hypercalcemia  Calcium Chloride ordered as IV push for hyperkalemia. Unable to get in touch with Dr. Halford Chessman via secure chat and pager to clarify if this can be changed to calcium gluconate IVPB. Spoke to Dr. Oletta Darter at [facility], ok to change to calcium gluconate 1 gm IV x1now. Dr. Oletta Darter will check phosphorous level now and if cacium/phos product is <50, then ok for pharmacy to give an additional 1 gm of calcium gluconate  Dia Sitter, PharmD, BCPS 07/23/2018 7:03 PM

## 2018-07-23 NOTE — Anesthesia Preprocedure Evaluation (Addendum)
Anesthesia Evaluation  Patient identified by MRN, date of birth, ID band Patient unresponsive  General Assessment Comment:Septic shock from peritonitis  Reviewed: Allergy & Precautions, Patient's Chart, lab work & pertinent test resultsPreop documentation limited or incomplete due to emergent nature of procedure.  Airway Mallampati: Intubated  TM Distance: >3 FB Neck ROM: Full    Dental no notable dental hx.    Pulmonary sleep apnea ,    Pulmonary exam normal breath sounds clear to auscultation       Cardiovascular hypertension, Normal cardiovascular exam Rhythm:Regular Rate:Normal     Neuro/Psych negative neurological ROS  negative psych ROS   GI/Hepatic negative GI ROS, Neg liver ROS,   Endo/Other  diabetesHypothyroidism Morbid obesity  Renal/GU ARFRenal disease  negative genitourinary   Musculoskeletal negative musculoskeletal ROS (+)   Abdominal   Peds negative pediatric ROS (+)  Hematology negative hematology ROS (+)   Anesthesia Other Findings   Reproductive/Obstetrics negative OB ROS                             Anesthesia Physical Anesthesia Plan  ASA: V and emergent  Anesthesia Plan: General   Post-op Pain Management:    Induction: Inhalational  PONV Risk Score and Plan: 0  Airway Management Planned: Oral ETT  Additional Equipment: CVP  Intra-op Plan:   Post-operative Plan: Post-operative intubation/ventilation  Informed Consent: I have reviewed the patients History and Physical, chart, labs and discussed the procedure including the risks, benefits and alternatives for the proposed anesthesia with the patient or authorized representative who has indicated his/her understanding and acceptance.       Plan Discussed with: CRNA and Surgeon  Anesthesia Plan Comments:         Anesthesia Quick Evaluation

## 2018-07-23 NOTE — Procedures (Signed)
Intubation Procedure Note Hailey Hughes [id number] 11/14/1940  Procedure: Intubation Indications: Respiratory insufficiency  Procedure Details Consent: Unable to obtain consent because of altered level of consciousness. Time Out: Verified patient identification, verified procedure, site/side was marked, verified correct patient position, special equipment/implants available, medications/allergies/relevent history reviewed, required imaging and test results available.  Performed  Maximum sterile technique was used including gloves, gown and mask.  3    Evaluation Hemodynamic Status: BP stable throughout; O2 sats: stable throughout Patient's Current Condition: stable Complications: Complications of patient began vomiting prior to initiation of intubation. Patient did tolerate procedure well. Chest X-ray ordered to verify placement.  CXR: pending.   Hailey Hughes 07/23/2018

## 2018-07-23 NOTE — Procedures (Signed)
Arterial Catheter Insertion Procedure Note Hailey Hughes [id number] 11-26-1940  Procedure: Insertion of Arterial Catheter  Indications: Blood pressure monitoring and Frequent blood sampling  Procedure Details Consent: Risks of procedure as well as the alternatives and risks of each were explained to the (patient/caregiver).  Consent for procedure obtained. Time Out: Verified patient identification, verified procedure, site/side was marked, verified correct patient position, special equipment/implants available, medications/allergies/relevent history reviewed, required imaging and test results available.  Performed  Maximum sterile technique was used including antiseptics, cap, gloves, gown, hand hygiene, mask and sheet. Skin prep: Chlorhexidine; local anesthetic administered 22 gauge catheter was inserted into left radial artery using the Seldinger technique. ULTRASOUND GUIDANCE USED: NO Evaluation Blood flow good; BP tracing good. Complications: No apparent complications.   Winferd Humphrey 07/23/2018

## 2018-07-23 NOTE — Progress Notes (Signed)
CRITICAL VALUE ALERT  Critical Value:  Lactic Acid 4.9  Date & Time Notied:  07/23/18 1030  Provider Notified: Halford Chessman MD  Orders Received/Actions taken: Awaiting further orders

## 2018-07-23 NOTE — Progress Notes (Signed)
Attempted to call husband to update him, but received no answer.   Will attempt again later.  Mariann Laster RN

## 2018-07-24 ENCOUNTER — Inpatient Hospital Stay (HOSPITAL_COMMUNITY): Payer: Medicare Other

## 2018-07-24 ENCOUNTER — Encounter (HOSPITAL_COMMUNITY): Payer: Self-pay | Admitting: General Surgery

## 2018-07-24 DIAGNOSIS — K559 Vascular disorder of intestine, unspecified: Secondary | ICD-10-CM

## 2018-07-24 LAB — CBC
HCT: 32.4 % — ABNORMAL LOW (ref 36.0–46.0)
Hemoglobin: 10.3 g/dL — ABNORMAL LOW (ref 12.0–15.0)
MCH: 29 pg (ref 26.0–34.0)
MCHC: 31.8 g/dL (ref 30.0–36.0)
MCV: 91.3 fL (ref 80.0–100.0)
Platelets: 127 10*3/uL — ABNORMAL LOW (ref 150–400)
RBC: 3.55 MIL/uL — ABNORMAL LOW (ref 3.87–5.11)
RDW: 15.8 % — ABNORMAL HIGH (ref 11.5–15.5)
WBC: 8 10*3/uL (ref 4.0–10.5)
nRBC: 0 % (ref 0.0–0.2)

## 2018-07-24 LAB — BASIC METABOLIC PANEL
Anion gap: 17 — ABNORMAL HIGH (ref 5–15)
Anion gap: 10 (ref 5–15)
BUN: 55 mg/dL — ABNORMAL HIGH (ref 8–23)
BUN: 48 mg/dL — ABNORMAL HIGH (ref 8–23)
CO2: 20 mmol/L — ABNORMAL LOW (ref 22–32)
CO2: 27 mmol/L (ref 22–32)
Calcium: 6.3 mg/dL — CL (ref 8.9–10.3)
Calcium: 6.8 mg/dL — ABNORMAL LOW (ref 8.9–10.3)
Chloride: 101 mmol/L (ref 98–111)
Chloride: 101 mmol/L (ref 98–111)
Creatinine, Ser: 2.68 mg/dL — ABNORMAL HIGH (ref 0.44–1.00)
Creatinine, Ser: 2.63 mg/dL — ABNORMAL HIGH (ref 0.44–1.00)
GFR calc Af Amer: 19 mL/min — ABNORMAL LOW (ref >60)
GFR calc Af Amer: 19 mL/min — ABNORMAL LOW (ref >60)
GFR calc non Af Amer: 16 mL/min — ABNORMAL LOW (ref >60)
GFR calc non Af Amer: 17 mL/min — ABNORMAL LOW (ref >60)
Glucose, Bld: 211 mg/dL — ABNORMAL HIGH (ref 70–99)
Glucose, Bld: 164 mg/dL — ABNORMAL HIGH (ref 70–99)
Potassium: 4.1 mmol/L (ref 3.5–5.1)
Potassium: 4.3 mmol/L (ref 3.5–5.1)
Sodium: 138 mmol/L (ref 135–145)
Sodium: 138 mmol/L (ref 135–145)

## 2018-07-24 LAB — BLOOD GAS, ARTERIAL
Acid-Base Excess: 2 mmol/L (ref 0.0–2.0)
Acid-Base Excess: 1.8 mmol/L (ref 0.0–2.0)
Acid-Base Excess: 1.6 mmol/L (ref 0.0–2.0)
Acid-base deficit: 1.6 mmol/L (ref 0.0–2.0)
Bicarbonate: 21.7 mmol/L (ref 20.0–28.0)
Bicarbonate: 24.4 mmol/L (ref 20.0–28.0)
Bicarbonate: 25.7 mmol/L (ref 20.0–28.0)
Bicarbonate: 25.7 mmol/L (ref 20.0–28.0)
Drawn by: 317771
Drawn by: 27021
Drawn by: 270211
Drawn by: 27021
FIO2: 90
FIO2: 0.7
FIO2: 0.7
FIO2: 0.7
MECHVT: 400 mL
MECHVT: 400 mL
MECHVT: 400 mL
MECHVT: 400 mL
O2 Saturation: 94.1 %
O2 Saturation: 92.2 %
O2 Saturation: 92.8 %
O2 Saturation: 93.8 %
PEEP: 5 cmH2O
PEEP: 8 cmH2O
PEEP: 8 cmH2O
PEEP: 8 cmH2O
Patient temperature: 98.6
Patient temperature: 37
Patient temperature: 37
Patient temperature: 37
RATE: 30 resp/min
RATE: 30 resp/min
RATE: 20 resp/min
RATE: 20 resp/min
pCO2 arterial: 33.4 mmHg (ref 32.0–48.0)
pCO2 arterial: 30.8 mmHg — ABNORMAL LOW (ref 32.0–48.0)
pCO2 arterial: 39.6 mmHg (ref 32.0–48.0)
pCO2 arterial: 40.9 mmHg (ref 32.0–48.0)
pH, Arterial: 7.428 (ref 7.350–7.450)
pH, Arterial: 7.51 — ABNORMAL HIGH (ref 7.350–7.450)
pH, Arterial: 7.428 (ref 7.350–7.450)
pH, Arterial: 7.416 (ref 7.350–7.450)
pO2, Arterial: 69.2 mmHg — ABNORMAL LOW (ref 83.0–108.0)
pO2, Arterial: 58.9 mmHg — ABNORMAL LOW (ref 83.0–108.0)
pO2, Arterial: 66.4 mmHg — ABNORMAL LOW (ref 83.0–108.0)
pO2, Arterial: 71.2 mmHg — ABNORMAL LOW (ref 83.0–108.0)

## 2018-07-24 LAB — GLUCOSE, CAPILLARY
Glucose-Capillary: 142 mg/dL — ABNORMAL HIGH (ref 70–99)
Glucose-Capillary: 129 mg/dL — ABNORMAL HIGH (ref 70–99)
Glucose-Capillary: 123 mg/dL — ABNORMAL HIGH (ref 70–99)
Glucose-Capillary: 116 mg/dL — ABNORMAL HIGH (ref 70–99)
Glucose-Capillary: 142 mg/dL — ABNORMAL HIGH (ref 70–99)
Glucose-Capillary: 176 mg/dL — ABNORMAL HIGH (ref 70–99)

## 2018-07-24 LAB — URINE CULTURE: Culture: >=100000 — AB

## 2018-07-24 MED ORDER — TRAVASOL 10 % IV SOLN
INTRAVENOUS | Status: AC
  Administered 2018-07-24: 19:00:00 via INTRAVENOUS
  Filled 2018-07-24: qty 480

## 2018-07-24 MED ORDER — NOREPINEPHRINE BITARTRATE 1 MG/ML IV SOLN
0.0000 ug/min | INTRAVENOUS | Status: DC
  Administered 2018-07-24: 35 ug/min via INTRAVENOUS
  Filled 2018-07-24 (×2): qty 16

## 2018-07-24 MED ORDER — CALCIUM GLUCONATE-NACL 1-0.675 GM/50ML-% IV SOLN
1.0000 g | Freq: Once | INTRAVENOUS | Status: AC
  Administered 2018-07-24: 1000 mg via INTRAVENOUS
  Filled 2018-07-24: qty 50

## 2018-07-24 MED ORDER — NOREPINEPHRINE 16 MG/250ML-% IV SOLN
0.0000 ug/min | INTRAVENOUS | Status: DC
  Administered 2018-07-24: 12 ug/min via INTRAVENOUS
  Filled 2018-07-24: qty 250

## 2018-07-24 MED ORDER — STERILE WATER FOR INJECTION IV SOLN
INTRAVENOUS | Status: DC
  Administered 2018-07-25: via INTRAVENOUS
  Filled 2018-07-24: qty 850

## 2018-07-24 MED ORDER — ACETAMINOPHEN 160 MG/5ML PO SOLN
650.0000 mg | Freq: Four times a day (QID) | ORAL | Status: DC | PRN
  Administered 2018-07-26: 650 mg
  Filled 2018-07-24: qty 20.3

## 2018-07-24 MED ORDER — NOREPINEPHRINE 4 MG/250ML-% IV SOLN
0.0000 ug/min | INTRAVENOUS | Status: DC
  Filled 2018-07-24: qty 250

## 2018-07-24 NOTE — Progress Notes (Signed)
Nutrition Follow-up  DOCUMENTATION CODES:   Obesity unspecified  INTERVENTION:  - TPN initiation and advancement per Pharmacy.    NUTRITION DIAGNOSIS:   Inadequate oral intake related to inability to eat as evidenced by NPO status. -ongoing  GOAL:   Provide needs based on ASPEN/SCCM guidelines -unmet at this time   MONITOR:   Vent status, Labs, Weight trends, Skin, I & O's, Other (Comment)(TPN regimen)  ASSESSMENT:   78 y.o. female with medical history significant for HTN, hyperlipidemia, prior abdominal surgery d/t megacolon when she was 78 years old, hypothyroidism, type 2 DM, and rheumatoid arthritis. She presented to [facility] due to abdominal pain, N/V x1 day. Stated she has been experiencing generalized weakness and thought it was due to constipation which she was experiencing for the 3 days PTA. Had an enema with minimal hard stool output 6/8. Since then has been experiencing worsening abdominal cramping with nausea and vomiting x2.  Patient has not been weighed since 6/9. She remains intubated with OGT to LIS with scant amount of output this AM. Per flow sheet, 600 ml out during day shift yesterday. Patient is POD #1 total colectomy, ileostomy, cholecystectomy. 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 Able to communicate with Pharmacist via secure chat.   Per notes: septic shock 2/2 ischemic colitis and bacterial translocation s/p emergent total colectomy, UTI, metabolic acidosis, AKI, acute hypoxic respiratory failure with R>L airspace disease, AKI, hypocalcemia, mild thrombocytopenia, elevated lipase with CT unremarkable to for any pancreatic involvement.    Patient is currently intubated on ventilator support MV: 7.6 L/min Temp (24hrs), Avg:99.4 F (37.4 C), Min:98.1 F (36.7 C), Max:100.7 F (38.2 C) Propofol: none BP: 116/49 and MAP: 71  Medications reviewed; sliding scale novolog, 1 g Ca gluconate x1 run 6/10 and x1 run 6/11, 56 mcg IV  synthroid/day, 40 mg IV protonix BID.  Labs reviewed; CBGs: 142 and 129 mg/dl today, BUN: 55 mg/dl, creatinine: 2.68 mg/dl, Ca: 6.3 mg/dl, GFR: 16 ml/min. IVF; 150 mEq sodium bicarb in sterile water @ 75 ml/hr--decrease to 50 ml/hr with TPN start.  Drips; vaso @ 0.03 units/min, levo @ 18 mcg/min, fentanyl @ 150 mcg/hr.      NUTRITION - FOCUSED PHYSICAL EXAM:  completed; no muscle and no fat wasting.   Diet Order:   Diet Order            Diet NPO time specified  Diet effective now              EDUCATION NEEDS:   No education needs have been identified at this time  Skin:  Skin Assessment: Skin Integrity Issues: Skin Integrity Issues:: Incisions Incisions: abdomen (6/10)  Last BM:  6/5  Height:   Ht Readings from Last 1 Encounters:  07/23/18 5\' 2"  (1.575 m)    Weight:   Wt Readings from Last 1 Encounters:  07/22/18 90.7 kg    Ideal Body Weight:  50 kg  BMI:  Body mass index is 36.58 kg/m.  Estimated Nutritional Needs:   Kcal:  1000-1270 kcal  Protein:  >/= 100 grams  Fluid:  >/= 1.8 L/day      Jarome Matin, MS, RD, LDN, [facility] Inpatient Clinical Dietitian Pager # [phone] After hours/weekend pager # [phone]

## 2018-07-24 NOTE — Consult Note (Signed)
[place] Nurse ostomy consult note Stoma type/location: RUQ, end ileostomy Patient intubated but awake and nods head to [place] nurse appropriately  Stomal assessment/size: 11/4" visualized through pouch, dark but moist Peristomal assessment: NA Treatment options for stomal/peristomal skin: NA Output none Ostomy pouching: 1pc. In place from [place] provided:  Explained my role to the patient and that I would start to work with her when her ETT has been removed and she is more awake. She nods understanding.  Enrolled patient in [place] program: No  Stoma is in an abdominal crease. I have ordered flexible convexity and a belt for this reason. I have asked the unit secretary to place the supplies in the patient's room. Will plan to change her pouch tomorrow.   Patient has an open abdominal wound in close proximity to the skin barrier but enough space to off center next pouch.   [place] Nurse will follow along with you for continued support with ostomy teaching and care [place] MSN, RN, [place], [place], [place]

## 2018-07-24 NOTE — Progress Notes (Signed)
eLink Physician-Brief Progress Note Patient Name: Hailey Hughes DOB: 1941/01/10 MRN: [id number]   Date of Service  07/24/2018  HPI/Events of Note  Hypocalcemia - Ca++ = 6.3.   eICU Interventions  Will replace Ca++.     Intervention Category Major Interventions: Electrolyte abnormality - evaluation and management  Sommer,Steven Eugene 07/24/2018, 4:55 AM

## 2018-07-24 NOTE — Progress Notes (Addendum)
PHARMACY - ADULT TOTAL PARENTERAL NUTRITION CONSULT NOTE   Pharmacy Consult for TPN Indication: POD1 massive bowel resection  Patient Measurements: Height: 5\' 2"  (157.5 cm) Weight: 200 lb (90.7 kg) IBW/kg (Calculated) : 50.1 TPN AdjBW (KG): 60.3 Body mass index is 36.58 kg/m.  Insulin Requirements: n/a  Current Nutrition: NPO  IVF: Na Bicarb in sterile water at 52 ml/lhr  Central access: PICC TPN start date: 6/11  ASSESSMENT                                                                                                          HPI: 78 yo female POD1 total abdominal colectomy, brooke ileostomy and cholecystecomy to start TPN today. Note patient currently intubated on ventilator sedated on fentanyl drip as well as on pressors with NE drip. Patient in acute renal failure and with metabolic acidosis on bicarb drip  Significant events:   Today, 07/24/18  Glucose (goal CBGs < 150): currently at goal on SSI q4 - range 123-142  Electrolytes - WNL except Calcium low at 6.3 and phos was elevated at 5.1 last PM - patient has received 3g total of calcium gluconate since last night per Md orders  Renal - SCr 2.68 CrCl 18  LFTs - to check in AM  TGs - to check in AM  Prealbumin - to check in AM  NUTRITIONAL GOALS                                                                                             RD recs: pending  Custom TPN at a goal rate of 29ml/hr to provide: 90g/day protein, 1700Kcal/day.  PLAN                                                                                                                         At 1800 today:  Start Custom TPN at rate of 40 ml/hr  TPN will have standard concentrations of electrolytes except: NO phos, Ca at 10, Na at 75, and max acetate  Plan to advance as tolerated to the goal rate.  TPN to contain standard multivitamins daily and trace elements MWF only due to national shortage  Na bicarb in sterile water currently at 75  ml/hr per Md - reduce to 50 ml/hr when TPN starts  Continue SSI q4h  TPN lab panels on Mondays & Thursdays.   Hessie Knows, PharmD, BCPS 07/24/2018 11:27 AM

## 2018-07-24 NOTE — Progress Notes (Signed)
1 Day Post-Op   Subjective/Chief Complaint: Awake on the vent  Acidosis improved  On multiple pressors but BP much improved    Objective: Vital signs in last 24 hours: Temp:  [98.1 F (36.7 C)-99.8 F (37.7 C)] 99.8 F (37.7 C) (06/11 0730) Pulse Rate:  [76-100] 76 (06/11 0600) Resp:  [0-30] 30 (06/11 0600) SpO2:  [92 %-98 %] 95 % (06/11 0600) Arterial Line BP: (91-140)/(23-63) 132/36 (06/11 0600) FiO2 (%):  [90 %-100 %] 90 % (06/11 0320) Last BM Date: 07/18/18  Intake/Output from previous day: 06/10 0701 - 06/11 0700 In: 8189.1 [I.V.:7220.3; IV Piggyback:968.8] Out: 2675 [Urine:1375; Emesis/NG output:600; Drains:650; Blood:50] Intake/Output this shift: No intake/output data recorded.  General appearance: alert Incision/Wound:wound open and clean RLQ ostomy viable   Lab Results:  Recent Labs    07/22/18 2036 07/23/18 1217 07/24/18 0331  WBC 13.9*  --  8.0  HGB 11.6* 10.9* 10.3*  HCT 39.9 32.0* 32.4*  PLT 130*  --  127*   BMET Recent Labs    07/23/18 1938 07/24/18 0331  NA 141 138  K 4.0 4.1  CL 110 101  CO2 16* 20*  GLUCOSE 161* 211*  BUN 55* 55*  CREATININE 2.74* 2.68*  CALCIUM 5.9* 6.3*   PT/INR No results for input(s): LABPROT, INR in the last 72 hours. ABG Recent Labs    07/23/18 2023 07/24/18 0302  PHART 7.339* 7.428  HCO3 17.4* 21.7    Studies/Results: Dg Abd 1 View  Result Date: 07/23/2018 CLINICAL DATA:  Check gastric catheter placement EXAM: ABDOMEN - 1 VIEW COMPARISON:  None. FINDINGS: Gastric catheter is noted within the stomach. No free air is seen. Paucity of bowel gas is noted. IMPRESSION: Gastric catheter within the stomach. Electronically Signed   By: Inez Catalina [name]   On: 07/23/2018 08:25   Dg Chest Port 1 View  Result Date: 07/24/2018 CLINICAL DATA:  Follow-up left basilar atelectasis EXAM: PORTABLE CHEST 1 VIEW COMPARISON:  07/23/2018 FINDINGS: Endotracheal tube, left subclavian central line and gastric catheter are  noted. Significant increase in pleural fluid is noted bilaterally right greater than left. Mild underlying vascular congestion is seen as well as patchy left basilar infiltrate. IMPRESSION: Increasing effusions bilaterally. Tubes and lines as described above. Stable left basilar atelectasis. Electronically Signed   By: Inez Catalina [name]   On: 07/24/2018 08:08   Dg Chest Port 1 View  Result Date: 07/23/2018 CLINICAL DATA:  Respiratory failure EXAM: PORTABLE CHEST 1 VIEW COMPARISON:  07/23/2018 FINDINGS: Cardiac shadow is stable. Endotracheal tube, gastric catheter and left subclavian central line are noted. Patchy left basilar atelectasis is seen. No other focal abnormality is noted. Overall inspiratory effort is poor. IMPRESSION: Left basilar atelectasis. Tubes and lines as described. Electronically Signed   By: Inez Catalina [name]   On: 07/23/2018 08:26   Dg Chest Port 1 View  Result Date: 07/23/2018 CLINICAL DATA:  Sepsis EXAM: PORTABLE CHEST 1 VIEW COMPARISON:  Chest x-ray dated 05/06/2014. FINDINGS: The lung volumes are low. There is a left subclavian central venous catheter in place with tip projecting over the SVC. There is a left basilar airspace opacity. No pneumothorax. There may be trace bilateral pleural effusions. There is no acute osseous abnormality. IMPRESSION: 1. Well-positioned left subclavian central venous catheter without evidence of a pneumothorax. 2. Left basilar airspace opacity concerning for pneumonia. 3. Low lung volumes. Electronically Signed   By: Constance Holster [name]   On: 07/23/2018 03:44    Anti-infectives: Anti-infectives (From admission,  onward)   Start     Dose/Rate Route Frequency Ordered Stop   07/23/18 0200  piperacillin-tazobactam (ZOSYN) IVPB 2.25 g     2.25 g 100 mL/hr over 30 Minutes Intravenous Every 6 hours 07/22/18 2248     07/22/18 1800  piperacillin-tazobactam (ZOSYN) IVPB 3.375 g  Status:  Discontinued     3.375 g 12.5 mL/hr over 240 Minutes  Intravenous Every 8 hours 07/22/18 0953 07/22/18 2248   07/22/18 1000  piperacillin-tazobactam (ZOSYN) IVPB 3.375 g     3.375 g 100 mL/hr over 30 Minutes Intravenous  Once 07/22/18 0953 07/22/18 1239   07/22/18 0745  ciprofloxacin (CIPRO) IVPB 400 mg     400 mg 200 mL/hr over 60 Minutes Intravenous  Once 07/22/18 0739 07/22/18 0921   07/22/18 0745  metroNIDAZOLE (FLAGYL) IVPB 500 mg     500 mg 100 mL/hr over 60 Minutes Intravenous  Once 07/22/18 0739 07/22/18 1024      Assessment/Plan: s/p Procedure(s): TOTAL ABDOMINAL COLECTOMY, [name], CHOLECYSTECTOMY (N/A) Start TNA  ACIDOSIS improved  Continue supportive care for per CCM WOUND CARE/OSTOMY CARE   LOS: 2 days    Hailey Hughes 07/24/2018

## 2018-07-24 NOTE — Progress Notes (Signed)
CRITICAL VALUE ALERT  Critical Value:  Calcium 6.3  Date & Time Notied:  07/24/18 0445  Provider Notified: E-link  Orders Received/Actions taken: no new orders at this time

## 2018-07-24 NOTE — Progress Notes (Addendum)
NAME:  Hailey Hughes, MRN:  [id number], DOB:  07-Feb-1941, LOS: 2 ADMISSION DATE:  07/22/2018, CONSULTATION DATE: 07/22/2018 REFERRING MD: Francia Greaves MD, CHIEF COMPLAINT: Colitis  Brief History   Patient is an 78 year old white female admitted the hospital 4/9 with CT scan evidence of colitis transferred to the ICU that evening due to worsening hypotension  Past Medical History  Prior multiple abdominal surgeries, hypothyroidism, type 2 diabetes rheumatoid arthritis, obstructive sleep apnea  Significant Hospital Events   Admission 07/22/2018 transferred to ICU for hypotension 6/10: Received multiple boluses of IV crystalloid remained hypotensive requiring phenylephrine infusion.  Started on vasopressin on top of phenylephrine, central line placed.  Patient did have episode of vomiting.  Developed worsening work of breathing, desaturation, and worsening metabolic acidosis Ultimately required intubation.  Lactic acid initially slightly improved from peak of 5.4 down to 3.5. having exquisite abdominal pain, surgical services consulted->went for emergent exploratory lap, found to have ischemic colitis and possibly ischemic GB she underwent total colectomy w/ ileostomy placement as well as cholecystectomy. She returned to the ICU w/ three vasoactive gtts and bicarb gtt doubled. PCCM asked to resume care.  6/11: Acid-base improving, vasoactive drip requirements decreased, chest x-ray worse with worsening airspace disease and right effusion, decreasing IV fluid rate PEEP adjusted up to facilitate weaning FiO2 starting TNA Consults:  Critical care 6/9 General surgery consulted 6/10 Procedures:  Intubation 6/10 CVL left subclavian 6/10 Arterial line 6/10 Significant Diagnostic Tests:  NA  Micro Data:  MRSA screen 6/9: neg BCX2 6/9>>> COVID-19 6/9: neg Urine culture 6/9: Klebsiella pneumoniae>>> Antimicrobials:  Zosyn 6/9>>>  Interim history/subjective:  No distress.  No major issues overnight  Objective   Blood pressure 121/60, pulse 76, temperature 99.8 F (37.7 C), temperature source Axillary, resp. rate (Abnormal) 30, height 5\' 2"  (1.575 m), weight 90.7 kg, SpO2 95 %. CVP:  [4 mmHg-13 mmHg] 4 mmHg  Vent Mode: PRVC FiO2 (%):  [90 %-100 %] 90 % Set Rate:  [30 bmp] 30 bmp Vt Set:  [400 mL] 400 mL PEEP:  [5 cmH20] 5 cmH20 Plateau Pressure:  [18 cmH20-23 cmH20] 20 cmH20   Intake/Output Summary (Last 24 hours) at 07/24/2018 7673 Last data filed at 07/24/2018 4193 Gross per 24 hour  Intake 8189.08 ml  Output 2675 ml  Net 5514.08 ml   Filed Weights   07/22/18 0148  Weight: 90.7 kg    Examination:  General: This 78 year old white female she is resting comfortably on the vent she is in no acute distress this morning HEENT normocephalic atraumatic no jugular venous distention orally intubated mucous membranes moist Pulmonary: Diminished bases no accessory use she is on 80% FiO2 and 5 of PEEP, we have adjusted these ventilator settings Cardiac: Regular rate and rhythm without murmur rub or gallop Abdomen: Soft, less tender.  Mid abdominal wound dressing intact.  Ileostomy site beefy minimal stool GU: Clear yellow Neuro: Awake arousable follows commands Extremities: Warm and dry mild generalized edema this is increased in comparison to yesterday   Resolved Hospital Problem list   Hyperkalemia  Assessment & Plan:   Septic shock 2/2 ischemic colitis and bacterial translocation. Now s/p emergent total colectomy w/ placement of ileostomy and cholecystectomy on 6/10 Plan  Day #4 Zosyn  Mean arterial pressure goal greater than 65, CVP goal 8-12  Follow-up cultures Continuing IV fluids Weaning norepinephrine, once less than 50 mics will discontinue vasopressin Start TPN Further surgical recs specifically regarding wound care per surgical services  Klebsiella UTI Plan Await  culture sensitivities, continue Zosyn for now  Anion gap and non-anion gap metabolic acidosis in  the setting of lactic acidosis as well as AKI  -Renal function improving, acid-base also appears to be improving Plan Continuing to trend serial chemistries Repeat arterial blood gas in a.m. Decreasing sodium bicarbonate to 75 cc an hour  Acute hypoxic respiratory failure.  Cannot exclude element of aspiration pneumonia Portable chest x-ray personally reviewed: This is showing right greater than left airspace disease this appears to be an element of what is likely sympathetic pleural effusion particularly on the right, and pulmonary edema versus infiltrate  plan Continuing full ventilator support Have increased PEEP to facilitate weaning FiO2 Keep euvolemic, once off pressors would benefit from diuresis VAP bundle A.m. chest x-ray PAD protocol RA SS goal -1  Acute kidney injury: Baseline creatinine approximately 1.3 current creatinine Serum creatinine peaked at 3.04, this is now slowly trending down Plan Continue to trend chemistry  Strict intake and output  Holding NSAIDs, antihypertensives and diuretics  Ensure adequate CVP and mean arterial pressure  Renal dose medications   Fluid and electrolyte imbalance: Hypocalcemia Replaced Plan Check ionized calcium in a.m.  Mild thrombocytopenia in the setting of sepsis Plan A.m. CBC  Type 2 diabetes mellitus with hyperglycemia Excellent glycemic control  plan Sliding scale insulin  Hypothyroidism  Plan Cont synthroid   Elevated lipase, pancreas unremarkable on CT imaging Plan Repeat lipase    Best practice:  Diet: h20 Pain/Anxiety/Delirium protocol (if indicated): na VAP protocol (if indicated): na DVT prophylaxis: scd GI prophylaxis: protonix Glucose control: sliding scale Mobility: bed Code Status: full Family Communication: I updated the son Onalee Hua yesterday, will reach out again for him once again today Disposition:now s/p exploratory lap w/ total colectomy and cholecystectomy and placement of ileostomy.  She  seems to be stabilizing.  All laboratory indices suggest some mild improvement.  Pleased to see her pressor requirements are improved.  Her x-ray does look a little worse, I think this represents what is likely a sympathetic right effusion, and also possibly some aspiration from vomiting episode.  Primary goals for today are as follows: 1.  Wean FiO2, have increased PEEP to 8 to help facilitate this. 2.  Wean norepinephrine, once this is less than 50 mics per minute we will discontinue vasopressin 3.  Continue to watch fluid and electrolyte as well as acid-base balance, decreasing bicarbonate infusion to 75 cc an hour, ideally would like to keep her euvolemic at this point once she is off pressors she will need diuresis 4.  Continue current antibiotics 5.  Start TNA  Critical care time 35 minutes    Simonne Martinet ACNP-BC [facility] Pulmonary/Critical Care Pager # [phone] OR # [phone] if no answer

## 2018-07-25 ENCOUNTER — Inpatient Hospital Stay (HOSPITAL_COMMUNITY): Payer: Medicare Other

## 2018-07-25 LAB — DIFFERENTIAL
Abs Immature Granulocytes: 0.23 10*3/uL — ABNORMAL HIGH (ref 0.00–0.07)
Basophils Absolute: 0.1 10*3/uL (ref 0.0–0.1)
Basophils Relative: 1 %
Eosinophils Absolute: 0.1 10*3/uL (ref 0.0–0.5)
Eosinophils Relative: 1 %
Immature Granulocytes: 3 %
Lymphocytes Relative: 6 %
Lymphs Abs: 0.5 10*3/uL — ABNORMAL LOW (ref 0.7–4.0)
Monocytes Absolute: 0.8 10*3/uL (ref 0.1–1.0)
Monocytes Relative: 8 %
Neutro Abs: 7.5 10*3/uL (ref 1.7–7.7)
Neutrophils Relative %: 81 %

## 2018-07-25 LAB — BLOOD GAS, ARTERIAL
Acid-Base Excess: 4.7 mmol/L — ABNORMAL HIGH (ref 0.0–2.0)
Acid-Base Excess: 7.7 mmol/L — ABNORMAL HIGH (ref 0.0–2.0)
Bicarbonate: 28.9 mmol/L — ABNORMAL HIGH (ref 20.0–28.0)
Bicarbonate: 32.3 mmol/L — ABNORMAL HIGH (ref 20.0–28.0)
Drawn by: 235321
Drawn by: 270211
FIO2: 70
FIO2: 0.6
MECHVT: 400 mL
MECHVT: 400 mL
O2 Saturation: 93.1 %
O2 Saturation: 92.1 %
PEEP: 8 cmH2O
PEEP: 8 cmH2O
Patient temperature: 37
Patient temperature: 37
RATE: 20 resp/min
RATE: 14 resp/min
pCO2 arterial: 43.6 mmHg (ref 32.0–48.0)
pCO2 arterial: 48.2 mmHg — ABNORMAL HIGH (ref 32.0–48.0)
pH, Arterial: 7.437 (ref 7.350–7.450)
pH, Arterial: 7.442 (ref 7.350–7.450)
pO2, Arterial: 65.9 mmHg — ABNORMAL LOW (ref 83.0–108.0)
pO2, Arterial: 61.8 mmHg — ABNORMAL LOW (ref 83.0–108.0)

## 2018-07-25 LAB — CBC
HCT: 29.1 % — ABNORMAL LOW (ref 36.0–46.0)
Hemoglobin: 9.3 g/dL — ABNORMAL LOW (ref 12.0–15.0)
MCH: 29.2 pg (ref 26.0–34.0)
MCHC: 32 g/dL (ref 30.0–36.0)
MCV: 91.5 fL (ref 80.0–100.0)
Platelets: 116 10*3/uL — ABNORMAL LOW (ref 150–400)
RBC: 3.18 MIL/uL — ABNORMAL LOW (ref 3.87–5.11)
RDW: 15.9 % — ABNORMAL HIGH (ref 11.5–15.5)
WBC: 9.2 10*3/uL (ref 4.0–10.5)
nRBC: 0 % (ref 0.0–0.2)

## 2018-07-25 LAB — COMPREHENSIVE METABOLIC PANEL
ALT: 33 U/L (ref 0–44)
AST: 42 U/L — ABNORMAL HIGH (ref 15–41)
Albumin: 1.9 g/dL — ABNORMAL LOW (ref 3.5–5.0)
Alkaline Phosphatase: 60 U/L (ref 38–126)
Anion gap: 14 (ref 5–15)
BUN: 48 mg/dL — ABNORMAL HIGH (ref 8–23)
CO2: 27 mmol/L (ref 22–32)
Calcium: 7 mg/dL — ABNORMAL LOW (ref 8.9–10.3)
Chloride: 98 mmol/L (ref 98–111)
Creatinine, Ser: 2.58 mg/dL — ABNORMAL HIGH (ref 0.44–1.00)
GFR calc Af Amer: 20 mL/min — ABNORMAL LOW (ref >60)
GFR calc non Af Amer: 17 mL/min — ABNORMAL LOW (ref >60)
Glucose, Bld: 191 mg/dL — ABNORMAL HIGH (ref 70–99)
Potassium: 4 mmol/L (ref 3.5–5.1)
Sodium: 139 mmol/L (ref 135–145)
Total Bilirubin: 0.9 mg/dL (ref 0.3–1.2)
Total Protein: 4.1 g/dL — ABNORMAL LOW (ref 6.5–8.1)

## 2018-07-25 LAB — LIPASE, BLOOD: Lipase: 12 U/L (ref 11–51)

## 2018-07-25 LAB — PHOSPHORUS: Phosphorus: 3.6 mg/dL (ref 2.5–4.6)

## 2018-07-25 LAB — GLUCOSE, CAPILLARY
Glucose-Capillary: 188 mg/dL — ABNORMAL HIGH (ref 70–99)
Glucose-Capillary: 173 mg/dL — ABNORMAL HIGH (ref 70–99)
Glucose-Capillary: 142 mg/dL — ABNORMAL HIGH (ref 70–99)
Glucose-Capillary: 148 mg/dL — ABNORMAL HIGH (ref 70–99)
Glucose-Capillary: 170 mg/dL — ABNORMAL HIGH (ref 70–99)

## 2018-07-25 LAB — MAGNESIUM: Magnesium: 1.6 mg/dL — ABNORMAL LOW (ref 1.7–2.4)

## 2018-07-25 LAB — TRIGLYCERIDES: Triglycerides: 225 mg/dL — ABNORMAL HIGH (ref <150)

## 2018-07-25 LAB — PREALBUMIN: Prealbumin: 5 mg/dL — ABNORMAL LOW (ref 18–38)

## 2018-07-25 MED ORDER — POTASSIUM CHLORIDE 10 MEQ/100ML IV SOLN
10.0000 meq | INTRAVENOUS | Status: AC
  Administered 2018-07-25 (×4): 10 meq via INTRAVENOUS
  Filled 2018-07-25 (×4): qty 100

## 2018-07-25 MED ORDER — MAGNESIUM SULFATE 4 GM/100ML IV SOLN
4.0000 g | Freq: Once | INTRAVENOUS | Status: AC
  Administered 2018-07-25: 4 g via INTRAVENOUS
  Filled 2018-07-25: qty 100

## 2018-07-25 MED ORDER — FENTANYL CITRATE (PF) 100 MCG/2ML IJ SOLN
25.0000 ug | INTRAMUSCULAR | Status: DC | PRN
  Administered 2018-07-25: 100 ug via INTRAVENOUS
  Administered 2018-07-26 (×2): 50 ug via INTRAVENOUS
  Administered 2018-07-26 – 2018-07-27 (×5): 100 ug via INTRAVENOUS
  Administered 2018-07-27: 50 ug via INTRAVENOUS
  Filled 2018-07-25 (×9): qty 2

## 2018-07-25 MED ORDER — FENTANYL CITRATE (PF) 100 MCG/2ML IJ SOLN: 25.0000 ug | INTRAMUSCULAR | Status: DC | PRN

## 2018-07-25 MED ORDER — FUROSEMIDE 10 MG/ML IJ SOLN
40.0000 mg | Freq: Once | INTRAMUSCULAR | Status: AC
  Administered 2018-07-25: 40 mg via INTRAVENOUS
  Filled 2018-07-25: qty 4

## 2018-07-25 MED ORDER — TRAVASOL 10 % IV SOLN
INTRAVENOUS | Status: AC
  Administered 2018-07-25: 17:00:00 via INTRAVENOUS
  Filled 2018-07-25: qty 828

## 2018-07-25 NOTE — Progress Notes (Signed)
PHARMACY - ADULT TOTAL PARENTERAL NUTRITION CONSULT NOTE   Pharmacy Consult for TPN Indication: POD2 massive bowel resection  Patient Measurements: Height: 5\' 2"  (157.5 cm) Weight: 239 lb 3.2 oz (108.5 kg) IBW/kg (Calculated) : 50.1 TPN AdjBW (KG): 60.3 Body mass index is 43.75 kg/m.  Insulin Requirements: 11 units SSI since TPN started  Current Nutrition: NPO  IVF: none  Central access: PICC TPN start date: 6/11  ASSESSMENT                                                                                                          HPI: 78 yo female POD1 total abdominal colectomy, brooke ileostomy and cholecystecomy to start TPN today. Note patient currently intubated on ventilator sedated on fentanyl drip as well as on pressors with NE drip. Patient in acute renal failure and with metabolic acidosis on bicarb drip  Significant events:   Today, 07/25/18  Glucose (goal CBGs < 150): since TPN started - 142, 176, 188, 173  Electrolytes - WNL including CorrCa 8.7 WNL, except mag 1.6 - mag 4g IV x 1 already ordered  Renal - SCr 2.58 CrCl 20, slowly trending down  LFTs - AST/ALT 42/33  TGs - 225 (6/12)  Prealbumin - 5 (6/12)  NUTRITIONAL GOALS                                                                                             RD recs: 100g protein/day, 1000-1270 kcal/day - high protein, hypocaloric formula  Custom TPN (high protein, hypocaloric formula) at a new goal rate of 32ml/hr to provide  99g/day protein and 1175 kcal/day.  PLAN                                                                                                                         At 1800 today:  Continue custom TPN but changing formula per RD to high protein hypocaloric formula due to guidelines for critically obese ICU patient - see above for new goals. Will increase rate from 40 ml/lhr to 50 ml/hr with new bag this evening along with increased protein to 69g/L, decrease dextrose to 10%, and  decrease lipid ot  20g/L  TPN will have standard concentrations of electrolytes except: NO phos, Ca at 10, Na at 75, and max acetate  Plan to advance as tolerated to the goal rate.  TPN to contain standard multivitamins daily and trace elements MWF only due to national shortage  IVF's d/c'd per Md  Continue SSI q4h - note that CBGs should decrease due to reduced amount of dextrose in TPN formula  TPN lab panels on Mondays & Thursdays.   Hessie Knows, PharmD, BCPS 07/25/2018 9:04 AM

## 2018-07-25 NOTE — Progress Notes (Signed)
Pharmacy Antibiotic Note  Hailey Hughes is a 78 y.o. female with hx colon resection for megacolon presented to the ED on 07/22/2018 with c/o abd pain. Abd CT on 6/9 showed findings with suspicion for colitis.  To start zosyn for intra abdominal infection and now kleb pneumo UTI  Today, 07/25/18  Day 4 Zosyn  SCr slowly trending down - 2.58 CrCl 20  WBC WNL  Tmax 100.7   Plan:  Continue Zosyn 2.25g IV q6 for now with CrCl right at cutoff for dosing changes  Would suggest narrow abx's if possible, especially with urine culture results finalized _________________________________  Height: 5\' 2"  (157.5 cm) Weight: 239 lb 3.2 oz (108.5 kg) IBW/kg (Calculated) : 50.1  Temp (24hrs), Avg:100.2 F (37.9 C), Min:99.5 F (37.5 C), Max:100.7 F (38.2 C)  Recent Labs  Lab 07/22/18 0247  07/22/18 1802 07/22/18 2036 07/23/18 0057 07/23/18 1013  07/23/18 1603 07/23/18 1938 07/24/18 0331 07/24/18 1700 07/25/18 0500  WBC 17.6*  --   --  13.9*  --   --   --   --   --  8.0  --  9.2  CREATININE 2.01*  --   --  2.66*  --   --    < > 2.79* 2.74* 2.68* 2.63* 2.58*  LATICACIDVEN 5.0*   < > 5.4* 4.2* 3.5* 4.9*  --  5.6*  --   --   --   --    < > = values in this interval not displayed.    Estimated Creatinine Clearance: 20.2 mL/min (A) (by C-G formula based on SCr of 2.58 mg/dL (H)).    Allergies  Allergen Reactions  . Bee Venom Anaphylaxis  . Epinephrine Other (See Comments)    REACTION: Nervousness    Antimicrobials this admission:  6/9 Cipro/flagyl x1 6/9 zosyn>>  Dose adjustments this admission:  6/9: Zosyn EI > 2.25gm q6  Microbiology results:  6/9 BCx x1set: ngtd 6/9 UCx: kleb pneumo 6/9 Covid: neg  6/9 MRSA PCR: neg   Thank you for allowing pharmacy to be a part of this patient's care.  Adrian Saran, PharmD, BCPS 07/25/2018 9:21 AM

## 2018-07-25 NOTE — Progress Notes (Signed)
Pt's Arterial line malpositioned, RN to remove as pt is off pressors.  RT to follow up with need PRN.

## 2018-07-25 NOTE — Progress Notes (Signed)
NAME:  Hailey Hughes, MRN:  [id number], DOB:  [birth year], LOS: 3 ADMISSION DATE:  07/22/2018, CONSULTATION DATE: 07/22/2018 REFERRING MD: Raphael Gibney MD, CHIEF COMPLAINT: Colitis  Brief History   Patient is an 78 year old white female admitted the hospital 4/9 with CT scan evidence of colitis transferred to the ICU that evening due to worsening hypotension  Past Medical History  Prior multiple abdominal surgeries, hypothyroidism, type 2 diabetes rheumatoid arthritis, obstructive sleep apnea  Significant Hospital Events   Admission 07/22/2018 transferred to ICU for hypotension 6/10: Received multiple boluses of IV crystalloid remained hypotensive requiring phenylephrine infusion.  Started on vasopressin on top of phenylephrine, central line placed.  Patient did have episode of vomiting.  Developed worsening work of breathing, desaturation, and worsening metabolic acidosis Ultimately required intubation.  Lactic acid initially slightly improved from peak of 5.4 down to 3.5. having exquisite abdominal pain, surgical services consulted->went for emergent exploratory lap, found to have ischemic colitis and possibly ischemic GB she underwent total colectomy w/ ileostomy placement as well as cholecystectomy. She returned to the ICU w/ three vasoactive gtts and bicarb gtt doubled. PCCM asked to resume care.  6/11: Acid-base improving, vasoactive drip requirements decreased, chest x-ray worse with worsening airspace disease and right effusion, decreasing IV fluid rate PEEP adjusted up to facilitate weaning FiO2 starting TNA 6/12.  Vasopressin off.  Weaning norepinephrine to off.  Starting diuresis.  Continuing antibiotics, TNA, and supportive care.  Discontinuing bicarbonate infusion.  Overall improving working towards pressure support ventilation Consults:  Critical care 6/9 General surgery consulted 6/10 Procedures:  Intubation 6/10 CVL left subclavian 6/10 Arterial line 6/10 Significant Diagnostic  Tests:  NA  Micro Data:  MRSA screen 6/9: neg BCX2 6/9>>> COVID-19 6/9: neg Urine culture 6/9: Klebsiella pneumoniae and sensitive Antimicrobials:  Zosyn 6/9>>>  Interim history/subjective:  No issues overnight, continues to improve Objective   Blood pressure (Abnormal) 139/48, pulse 92, temperature 100.1 F (37.8 C), temperature source Axillary, resp. rate 18, height 5\' 2"  (1.575 m), weight 108.5 kg, SpO2 90 %. CVP:  [7 mmHg-10 mmHg] 10 mmHg  Vent Mode: PRVC FiO2 (%):  [40 %-70 %] 40 % Set Rate:  [14 bmp-20 bmp] 14 bmp Vt Set:  [400 mL] 400 mL PEEP:  [8 cmH20] 8 cmH20 Plateau Pressure:  [21 cmH20-22 cmH20] 22 cmH20   Intake/Output Summary (Last 24 hours) at 07/25/2018 0827 Last data filed at 07/25/2018 0600 Gross per 24 hour  Intake 2780.76 ml  Output 2140 ml  Net 640.76 ml   Filed Weights   07/22/18 0148 07/24/18 1132  Weight: 90.7 kg 108.5 kg    Examination:  General this is a pleasant 78 year old white female she is resting comfortably on the mechanical ventilator she is in no acute distress but does have expected postoperative discomfort HEENT normocephalic atraumatic no jugular venous distention she is orally intubated Pulmonary: Some coarse scattered rhonchi no accessory use.  On pressure support ventilation she can pull 500 cc tidal volumes on a pressure support of 5 however she does desaturate on this setting Cardiac: Regular rate and rhythm without murmur rub or gallop Abdomen: Soft, not tender.  Hypoactive bowel sounds.  The ileostomy is beefy red, the mid abdominal dressing is intact Extremities: Warm and dry, has dependent edema and scattered areas of ecchymosis. Neuro: Awake, interactive, appropriate, moves all extremities no focal deficits are appreciated GU: Concentrated yellow urine.   Resolved Hospital Problem list   Hyperkalemia Elevated Lipase (normalized 6/12) Non-gap and non-anion gap metabolic acidosis  Lactic acidosis  Assessment & Plan:    Septic shock 2/2 ischemic colitis and bacterial translocation. Now s/p emergent total colectomy w/ placement of ileostomy and cholecystectomy on 6/10 Vasopressin is off Weaning norepinephrine Looks better Plan  Day #5 Zosyn, will plan on total of 8 days Continue to wean norepinephrine for mean arterial pressure greater than 65 KVO IV fluids TPN Routine wound care  Klebsiella UTI (pansensitive) Plan Zosyn, per above   Acute hypoxic respiratory failure.  Cannot exclude element of aspiration pneumonia -Portable chest x-ray personally reviewed: Endotracheal tubes in satisfactory position as is the left subclavian central line.  There continues to be right greater than left bilateral effusions and atelectasis.  Aeration may be a little worse when comparing films from day prior -Weaning PEEP and FiO2, she did desaturate when PEEP was dropped to 5 plan Continuing full ventilator support: May attempt pressure support ventilation a little later today Have decreased RR 14 Continue to wean FiO2, will keep PEEP at 8 today  We will give IV Lasix once we are off pressors, I suspect this will be later this morning  Discontinue sedating drips, changed to PRN  VAP bundle  PAD protocol, RAS goal 0 to -1  A.m. chest x-ray   Acute kidney injury: Baseline creatinine approximately 1.3 current creatinine Serum creatinine peaked at 3.04, is continuing to improve Plan KVO IV fluids with exception of TNA  Holding NSAIDs and antihypertensives  Strict intake and output  Renal dose medications  Ensure adequate mean arterial pressure  A.m. chemistry   Fluid and electrolyte imbalance: Hypocalcemia mild hypomagnesemia Replaced Plan Awaiting ionized calcium Replace magnesium  Mild thrombocytopenia in the setting of sepsis Stable for last 24 hours Plan A.m. CBC  Type 2 diabetes mellitus with hyperglycemia Excellent glycemic control  plan Sliding scale insulin  Hypothyroidism  Plan Cont  synthroid     Best practice:  Diet: NPO Pain/Anxiety/Delirium protocol (if indicated): na VAP protocol (if indicated): na DVT prophylaxis: scd GI prophylaxis: protonix Glucose control: sliding scale Mobility: bed Code Status: full Family Communication: I updated the son Shanon Brow yesterday, will reach out again for him once again today Disposition:now s/p exploratory lap w/ total colectomy and cholecystectomy and placement of ileostomy.  She is continuing to improve.  Acid-base is normalized, white blood cell count normalized, platelet count stable, hemodynamics improving she is now off vasopressin and weaning norepinephrine.  She not ready to come off the ventilator as of yet, still desaturates when we drop the PEEP down to 5, she needs volume removal she has large pleural effusions right greater than left.  I remain hopeful however, I think if she continues as she is we can probably initiate pressure support ventilation later this afternoon and hopefully get her extubated this weekend  Critical care time 35 minutes    Erick Colace ACNP-BC [place] Pager # [phone] OR # [phone] if no answer

## 2018-07-25 NOTE — Progress Notes (Signed)
Wasted 130 mLs of fentanyl with Mckenzie P. RN

## 2018-07-25 NOTE — Progress Notes (Addendum)
[facility] Progress Note  2 Days Post-Op  Subjective: CC-  Awake on the vent. Weaning pressor support. CCM hopeful for extubation tomorrow. No acute events over night. No output from ostomy.  Objective: Vital signs in last 24 hours: Temp:  [99.5 F (37.5 C)-100.7 F (38.2 C)] 100.1 F (37.8 C) (06/12 0740) Pulse Rate:  [87-101] 92 (06/12 0738) Resp:  [0-20] 18 (06/12 0738) BP: (120-139)/(40-97) 139/48 (06/12 0700) SpO2:  [90 %-95 %] 90 % (06/12 0808) Arterial Line BP: (103-150)/(13-51) 123/45 (06/12 0745) FiO2 (%):  [40 %-70 %] 40 % (06/12 0808) Weight:  [108.5 kg] 108.5 kg (06/11 1132) Last BM Date: (unknown)  Intake/Output from previous day: 06/11 0701 - 06/12 0700 In: 2780.8 [I.V.:2594.5; IV Piggyback:186.3] Out: 2175 [Urine:1900; Drains:275] Intake/Output this shift: No intake/output data recorded.  PE: Gen:  Awake on the vent, NAD Neuro: follows commands HEENT: EOM's intact, pupils equal and round, ETT in place Card:  RRR Pulm:  No wheezing or rhonchi, on vent Skin: warm and dry Msk: mild generalized edema Abd: obese, soft, hypoactive BS, open midline light pink with no erythema or drainage, JP drain serosanguinous, ostomy pink with no air or stool in pouch     Lab Results:  Recent Labs    07/24/18 0331 07/25/18 0500  WBC 8.0 9.2  HGB 10.3* 9.3*  HCT 32.4* 29.1*  PLT 127* 116*   BMET Recent Labs    07/24/18 1700 07/25/18 0500  NA 138 139  K 4.3 4.0  CL 101 98  CO2 27 27  GLUCOSE 164* 191*  BUN 48* 48*  CREATININE 2.63* 2.58*  CALCIUM 6.8* 7.0*   PT/INR No results for input(s): LABPROT, INR in the last 72 hours. CMP     Component Value Date/Time   NA 139 07/25/2018 0500   K 4.0 07/25/2018 0500   CL 98 07/25/2018 0500   CO2 27 07/25/2018 0500   GLUCOSE 191 (H) 07/25/2018 0500   BUN 48 (H) 07/25/2018 0500   CREATININE 2.58 (H) 07/25/2018 0500   CALCIUM 7.0 (L) 07/25/2018 0500   PROT 4.1 (L) 07/25/2018 0500   ALBUMIN  1.9 (L) 07/25/2018 0500   AST 42 (H) 07/25/2018 0500   ALT 33 07/25/2018 0500   ALKPHOS 60 07/25/2018 0500   BILITOT 0.9 07/25/2018 0500   GFRNONAA 17 (L) 07/25/2018 0500   GFRAA 20 (L) 07/25/2018 0500   Lipase     Component Value Date/Time   LIPASE 12 07/25/2018 0500       Studies/Results: Dg Chest Port 1 View  Result Date: 07/25/2018 CLINICAL DATA:  Respiratory failure EXAM: PORTABLE CHEST 1 VIEW COMPARISON:  07/24/2018 FINDINGS: Cardiac shadow is stable. Endotracheal tube and gastric catheter as well as a left subclavian central line are again noted and stable. Central vascular congestion is noted with evidence of bilateral pleural effusions and bibasilar atelectasis. No new focal infiltrate is seen. IMPRESSION: Relatively stable appearance of the chest when compared with the previous day. Electronically Signed   By: Alcide Clever [name]   On: 07/25/2018 08:00   Dg Chest Port 1 View  Result Date: 07/24/2018 CLINICAL DATA:  Follow-up left basilar atelectasis EXAM: PORTABLE CHEST 1 VIEW COMPARISON:  07/23/2018 FINDINGS: Endotracheal tube, left subclavian central line and gastric catheter are noted. Significant increase in pleural fluid is noted bilaterally right greater than left. Mild underlying vascular congestion is seen as well as patchy left basilar infiltrate. IMPRESSION: Increasing effusions bilaterally. Tubes and lines as described above. Stable left  basilar atelectasis. Electronically Signed   By: Inez Catalina [name]   On: 07/24/2018 08:08    Anti-infectives: Anti-infectives (From admission, onward)   Start     Dose/Rate Route Frequency Ordered Stop   07/23/18 0200  piperacillin-tazobactam (ZOSYN) IVPB 2.25 g     2.25 g 100 mL/hr over 30 Minutes Intravenous Every 6 hours 07/22/18 2248     07/22/18 1800  piperacillin-tazobactam (ZOSYN) IVPB 3.375 g  Status:  Discontinued     3.375 g 12.5 mL/hr over 240 Minutes Intravenous Every 8 hours 07/22/18 0953 07/22/18 2248   07/22/18  1000  piperacillin-tazobactam (ZOSYN) IVPB 3.375 g     3.375 g 100 mL/hr over 30 Minutes Intravenous  Once 07/22/18 0953 07/22/18 1239   07/22/18 0745  ciprofloxacin (CIPRO) IVPB 400 mg     400 mg 200 mL/hr over 60 Minutes Intravenous  Once 07/22/18 0739 07/22/18 0921   07/22/18 0745  metroNIDAZOLE (FLAGYL) IVPB 500 mg     500 mg 100 mL/hr over 60 Minutes Intravenous  Once 07/22/18 0739 07/22/18 1024     Assessment/Plan Type 2 diabetes Hypothyroid Rheumatoid arthritis Struct of sleep apnea Morbid obesity BMI 36.58 AKI - Cr trending down 2.58, good UOP Malnutrition - prealbumin 5, on TPN Klebsiella UTI  Septic shock Intubated. Ischemic colitis, acute cholecystitis S/p Exploratory laparotomy, total abdominal colectomy, [name], cholecystectomy - 6/10 - Dr. Dalbert Batman  - POD 2  - await bowel function, on TPN  - WOC RN consult for new ostomy  - will ask WOC to place abdominal wound vac today  ID - zosyn 6/9>> FEN - IVF, TPN, NPO VTE - SCDs, ok for sq heparin or lovenox from surgical standpoint Foley - in place Follow up - Dr. Adline Peals - son Shanon Brow [phone], husband Hoy Morn [phone]  Plan - Appreciate CCM assistance, weaning pressor support today and hopeful for extubation tomorrow.    LOS: 3 days   Wellington Hampshire , [facility] 07/25/2018, 8:29 AM Pager: [phone]  Agree with above. Seems to be progressing, but remains intubated at this time.  Alphonsa Overall, MD, [facility] Pager: [phone] Office phone:  [phone]

## 2018-07-25 NOTE — Consult Note (Signed)
[place] Nurse wound consult note Reason for Consult:placement of NPWT to the midline surgical wound Wound type:surgical  Pressure Injury POA: NA Measurement: 22cm x 8cm x 7.5cm  Wound HDQ:QIWLN, subcutaneous tissue, several visualized sutures in the wound bed  Drainage (amount, consistency, odor) minimal on gauze dressing, no odor Periwound: intact; ileostomy just the lateral aspect of the right side of the wound bed  Dressing procedure/placement/frequency: Filled wound with 1 piece of black foam. Sealed NPWT dressing at 111mm HG Patient received IV pain medication per bedside nurse prior to dressing change, she is intubated with gtt in place for pain management  Patient tolerated procedure well  Ok for bedside nurse to change non-complex NPWT dressing M/W/F , however due to proximity to the stoma will need pouch changed with NPWT dressing, WOC nurse will follow M/W/F  [place] Nurse ostomy follow up Stoma type/location: RUQ, ileostomy Stomal assessment/size: 1 1/4" slightly oval shaped Peristomal assessment: intact  Treatment options for stomal/peristomal skin: 2" barrier ring Output bloody Ostomy pouching: 1pc.flex convex due to dip in abdominal topography  Education provided: NA Enrolled patient in [facility] Start Discharge program: No  WOC nurse team will follow along for support with NPWT and ostomy care/teaching  Thanks  [place] MSN, RN,CWOCN, CNS, CWON-AP [phone])

## 2018-07-26 ENCOUNTER — Other Ambulatory Visit: Payer: Self-pay

## 2018-07-26 ENCOUNTER — Inpatient Hospital Stay (HOSPITAL_COMMUNITY): Payer: Medicare Other

## 2018-07-26 LAB — CBC
HCT: 29.2 % — ABNORMAL LOW (ref 36.0–46.0)
Hemoglobin: 9 g/dL — ABNORMAL LOW (ref 12.0–15.0)
MCH: 28.8 pg (ref 26.0–34.0)
MCHC: 30.8 g/dL (ref 30.0–36.0)
MCV: 93.6 fL (ref 80.0–100.0)
Platelets: 99 10*3/uL — ABNORMAL LOW (ref 150–400)
RBC: 3.12 MIL/uL — ABNORMAL LOW (ref 3.87–5.11)
RDW: 16.1 % — ABNORMAL HIGH (ref 11.5–15.5)
WBC: 12.9 10*3/uL — ABNORMAL HIGH (ref 4.0–10.5)
nRBC: 0 % (ref 0.0–0.2)

## 2018-07-26 LAB — COMPREHENSIVE METABOLIC PANEL
ALT: 25 U/L (ref 0–44)
AST: 31 U/L (ref 15–41)
Albumin: 1.9 g/dL — ABNORMAL LOW (ref 3.5–5.0)
Alkaline Phosphatase: 84 U/L (ref 38–126)
Anion gap: 8 (ref 5–15)
BUN: 51 mg/dL — ABNORMAL HIGH (ref 8–23)
CO2: 32 mmol/L (ref 22–32)
Calcium: 7.7 mg/dL — ABNORMAL LOW (ref 8.9–10.3)
Chloride: 101 mmol/L (ref 98–111)
Creatinine, Ser: 2.22 mg/dL — ABNORMAL HIGH (ref 0.44–1.00)
GFR calc Af Amer: 23 mL/min — ABNORMAL LOW (ref >60)
GFR calc non Af Amer: 20 mL/min — ABNORMAL LOW (ref >60)
Glucose, Bld: 175 mg/dL — ABNORMAL HIGH (ref 70–99)
Potassium: 4.4 mmol/L (ref 3.5–5.1)
Sodium: 141 mmol/L (ref 135–145)
Total Bilirubin: 1 mg/dL (ref 0.3–1.2)
Total Protein: 4.3 g/dL — ABNORMAL LOW (ref 6.5–8.1)

## 2018-07-26 LAB — GLUCOSE, CAPILLARY
Glucose-Capillary: 159 mg/dL — ABNORMAL HIGH (ref 70–99)
Glucose-Capillary: 159 mg/dL — ABNORMAL HIGH (ref 70–99)
Glucose-Capillary: 162 mg/dL — ABNORMAL HIGH (ref 70–99)
Glucose-Capillary: 166 mg/dL — ABNORMAL HIGH (ref 70–99)
Glucose-Capillary: 171 mg/dL — ABNORMAL HIGH (ref 70–99)
Glucose-Capillary: 173 mg/dL — ABNORMAL HIGH (ref 70–99)
Glucose-Capillary: 189 mg/dL — ABNORMAL HIGH (ref 70–99)

## 2018-07-26 LAB — BODY FLUID CULTURE: Culture: NO GROWTH

## 2018-07-26 LAB — PHOSPHORUS: Phosphorus: 2.6 mg/dL (ref 2.5–4.6)

## 2018-07-26 LAB — MAGNESIUM: Magnesium: 2.3 mg/dL (ref 1.7–2.4)

## 2018-07-26 LAB — CALCIUM, IONIZED: Calcium, Ionized, Serum: 4.4 mg/dL — ABNORMAL LOW (ref 4.5–5.6)

## 2018-07-26 MED ORDER — TRAVASOL 10 % IV SOLN
INTRAVENOUS | Status: AC
  Administered 2018-07-26: 18:00:00 via INTRAVENOUS
  Filled 2018-07-26: qty 993.6

## 2018-07-26 MED ORDER — SODIUM CHLORIDE 0.9 % IV SOLN
1.0000 g | INTRAVENOUS | Status: DC
  Administered 2018-07-26 – 2018-07-28 (×3): 1 g via INTRAVENOUS
  Filled 2018-07-26 (×3): qty 1

## 2018-07-26 MED ORDER — LABETALOL HCL 5 MG/ML IV SOLN
10.0000 mg | INTRAVENOUS | Status: DC | PRN
  Administered 2018-07-26 – 2018-07-27 (×5): 10 mg via INTRAVENOUS
  Filled 2018-07-26 (×5): qty 4

## 2018-07-26 MED ORDER — PIPERACILLIN-TAZOBACTAM 3.375 G IVPB: 3.3750 g | Freq: Four times a day (QID) | INTRAVENOUS | Status: DC

## 2018-07-26 MED ORDER — FUROSEMIDE 10 MG/ML IJ SOLN
40.0000 mg | Freq: Once | INTRAMUSCULAR | Status: AC
  Administered 2018-07-26: 40 mg via INTRAVENOUS
  Filled 2018-07-26: qty 4

## 2018-07-26 MED ORDER — HYDRALAZINE HCL 20 MG/ML IJ SOLN
20.0000 mg | INTRAMUSCULAR | Status: DC | PRN
  Administered 2018-07-26 – 2018-08-06 (×6): 20 mg via INTRAVENOUS
  Filled 2018-07-26 (×7): qty 1

## 2018-07-26 NOTE — Progress Notes (Signed)
3 Days Post-Op   Subjective/Chief Complaint: Patient remains intubated but awake and alert.  She still requiring significant levels of oxygen and PEEP support.  Otherwise, she is doing well hemodynamically and follows commands.   Objective: Vital signs in last 24 hours: Temp:  [98.3 F (36.8 C)-100.3 F (37.9 C)] 99.7 F (37.6 C) (06/13 0800) Pulse Rate:  [81-112] 85 (06/13 0600) Resp:  [4-19] 15 (06/13 0600) BP: (108-177)/(39-54) 177/52 (06/13 0600) SpO2:  [88 %-95 %] 95 % (06/13 0600) Arterial Line BP: (69-142)/(36-109) 102/48 (06/12 2000) FiO2 (%):  [50 %-60 %] 55 % (06/13 0422) Last BM Date: (unknown)  Intake/Output from previous day: 06/12 0701 - 06/13 0700 In: 1971.7 [I.V.:1341; IV Piggyback:630.7] Out: 2440 [Urine:2125; Drains:270; Stool:45] Intake/Output this shift: No intake/output data recorded.  Incision/Wound: Wound VAC in place.  Ostomy functioning and right lower quadrant.  No rebound or guarding.  Lab Results:  Recent Labs    07/25/18 0500 07/26/18 0415  WBC 9.2 12.9*  HGB 9.3* 9.0*  HCT 29.1* 29.2*  PLT 116* 99*   BMET Recent Labs    07/25/18 0500 07/26/18 0600  NA 139 141  K 4.0 4.4  CL 98 101  CO2 27 32  GLUCOSE 191* 175*  BUN 48* 51*  CREATININE 2.58* 2.22*  CALCIUM 7.0* 7.7*   PT/INR No results for input(s): LABPROT, INR in the last 72 hours. ABG Recent Labs    07/25/18 0430 07/25/18 1630  PHART 7.437 7.442  HCO3 28.9* 32.3*    Studies/Results: Dg Chest Port 1 View  Result Date: 07/26/2018 CLINICAL DATA:  Pleural effusion. EXAM: PORTABLE CHEST 1 VIEW COMPARISON:  07/25/2018 FINDINGS: The patient is again rotated to the right. Endotracheal tube terminates just below the clavicles, well above the carina. Enteric tube courses into the stomach with tip not imaged. Left subclavian catheter terminates over the SVC. The cardiomediastinal silhouette is unchanged. The lungs remain hypoinflated with unchanged pulmonary vascular congestion.  Bilateral pleural effusions and patchy bibasilar airspace opacities also not significantly changed. No pneumothorax is identified. IMPRESSION: Unchanged pulmonary vascular congestion, bilateral pleural effusions, and bibasilar atelectasis or pneumonia. Electronically Signed   By: Logan Bores [name]   On: 07/26/2018 07:40   Dg Chest Port 1 View  Result Date: 07/25/2018 CLINICAL DATA:  Respiratory failure EXAM: PORTABLE CHEST 1 VIEW COMPARISON:  07/24/2018 FINDINGS: Cardiac shadow is stable. Endotracheal tube and gastric catheter as well as a left subclavian central line are again noted and stable. Central vascular congestion is noted with evidence of bilateral pleural effusions and bibasilar atelectasis. No new focal infiltrate is seen. IMPRESSION: Relatively stable appearance of the chest when compared with the previous day. Electronically Signed   By: Inez Catalina [name]   On: 07/25/2018 08:00    Anti-infectives: Anti-infectives (From admission, onward)   Start     Dose/Rate Route Frequency Ordered Stop   07/23/18 0200  piperacillin-tazobactam (ZOSYN) IVPB 2.25 g     2.25 g 100 mL/hr over 30 Minutes Intravenous Every 6 hours 07/22/18 2248     07/22/18 1800  piperacillin-tazobactam (ZOSYN) IVPB 3.375 g  Status:  Discontinued     3.375 g 12.5 mL/hr over 240 Minutes Intravenous Every 8 hours 07/22/18 0953 07/22/18 2248   07/22/18 1000  piperacillin-tazobactam (ZOSYN) IVPB 3.375 g     3.375 g 100 mL/hr over 30 Minutes Intravenous  Once 07/22/18 0953 07/22/18 1239   07/22/18 0745  ciprofloxacin (CIPRO) IVPB 400 mg     400 mg 200 mL/hr  over 60 Minutes Intravenous  Once 07/22/18 0739 07/22/18 0921   07/22/18 0745  metroNIDAZOLE (FLAGYL) IVPB 500 mg     500 mg 100 mL/hr over 60 Minutes Intravenous  Once 07/22/18 0739 07/22/18 1024      Assessment/Plan: s/p Procedure(s): TOTAL ABDOMINAL COLECTOMY, [name], CHOLECYSTECTOMY (N/A) Continue supportive care  Continue TNA  Hopefully  extubate her once her oxygen saturations improved.  CCM helping to manage.  LOS: 4 days    Clovis Pu Maliha Outten 07/26/2018

## 2018-07-26 NOTE — Progress Notes (Signed)
PHARMACY - ADULT TOTAL PARENTERAL NUTRITION CONSULT NOTE   Pharmacy Consult for TPN Indication: POD2 massive bowel resection  Patient Measurements: Height: 5\' 2"  (157.5 cm) Weight: 239 lb 3.2 oz (108.5 kg) IBW/kg (Calculated) : 50.1 TPN AdjBW (KG): 60.3 Body mass index is 43.75 kg/m.  Insulin Requirements: 19 units moderate scale SSI last 24hr  Current Nutrition: NPO  IVF: none  Central access: PICC TPN start date: 6/11  ASSESSMENT                                                                                                          HPI: 78 yo female POD1 total abdominal colectomy, brooke ileostomy and cholecystecomy to start TPN today. Note patient currently intubated on ventilator sedated on fentanyl drip as well as on pressors with NE drip. Patient in acute renal failure and with metabolic acidosis on bicarb drip  Significant events:   Today, 07/26/18  Glucose (goal CBGs < 150): since new TPN started last PM with decreased dextrose amount - 170, 159, 171, 166  Electrolytes - all WNL  Renal - SCr 2.22 CrCl 24, trending down  LFTs - AST/ALT 31/25  TGs - 225 (6/12)  Prealbumin - 5 (6/12)  NUTRITIONAL GOALS                                                                                             RD recs: 100g protein/day, 1000-1270 kcal/day - high protein, hypocaloric formula  Custom TPN (high protein, hypocaloric formula) at a new goal rate of 96ml/hr to provide  99g/day protein and 1175 kcal/day.  PLAN                                                                                                                         At 1800 today:  Continue custom TPN high protein hypocaloric formula due to guidelines for critically obese ICU patient - see above for new goals. Will increase rate from 50 ml/lhr to goal 60 ml/hr with new bag - continue protein to 69g/L, dextrose 10%, and lipid 20g/L  TPN will have standard concentrations of electrolytes except: add phos to  TPN at 15, Ca at 10,  Na at 75, change Cl:Acet to 1:1  TPN to contain standard multivitamins daily and trace elements MWF only due to national shortage  IVF's d/c'd per Md  Continue SSI/CBGs q4h - will add small amount of insulin in TPN tomorrow if CBGs continue to be > 150 consistently  TPN lab panels on Mondays & Thursdays.   Adrian Saran, PharmD, BCPS 07/26/2018 9:06 AM

## 2018-07-26 NOTE — Progress Notes (Signed)
Pharmacy Antibiotic Note  Hailey Hughes is a 78 y.o. female with hx colon resection for megacolon presented to the ED on 07/22/2018 with c/o abd pain. Abd CT on 6/9 showed findings with suspicion for colitis.  To start zosyn for intra abdominal infection and now kleb pneumo UTI  Today, 07/26/18  Day 5 Zosyn  SCr slowly trending down - 2.22 CrCl 24  WBC 12.9 up  Tmax 100.7 on 6/12 AM, afebrile so far 6/13   Plan:  Due to improvement in SCr, change Zosyn from 2.25g IV q6 to Zosyn 3.375g IV q8 (extended interval infusion)  Would suggest narrow abx's if possible, especially with urine culture results finalized _________________________________  Height: 5\' 2"  (157.5 cm) Weight: 239 lb 3.2 oz (108.5 kg) IBW/kg (Calculated) : 50.1  Temp (24hrs), Avg:99.6 F (37.6 C), Min:98.3 F (36.8 C), Max:100.3 F (37.9 C)  Recent Labs  Lab 07/22/18 0247  07/22/18 1802 07/22/18 2036 07/23/18 0057 07/23/18 1013  07/23/18 1603 07/23/18 1938 07/24/18 0331 07/24/18 1700 07/25/18 0500 07/26/18 0415 07/26/18 0600  WBC 17.6*  --   --  13.9*  --   --   --   --   --  8.0  --  9.2 12.9*  --   CREATININE 2.01*  --   --  2.66*  --   --    < > 2.79* 2.74* 2.68* 2.63* 2.58*  --  2.22*  LATICACIDVEN 5.0*   < > 5.4* 4.2* 3.5* 4.9*  --  5.6*  --   --   --   --   --   --    < > = values in this interval not displayed.    Estimated Creatinine Clearance: 23.5 mL/min (A) (by C-G formula based on SCr of 2.22 mg/dL (H)).    Allergies  Allergen Reactions  . Bee Venom Anaphylaxis  . Epinephrine Other (See Comments)    REACTION: Nervousness    Antimicrobials this admission:  6/9 Cipro/flagyl x1 6/9 zosyn>>  Dose adjustments this admission:  6/9: Zosyn EI > 2.25gm q6 6/13 Zosyn changed to [facility] q8  Microbiology results:  6/9 BCx x1set: ngtd 6/9 UCx: kleb pneumo 6/9 Covid: neg  6/9 MRSA PCR: neg   Thank you for allowing pharmacy to be a part of this patient's care.  Adrian Saran, PharmD,  BCPS 07/26/2018 9:04 AM

## 2018-07-26 NOTE — Progress Notes (Signed)
NAME:  Hailey Hughes, MRN:  [id number], DOB:  [birth year], LOS: 4 ADMISSION DATE:  07/22/2018, CONSULTATION DATE: 07/22/2018 REFERRING MD: Raphael Gibney MD, CHIEF COMPLAINT: Colitis  Brief History   Patient is an 78 year old white female admitted the hospital 4/9 with CT scan evidence of colitis transferred to the ICU that evening due to worsening hypotension  Past Medical History  Prior multiple abdominal surgeries, hypothyroidism, type 2 diabetes rheumatoid arthritis, obstructive sleep apnea  Significant Hospital Events   Admission 07/22/2018 transferred to ICU for hypotension 6/10: Received multiple boluses of IV crystalloid remained hypotensive requiring phenylephrine infusion.  Started on vasopressin on top of phenylephrine, central line placed.  Patient did have episode of vomiting.  Developed worsening work of breathing, desaturation, and worsening metabolic acidosis Ultimately required intubation.  Lactic acid initially slightly improved from peak of 5.4 down to 3.5. having exquisite abdominal pain, surgical services consulted->went for emergent exploratory lap, found to have ischemic colitis and possibly ischemic GB she underwent total colectomy w/ ileostomy placement as well as cholecystectomy. She returned to the ICU w/ three vasoactive gtts and bicarb gtt doubled. PCCM asked to resume care.  6/11: Acid-base improving, vasoactive drip requirements decreased, chest x-ray worse with worsening airspace disease and right effusion, decreasing IV fluid rate PEEP adjusted up to facilitate weaning FiO2 starting TNA 6/12.  Vasopressin off.  Weaning norepinephrine to off.  Starting diuresis.  Continuing antibiotics, TNA, and supportive care.  Discontinuing bicarbonate infusion.  Overall improving working towards pressure support ventilation 6/13 still requiring PEEP of 8, 50%  Consults:  Critical care 6/9 General surgery consulted 6/10  Procedures:  Intubation 6/10 CVL left subclavian 6/10  Arterial line 6/10  Significant Diagnostic Tests:    Micro Data:  MRSA screen 6/9: neg BCX2 6/9>>> COVID-19 6/9: neg Urine culture 6/9: Klebsiella pneumoniae and sensitive to Rocephin  Antimicrobials:  Zosyn 6/9-6/13 Rocephin 6/13  Interim history/subjective:  No issues overnight, continues to improve Still requiring PEEP of 8  Objective   Blood pressure (!) 157/45, pulse 78, temperature 99.5 F (37.5 C), temperature source Axillary, resp. rate 17, height 5\' 2"  (1.575 m), weight 108.5 kg, SpO2 93 %.    Vent Mode: PRVC FiO2 (%):  [50 %-60 %] 50 % Set Rate:  [14 bmp] 14 bmp Vt Set:  [400 mL] 400 mL PEEP:  [8 cmH20] 8 cmH20 Plateau Pressure:  [18 cmH20-20 cmH20] 19 cmH20   Intake/Output Summary (Last 24 hours) at 07/26/2018 1334 Last data filed at 07/26/2018 1022 Gross per 24 hour  Intake 964.18 ml  Output 2565 ml  Net -1600.82 ml   Filed Weights   07/22/18 0148 07/24/18 1132  Weight: 90.7 kg 108.5 kg   Examination: Elderly lady Comfortable Orally intubated Rhonchi bilaterally S1-S2 appreciated Abdomen is soft, nontender Extremities shows no clubbing, has mild edema Neurologically, arousable  Resolved Hospital Problem list   Hyperkalemia Elevated Lipase (normalized 6/12) Non-gap and non-anion gap metabolic acidosis Lactic acidosis  Assessment & Plan:    Septic shock secondary to ischemic colitis and bacterial translocation -Body fluid culture negative -urine positive for Klebsiella -Switch to Rocephin -Hemodynamic support  Klebsiella UTI -Switch to Rocephin  Hypoxemic respiratory failure -Pleural effusions, this should clear with diuresis -1.6 L negative -Hold off on sedating meds -VAP bundle -Follow x-rays  Acute kidney injury -Renal dose medications -Strict input output -Trend electrolytes  Fluid and electrolyte imbalance, hypocalcemia -Replete electrolytes  Multiple cytopenia in the setting of sepsis -Trend CBC  Diabetes SSI   Hypothyroidism -  Continue Synthroid  Not ready for extubation today We will continue to wean Possibly should be ready for extubation 07/27/2018 Continue diuretics  Nutrition -Continue TPN  Best practice:  Diet: NPO, on TPN Pain/Anxiety/Delirium protocol (if indicated): na VAP protocol (if indicated): na DVT prophylaxis: scd GI prophylaxis: protonix Glucose control: sliding scale Mobility: bed Code Status: full  The patient is critically ill with multiple organ system failure and requires high complexity decision making for assessment and support, frequent evaluation and titration of therapies, advanced monitoring, review of radiographic studies and interpretation of complex data.    Critical Care Time devoted to patient care services, exclusive of separately billable procedures, described in this note is 30 minutes.  Sherrilyn Rist, MD [place], PCCM Cell: [phone]

## 2018-07-27 ENCOUNTER — Inpatient Hospital Stay (HOSPITAL_COMMUNITY): Payer: Medicare Other

## 2018-07-27 LAB — BASIC METABOLIC PANEL
Anion gap: 6 (ref 5–15)
BUN: 62 mg/dL — ABNORMAL HIGH (ref 8–23)
CO2: 31 mmol/L (ref 22–32)
Calcium: 8.2 mg/dL — ABNORMAL LOW (ref 8.9–10.3)
Chloride: 107 mmol/L (ref 98–111)
Creatinine, Ser: 2.21 mg/dL — ABNORMAL HIGH (ref 0.44–1.00)
GFR calc Af Amer: 24 mL/min — ABNORMAL LOW (ref >60)
GFR calc non Af Amer: 20 mL/min — ABNORMAL LOW (ref >60)
Glucose, Bld: 212 mg/dL — ABNORMAL HIGH (ref 70–99)
Potassium: 4 mmol/L (ref 3.5–5.1)
Sodium: 144 mmol/L (ref 135–145)

## 2018-07-27 LAB — GLUCOSE, CAPILLARY
Glucose-Capillary: 151 mg/dL — ABNORMAL HIGH (ref 70–99)
Glucose-Capillary: 151 mg/dL — ABNORMAL HIGH (ref 70–99)
Glucose-Capillary: 154 mg/dL — ABNORMAL HIGH (ref 70–99)
Glucose-Capillary: 175 mg/dL — ABNORMAL HIGH (ref 70–99)
Glucose-Capillary: 186 mg/dL — ABNORMAL HIGH (ref 70–99)
Glucose-Capillary: 186 mg/dL — ABNORMAL HIGH (ref 70–99)
Glucose-Capillary: 191 mg/dL — ABNORMAL HIGH (ref 70–99)

## 2018-07-27 LAB — CULTURE, BLOOD (ROUTINE X 2)
Culture: NO GROWTH
Special Requests: ADEQUATE

## 2018-07-27 LAB — ANAEROBIC CULTURE

## 2018-07-27 LAB — PHOSPHORUS: Phosphorus: 3 mg/dL (ref 2.5–4.6)

## 2018-07-27 LAB — MAGNESIUM: Magnesium: 2 mg/dL (ref 1.7–2.4)

## 2018-07-27 MED ORDER — LABETALOL HCL 5 MG/ML IV SOLN
10.0000 mg | INTRAVENOUS | Status: AC | PRN
  Administered 2018-07-27 – 2018-07-29 (×5): 20 mg via INTRAVENOUS
  Filled 2018-07-27 (×5): qty 4

## 2018-07-27 MED ORDER — HYDROMORPHONE HCL 1 MG/ML IJ SOLN
0.5000 mg | INTRAMUSCULAR | Status: DC | PRN
  Administered 2018-07-27 – 2018-07-28 (×5): 1 mg via INTRAVENOUS
  Filled 2018-07-27 (×5): qty 1

## 2018-07-27 MED ORDER — LABETALOL HCL 5 MG/ML IV SOLN: 20.0000 mg | INTRAVENOUS | Status: DC | PRN

## 2018-07-27 MED ORDER — TRAVASOL 10 % IV SOLN
INTRAVENOUS | Status: AC
  Filled 2018-07-27: qty 993.6

## 2018-07-27 MED ORDER — FUROSEMIDE 10 MG/ML IJ SOLN
40.0000 mg | Freq: Once | INTRAMUSCULAR | Status: AC
  Administered 2018-07-27: 40 mg via INTRAVENOUS
  Filled 2018-07-27: qty 4

## 2018-07-27 NOTE — Progress Notes (Signed)
NAME:  Hailey Hughes, MRN:  [id number], DOB:  1940/06/14, LOS: 5 ADMISSION DATE:  07/22/2018, CONSULTATION DATE: 07/22/2018 REFERRING MD: Francia Greaves MD, CHIEF COMPLAINT: Colitis  Brief History   Patient is an 78 year old white female admitted the hospital 6/9 with CT scan evidence of colitis transferred to the ICU  for septic shock Underwent total colectomy for ischemic colitis  Past Medical History  Prior multiple abdominal surgeries, hypothyroidism, type 2 diabetes rheumatoid arthritis, obstructive sleep apnea  Significant Hospital Events   Admission 07/22/2018 transferred to ICU for hypotension 6/10: Received multiple boluses of IV crystalloid remained hypotensive requiring phenylephrine infusion.  Started on vasopressin on top of phenylephrine, central line placed.  Patient did have episode of vomiting.  Developed worsening work of breathing, desaturation, and worsening metabolic acidosis Ultimately required intubation.  Lactic acid initially slightly improved from peak of 5.4 down to 3.5. having exquisite abdominal pain, surgical services consulted->went for emergent exploratory lap, found to have ischemic colitis and possibly ischemic GB she underwent total colectomy w/ ileostomy placement as well as cholecystectomy. She returned to the ICU w/ three vasoactive gtts and bicarb gtt doubled. PCCM asked to resume care.  6/11: Acid-base improving, vasoactive drip requirements decreased, chest x-ray worse with worsening airspace disease and right effusion, decreasing IV fluid rate PEEP adjusted up to facilitate weaning FiO2 starting TNA 6/12.  Vasopressin off.  Weaning norepinephrine to off.  Starting diuresis.  Continuing antibiotics, TNA, and supportive care.  Discontinuing bicarbonate infusion.  Overall improving working towards pressure support ventilation 6/13 still requiring PEEP of 8, 50%  Consults:  Critical care 6/9 General surgery consulted 6/10  Procedures:  ETT 6/10 >> CVL left  subclavian 6/10 >> Arterial line 6/10  Significant Diagnostic Tests:    Micro Data:  MRSA screen 6/9: neg BCX2 6/9>>>ng COVID-19 6/9: neg Urine culture 6/9: Klebsiella pneumoniae and sensitive to Rocephin  Antimicrobials:  Zosyn 6/9-6/13 Rocephin 6/13 >>  Interim history/subjective:   Concern for aspiration of bilious secretions overnight with increasing hypoxia to 70% FiO2, remains on PEEP of 8 Critically ill, intubated Low-grade fevers overnight 100.9 Good urine output   Objective   Blood pressure (!) 170/46, pulse 73, temperature 98.2 F (36.8 C), temperature source Axillary, resp. rate 19, height 5\' 2"  (1.575 m), weight 108.5 kg, SpO2 98 %.    Vent Mode: PRVC FiO2 (%):  [50 %-70 %] 70 % Set Rate:  [14 bmp] 14 bmp Vt Set:  [400 mL] 400 mL PEEP:  [8 cmH20] 8 cmH20 Plateau Pressure:  [18 cmH20-22 cmH20] 18 cmH20   Intake/Output Summary (Last 24 hours) at 07/27/2018 1034 Last data filed at 07/27/2018 0800 Gross per 24 hour  Intake 1128.22 ml  Output 2795 ml  Net -1666.78 ml   Filed Weights   07/22/18 0148 07/24/18 1132  Weight: 90.7 kg 108.5 kg   Examination: Elderly lady, obese, no distress, orally intubated Mild pallor, no icterus Decreased breath sounds bilateral, no rhonchi S1-S2 regular Abdomen is soft, nontender, serous drainage in JP drain Extremities shows no clubbing, has 1+ edema Awake interactive, nonfocal  Resolved Hospital Problem list   Hyperkalemia Elevated Lipase (normalized 6/12) Non-gap and non-anion gap metabolic acidosis Lactic acidosis  Assessment & Plan:    Septic shock secondary to ischemic colitis and bacterial translocation Klebsiella UTI -Body fluid culture negative -urine positive for Klebsiella -ct Rocephin-plan total 7-10  days  Acute Hypoxemic respiratory failure -Pleural effusions - should clear with diuresis - keep PEEP at 8, okay to start  spontaneous breathing trials -Some concern for aspiration 6/13 overnight   Acute kidney injury -baseline cr 1.3 in 2015 -Renal dose medications, holding benazepril -Strict input output -Trend electrolytes -Repeat Lasix 40 for negative balance  Protein calorie malnutrition, mild -To surgery for ischemic bowel -Continue TNA -Restart tube feeds?  Hypertension-okay to use labetalol or hydralazine as needed  Diabetes SSI  Hypothyroidism -Continue Synthroid      Best practice:  Diet: NPO, on TPN Pain/Anxiety/Delirium protocol (if indicated): na VAP protocol (if indicated): na DVT prophylaxis: scd GI prophylaxis: protonix Glucose control: sliding scale Mobility: bed Code Status: full  The patient is critically ill with multiple organ systems failure and requires high complexity decision making for assessment and support, frequent evaluation and titration of therapies, application of advanced monitoring technologies and extensive interpretation of multiple databases. Critical Care Time devoted to patient care services described in this note independent of APP/resident  time is 35 minutes.   Cyril Mourning MD. Tonny Bollman. [facility] Pager [phone] If no response call [phone]   07/27/2018

## 2018-07-27 NOTE — Progress Notes (Addendum)
PHARMACY - ADULT TOTAL PARENTERAL NUTRITION CONSULT NOTE   Pharmacy Consult for TPN Indication: POD3 massive bowel resection  Patient Measurements: Height: 5\' 2"  (157.5 cm) Weight: 239 lb 3.2 oz (108.5 kg) IBW/kg (Calculated) : 50.1 TPN AdjBW (KG): 60.3 Body mass index is 43.75 kg/m.  Insulin Requirements: 25 units moderate scale SSI last 24hr  Current Nutrition: NPO  IVF: none  Central access: PICC TPN start date: 6/11  ASSESSMENT                                                                                                          HPI: 78 yo female POD1 total abdominal colectomy, brooke ileostomy and cholecystecomy to start TPN today. Note patient currently intubated on ventilator sedated on fentanyl drip as well as on pressors with NE drip. Patient in acute renal failure and with metabolic acidosis on bicarb drip  Significant events:   Today, 07/27/18  Glucose (goal CBGs < 150): consistently above goal - range 159-191. Note hx DM on metformin PTA  Electrolytes - all WNL  Renal - SCr 2.21 unchanged CrCl 24  LFTs - AST/ALT 31/25  TGs - 225 (6/12)  Prealbumin - 5 (6/12)  NUTRITIONAL GOALS                                                                                             RD recs: 100g protein/day, 1000-1270 kcal/day - high protein, hypocaloric formula  Custom TPN (high protein, hypocaloric formula) at a new goal rate of 62ml/hr to provide  99g/day protein and 1175 kcal/day.  PLAN                                                                                                                         At 1800 today:  Continue custom TPN high protein hypocaloric formula due to guidelines for critically obese ICU patient - see above for new goals. Continue TPN at goal 60 ml/hr - protein 69g/L, dextrose 10%, and lipid 20g/L  TPN will have standard concentrations of electrolytes except: continue phos at 15, Ca at 10, Na at 75, continue Cl:Acet at 1:1  TPN to  contain standard multivitamins daily and  trace elements MWF only due to national shortage  IVF's d/c'd per Md  Continue SSI/CBGs q4h, add 18 units insulin to TPN  TPN lab panels on Mondays & Thursdays.   Adrian Saran, PharmD, BCPS 07/27/2018 9:41 AM

## 2018-07-27 NOTE — Progress Notes (Signed)
Noticed pt was making a gurgling noise around 0230 07/27/2018. Suctioned pt and got out green secretions. Immediately called respiratory and notified Dr. Jimmy Footman. Received orders to put pt's OG tube to low intermittent suction and to get a chest Xray. The OG put out 900 mL of green bile in about 20 minutes. Pt denies feeling nauseous but this RN will continue to monitor the output and the pts response to the interventions.

## 2018-07-27 NOTE — Progress Notes (Signed)
4 Days Post-Op   Subjective/Chief Complaint: Patient awake and alert.  She had an episode of what was felt to be aspiration last night.  Chest x-ray showed pattern concerning for aspiration.  She still requires high levels of PEEP and oxygen.  She is doing well hemodynamically.  She does open her eyes and can respond.   Objective: Vital signs in last 24 hours: Temp:  [98.2 F (36.8 C)-100.9 F (38.3 C)] 98.2 F (36.8 C) (06/14 0902) Pulse Rate:  [73-95] 73 (06/14 0900) Resp:  [9-34] 19 (06/14 0900) BP: (124-186)/(43-65) 170/46 (06/14 0900) SpO2:  [92 %-99 %] 98 % (06/14 0900) FiO2 (%):  [50 %-70 %] 70 % (06/14 0321) Last BM Date: (ostomy)  Intake/Output from previous day: 06/13 0701 - 06/14 0700 In: 1138.2 [I.V.:1040.5; IV Piggyback:97.7] Out: 2795 [Urine:1375; Emesis/NG output:1000; Drains:420] Intake/Output this shift: Total I/O In: -  Out: 150 [Stool:150]  Incision/Wound: Ostomy functioning.  This is in the right lower quadrant.  Wound VAC in place  Lab Results:  Recent Labs    07/25/18 0500 07/26/18 0415  WBC 9.2 12.9*  HGB 9.3* 9.0*  HCT 29.1* 29.2*  PLT 116* 99*   BMET Recent Labs    07/26/18 0600 07/27/18 0335  NA 141 144  K 4.4 4.0  CL 101 107  CO2 32 31  GLUCOSE 175* 212*  BUN 51* 62*  CREATININE 2.22* 2.21*  CALCIUM 7.7* 8.2*   PT/INR No results for input(s): LABPROT, INR in the last 72 hours. ABG Recent Labs    07/25/18 0430 07/25/18 1630  PHART 7.437 7.442  HCO3 28.9* 32.3*    Studies/Results: Dg Chest Port 1 View  Result Date: 07/27/2018 CLINICAL DATA:  Shortness of breath EXAM: PORTABLE CHEST 1 VIEW COMPARISON:  07/26/2018 FINDINGS: Support devices are in stable position. Bilateral perihilar and lower lobe airspace opacities, slightly improved. Heart is normal size. Suspect small effusions. No acute bony abnormality. IMPRESSION: Bilateral lower lobe airspace opacities and perihilar opacities, slightly improved since prior study.  Suspect small effusions. Electronically Signed   By: Charlett Nose [name]   On: 07/27/2018 03:27   Dg Chest Port 1 View  Result Date: 07/26/2018 CLINICAL DATA:  Pleural effusion. EXAM: PORTABLE CHEST 1 VIEW COMPARISON:  07/25/2018 FINDINGS: The patient is again rotated to the right. Endotracheal tube terminates just below the clavicles, well above the carina. Enteric tube courses into the stomach with tip not imaged. Left subclavian catheter terminates over the SVC. The cardiomediastinal silhouette is unchanged. The lungs remain hypoinflated with unchanged pulmonary vascular congestion. Bilateral pleural effusions and patchy bibasilar airspace opacities also not significantly changed. No pneumothorax is identified. IMPRESSION: Unchanged pulmonary vascular congestion, bilateral pleural effusions, and bibasilar atelectasis or pneumonia. Electronically Signed   By: Sebastian Ache [name]   On: 07/26/2018 07:40    Anti-infectives: Anti-infectives (From admission, onward)   Start     Dose/Rate Route Frequency Ordered Stop   07/26/18 1400  piperacillin-tazobactam (ZOSYN) IVPB 3.375 g  Status:  Discontinued     3.375 g 12.5 mL/hr over 240 Minutes Intravenous Every 6 hours 07/26/18 0906 07/26/18 1339   07/26/18 1400  cefTRIAXone (ROCEPHIN) 1 g in sodium chloride 0.9 % 100 mL IVPB     1 g 200 mL/hr over 30 Minutes Intravenous Every 24 hours 07/26/18 1339 07/30/18 1359   07/23/18 0200  piperacillin-tazobactam (ZOSYN) IVPB 2.25 g  Status:  Discontinued     2.25 g 100 mL/hr over 30 Minutes Intravenous Every 6 hours 07/22/18  2248 07/26/18 0906   07/22/18 1800  piperacillin-tazobactam (ZOSYN) IVPB 3.375 g  Status:  Discontinued     3.375 g 12.5 mL/hr over 240 Minutes Intravenous Every 8 hours 07/22/18 0953 07/22/18 2248   07/22/18 1000  piperacillin-tazobactam (ZOSYN) IVPB 3.375 g     3.375 g 100 mL/hr over 30 Minutes Intravenous  Once 07/22/18 0953 07/22/18 1239   07/22/18 0745  ciprofloxacin (CIPRO) IVPB 400 mg      400 mg 200 mL/hr over 60 Minutes Intravenous  Once 07/22/18 0739 07/22/18 0921   07/22/18 0745  metroNIDAZOLE (FLAGYL) IVPB 500 mg     500 mg 100 mL/hr over 60 Minutes Intravenous  Once 07/22/18 0739 07/22/18 1024      Assessment/Plan: s/p Procedure(s): TOTAL ABDOMINAL COLECTOMY, [name], CHOLECYSTECTOMY (N/A) Continue supportive care  Continue TNA IV fluids and antibiotics  CCM managing vent and hopefully can make some route today  VAC change Monday  LOS: 5 days    Marcello Moores A Varsha Knock 07/27/2018

## 2018-07-28 ENCOUNTER — Inpatient Hospital Stay (HOSPITAL_COMMUNITY): Payer: Medicare Other

## 2018-07-28 LAB — COMPREHENSIVE METABOLIC PANEL
ALT: 20 U/L (ref 0–44)
AST: 26 U/L (ref 15–41)
Albumin: 1.7 g/dL — ABNORMAL LOW (ref 3.5–5.0)
Alkaline Phosphatase: 92 U/L (ref 38–126)
Anion gap: 10 (ref 5–15)
BUN: 76 mg/dL — ABNORMAL HIGH (ref 8–23)
CO2: 31 mmol/L (ref 22–32)
Calcium: 8.4 mg/dL — ABNORMAL LOW (ref 8.9–10.3)
Chloride: 106 mmol/L (ref 98–111)
Creatinine, Ser: 2.16 mg/dL — ABNORMAL HIGH (ref 0.44–1.00)
GFR calc Af Amer: 24 mL/min — ABNORMAL LOW (ref >60)
GFR calc non Af Amer: 21 mL/min — ABNORMAL LOW (ref >60)
Glucose, Bld: 154 mg/dL — ABNORMAL HIGH (ref 70–99)
Potassium: 4.1 mmol/L (ref 3.5–5.1)
Sodium: 147 mmol/L — ABNORMAL HIGH (ref 135–145)
Total Bilirubin: 0.7 mg/dL (ref 0.3–1.2)
Total Protein: 4.4 g/dL — ABNORMAL LOW (ref 6.5–8.1)

## 2018-07-28 LAB — CBC
HCT: 28.1 % — ABNORMAL LOW (ref 36.0–46.0)
Hemoglobin: 8.6 g/dL — ABNORMAL LOW (ref 12.0–15.0)
MCH: 28.9 pg (ref 26.0–34.0)
MCHC: 30.6 g/dL (ref 30.0–36.0)
MCV: 94.3 fL (ref 80.0–100.0)
Platelets: 91 10*3/uL — ABNORMAL LOW (ref 150–400)
RBC: 2.98 MIL/uL — ABNORMAL LOW (ref 3.87–5.11)
RDW: 16.1 % — ABNORMAL HIGH (ref 11.5–15.5)
WBC: 13.7 10*3/uL — ABNORMAL HIGH (ref 4.0–10.5)
nRBC: 0.2 % (ref 0.0–0.2)

## 2018-07-28 LAB — DIFFERENTIAL
Abs Immature Granulocytes: 0.25 10*3/uL — ABNORMAL HIGH (ref 0.00–0.07)
Basophils Absolute: 0.1 10*3/uL (ref 0.0–0.1)
Basophils Relative: 0 %
Eosinophils Absolute: 0.1 10*3/uL (ref 0.0–0.5)
Eosinophils Relative: 1 %
Immature Granulocytes: 2 %
Lymphocytes Relative: 9 %
Lymphs Abs: 1.2 10*3/uL (ref 0.7–4.0)
Monocytes Absolute: 1.2 10*3/uL — ABNORMAL HIGH (ref 0.1–1.0)
Monocytes Relative: 9 %
Neutro Abs: 10.9 10*3/uL — ABNORMAL HIGH (ref 1.7–7.7)
Neutrophils Relative %: 79 %

## 2018-07-28 LAB — ANAEROBIC CULTURE

## 2018-07-28 LAB — PREALBUMIN: Prealbumin: <5 mg/dL — ABNORMAL LOW (ref 18–38)

## 2018-07-28 LAB — TRIGLYCERIDES: Triglycerides: 176 mg/dL — ABNORMAL HIGH (ref <150)

## 2018-07-28 LAB — MAGNESIUM: Magnesium: 2 mg/dL (ref 1.7–2.4)

## 2018-07-28 LAB — GLUCOSE, CAPILLARY
Glucose-Capillary: 131 mg/dL — ABNORMAL HIGH (ref 70–99)
Glucose-Capillary: 133 mg/dL — ABNORMAL HIGH (ref 70–99)
Glucose-Capillary: 138 mg/dL — ABNORMAL HIGH (ref 70–99)
Glucose-Capillary: 145 mg/dL — ABNORMAL HIGH (ref 70–99)
Glucose-Capillary: 149 mg/dL — ABNORMAL HIGH (ref 70–99)
Glucose-Capillary: 151 mg/dL — ABNORMAL HIGH (ref 70–99)

## 2018-07-28 LAB — PHOSPHORUS: Phosphorus: 3.9 mg/dL (ref 2.5–4.6)

## 2018-07-28 MED ORDER — TRAVASOL 10 % IV SOLN: INTRAVENOUS | Status: DC

## 2018-07-28 MED ORDER — HYDROMORPHONE HCL 1 MG/ML IJ SOLN: 0.5000 mg | INTRAMUSCULAR | Status: DC | PRN

## 2018-07-28 MED ORDER — TRAVASOL 10 % IV SOLN
INTRAVENOUS | Status: AC
  Administered 2018-07-28: 18:00:00 via INTRAVENOUS
  Filled 2018-07-28: qty 993.6

## 2018-07-28 MED ORDER — ACETAMINOPHEN 160 MG/5ML PO SOLN
1000.0000 mg | Freq: Four times a day (QID) | ORAL | Status: DC
  Administered 2018-07-28 – 2018-08-03 (×9): 1000 mg via ORAL
  Filled 2018-07-28 (×11): qty 40.6

## 2018-07-28 MED ORDER — METHOCARBAMOL 1000 MG/10ML IJ SOLN
500.0000 mg | Freq: Three times a day (TID) | INTRAVENOUS | Status: DC
  Administered 2018-07-28 – 2018-07-29 (×3): 500 mg via INTRAVENOUS
  Filled 2018-07-28: qty 5
  Filled 2018-07-28: qty 500
  Filled 2018-07-28 (×2): qty 5
  Filled 2018-07-28 (×2): qty 500
  Filled 2018-07-28: qty 5

## 2018-07-28 MED ORDER — ORAL CARE MOUTH RINSE
15.0000 mL | Freq: Two times a day (BID) | OROMUCOSAL | Status: DC
  Administered 2018-07-28 – 2018-08-18 (×35): 15 mL via OROMUCOSAL

## 2018-07-28 MED ORDER — OXYCODONE HCL 5 MG PO TABS
5.0000 mg | ORAL_TABLET | ORAL | Status: DC | PRN
  Administered 2018-07-28 – 2018-08-03 (×2): 5 mg via ORAL
  Administered 2018-08-03 – 2018-08-08 (×12): 10 mg via ORAL
  Administered 2018-08-09 (×2): 5 mg via ORAL
  Administered 2018-08-10: 10 mg via ORAL
  Administered 2018-08-11: 5 mg via ORAL
  Administered 2018-08-12 (×3): 10 mg via ORAL
  Administered 2018-08-14 – 2018-08-16 (×3): 5 mg via ORAL
  Administered 2018-08-17: 10 mg via ORAL
  Filled 2018-07-28 (×2): qty 1
  Filled 2018-07-28: qty 2
  Filled 2018-07-28: qty 1
  Filled 2018-07-28: qty 2
  Filled 2018-07-28: qty 1
  Filled 2018-07-28 (×11): qty 2
  Filled 2018-07-28: qty 1
  Filled 2018-07-28: qty 2
  Filled 2018-07-28 (×2): qty 1
  Filled 2018-07-28 (×2): qty 2
  Filled 2018-07-28: qty 1
  Filled 2018-07-28: qty 2
  Filled 2018-07-28: qty 1
  Filled 2018-07-28: qty 2

## 2018-07-28 MED ORDER — HYDROMORPHONE HCL 1 MG/ML IJ SOLN
0.5000 mg | INTRAMUSCULAR | Status: DC | PRN
  Administered 2018-07-28 – 2018-07-29 (×4): 2 mg via INTRAVENOUS
  Filled 2018-07-28 (×4): qty 2

## 2018-07-28 NOTE — Progress Notes (Signed)
PHARMACY - ADULT TOTAL PARENTERAL NUTRITION CONSULT NOTE   Pharmacy Consult for TPN Indication: s/p massive bowel resection  Patient Measurements: Height: 5\' 2"  (157.5 cm) Weight: 239 lb 3.2 oz (108.5 kg) IBW/kg (Calculated) : 50.1 TPN AdjBW (KG): 60.3 Body mass index is 43.75 kg/m.  Insulin Requirements: 17 units moderate scale SSI last 24hr  Current Nutrition: NPO  IVF: none  Central access: PICC TPN start date: 6/11  ASSESSMENT                                                                                                          HPI: 78 yo female s/p total abdominal colectomy, brooke ileostomy and cholecystecomy on 6/10 to start TPN.   Significant events:   Today, 07/28/18  Glucose (goal CBGs < 150): improved but slightly above goal - range 133-154. Note hx DM on metformin PTA  Electrolytes - all WNL except Na slightly elevated at 147.  Renal - SCr 2.16, slowly improving  LFTs - AST/ALT WNL  TGs - 225 (6/12), 176 (6/15)  Prealbumin - 5 (6/12), <5 (6/15)  NUTRITIONAL GOALS                                                                                             RD recs: 100g protein/day, 1000-1270 kcal/day - high protein, hypocaloric formula  Custom TPN (high protein, hypocaloric formula) at a new goal rate of 83ml/hr to provide  99g/day protein and 1175 kcal/day.  PLAN                                                                                                                         At 1800 today:  Continue custom TPN high protein hypocaloric formula due to guidelines for critically obese ICU patient - see above for new goals. Continue TPN at goal 60 ml/hr - protein 69g/L, dextrose 10%, and lipid 20g/L  TPN will have standard concentrations of electrolytes except: continue phos at 15, Ca at 10, Na at 40, continue Cl:Acet at 1:1  TPN to contain standard multivitamins daily and trace elements MWF only due to national shortage  IVF's d/c'd per  Md  Continue SSI/CBGs q4h,  add 25 units insulin to TPN  TPN lab panels on Mondays & Thursdays, BMET in am.   Netta Cedars, PharmD, BCPS 07/28/2018 8:59 AM

## 2018-07-28 NOTE — Consult Note (Signed)
[place] Nurse wound follow up Wound type: surgical  Measurement:  Did not measure today Wound bed: clean, moist, subcutaneous tissue  Drainage (amount, consistency, odor) clean, no odor Periwound: intact  Dressing procedure/placement/frequency: Removed old NPWT dressing Filled wound with 1 piece of black foam. Sealed NPWT dressing at 175mm HG Patient received IV pain medication per bedside nurse prior to dressing change Patient tolerated procedure well, she was recently extubated Claiborne Billings PA from [place] in the room for assessment of the wound   [place] will continue to provide NPWT dressing changed due to the complexity of the dressing change: close proximity to the stoma.   [place] Nurse ostomy follow up Stoma type/location: RUQ, end ileostomy Stomal assessment/size: 1 1/4" oval shaped Peristomal assessment: intact  Treatment options for stomal/peristomal skin: using 2" skin barrier ring to aid in seal. Patient has some dips in her abdominal topography at 3 and 9 oclock  Output liquid green Ostomy pouching: 1pc. Flex convex with 2" barrier ring   Education provided:  Patient had 2 different ostomies as a child. She does recall some things related to the care  Explained role of ostomy nurse and creation of stoma  Demonstrated pouch change (cutting new skin barrier, measuring stoma, cleaning peristomal skin and stoma, use of barrier ring) Patient is groggy and was just extubated Education on emptying when 1/3 to 1/2 full and how to empty  Discussed frequency of pouch change with patient and explained rationale for flex convex and barrier ring.  She lives at home with her husband, unclear if he will be involved in her care.  Enrolled patient in [place] Start Discharge program: No   WOC nurse will follow along with you for ostomy care/teaching and support with the NPWT dressing.     Thanks  Daquane Aguilar [employer], RN,CWOCN, CNS, [place] [phone])

## 2018-07-28 NOTE — Progress Notes (Signed)
NAME:  Hailey Hughes, MRN:  [id number], DOB:  Feb 15, 1940, LOS: 6 ADMISSION DATE:  07/22/2018, CONSULTATION DATE: 07/22/2018 REFERRING MD: Francia Greaves MD, CHIEF COMPLAINT: Colitis  Brief History   Patient is an 78 year old white female admitted the hospital 6/9 with CT scan evidence of colitis transferred to the ICU  for septic shock Underwent total colectomy for ischemic colitis  Past Medical History  Prior multiple abdominal surgeries, hypothyroidism, type 2 diabetes rheumatoid arthritis, obstructive sleep apnea  Significant Hospital Events   Admission 07/22/2018 transferred to ICU for hypotension 6/10: Received multiple boluses of IV crystalloid remained hypotensive requiring phenylephrine infusion.  Started on vasopressin on top of phenylephrine, central line placed.  Patient did have episode of vomiting.  Developed worsening work of breathing, desaturation, and worsening metabolic acidosis Ultimately required intubation.  Lactic acid initially slightly improved from peak of 5.4 down to 3.5. having exquisite abdominal pain, surgical services consulted->went for emergent exploratory lap, found to have ischemic colitis and possibly ischemic GB she underwent total colectomy w/ ileostomy placement as well as cholecystectomy. She returned to the ICU w/ three vasoactive gtts and bicarb gtt doubled. PCCM asked to resume care.  6/11: Acid-base improving, vasoactive drip requirements decreased, chest x-ray worse with worsening airspace disease and right effusion, decreasing IV fluid rate PEEP adjusted up to facilitate weaning FiO2 starting TNA 6/12.  Vasopressin off.  Weaning norepinephrine to off.  Starting diuresis.  Continuing antibiotics, TNA, and supportive care.  Discontinuing bicarbonate infusion.  Overall improving working towards pressure support ventilation 6/13 still requiring PEEP of 8, 50% 6/14 Concern for aspiration of bilious secretions overnight with increasing hypoxia to 70% FiO2, remains  on PEEP of 8  Consults:  Critical care 6/9 General surgery consulted 6/10  Procedures:  ETT 6/10 >> CVL left subclavian 6/10 >> Arterial line 6/10  Significant Diagnostic Tests:    Micro Data:  MRSA screen 6/9: neg BCX2 6/9>>>ng COVID-19 6/9: neg Urine culture 6/9: Klebsiella pneumoniae and sensitive to Rocephin  Antimicrobials:  Zosyn 6/9-6/13 Rocephin 6/13 >>  Interim history/subjective:   Remains critically ill, intubated, on PEEP of 8, Defervesced   Objective   Blood pressure (!) 142/42, pulse 79, temperature 98.9 F (37.2 C), temperature source Oral, resp. rate 17, height 5\' 2"  (1.575 m), weight 108.5 kg, SpO2 94 %.    Vent Mode: PSV;CPAP FiO2 (%):  [45 %-50 %] 45 % Set Rate:  [14 bmp] 14 bmp Vt Set:  [400 mL] 400 mL PEEP:  [8 cmH20] 8 cmH20 Pressure Support:  [8 cmH20] 8 cmH20 Plateau Pressure:  [16 cmH20-21 cmH20] 16 cmH20   Intake/Output Summary (Last 24 hours) at 07/28/2018 0945 Last data filed at 07/28/2018 7510 Gross per 24 hour  Intake 210 ml  Output 1375 ml  Net -1165 ml   Filed Weights   07/22/18 0148 07/24/18 1132  Weight: 90.7 kg 108.5 kg   Examination: Elderly lady, obese, no distress, orally intubated Mild pallor, no icterus Decreased breath sounds bilateral, no rhonchi S1-S2 regular Abdomen is soft, nontender, serous drainage in JP drain Extremities shows no clubbing, has 1+ edema Awake interactive, nonfocal   Chest x-ray 6/15 personally reviewed which shows slight increase in bilateral infiltrates  Resolved Hospital Problem list   Hyperkalemia Elevated Lipase (normalized 6/12) Non-gap and non-anion gap metabolic acidosis Lactic acidosis  Assessment & Plan:    Acute Hypoxemic respiratory failure -Some concern for aspiration 6/13 overnight -Pleural effusions -  clearing with diuresis -She is weaning well on pressure support  8/8, decreased to 5/5 and if she tolerates then proceed with extubation, she may need high flow oxygen    Septic shock secondary to ischemic colitis and bacterial translocation Klebsiella UTI -Body fluid culture negative -urine positive for Klebsiella -ct Rocephin-plan total 7-10  Days   Acute kidney injury -baseline cr 1.3 in 2015 -Renal dose medications, holding benazepril -Strict input output -Trend electrolytes -hold lasix  Protein calorie malnutrition, mild --Continue TNA -Can start clear liquids once extubated  Hypertension-okay to use labetalol or hydralazine as needed  Diabetes SSI  Hypothyroidism -Continue Synthroid     Best practice:  Diet: NPO, on TPN Pain/Anxiety/Delirium protocol (if indicated): na VAP protocol (if indicated): na DVT prophylaxis: scd GI prophylaxis: protonix Glucose control: sliding scale Mobility: bed Code Status: full  The patient is critically ill with multiple organ systems failure and requires high complexity decision making for assessment and support, frequent evaluation and titration of therapies, application of advanced monitoring technologies and extensive interpretation of multiple databases. Critical Care Time devoted to patient care services described in this note independent of APP/resident  time is 34 minutes.    Cyril Mourning MD. Tonny Bollman. [place] Pager [phone] If no response call [phone]   07/28/2018

## 2018-07-28 NOTE — Progress Notes (Signed)
Patient ID: Hailey Hughes, female   DOB: March 20, 1940, 78 y.o.   MRN: [id number]    5 Days Post-Op  Subjective: Patient nods yes to some pain in her abdomen.  Appears to have started having some bowel function overnight.  RN states she is having a lot of serous output from her JP drain.  Minimal NGT output.  Objective: Vital signs in last 24 hours: Temp:  [97.9 F (36.6 C)-99.7 F (37.6 C)] 98.9 F (37.2 C) (06/15 0800) Pulse Rate:  [59-81] 75 (06/15 0800) Resp:  [4-22] 22 (06/15 0800) BP: (118-201)/(27-54) 159/52 (06/15 0800) SpO2:  [93 %-98 %] 93 % (06/15 0800) FiO2 (%):  [45 %-70 %] 45 % (06/15 0800) Last BM Date: (OSTOMY )  Intake/Output from previous day: 06/14 0701 - 06/15 0700 In: 110 [I.V.:10] Out: 1425 [Urine:450; Emesis/NG output:250; Drains:475; Stool:250] Intake/Output this shift: Total I/O In: 100 [IV Piggyback:100] Out: 100 [Drains:100]  PE: Heart: regular Lungs: CTAB, weaning well on vent Abd: soft, good BS, ileostomy with bilious outupt, midline wound with VAC in place.  Minimal output in cannister.  JP drain full with serous fluid. (475cc total in last 24hrs)  NGT with only 250cc in last 24 hrs  Lab Results:  Recent Labs    07/26/18 0415 07/28/18 0352  WBC 12.9* 13.7*  HGB 9.0* 8.6*  HCT 29.2* 28.1*  PLT 99* 91*   BMET Recent Labs    07/27/18 0335 07/28/18 0352  NA 144 147*  K 4.0 4.1  CL 107 106  CO2 31 31  GLUCOSE 212* 154*  BUN 62* 76*  CREATININE 2.21* 2.16*  CALCIUM 8.2* 8.4*   PT/INR No results for input(s): LABPROT, INR in the last 72 hours. CMP     Component Value Date/Time   NA 147 (H) 07/28/2018 0352   K 4.1 07/28/2018 0352   CL 106 07/28/2018 0352   CO2 31 07/28/2018 0352   GLUCOSE 154 (H) 07/28/2018 0352   BUN 76 (H) 07/28/2018 0352   CREATININE 2.16 (H) 07/28/2018 0352   CALCIUM 8.4 (L) 07/28/2018 0352   PROT 4.4 (L) 07/28/2018 0352   ALBUMIN 1.7 (L) 07/28/2018 0352   AST 26 07/28/2018 0352   ALT 20 07/28/2018  0352   ALKPHOS 92 07/28/2018 0352   BILITOT 0.7 07/28/2018 0352   GFRNONAA 21 (L) 07/28/2018 0352   GFRAA 24 (L) 07/28/2018 0352   Lipase     Component Value Date/Time   LIPASE 12 07/25/2018 0500       Studies/Results: Dg Chest Port 1 View  Result Date: 07/28/2018 CLINICAL DATA:  Acute respiratory failure. EXAM: PORTABLE CHEST 1 VIEW COMPARISON:  07/27/2018.  07/26/2018. FINDINGS: Endotracheal tube, left subclavian line, NG tube in stable position. Heart size normal. Bilateral pulmonary infiltrates/edema and small bilateral pleural effusions again noted. These findings have progressed from prior exam. IMPRESSION: 1.  Lines and tubes in stable position. 2. Progressive bilateral pulmonary infiltrates/edema small bilateral pleural effusions Electronically Signed   By: Marcello Moores  Register   On: 07/28/2018 07:57   Dg Chest Port 1 View  Result Date: 07/27/2018 CLINICAL DATA:  Shortness of breath EXAM: PORTABLE CHEST 1 VIEW COMPARISON:  07/26/2018 FINDINGS: Support devices are in stable position. Bilateral perihilar and lower lobe airspace opacities, slightly improved. Heart is normal size. Suspect small effusions. No acute bony abnormality. IMPRESSION: Bilateral lower lobe airspace opacities and perihilar opacities, slightly improved since prior study. Suspect small effusions. Electronically Signed   By: Rolm Baptise [name]  On: 07/27/2018 03:27    Anti-infectives: Anti-infectives (From admission, onward)   Start     Dose/Rate Route Frequency Ordered Stop   07/26/18 1400  piperacillin-tazobactam (ZOSYN) IVPB 3.375 g  Status:  Discontinued     3.375 g 12.5 mL/hr over 240 Minutes Intravenous Every 6 hours 07/26/18 0906 07/26/18 1339   07/26/18 1400  cefTRIAXone (ROCEPHIN) 1 g in sodium chloride 0.9 % 100 mL IVPB     1 g 200 mL/hr over 30 Minutes Intravenous Every 24 hours 07/26/18 1339 07/30/18 1359   07/23/18 0200  piperacillin-tazobactam (ZOSYN) IVPB 2.25 g  Status:  Discontinued     2.25 g  100 mL/hr over 30 Minutes Intravenous Every 6 hours 07/22/18 2248 07/26/18 0906   07/22/18 1800  piperacillin-tazobactam (ZOSYN) IVPB 3.375 g  Status:  Discontinued     3.375 g 12.5 mL/hr over 240 Minutes Intravenous Every 8 hours 07/22/18 0953 07/22/18 2248   07/22/18 1000  piperacillin-tazobactam (ZOSYN) IVPB 3.375 g     3.375 g 100 mL/hr over 30 Minutes Intravenous  Once 07/22/18 0953 07/22/18 1239   07/22/18 0745  ciprofloxacin (CIPRO) IVPB 400 mg     400 mg 200 mL/hr over 60 Minutes Intravenous  Once 07/22/18 0739 07/22/18 0921   07/22/18 0745  metroNIDAZOLE (FLAGYL) IVPB 500 mg     500 mg 100 mL/hr over 60 Minutes Intravenous  Once 07/22/18 0739 07/22/18 1024       Assessment/Plan Type 2 diabetes Hypothyroid Rheumatoid arthritis Struct of sleep apnea Morbid obesity BMI 36.58 AKI - Cr trending down 2.16, good UOP Malnutrition - prealbumin <5, on TPN Klebsiella UTI VDRF, per CCM, weaning today well per RN Thrombocytopenia - plts down to 91K today  Septic shock Ischemic colitis, acute cholecystitis POD 5, S/p Exploratory laparotomy, total abdominal colectomy, [name], cholecystectomy - 6/10 - Dr. Derrell Lolling -cont VAC changes, will look at wound when VAC changed -having good bowel function currently.  Can start TFs, except she is weaning well with the hopes of extubation.  Will hold on TFs to determine what happens with her vent today.  She may have clear liquids if she is extubated if ok with CCM given she may have aspirated a couple of days ago, ? If they feel she needs swallow eval prior to diet. -cont JP but only putting out serous fluid for now -on zosyn  ID - zosyn 6/9>>DC, Rocephin x4 days for UTI FEN - IVF, TPN, NPO VTE - SCDs, ok for sq heparin or lovenox from surgical standpoint, but plts 91K Foley - in place Follow up - Dr. Marnette Burgess - son Onalee Hua [phone], husband Henreitta Cea [phone]   LOS: 6 days    Letha Cape , [facility]  Surgery 07/28/2018, 8:52 AM Pager: [phone]

## 2018-07-28 NOTE — Procedures (Signed)
Extubation Procedure Note  Patient Details:   Name: Hailey Hughes DOB: October 18, 1940 MRN: [id number]   Airway Documentation:    Vent end date: (S) 07/28/18 Vent end time: (S) 1008   Evaluation  O2 sats: stable throughout Complications: No apparent complications Patient did tolerate procedure well. Bilateral Breath Sounds: Diminished   Yes  Hailey Hughes 07/28/2018, 10:09 AM  Positive cuff leak pre extubation. Placed on 4 lpm post extubation (on 45% on vent).

## 2018-07-29 ENCOUNTER — Inpatient Hospital Stay (HOSPITAL_COMMUNITY): Payer: Medicare Other

## 2018-07-29 ENCOUNTER — Inpatient Hospital Stay: Payer: Self-pay

## 2018-07-29 DIAGNOSIS — R6521 Severe sepsis with septic shock: Secondary | ICD-10-CM

## 2018-07-29 DIAGNOSIS — J9600 Acute respiratory failure, unspecified whether with hypoxia or hypercapnia: Secondary | ICD-10-CM

## 2018-07-29 DIAGNOSIS — A419 Sepsis, unspecified organism: Secondary | ICD-10-CM

## 2018-07-29 LAB — BASIC METABOLIC PANEL
Anion gap: 9 (ref 5–15)
BUN: 93 mg/dL — ABNORMAL HIGH (ref 8–23)
CO2: 27 mmol/L (ref 22–32)
Calcium: 8.5 mg/dL — ABNORMAL LOW (ref 8.9–10.3)
Chloride: 114 mmol/L — ABNORMAL HIGH (ref 98–111)
Creatinine, Ser: 2.12 mg/dL — ABNORMAL HIGH (ref 0.44–1.00)
GFR calc Af Amer: 25 mL/min — ABNORMAL LOW (ref >60)
GFR calc non Af Amer: 21 mL/min — ABNORMAL LOW (ref >60)
Glucose, Bld: 96 mg/dL (ref 70–99)
Potassium: 4.1 mmol/L (ref 3.5–5.1)
Sodium: 150 mmol/L — ABNORMAL HIGH (ref 135–145)

## 2018-07-29 LAB — BLOOD GAS, ARTERIAL
Acid-Base Excess: 2 mmol/L (ref 0.0–2.0)
Acid-Base Excess: 0.3 mmol/L (ref 0.0–2.0)
Bicarbonate: 27.1 mmol/L (ref 20.0–28.0)
Bicarbonate: 25.8 mmol/L (ref 20.0–28.0)
Drawn by: 422461
Drawn by: 257881
O2 Content: 15 L/min
O2 Content: 15 L/min
O2 Saturation: 92.1 %
O2 Saturation: 93 %
Patient temperature: 101.1
Patient temperature: 98.6
pCO2 arterial: 50.6 mmHg — ABNORMAL HIGH (ref 32.0–48.0)
pCO2 arterial: 46.8 mmHg (ref 32.0–48.0)
pH, Arterial: 7.356 (ref 7.350–7.450)
pH, Arterial: 7.36 (ref 7.350–7.450)
pO2, Arterial: 71.4 mmHg — ABNORMAL LOW (ref 83.0–108.0)
pO2, Arterial: 69.5 mmHg — ABNORMAL LOW (ref 83.0–108.0)

## 2018-07-29 LAB — CBC
HCT: 32.9 % — ABNORMAL LOW (ref 36.0–46.0)
Hemoglobin: 9.6 g/dL — ABNORMAL LOW (ref 12.0–15.0)
MCH: 28.2 pg (ref 26.0–34.0)
MCHC: 29.2 g/dL — ABNORMAL LOW (ref 30.0–36.0)
MCV: 96.5 fL (ref 80.0–100.0)
Platelets: 122 10*3/uL — ABNORMAL LOW (ref 150–400)
RBC: 3.41 MIL/uL — ABNORMAL LOW (ref 3.87–5.11)
RDW: 16.2 % — ABNORMAL HIGH (ref 11.5–15.5)
WBC: 9.4 10*3/uL (ref 4.0–10.5)
nRBC: 0.3 % — ABNORMAL HIGH (ref 0.0–0.2)

## 2018-07-29 LAB — GLUCOSE, CAPILLARY
Glucose-Capillary: 104 mg/dL — ABNORMAL HIGH (ref 70–99)
Glucose-Capillary: 105 mg/dL — ABNORMAL HIGH (ref 70–99)
Glucose-Capillary: 108 mg/dL — ABNORMAL HIGH (ref 70–99)
Glucose-Capillary: 164 mg/dL — ABNORMAL HIGH (ref 70–99)
Glucose-Capillary: 198 mg/dL — ABNORMAL HIGH (ref 70–99)
Glucose-Capillary: 58 mg/dL — ABNORMAL LOW (ref 70–99)
Glucose-Capillary: 90 mg/dL (ref 70–99)

## 2018-07-29 LAB — LACTIC ACID, PLASMA: Lactic Acid, Venous: 1.6 mmol/L (ref 0.5–1.9)

## 2018-07-29 LAB — TROPONIN I: Troponin I: 0.03 ng/mL (ref <0.03)

## 2018-07-29 MED ORDER — NOREPINEPHRINE BITARTRATE 1 MG/ML IV SOLN: 0.0000 ug/min | INTRAVENOUS | Status: DC

## 2018-07-29 MED ORDER — NOREPINEPHRINE BITARTRATE 1 MG/ML IV SOLN
0.0000 ug/min | INTRAVENOUS | Status: DC
  Filled 2018-07-29: qty 4

## 2018-07-29 MED ORDER — CHLORHEXIDINE GLUCONATE CLOTH 2 % EX PADS
6.0000 | MEDICATED_PAD | Freq: Every day | CUTANEOUS | Status: DC
  Administered 2018-07-29 – 2018-08-03 (×6): 6 via TOPICAL

## 2018-07-29 MED ORDER — SODIUM CHLORIDE 0.9 % IV SOLN
2.0000 g | INTRAVENOUS | Status: DC
  Administered 2018-07-29: 2 g via INTRAVENOUS
  Filled 2018-07-29: qty 2

## 2018-07-29 MED ORDER — SODIUM CHLORIDE 0.9% FLUSH
10.0000 mL | Freq: Two times a day (BID) | INTRAVENOUS | Status: DC
  Administered 2018-07-29: 10 mL
  Administered 2018-07-30 (×2): 30 mL
  Administered 2018-07-31: 10 mL
  Administered 2018-07-31: 20 mL
  Administered 2018-08-01: 30 mL
  Administered 2018-08-01 – 2018-08-08 (×14): 10 mL
  Administered 2018-08-08: 30 mL
  Administered 2018-08-09 – 2018-08-19 (×13): 10 mL

## 2018-07-29 MED ORDER — SODIUM CHLORIDE 0.9 % IV SOLN
1.0000 g | Freq: Two times a day (BID) | INTRAVENOUS | Status: DC
  Administered 2018-07-29 – 2018-08-03 (×10): 1 g via INTRAVENOUS
  Filled 2018-07-29 (×11): qty 1

## 2018-07-29 MED ORDER — DEXTROSE 50 % IV SOLN
INTRAVENOUS | Status: AC
  Administered 2018-07-29: 09:00:00 25 mL
  Filled 2018-07-29: qty 50

## 2018-07-29 MED ORDER — DEXTROSE 5 % IV SOLN
INTRAVENOUS | Status: DC
  Administered 2018-07-29 – 2018-07-30 (×3): via INTRAVENOUS

## 2018-07-29 MED ORDER — VASOPRESSIN 20 UNIT/ML IV SOLN
0.0300 [IU]/min | INTRAVENOUS | Status: DC
  Administered 2018-07-29: 12:00:00 0.03 [IU]/min via INTRAVENOUS
  Filled 2018-07-29: qty 2

## 2018-07-29 MED ORDER — HYDROMORPHONE HCL 1 MG/ML IJ SOLN
0.5000 mg | INTRAMUSCULAR | Status: DC | PRN
  Administered 2018-07-29 – 2018-08-17 (×31): 1 mg via INTRAVENOUS
  Filled 2018-07-29 (×33): qty 1

## 2018-07-29 MED ORDER — TRAVASOL 10 % IV SOLN
INTRAVENOUS | Status: AC
  Administered 2018-07-29: 18:00:00 via INTRAVENOUS
  Filled 2018-07-29: qty 993.6

## 2018-07-29 MED ORDER — ACETAMINOPHEN 650 MG RE SUPP
650.0000 mg | RECTAL | Status: DC | PRN
  Administered 2018-07-29: 650 mg via RECTAL
  Filled 2018-07-29: qty 1

## 2018-07-29 MED ORDER — NOREPINEPHRINE 4 MG/250ML-% IV SOLN
0.0000 ug/min | INTRAVENOUS | Status: DC
  Administered 2018-07-29: 4 ug/min via INTRAVENOUS
  Administered 2018-07-29: 40 ug/min via INTRAVENOUS
  Filled 2018-07-29 (×2): qty 250

## 2018-07-29 MED ORDER — SODIUM CHLORIDE 0.9% FLUSH
10.0000 mL | INTRAVENOUS | Status: DC | PRN
  Administered 2018-08-07: 10 mL
  Administered 2018-08-15: 30 mL
  Administered 2018-08-18: 10 mL
  Filled 2018-07-29 (×3): qty 40

## 2018-07-29 MED ORDER — VASOPRESSIN 20 UNIT/ML IV SOLN
0.2000 [IU]/min | INTRAVENOUS | Status: DC
  Filled 2018-07-29: qty 5

## 2018-07-29 MED ORDER — NOREPINEPHRINE 16 MG/250ML-% IV SOLN
0.0000 ug/min | INTRAVENOUS | Status: DC
  Administered 2018-07-29: 40 ug/min via INTRAVENOUS
  Filled 2018-07-29 (×2): qty 250

## 2018-07-29 NOTE — Progress Notes (Signed)
Two RTs present during procedure. Attempted two sticks for an arterial line. Pt refused procedure any further. RN aware.

## 2018-07-29 NOTE — Progress Notes (Addendum)
....  eLink Physician-Brief Progress Note Patient Name: Hailey Hughes DOB: 09-29-1940 MRN: [id number]   Date of Service  07/29/2018  HPI/Events of Note  Notified that patient continues to have increased biliary secretions and coughing up. Switched to NRB mask and very comfortable since she is a mouth breather, denies any complaints  eICU Interventions  Will place NG tube CXR and KUB ordered     Intervention Category Major Interventions: Respiratory failure - evaluation and management  Margaretmary Lombard 07/29/2018, 9:52 PM   11:07 PM  Reviewed X ray Will place NG to intermittent suction Comfortable and no distress  2:56 AM  Have seen her on camera a couple times Comfortable O2 99 on NRB mask Spoke with RN Switched her back to HFNC, no distress Continue to monitor

## 2018-07-29 NOTE — Progress Notes (Signed)
eLink Physician-Brief Progress Note Patient Name: Hailey Hughes DOB: 03/26/1940 MRN: [id number]   Date of Service  07/29/2018  HPI/Events of Note  Notified of fever T 101.3, change in mental status. Increasing O2 requirement since after a possible aspiration earlier. Also hypotensive with SBP 60s.  She did receive 2 doses of dilaudid for abdominal pain the whole night. Accucheck 105  eICU Interventions   Ordered a 500 cc NS bolus  Get blood cultures  CXR and KUB ordered  Escalate antibiotics to include gram negative and anaerobic coverage  ABG pending but would not be an ideal candidate for NIV if need be due to recent abdominal surgery  Head CT ordered given acute change     Intervention Category Major Interventions: Hypotension - evaluation and management Intermediate Interventions: Change in mental status - evaluation and management  Shona Needles Elliannah Wayment 07/29/2018, 4:26 AM

## 2018-07-29 NOTE — Progress Notes (Signed)
CRITICAL VALUE ALERT  Critical Value:  Troponin 0.03  Date & Time Notied: 06/16 0119  Provider Notified: Marni Griffon BP  Orders Received/Actions taken: No new orders

## 2018-07-29 NOTE — Progress Notes (Signed)
OT Cancellation Note  Patient Details Name: FLORENE BRILL MRN: [id number] DOB: March 04, 1940   Cancelled Treatment:    Reason Eval/Treat Not Completed: Medical issues which prohibited therapy  Will check back tomorrow.  [place] 07/29/2018, 12:50 PM  Lesle Chris, OTR/L Acute Rehabilitation Services [phone] [facility] pager [phone] office 07/29/2018

## 2018-07-29 NOTE — Evaluation (Addendum)
SLP Cancellation Note  Patient Details Name: Hailey Hughes MRN: [id number] DOB: September 10, 1940   Cancelled treatment:       Reason Eval/Treat Not Completed: Other (comment)(pt in the middle of a sterile procedure, 3rd attempt to see this pt, will follow up next date for po readiness.  Given pt imaging study results, concern for aspiration is present *note bilious secretions likely aspirated 6/15)  Pt intubated x 5 days prior to extubation on 6/15 and thus may have reversible acute dysphagia.  She has TPN in place for nutritional support.     Macario Golds 07/29/2018, 5:28 PM   Luanna Salk, [facility] SLP [place] Pager [phone] Office [phone]

## 2018-07-29 NOTE — Progress Notes (Addendum)
PHARMACY NOTE -  ANTIBIOTIC RENAL DOSE ADJUSTMENT   Patient has been initiated on meropenem for PNA. SCr 2.16, estimated CrCl 24.1 ml/min Plan: Meropenem 1 gm IV q12  Eudelia Bunch, Pharm.D 07/29/2018 4:44 AM

## 2018-07-29 NOTE — Progress Notes (Signed)
PT Cancellation Note  Patient Details Name: Hailey Hughes MRN: [id number] DOB: [birth year]   Cancelled Treatment:    Reason Eval/Treat Not Completed: Medical issues which prohibited therapy RN reports pt not medically ready today and has not been able to receive pain meds.  Will check back as schedule permits.   Alfrieda Tarry,KATHrine E 07/29/2018, 11:54 AM Carmelia Bake, PT, DPT Acute Rehabilitation Services Office: [phone] Pager: [phone]

## 2018-07-29 NOTE — Progress Notes (Signed)
Patient ID: Hailey Hughes, female   DOB: Apr 27, 1940, 78 y.o.   MRN: [id number]    6 Days Post-Op  Subjective: Events from overnight noted.  Patient now on 15L HFNC with sats in low 90s.  Speech is still somewhat slurred but according to nurse her mental status is coming around.  She continues to complain of awful abdominal pain.  Back on pressors overnight as well.  Temp of 101.1.  CT head overnight negative.  Objective: Vital signs in last 24 hours: Temp:  [98.1 F (36.7 C)-101.1 F (38.4 C)] 101.1 F (38.4 C) (06/16 0345) Pulse Rate:  [69-106] 82 (06/16 0630) Resp:  [10-30] 23 (06/16 0630) BP: (72-200)/(37-115) 115/45 (06/16 0630) SpO2:  [88 %-96 %] 95 % (06/16 0630) FiO2 (%):  [45 %] 45 % (06/15 0900) Last BM Date: (ostomy)  Intake/Output from previous day: 06/15 0701 - 06/16 0700 In: 1408.4 [I.V.:935.2; IV Piggyback:373.2] Out: 2020 [Urine:1475; Drains:545] Intake/Output this shift: Total I/O In: -  Out: 550 [Urine:450; Drains:100]  PE: Gen: weak appearing with some mumbled speech Heart: regular Lungs: decrease breath sounds, somewhat increase WOB but not significant.  91-93% sats on 15L HFNC Abd: soft, but seems more tender than I would expected.  Still with some BS.  Less ostomy output today.  JP drain initially with serous output, but once tubing stripped some more cloudy drainage was present.  Midline VAC in place with minimal output Neuro: seems neuro intact.  Her speech is mumbled but cranial nerves are grossly intact.  Can barely raise her arms (she says secondary to abdominal pain).  Equal strength in her lower extremities.  Barely able to lift legs off of bed, but this is bilateral as well.  Lab Results:  Recent Labs    07/28/18 0352 07/29/18 0430  WBC 13.7* 9.4  HGB 8.6* 9.6*  HCT 28.1* 32.9*  PLT 91* 122*   BMET Recent Labs    07/28/18 0352 07/29/18 0430  NA 147* 150*  K 4.1 4.1  CL 106 114*  CO2 31 27  GLUCOSE 154* 96  BUN 76* 93*   CREATININE 2.16* 2.12*  CALCIUM 8.4* 8.5*   PT/INR No results for input(s): LABPROT, INR in the last 72 hours. CMP     Component Value Date/Time   NA 150 (H) 07/29/2018 0430   K 4.1 07/29/2018 0430   CL 114 (H) 07/29/2018 0430   CO2 27 07/29/2018 0430   GLUCOSE 96 07/29/2018 0430   BUN 93 (H) 07/29/2018 0430   CREATININE 2.12 (H) 07/29/2018 0430   CALCIUM 8.5 (L) 07/29/2018 0430   PROT 4.4 (L) 07/28/2018 0352   ALBUMIN 1.7 (L) 07/28/2018 0352   AST 26 07/28/2018 0352   ALT 20 07/28/2018 0352   ALKPHOS 92 07/28/2018 0352   BILITOT 0.7 07/28/2018 0352   GFRNONAA 21 (L) 07/29/2018 0430   GFRAA 25 (L) 07/29/2018 0430   Lipase     Component Value Date/Time   LIPASE 12 07/25/2018 0500       Studies/Results: Dg Abd 1 View  Result Date: 07/29/2018 CLINICAL DATA:  Abdominal distension. EXAM: ABDOMEN - 1 VIEW COMPARISON:  Abdominal radiographs 07/23/2018. CT of the abdomen and pelvis 07/22/2018. FINDINGS: Moderate prominence of small bowel loops are present following total colectomy and cholecystectomy. Surgical drain is in place. Degenerative changes are noted in the lumbar spine. IMPRESSION: 1. Mild prominence of small bowel loops without obstruction likely represents mild ileus. 2. Total colectomy. Electronically Signed   By: Harrell Gave  Mattern [name]   On: 07/29/2018 05:26   Ct Head Wo Contrast  Result Date: 07/29/2018 CLINICAL DATA:  Fever.  Acute mental status changes. EXAM: CT HEAD WITHOUT CONTRAST TECHNIQUE: Contiguous axial images were obtained from the base of the skull through the vertex without intravenous contrast. COMPARISON:  None. FINDINGS: Brain: Mild atrophy and white matter changes are within normal limits for age. No acute infarct, hemorrhage, or mass lesion is present. The ventricles are of normal size. No significant extraaxial fluid collection is present. Vascular: Atherosclerotic changes are noted in the cavernous internal carotid arteries and at the dural  margin of both vertebral arteries. There is no hyperdense vessel. Skull: Calvarium is intact. No focal lytic or blastic lesions are present. No significant extra cranial soft tissue lesions are present. Sinuses/Orbits: The paranasal sinuses and mastoid air cells are clear. The globes and orbits are within normal limits. IMPRESSION: 1. Normal CT appearance of the brain for age. 2. Diffuse atherosclerotic calcifications. Electronically Signed   By: Marin Robertshristopher  Mattern [name]   On: 07/29/2018 06:31   Dg Chest Port 1 View  Result Date: 07/29/2018 CLINICAL DATA:  Hypoxia. EXAM: PORTABLE CHEST 1 VIEW COMPARISON:  One-view chest x-ray 07/28/2018 FINDINGS: Heart size is normal. The patient has been extubated. Left subclavian line is stable. Lung volumes remain low. Aeration of both lungs is slightly improved. Bibasilar airspace opacities remain. Left greater than right pleural effusion is present. IMPRESSION: 1. Improving aeration the bilateral lower lobe airspace disease. Disease remains worse on the left. 2. Interval extubation and removal of NG tube. 3. Left subclavian line is stable. 4. Persistent low lung volumes. Electronically Signed   By: Marin Robertshristopher  Mattern [name]   On: 07/29/2018 05:35   Dg Chest Port 1 View  Result Date: 07/28/2018 CLINICAL DATA:  Acute respiratory failure. EXAM: PORTABLE CHEST 1 VIEW COMPARISON:  07/27/2018.  07/26/2018. FINDINGS: Endotracheal tube, left subclavian line, NG tube in stable position. Heart size normal. Bilateral pulmonary infiltrates/edema and small bilateral pleural effusions again noted. These findings have progressed from prior exam. IMPRESSION: 1.  Lines and tubes in stable position. 2. Progressive bilateral pulmonary infiltrates/edema small bilateral pleural effusions Electronically Signed   ByMaisie Fus: Thomas  Register   On: 07/28/2018 07:57    Anti-infectives: Anti-infectives (From admission, onward)   Start     Dose/Rate Route Frequency Ordered Stop   07/29/18 0600   meropenem (MERREM) 1 g in sodium chloride 0.9 % 100 mL IVPB     1 g 200 mL/hr over 30 Minutes Intravenous Every 12 hours 07/29/18 0457     07/29/18 0500  ceFEPIme (MAXIPIME) 2 g in sodium chloride 0.9 % 100 mL IVPB  Status:  Discontinued     2 g 200 mL/hr over 30 Minutes Intravenous Every 24 hours 07/29/18 0436 07/29/18 0457   07/26/18 1400  piperacillin-tazobactam (ZOSYN) IVPB 3.375 g  Status:  Discontinued     3.375 g 12.5 mL/hr over 240 Minutes Intravenous Every 6 hours 07/26/18 0906 07/26/18 1339   07/26/18 1400  cefTRIAXone (ROCEPHIN) 1 g in sodium chloride 0.9 % 100 mL IVPB  Status:  Discontinued     1 g 200 mL/hr over 30 Minutes Intravenous Every 24 hours 07/26/18 1339 07/29/18 0436   07/23/18 0200  piperacillin-tazobactam (ZOSYN) IVPB 2.25 g  Status:  Discontinued     2.25 g 100 mL/hr over 30 Minutes Intravenous Every 6 hours 07/22/18 2248 07/26/18 0906   07/22/18 1800  piperacillin-tazobactam (ZOSYN) IVPB 3.375 g  Status:  Discontinued  3.375 g 12.5 mL/hr over 240 Minutes Intravenous Every 8 hours 07/22/18 0953 07/22/18 2248   07/22/18 1000  piperacillin-tazobactam (ZOSYN) IVPB 3.375 g     3.375 g 100 mL/hr over 30 Minutes Intravenous  Once 07/22/18 0953 07/22/18 1239   07/22/18 0745  ciprofloxacin (CIPRO) IVPB 400 mg     400 mg 200 mL/hr over 60 Minutes Intravenous  Once 07/22/18 0739 07/22/18 0921   07/22/18 0745  metroNIDAZOLE (FLAGYL) IVPB 500 mg     500 mg 100 mL/hr over 60 Minutes Intravenous  Once 07/22/18 0739 07/22/18 1024       Assessment/Plan Type 2 diabetes Hypothyroid Rheumatoid arthritis Struct of sleep apnea Morbid obesity BMI 36.58 AKI - Cr trending down 2.12, good UOP Malnutrition - prealbumin <5, on TPN Klebsiella UTI VDRF - extubated yesterday, but back on 15L HFNC with sats in low 90s Thrombocytopenia - plts improved to 122K today  Septic shock Ischemic colitis, acute cholecystitis POD 6, S/pExploratory laparotomy, total abdominal  colectomy, [name], cholecystectomy-6/10-Dr. Derrell Lolling -cont VAC changes, wound looked good yesterday with VAC change -some concerns of choking on her own secretions overnight with AMS.  Swallow study ordered.  Patient made NPO for now. -cont JP as staple line ok at time of surgery but path revealed some ischemia at margin. -on zosyn -given fever overnight, AMS, abdominal pain, and overall change will scan her abdomen to determine if she has a post op complication or source for any of these changes.  D/W Dr. Vassie Loll to update him from surgical standpoint as well.  ID -zosyn 6/9>>DC, Rocephin DC, Merrem 6/16 --> FEN -IVF, TPN, NPO, awaiting swallow eval VTE -SCDs, ok for sq heparin or lovenox from surgical standpoint Foley -in place Follow up -Dr. Marnette Burgess - son Onalee Hua [phone], husband Henreitta Cea [phone]   LOS: 7 days    Letha Cape , [facility] 07/29/2018, 8:19 AM Pager: [phone]

## 2018-07-29 NOTE — Progress Notes (Signed)
Peripherally Inserted Central Catheter/Midline Placement  The IV Nurse has discussed with the patient and/or persons authorized to consent for the patient, the purpose of this procedure and the potential benefits and risks involved with this procedure.  The benefits include less needle sticks, lab draws from the catheter, and the patient may be discharged home with the catheter. Risks include, but not limited to, infection, bleeding, blood clot (thrombus formation), and puncture of an artery; nerve damage and irregular heartbeat and possibility to perform a PICC exchange if needed/ordered by physician.  Alternatives to this procedure were also discussed.  Bard Power PICC patient education guide, fact sheet on infection prevention and patient information card has been provided to patient /or left at bedside.    PICC/Midline Placement Documentation  PICC Triple Lumen 07/29/18 PICC Right Cephalic 40 cm 0 cm (Active)  Indication for Insertion or Continuance of Line Administration of hyperosmolar/irritating solutions (i.e. TPN, Vancomycin, etc.) 07/29/18 1645  Exposed Catheter (cm) 0 cm 07/29/18 1645  Site Assessment Clean;Dry;Intact 07/29/18 1645  Lumen #1 Status Flushed;Blood return noted 07/29/18 1645  Lumen #2 Status Flushed;Blood return noted 07/29/18 1645  Lumen #3 Status Flushed;Blood return noted 07/29/18 1645  Dressing Type Transparent 07/29/18 1645  Dressing Status Clean;Dry;Intact;Antimicrobial disc in place 07/29/18 1645  Dressing Intervention New dressing 07/29/18 1645  Dressing Change Due 08/05/18 07/29/18 1645   Tel consent signed by Foye Clock Albarece 07/29/2018, 4:46 PM

## 2018-07-29 NOTE — Progress Notes (Signed)
PHARMACY - ADULT TOTAL PARENTERAL NUTRITION CONSULT NOTE   Pharmacy Consult for TPN Indication: s/p massive bowel resection  Patient Measurements: Height: 5\' 2"  (157.5 cm) Weight: 239 lb 3.2 oz (108.5 kg) IBW/kg (Calculated) : 50.1 TPN AdjBW (KG): 60.3 Body mass index is 43.75 kg/m.  Insulin Requirements: 10 units moderate scale SSI last 24hr (4 units since current TPN bag hung)  Current Nutrition: NPO  IVF: D5W @ 9ml/hr  Central access: PICC TPN start date: 6/11  ASSESSMENT                                                                                                          HPI: 78 yo female s/p total abdominal colectomy, brooke ileostomy and cholecystecomy on 6/10 to start TPN.   Significant events:   Today, 07/29/18  Glucose (goal CBGs < 150): 58-149; mostly at goal with hypoglycemic reading this morning of 58.  Note hx DM on metformin PTA.  Electrolytes - all WNL except Na slightly elevated at 150.  Renal - SCr 2.12, slowly improving.  Good UOP.  LFTs - AST/ALT WNL  TGs - 225 (6/12), 176 (6/15)  Prealbumin - 5 (6/12), <5 (6/15)  NUTRITIONAL GOALS                                                                                             RD recs: 100g protein/day, 1000-1270 kcal/day - high protein, hypocaloric formula  Custom TPN (high protein, hypocaloric formula) at a new goal rate of 51ml/hr to provide  99g/day protein and 1175 kcal/day.  PLAN                                                                                                                         At 1800 today:  Continue custom TPN high protein hypocaloric formula due to guidelines for critically obese ICU patient - see above for new goals. Continue TPN at goal 60 ml/hr - protein 69g/L, dextrose 10%, and lipid 20g/L  TPN will have standard concentrations of electrolytes except: continue phos at 15, Ca at 10, decrease Na to 30, change Cl:Acet at 1:2  TPN to contain standard multivitamins  daily and  trace elements MWF only due to national shortage  IVF per MD  Continue SSI/CBGs q4h, decrease insulin in TPN to 20 units.  TPN lab panels on Mondays & Thursdays, BMET in am.   Junita Push, PharmD, BCPS 07/29/2018 9:34 AM

## 2018-07-29 NOTE — Progress Notes (Signed)
Around 0330, the patient's blood pressure began to drop. Upon assessment, the patient's mental status had changed from being alert & oriented x4 to altered mental status. The patient still followed simple commands but could not verbalize her needs. The patient was also found to have a temperature of 101.1 and cold to the touch. E-Link was made aware and orders were given for NS bolus, labs, ABG, chest and abdominal x-ray, blood cultures, CT, cefepime, merrem, levophed and carrier fluids of dextrose 5% AT 65mL/hr. Will continue to monitor.

## 2018-07-29 NOTE — Progress Notes (Signed)
eLink Physician-Brief Progress Note Patient Name: Hailey Hughes DOB: [birth year] MRN: [id number]   Date of Service  07/29/2018  HPI/Events of Note  CXR and KUB without concerning findings. Head CT not readh yet but did not see any bleed or infarct. Na 150 trending up. Glucose 96 on TPN  eICU Interventions  Will start D5W at 30 cc/hr for the hypernatremia while patient remains NPO. May need to change TPN formulation     Intervention Category Major Interventions: Electrolyte abnormality - evaluation and management  Judd Lien 07/29/2018, 6:29 AM

## 2018-07-29 NOTE — Progress Notes (Signed)
NAME:  Hailey Hughes, MRN:  [id number], DOB:  [birth year], LOS: 7 ADMISSION DATE:  07/22/2018, CONSULTATION DATE: 07/22/2018 REFERRING MD: Raphael Gibney MD, CHIEF COMPLAINT: Colitis  Brief History   Patient is an 78 year old white female admitted the hospital 6/9 with CT scan evidence of colitis transferred to the ICU  for septic shock Underwent total colectomy for ischemic colitis  Past Medical History  Prior multiple abdominal surgeries, hypothyroidism, type 2 diabetes rheumatoid arthritis, obstructive sleep apnea  Significant Hospital Events   Admission 07/22/2018 transferred to ICU for hypotension 6/10: Received multiple boluses of IV crystalloid remained hypotensive requiring phenylephrine infusion.  Started on vasopressin on top of phenylephrine, central line placed.  Patient did have episode of vomiting.  Developed worsening work of breathing, desaturation, and worsening metabolic acidosis Ultimately required intubation.  Lactic acid initially slightly improved from peak of 5.4 down to 3.5. having exquisite abdominal pain, surgical services consulted->went for emergent exploratory lap, found to have ischemic colitis and possibly ischemic GB she underwent total colectomy w/ ileostomy placement as well as cholecystectomy. She returned to the ICU w/ three vasoactive gtts and bicarb gtt doubled. PCCM asked to resume care.  6/11: Acid-base improving, vasoactive drip requirements decreased, chest x-ray worse with worsening airspace disease and right effusion, decreasing IV fluid rate PEEP adjusted up to facilitate weaning FiO2 starting TNA 6/12.  Vasopressin off.  Weaning norepinephrine to off.  Starting diuresis.  Continuing antibiotics, TNA, and supportive care.  Discontinuing bicarbonate infusion.  Overall improving working towards pressure support ventilation 6/13 still requiring PEEP of 8, 50% 6/14 Concern for aspiration of bilious secretions overnight with increasing hypoxia to 70% FiO2, remains  on PEEP of 8  Consults:  Critical care 6/9 General surgery consulted 6/10  Procedures:  ETT 6/10 >>6/15  CVL left subclavian 6/10 >> Arterial line 6/10  Significant Diagnostic Tests:  Head CT 6/ 15 >> neg  Micro Data:  MRSA screen 6/9: neg BCX2 6/9>>>ng COVID-19 6/9: neg Urine culture 6/9: Klebsiella pneumoniae and sensitive to Rocephin  Antimicrobials:  Zosyn 6/9-6/13 Rocephin 6/13 >>6/16 Meropenem 6/16 >>  Interim history/subjective:   Extubated yesterday and seemed to do well initially Then developed fever and hypotension, now on pressors gradually increased through the night now on Levophed at 40 mics Developed altered mental status head CT was negative, this was attributed to Dilaudid Good urine output   Objective   Blood pressure (!) 115/45, pulse 82, temperature 98.1 F (36.7 C), temperature source Oral, resp. rate (!) 23, height 5\' 2"  (1.575 m), weight 108.5 kg, SpO2 95 %. CVP:  [8 mmHg] 8 mmHg      Intake/Output Summary (Last 24 hours) at 07/29/2018 07/31/2018 Last data filed at 07/29/2018 0827 Gross per 24 hour  Intake 1579.54 ml  Output 2470 ml  Net -890.46 ml   Filed Weights   07/22/18 0148 07/24/18 1132  Weight: 90.7 kg 108.5 kg   Examination: Elderly lady, obese, mild distress, on high flow nasal cannula Mild pallor, no icterus Decreased breath sounds bilateral, no rhonchi S1-S2 tacky Abdomen is soft, tender right upper and lower quadrant serous drainage in JP drain Extremities shows no clubbing, has 1+ edema Awake interactive, nonfocal   Chest x-ray 6/16 personally reviewed which shows slight increase in bibasal infiltrates predominantly lower lobes  Resolved Hospital Problem list   Hyperkalemia Elevated Lipase (normalized 6/12) Non-gap and non-anion gap metabolic acidosis Lactic acidosis  Assessment & Plan:    Acute Hypoxemic respiratory failure -Some concern for aspiration 6/13  overnight -Pleural effusions -  clearing with diuresis  -Extubated 6/15 but again requiring high flow oxygen, worsening likely related to abdominal process   Septic shock secondary to ischemic colitis and bacterial translocation Klebsiella UTI -Body fluid culture negative -urine positive for Klebsiella 6/16 worsening septic shock, broaden antibiotics to meropenem Emergent CT abdomen/pelvis planned Check stat lactate   Acute kidney injury -baseline cr 1.3 in 2015 -Renal dose medications, holding benazepril -Strict input output -Trend electrolytes -hold lasix for now  Protein calorie malnutrition, mild --Continue TNA    Diabetes SSI -Hypoglycemic, increase D5W to 75/hour should also help for hypernatremia  Hypothyroidism -Continue Synthroid   Discussed with surgical service, concern here is for worsening ischemic bowel, anastomotic leak or abdominal collection  Best practice:  Diet: NPO, on TPN Pain/Anxiety/Delirium protocol (if indicated): na VAP protocol (if indicated): na DVT prophylaxis: scd GI prophylaxis: protonix Glucose control: sliding scale Mobility: bed Code Status: full  The patient is critically ill with multiple organ systems failure and requires high complexity decision making for assessment and support, frequent evaluation and titration of therapies, application of advanced monitoring technologies and extensive interpretation of multiple databases. Critical Care Time devoted to patient care services described in this note independent of APP/resident  time is 35 minutes.     Kara Mead MD. Shade Flood. [facility] Pager [phone] If no response call [phone]   07/29/2018

## 2018-07-29 NOTE — Procedures (Signed)
Arterial Catheter Insertion Procedure Note Hailey Hughes [id number] 02-10-1941  Procedure: Insertion of Arterial Catheter  Indications: Blood pressure monitoring and Frequent blood sampling  Procedure Details Consent: Risks of procedure as well as the alternatives and risks of each were explained to the (patient/caregiver).  Consent for procedure obtained. Time Out: Verified patient identification, verified procedure, site/side was marked, verified correct patient position, special equipment/implants available, medications/allergies/relevent history reviewed, required imaging and test results available.  Performed  Maximum sterile technique was used including antiseptics, cap, gloves, gown, hand hygiene and mask. Skin prep: Chlorhexidine; local anesthetic administered 20 gauge catheter was inserted into right radial artery using the Seldinger technique. ULTRASOUND GUIDANCE USED: NO Evaluation Blood flow good; BP tracing good. Complications: No apparent complications.   Hailey Hughes 07/29/2018 Hailey Hughes ACNP-BC [place] Pager # [phone] OR # [phone] if no answer

## 2018-07-29 NOTE — Evaluation (Signed)
SLP Cancellation Note  Patient Details Name: Hailey Hughes MRN: [id number] DOB: 1941/02/02   Cancelled treatment:       Reason Eval/Treat Not Completed: Other (comment);Patient at procedure or test/unavailable(pt being taken to CT for imaging, will continue efforts)   Macario Golds 07/29/2018, 10:12 AM   Luanna Salk, MS [facility] SLP Acute Rehab Services Pager [phone] Office [phone]

## 2018-07-29 NOTE — Progress Notes (Signed)
CRITICAL VALUE ALERT  Critical Value:  CBG 58 Hypoglycemic Event  CBG: 58  Treatment: D50 25 mL (12.5 gm)  Symptoms: None  Follow-up CBG: [id number] CBG Result:108  Possible Reasons for Event: Medication regimen: D50 25 ml  Comments/MD notified: Dr. Vita Erm    Date & Time Notied:  07/29/18  Provider Notified: [id number]  Orders Received/Actions taken: RN followed hypoglycemic protocol

## 2018-07-30 LAB — GLUCOSE, CAPILLARY
Glucose-Capillary: 241 mg/dL — ABNORMAL HIGH (ref 70–99)
Glucose-Capillary: 260 mg/dL — ABNORMAL HIGH (ref 70–99)
Glucose-Capillary: 218 mg/dL — ABNORMAL HIGH (ref 70–99)
Glucose-Capillary: 169 mg/dL — ABNORMAL HIGH (ref 70–99)
Glucose-Capillary: 134 mg/dL — ABNORMAL HIGH (ref 70–99)
Glucose-Capillary: 111 mg/dL — ABNORMAL HIGH (ref 70–99)

## 2018-07-30 LAB — CBC
HCT: 30.5 % — ABNORMAL LOW (ref 36.0–46.0)
Hemoglobin: 8.9 g/dL — ABNORMAL LOW (ref 12.0–15.0)
MCH: 28.3 pg (ref 26.0–34.0)
MCHC: 29.2 g/dL — ABNORMAL LOW (ref 30.0–36.0)
MCV: 96.8 fL (ref 80.0–100.0)
Platelets: 144 10*3/uL — ABNORMAL LOW (ref 150–400)
RBC: 3.15 MIL/uL — ABNORMAL LOW (ref 3.87–5.11)
RDW: 16.7 % — ABNORMAL HIGH (ref 11.5–15.5)
WBC: 22.7 10*3/uL — ABNORMAL HIGH (ref 4.0–10.5)
nRBC: 0.1 % (ref 0.0–0.2)

## 2018-07-30 LAB — BASIC METABOLIC PANEL
Anion gap: 12 (ref 5–15)
BUN: 97 mg/dL — ABNORMAL HIGH (ref 8–23)
CO2: 24 mmol/L (ref 22–32)
Calcium: 8.2 mg/dL — ABNORMAL LOW (ref 8.9–10.3)
Chloride: 110 mmol/L (ref 98–111)
Creatinine, Ser: 2.08 mg/dL — ABNORMAL HIGH (ref 0.44–1.00)
GFR calc Af Amer: 25 mL/min — ABNORMAL LOW (ref >60)
GFR calc non Af Amer: 22 mL/min — ABNORMAL LOW (ref >60)
Glucose, Bld: 294 mg/dL — ABNORMAL HIGH (ref 70–99)
Potassium: 4.7 mmol/L (ref 3.5–5.1)
Sodium: 146 mmol/L — ABNORMAL HIGH (ref 135–145)

## 2018-07-30 MED ORDER — ACETAMINOPHEN 10 MG/ML IV SOLN
1000.0000 mg | Freq: Once | INTRAVENOUS | Status: AC
  Administered 2018-07-30: 1000 mg via INTRAVENOUS
  Filled 2018-07-30: qty 100

## 2018-07-30 MED ORDER — TRAVASOL 10 % IV SOLN
INTRAVENOUS | Status: DC
  Filled 2018-07-30: qty 993.6

## 2018-07-30 MED ORDER — PANTOPRAZOLE SODIUM 40 MG IV SOLR
40.0000 mg | INTRAVENOUS | Status: DC
  Administered 2018-07-30 – 2018-08-03 (×5): 40 mg via INTRAVENOUS
  Filled 2018-07-30 (×5): qty 40

## 2018-07-30 MED ORDER — TRAVASOL 10 % IV SOLN
INTRAVENOUS | Status: AC
  Administered 2018-07-30: 17:00:00 via INTRAVENOUS
  Filled 2018-07-30: qty 993.6

## 2018-07-30 NOTE — Evaluation (Signed)
Physical Therapy Evaluation Patient Details Name: Hailey Hughes MRN: [id number] DOB: [birth year] Today's Date: 07/30/2018   History of Present Illness  78 year old white female admitted the hospital 6/9 with CT scan evidence of colitis transferred to the ICU  for septic shock, s/p colectomy and cholecystectomy 07/23/18  Clinical Impression  Pt admitted with above diagnosis. Pt currently with functional limitations due to the deficits listed below (see PT Problem List). +2 total assist for bed mobility, +2 max assist for sit to partial stand x 2 trials. Activity tolerance limited by incisional pain and fatigue. SaO2 88% on 14L HFNC with activity.  Pt will benefit from skilled PT to increase their independence and safety with mobility to allow discharge to the venue listed below.       Follow Up Recommendations SNF    Equipment Recommendations  Other (comment)(TBD at next venue)    Recommendations for Other Services       Precautions / Restrictions Precautions Precautions: Fall Precaution Comments: multiple lines, JP drain on L, A Line and PICC RUE, abdominal wound VAC Restrictions Weight Bearing Restrictions: No      Mobility  Bed Mobility Overal bed mobility: Needs Assistance Bed Mobility: Rolling;Supine to Sit;Sit to Supine Rolling: Total assist;+2 for physical assistance;+2 for safety/equipment   Supine to sit: Total assist;+2 for physical assistance;+2 for safety/equipment Sit to supine: Total assist;+2 for physical assistance;+2 for safety/equipment   General bed mobility comments: assist to raise trunk and advance LEs, pt 15%, VCs for technique  Transfers Overall transfer level: Needs assistance Equipment used: 2 person hand held assist Transfers: Sit to/from Stand Sit to Stand: +2 physical assistance;Max assist         General transfer comment: assist to rise, sit to stand x 2 trials, pt unable to come to full upright position, pt standing with flexed trunk,  she stood for ~15 seconds x 2 trials, tolerance limited by fatigue, SaO2 88% on 14 L HFNC with activity  Ambulation/Gait                Stairs            Wheelchair Mobility    Modified Rankin (Stroke Patients Only)       Balance Overall balance assessment: Needs assistance Sitting-balance support: Feet supported Sitting balance-Leahy Scale: Fair Sitting balance - Comments: posterior lean initially, then fair                                     Pertinent Vitals/Pain Pain Assessment: Faces Faces Pain Scale: Hurts even more Pain Location: abdomen with movement Pain Descriptors / Indicators: Aching Pain Intervention(s): Limited activity within patient's tolerance;Monitored during session;Repositioned    Home Living Family/patient expects to be discharged to:: Private residence Living Arrangements: Spouse/significant other Available Help at Discharge: Family   Home Access: Stairs to enter     Home Layout: One level Home Equipment: Radio producer - single point Additional Comments: garage one step in; front one to porch then one in    Prior Function Level of Independence: Independent with assistive device(s)(cane)         Comments: walks with SPC     Hand Dominance        Extremity/Trunk Assessment   Upper Extremity Assessment Upper Extremity Assessment: Defer to OT evaluation    Lower Extremity Assessment Lower Extremity Assessment: Generalized weakness(edema noted B feet; B knee ext -3/5)    Cervical /  Trunk Assessment Cervical / Trunk Assessment: Normal  Communication   Communication: No difficulties  Cognition Arousal/Alertness: Awake/alert Behavior During Therapy: WFL for tasks assessed/performed Overall Cognitive Status: Within Functional Limits for tasks assessed                                        General Comments      Exercises     Assessment/Plan    PT Assessment Patient needs continued PT  services  PT Problem List Decreased strength;Decreased mobility;Decreased activity tolerance;Pain       PT Treatment Interventions DME instruction;Gait training;Functional mobility training;Therapeutic exercise;Therapeutic activities;Patient/family education    PT Goals (Current goals can be found in the Care Plan section)  Acute Rehab PT Goals Patient Stated Goal: return to walking PT Goal Formulation: With patient Time For Goal Achievement: 08/14/18 Potential to Achieve Goals: Fair    Frequency Min 2X/week   Barriers to discharge        Co-evaluation PT/OT/SLP Co-Evaluation/Treatment: Yes Reason for Co-Treatment: For patient/therapist safety;Complexity of the patient's impairments (multi-system involvement);To address functional/ADL transfers PT goals addressed during session: Mobility/safety with mobility;Balance         AM-PAC PT "6 Clicks" Mobility  Outcome Measure Help needed turning from your back to your side while in a flat bed without using bedrails?: Total Help needed moving from lying on your back to sitting on the side of a flat bed without using bedrails?: Total Help needed moving to and from a bed to a chair (including a wheelchair)?: Total Help needed standing up from a chair using your arms (e.g., wheelchair or bedside chair)?: Total Help needed to walk in hospital room?: Total Help needed climbing 3-5 steps with a railing? : Total 6 Click Score: 6    End of Session Equipment Utilized During Treatment: Oxygen;Gait belt Activity Tolerance: Patient limited by fatigue Patient left: in bed;with call bell/phone within reach;with bed alarm set Nurse Communication: Mobility status PT Visit Diagnosis: Unsteadiness on feet (R26.81);Difficulty in walking, not elsewhere classified (R26.2);Muscle weakness (generalized) (M62.81);Pain    Time: 5456-2563 PT Time Calculation (min) (ACUTE ONLY): 34 min   Charges:   PT Evaluation $PT Eval Moderate Complexity: 1 Mod           Tamala Ser PT 07/30/2018  Acute Rehabilitation Services Pager [phone] Office [phone]

## 2018-07-30 NOTE — Progress Notes (Signed)
7 Days Post-Op  Subjective: CC:  Events from overnight noted. Patient had A-line placed to monitor hypotension. This appears improved this morning. Patient required non-rebreather yesterday. Off this now and back on 15L HFNC with O2 sats low to mid 90's. Still has slurred speech but responds to questions appropriately. She continues to complain of abdominal pain. Cannot tell me if this is better or worse. Some stool in ileostomy bag. Had NG tube placed yesterday. 150cc of black/dark green output noted in cannister. Underwent her CT of abdomen yesterday that did not show cause of her fever and elevated wbc count. There was noted RLL PNA. No fever overnight.   Objective: Vital signs in last 24 hours: Temp:  [97.7 F (36.5 C)-99 F (37.2 C)] 98.1 F (36.7 C) (06/17 0735) Pulse Rate:  [68-81] 77 (06/17 0800) Resp:  [18-33] 33 (06/17 0800) BP: (89-139)/(31-84) 106/31 (06/17 0800) SpO2:  [86 %-99 %] 94 % (06/17 0800) Arterial Line BP: (104-166)/(44-117) 128/45 (06/17 0800) Last BM Date: (ostomy)  Intake/Output from previous day: 06/16 0701 - 06/17 0700 In: 4099 [I.V.:3906.2; IV Piggyback:192.8] Out: 1525 [Urine:1200; Emesis/NG output:150; Drains:175] Intake/Output this shift: Total I/O In: 140.6 [I.V.:140.6] Out: -   PE: Gen: weak appearing with mumbled/slurred speech but is able to answer questions appropratley Heart: regular Lungs: Rales RML and RLL. Somewhat increase WOB but not significant. On HFNC 15L. 91-94% sats while I was in the room Abd: soft, generalized tenderness without peritonitis.  Hypoactive BS.  ileostomy with bilious outupt, midline wound with VAC in place. JP drain stripped. Cloudy white/light brown fluid noted. 175cc/24 hours.  Midline VAC in place with minimal output. NG tube in place on LIWS with 150cc of bilious like output.  Msk: Trace pitting edema lower extremities.   Lab Results:  Recent Labs    07/29/18 0430 07/30/18 0411  WBC 9.4 22.7*  HGB 9.6*  8.9*  HCT 32.9* 30.5*  PLT 122* 144*   BMET Recent Labs    07/29/18 0430 07/30/18 0411  NA 150* 146*  K 4.1 4.7  CL 114* 110  CO2 27 24  GLUCOSE 96 294*  BUN 93* 97*  CREATININE 2.12* 2.08*  CALCIUM 8.5* 8.2*   PT/INR No results for input(s): LABPROT, INR in the last 72 hours. CMP     Component Value Date/Time   NA 146 (H) 07/30/2018 0411   K 4.7 07/30/2018 0411   CL 110 07/30/2018 0411   CO2 24 07/30/2018 0411   GLUCOSE 294 (H) 07/30/2018 0411   BUN 97 (H) 07/30/2018 0411   CREATININE 2.08 (H) 07/30/2018 0411   CALCIUM 8.2 (L) 07/30/2018 0411   PROT 4.4 (L) 07/28/2018 0352   ALBUMIN 1.7 (L) 07/28/2018 0352   AST 26 07/28/2018 0352   ALT 20 07/28/2018 0352   ALKPHOS 92 07/28/2018 0352   BILITOT 0.7 07/28/2018 0352   GFRNONAA 22 (L) 07/30/2018 0411   GFRAA 25 (L) 07/30/2018 0411   Lipase     Component Value Date/Time   LIPASE 12 07/25/2018 0500       Studies/Results: Ct Abdomen Pelvis Wo Contrast  Result Date: 07/29/2018 CLINICAL DATA:  Colitis. Septic shock. Colectomy for ischemic colitis. Liver pressure. EXAM: CT ABDOMEN AND PELVIS WITHOUT CONTRAST TECHNIQUE: Multidetector CT imaging of the abdomen and pelvis was performed following the standard protocol without IV contrast. COMPARISON:  CT 07/22/2018 FINDINGS: Lower chest: Dense consolidation in the LEFT lower lobe with air bronchograms. Mild consolidation in LEFT lower lobe. Bilateral small effusions.  Hepatobiliary: No focal hepatic lesion. Postcholecystectomy. Non IV contrast exam. Pancreas: Pancreas is normal. No ductal dilatation. No pancreatic inflammation. Spleen: Normal spleen Adrenals/urinary tract: Adrenal glands normal. No renal obstruction. 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 Bladder is mildly distended. Stomach/Bowel: Stomach is fluid-filled. The duodenum is normal. There is mild dilatation of the small bowel up to 3.2 cm. Loop of small bowel in the LEFT abdomen is edematous with  bowel wall measuring up to 20 mm (image 45/5 and 51/2). No evidence of high-grade obstruction. No pneumatosis or intraperitoneal free air identified. RIGHT lower quadrant end ileostomy noted. There is a fluid within the mesentery associated with the small bowel. One collection of fluid in the RIGHT upper quadrant measures 5.0 by 4.3 cm (image 44/2). This collection is minimal organization. There is a percutaneous drain in the lateral RIGHT abdomen paralleling the distal ileum. Vascular/Lymphatic: Abdominal aorta is normal caliber with atherosclerotic calcification. There is no retroperitoneal or periportal lymphadenopathy. No pelvic lymphadenopathy. Reproductive: Uterus and adnexa grossly normal Other: No intraperitoneal free air. Musculoskeletal: No aggressive osseous lesion. IMPRESSION: 1. Dense consolidation RIGHT lower lobe is concerning for pneumonia. 2. No evidence of bowel obstruction. Edematous small bowel in the LEFT abdomen. No pneumatosis. 3. Mild intraabdominal free fluid. One pocket of fluid in the RIGHT upper quadrant with minimal organization measures 5 cm. 4. Percutaneous drain in the RIGHT abdomen. 5. Post colectomy and end ileostomy. Electronically Signed   By: Suzy Bouchard [name]   On: 07/29/2018 10:56   Dg Chest 1 View  Result Date: 07/29/2018 CLINICAL DATA:  78 y/o  F; shortness of breath. EXAM: CHEST  1 VIEW COMPARISON:  07/29/2018 chest radiograph. FINDINGS: Stable cardiac silhouette given projection and technique. Aortic atherosclerosis with calcification. Stable left-greater-than-right lung base consolidations. Enteric tube extends below the field of view into the abdomen. Right PICC line tip projects over lower SVC. No pleural effusion or pneumothorax. No acute osseous abnormality is evident. IMPRESSION: Stable left-greater-than-right lung base consolidations. Right PICC line tip projects over lower SVC. Enteric tube tip extends below the field of view into the abdomen. Electronically  Signed   By: Kristine Garbe [name]   On: 07/29/2018 22:29   Dg Abd 1 View  Result Date: 07/29/2018 CLINICAL DATA:  78 y/o  F; shortness of breath. EXAM: ABDOMEN - 1 VIEW COMPARISON:  07/29/2018 CT abdomen and pelvis and abdomen radiographs. FINDINGS: Enteric tube tip projects over the gastric body. Advanced rotatory dextrocurvature of lumbar spine. Normal included bowel gas pattern, the right abdomen is excluded. IMPRESSION: Enteric tube tip projects over the gastric body. Electronically Signed   By: Kristine Garbe [name]   On: 07/29/2018 22:30   Dg Abd 1 View  Result Date: 07/29/2018 CLINICAL DATA:  Abdominal distension. EXAM: ABDOMEN - 1 VIEW COMPARISON:  Abdominal radiographs 07/23/2018. CT of the abdomen and pelvis 07/22/2018. FINDINGS: Moderate prominence of small bowel loops are present following total colectomy and cholecystectomy. Surgical drain is in place. Degenerative changes are noted in the lumbar spine. IMPRESSION: 1. Mild prominence of small bowel loops without obstruction likely represents mild ileus. 2. Total colectomy. Electronically Signed   By: San Morelle [name]   On: 07/29/2018 05:26   Ct Head Wo Contrast  Result Date: 07/29/2018 CLINICAL DATA:  Fever.  Acute mental status changes. EXAM: CT HEAD WITHOUT CONTRAST TECHNIQUE: Contiguous axial images were obtained from the base of the skull through the vertex without intravenous contrast. COMPARISON:  None. FINDINGS: Brain: Mild atrophy and  white matter changes are within normal limits for age. No acute infarct, hemorrhage, or mass lesion is present. The ventricles are of normal size. No significant extraaxial fluid collection is present. Vascular: Atherosclerotic changes are noted in the cavernous internal carotid arteries and at the dural margin of both vertebral arteries. There is no hyperdense vessel. Skull: Calvarium is intact. No focal lytic or blastic lesions are present. No significant extra cranial soft  tissue lesions are present. Sinuses/Orbits: The paranasal sinuses and mastoid air cells are clear. The globes and orbits are within normal limits. IMPRESSION: 1. Normal CT appearance of the brain for age. 2. Diffuse atherosclerotic calcifications. Electronically Signed   By: Marin Robertshristopher  Mattern [name]   On: 07/29/2018 06:31   Dg Chest Port 1 View  Result Date: 07/29/2018 CLINICAL DATA:  Hypoxia. EXAM: PORTABLE CHEST 1 VIEW COMPARISON:  One-view chest x-ray 07/28/2018 FINDINGS: Heart size is normal. The patient has been extubated. Left subclavian line is stable. Lung volumes remain low. Aeration of both lungs is slightly improved. Bibasilar airspace opacities remain. Left greater than right pleural effusion is present. IMPRESSION: 1. Improving aeration the bilateral lower lobe airspace disease. Disease remains worse on the left. 2. Interval extubation and removal of NG tube. 3. Left subclavian line is stable. 4. Persistent low lung volumes. Electronically Signed   By: Marin Robertshristopher  Mattern [name]   On: 07/29/2018 05:35   [place] Ekg Site Rite  Result Date: 07/29/2018 If Site Rite image not attached, placement could not be confirmed due to current cardiac rhythm.   Anti-infectives: Anti-infectives (From admission, onward)   Start     Dose/Rate Route Frequency Ordered Stop   07/29/18 0600  meropenem (MERREM) 1 g in sodium chloride 0.9 % 100 mL IVPB     1 g 200 mL/hr over 30 Minutes Intravenous Every 12 hours 07/29/18 0457     07/29/18 0500  ceFEPIme (MAXIPIME) 2 g in sodium chloride 0.9 % 100 mL IVPB  Status:  Discontinued     2 g 200 mL/hr over 30 Minutes Intravenous Every 24 hours 07/29/18 0436 07/29/18 0457   07/26/18 1400  piperacillin-tazobactam (ZOSYN) IVPB 3.375 g  Status:  Discontinued     3.375 g 12.5 mL/hr over 240 Minutes Intravenous Every 6 hours 07/26/18 0906 07/26/18 1339   07/26/18 1400  cefTRIAXone (ROCEPHIN) 1 g in sodium chloride 0.9 % 100 mL IVPB  Status:  Discontinued     1 g 200  mL/hr over 30 Minutes Intravenous Every 24 hours 07/26/18 1339 07/29/18 0436   07/23/18 0200  piperacillin-tazobactam (ZOSYN) IVPB 2.25 g  Status:  Discontinued     2.25 g 100 mL/hr over 30 Minutes Intravenous Every 6 hours 07/22/18 2248 07/26/18 0906   07/22/18 1800  piperacillin-tazobactam (ZOSYN) IVPB 3.375 g  Status:  Discontinued     3.375 g 12.5 mL/hr over 240 Minutes Intravenous Every 8 hours 07/22/18 0953 07/22/18 2248   07/22/18 1000  piperacillin-tazobactam (ZOSYN) IVPB 3.375 g     3.375 g 100 mL/hr over 30 Minutes Intravenous  Once 07/22/18 0953 07/22/18 1239   07/22/18 0745  ciprofloxacin (CIPRO) IVPB 400 mg     400 mg 200 mL/hr over 60 Minutes Intravenous  Once 07/22/18 0739 07/22/18 0921   07/22/18 0745  metroNIDAZOLE (FLAGYL) IVPB 500 mg     500 mg 100 mL/hr over 60 Minutes Intravenous  Once 07/22/18 0739 07/22/18 1024       Assessment/Plan Type 2 diabetes Hypothyroid Rheumatoid arthritis Struct of sleep apnea Morbid  obesity BMI 36.58 AKI - Cr trending down 2.08, good UOP Malnutrition - prealbumin <5, on TPN Klebsiella UTI VDRF, per CCM, weaning today well per RN Thrombocytopenia - plts 144K today  Septic shock Ischemic colitis, acute cholecystitis POD 7, S/pExploratory laparotomy, total abdominal colectomy, [name], cholecystectomy-6/10-Dr. Derrell LollingIngram - CTAP 6/16 w/out intra-abdominal cause of fever and elevated WBC. Lactic acid 1.6 yesterday.There is noted RLL PNA. Currently on Meropnem per CCM - cont VAC changes, will look at wound when VAC changed today - having good bowel function currently. Had NGT placed yesterday.  - Speech to perform swallow study prior to diet - cont JP  - Cont TPN.   ID -Currently Meropnem, WBC 22,700 FEN -IVF, TPN, NPO VTE -SCDs, ok for sq heparin or lovenox from surgical standpoint Foley -in place Follow up -Dr. Marnette BurgessIngram Contact - son Onalee HuaDavid [phone], husband Henreitta CeaGarry [phone]   LOS: 8 days    Jacinto HalimMichael  M Montie Swiderski , [facility] 07/30/2018, 9:24 AM Pager: [phone]

## 2018-07-30 NOTE — Evaluation (Signed)
Clinical/Bedside Swallow Evaluation Patient Details  Name: Hailey Hughes MRN: [id number] Date of Birth: January 29, 1941  Today's Date: 07/30/2018 Time: SLP Start Time (ACUTE ONLY): 1420 SLP Stop Time (ACUTE ONLY): 1440 SLP Time Calculation (min) (ACUTE ONLY): 20 min  Past Medical History:  Past Medical History:  Diagnosis Date  . Depression   . Diverticulosis   . DM2 (diabetes mellitus, type 2) (HCC)   . Hiatal hernia   . 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 HTN (hypertension)   . Hyperlipidemia   . Hypothyroidism   . Imperforate anus   . Obesity   . OSA (obstructive sleep apnea)   . Palpitations   . Reflux esophagitis    Past Surgical History:  Past Surgical History:  Procedure Laterality Date  . APPENDECTOMY    . CARPAL TUNNEL RELEASE Right   . COLON RESECTION  1958   age 89  . 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 CYSTECTOMY     spinal  . LAPAROTOMY N/A 07/23/2018   Procedure: TOTAL ABDOMINAL COLECTOMY, [name], CHOLECYSTECTOMY;  Surgeon: Claud Kelp, MD;  Location: [facility]S;  Service: General;  Laterality: N/A;  . RECTOVAGINAL FISTULA CLOSURE     congenital   HPI:  Patient is an 78 y.o. female with PMH: HTN, HLD, prior abdominal surgery due to megacolon when 78 years old, hypothyroidism, DM-2, RA, OSA, reflux esophagitis, who presented to [facility] due to abdominal pain, nausea and vomitting of one-day duration. She was admitted under primary diagnosis of sepsis secondary to generalized colitis, also with UTI. She underwent a colectomy and cholecystectomy on 07/23/18 and was intubated from 6/10-6/15.   Assessment / Plan / Recommendation Clinical Impression  Patient presents with a mild-moderate oropharyngeal dysphagia which is impacted by prolonged intubation of 6 days. Patient was fatigued and lethargic but able to maintan alertness to complete bedside swallow assessment and to  answer pertinent questions. She consumed ice chips and drank small straw sips of plain water with delayed coughing and throat clearing but also with expectoration (with help from yaunkers) of phlegm/pharyngeal secretions. Patient's voice was low vocal intensity and mildly hoarse, but intelligible without difficulty when she had enough breath support. As patient currently has NG tube for nutrition and medications, plan to continue NPO but allow for ice chips or sips of plain water after oral care. SLP to f/u with patient for PO readiness trials. SLP Visit Diagnosis: Dysphagia, unspecified (R13.10)    Aspiration Risk  Mild aspiration risk;Moderate aspiration risk    Diet Recommendation Ice chips PRN after oral care;NPO   Liquid Administration via: Cup;No straw Medication Administration: Via alternative means    Other  Recommendations Oral Care Recommendations: Oral care QID   Follow up Recommendations Other (comment)(TBD pending progress)      Frequency and Duration min 2x/week  1 week       Prognosis Prognosis for Safe Diet Advancement: Good      Swallow Study   General Date of Onset: 07/22/18 HPI: Patient is an 78 y.o. female with PMH: HTN, HLD, prior abdominal surgery due to megacolon when 78 years old, hypothyroidism, DM-2, RA, OSA, reflux esophagitis, who presented to [facility] due to abdominal pain, nausea and vomitting of one-day duration. She was admitted under primary diagnosis of sepsis secondary to generalized colitis, also with UTI. She underwent a colectomy and cholecystectomy on 07/23/18 and was intubated from 6/10-6/15.  Type of Study: Bedside Swallow Evaluation Previous Swallow Assessment: N/A Diet Prior to this Study: NPO Temperature Spikes Noted: No Respiratory Status: Nasal cannula History of Recent Intubation: Yes Length of Intubations (days): 6 days Date extubated: 07/28/18 Behavior/Cognition: Alert;Cooperative;Lethargic/Drowsy;Pleasant mood Oral Cavity Assessment:  Within Functional Limits Oral Care Completed by SLP: Yes Oral Cavity - Dentition: Adequate natural dentition Vision: Functional for self-feeding Self-Feeding Abilities: Needs assist;Needs set up Patient Positioning: Upright in bed Baseline Vocal Quality: Hoarse;Other (comment)(voice is mildly hoarse) Volitional Cough: Strong Volitional Swallow: Able to elicit    Oral/Motor/Sensory Function Overall Oral Motor/Sensory Function: Within functional limits   Ice Chips     Thin Liquid Thin Liquid: Impaired Presentation: Straw Pharyngeal  Phase Impairments: Cough - Immediate;Cough - Delayed;Throat Clearing - Immediate Other Comments: Difficult to differentiate whether coughing from thin liquids or from phlegm/mucous in pharynx, as patient was coughing up and suctioning out secretions from oral cavity prior to PO's.    Nectar Thick Nectar Thick Liquid: Not tested   Honey Thick Honey Thick Liquid: Not tested   Puree Puree: Not tested   Solid     Solid: Not tested      [name] 07/30/2018,4:18 PM    Hailey Baller, MA, CCC-SLP Speech Therapy  Acute Rehab

## 2018-07-30 NOTE — Progress Notes (Signed)
Both Vaso and Levo are stopped.

## 2018-07-30 NOTE — Progress Notes (Addendum)
Nutrition Follow-up  RD working remotely.   DOCUMENTATION CODES:   Obesity unspecified  INTERVENTION:  - continue TPN per Pharmacy. - will monitor for ability to re-attempt diet advancement - weigh patient today.    NUTRITION DIAGNOSIS:   Inadequate oral intake related to inability to eat as evidenced by NPO status. -ongoing  GOAL:   Patient will meet greater than or equal to 90% of their needs-will be met with TPN regimen   MONITOR:   Diet advancement, Labs, Weight trends, I & O's, Other (Comment)(TPN regimen)  ASSESSMENT:   78 y.o. female with medical history significant for HTN, hyperlipidemia, prior abdominal surgery d/t megacolon when she was 78 years old, hypothyroidism, type 2 DM, and rheumatoid arthritis. She presented to [facility] due to abdominal pain, N/V x1 day. Stated she has been experiencing generalized weakness and thought it was due to constipation which she was experiencing for the 3 days PTA. Had an enema with minimal hard stool output 6/8. Since then has been experiencing worsening abdominal cramping with nausea and vomiting x2.  Significant Events: 6/9- admission 6/10- intubated; OGT placed; total colectomy, ileostomy, cholecystectomy 6/11- TPN started via CVC in R subclavian 6/15- extubated and OGT removed; diet advanced to CLD 6/16- triple lumen PICC placed in R cephalic; NGT placed for decompression   Patient remains extubated since 6/15 AM. Diet was advanced from NPO to CLD following extubation with no intakes documented. She was changed back to NPO yesterday at 8:10 AM at which time NGT was placed d/t worsening abdominal pain.   R subclavian central line removed and replaced with triple lumen PICC yesterday. She is receiving custom TPN at 60 ml/hr which is providing 1175 kcal, 99 grams protein. Updated estimated nutrition needs this AM d/t extubation; able to communicate with Pharmacist via secure chat so that TPN regimen can be adjusted tomorrow.   Per  notes: patient developed fever and hypotension requiring pressors starting overnight of 6/16, worsening septic shock, s/p CT head d/t AMS which was negative, SLP following for ability to trial POs with patient, AKI, s/p abdomen unremarkable but continued concern for ischemic bowel.    Medications reviewed; sliding scale novolog, 56 mcg IV synthroid/day. Labs reviewed; CBGs: 241 and 260 mg/dl today, Na: 146 mmol/l, BUN: 97 mg/dl, creatinine: 2.08 mg/dl, Ca: 8.2 mg/dl, GFR: 22 ml/min, triglycerides on 6/15: 176 mg/dl. IVF; D5 @ 20 ml/hr (82 kcal)     Diet Order:   Diet Order            Diet NPO time specified  Diet effective now              EDUCATION NEEDS:   No education needs have been identified at this time  Skin:  Skin Assessment: Skin Integrity Issues: Skin Integrity Issues:: Incisions Incisions: abdomen (6/10)  Last BM:  6/5  Height:   Ht Readings from Last 1 Encounters:  07/23/18 5' 2"  (1.575 m)    Weight:   Wt Readings from Last 1 Encounters:  07/24/18 108.5 kg    Ideal Body Weight:  50 kg  BMI:  Body mass index is 43.75 kg/m.  Estimated Nutritional Needs:   Kcal:  2100-2300 kcal  Protein:  100-115 grams  Fluid:  >/= 1.8 L/day     Jarome Matin, MS, RD, LDN, [facility] Inpatient Clinical Dietitian Pager # [phone] After hours/weekend pager # [phone]

## 2018-07-30 NOTE — Progress Notes (Signed)
PHARMACY - ADULT TOTAL PARENTERAL NUTRITION CONSULT NOTE   Pharmacy Consult for TPN Indication: s/p massive bowel resection  Patient Measurements: Height: 5\' 2"  (157.5 cm) Weight: 239 lb 3.2 oz (108.5 kg) IBW/kg (Calculated) : 50.1 TPN AdjBW (KG): 60.3 Body mass index is 43.75 kg/m.  Insulin Requirements: 11 units moderate scale SSI last 24hr (all since current TPN bag hung)  Current Nutrition: NPO  IVF: D5W @ 71ml/hr  Central access: PICC TPN start date: 6/11  ASSESSMENT                                                                                                          HPI: 78 yo female s/p total abdominal colectomy, brooke ileostomy and cholecystecomy on 6/10 to start TPN.   Significant events:   Today, 07/30/18  Glucose (goal CBGs < 150): 90-294; all readings have been above goal since current TPN bag hung which could be combination of decreased insulin + initiation of pressors in D5W + D5W IVF hung.  Note hx DM on metformin PTA.  Electrolytes - all WNL except Na slightly elevated at 146 (trending down with decrease in Na in TPN).  Renal - SCr 2.08, slowly improving.  Good UOP.  LFTs - AST/ALT WNL  TGs - 225 (6/12), 176 (6/15)  Prealbumin - 5 (6/12), <5 (6/15)  NUTRITIONAL GOALS                                                                                             RD recs: 100g protein/day, 1000-1270 kcal/day - high protein, hypocaloric formula  Custom TPN (high protein, hypocaloric formula) at a new goal rate of 25ml/hr to provide  99g/day protein and 1175 kcal/day.  PLAN                                                                                                                         At 1800 today:  Continue custom TPN high protein hypocaloric formula due to guidelines for critically obese ICU patient - see above for new goals. Continue TPN at goal 60 ml/hr - protein 69g/L, dextrose 10%, and lipid 20g/L  TPN will have standard concentrations of  electrolytes except: continue phos at 15, Ca at 10, decrease Na to 30, change Cl:Acet at 1:2  TPN to contain standard multivitamins daily and trace elements MWF only due to national shortage  IVF per MD  Continue SSI/CBGs q4h, increase insulin in TPN to 25 units.  TPN lab panels on Mondays & Thursdays, BMET in am.   Junita Push, PharmD, BCPS 07/30/2018 7:38 AM

## 2018-07-30 NOTE — Progress Notes (Signed)
NAME:  Hailey Hughes, MRN:  [id number], DOB:  [birth year], LOS: 65 ADMISSION DATE:  07/22/2018, CONSULTATION DATE: 07/22/2018 REFERRING MD: Francia Greaves MD, CHIEF COMPLAINT: Colitis  Brief History   Patient is an 78 year old white female admitted the hospital 6/9 with CT scan evidence of colitis transferred to the ICU  for septic shock Underwent total colectomy for ischemic colitis  Past Medical History  Prior multiple abdominal surgeries, hypothyroidism, type 2 diabetes rheumatoid arthritis, obstructive sleep apnea  Significant Hospital Events   Admission 07/22/2018 transferred to ICU for hypotension 6/10: Received multiple boluses of IV crystalloid remained hypotensive requiring phenylephrine infusion.  Started on vasopressin on top of phenylephrine, central line placed.  Patient did have episode of vomiting.  Developed worsening work of breathing, desaturation, and worsening metabolic acidosis Ultimately required intubation.  Lactic acid initially slightly improved from peak of 5.4 down to 3.5. having exquisite abdominal pain, surgical services consulted->went for emergent exploratory lap, found to have ischemic colitis and possibly ischemic GB she underwent total colectomy w/ ileostomy placement as well as cholecystectomy. She returned to the ICU w/ three vasoactive gtts and bicarb gtt doubled. PCCM asked to resume care.  6/11: Acid-base improving, vasoactive drip requirements decreased, chest x-ray worse with worsening airspace disease and right effusion, decreasing IV fluid rate PEEP adjusted up to facilitate weaning FiO2 starting TNA 6/12.  Vasopressin off.  Weaning norepinephrine to off.  Starting diuresis.  Continuing antibiotics, TNA, and supportive care.  Discontinuing bicarbonate infusion.  Overall improving working towards pressure support ventilation 6/13 still requiring PEEP of 8, 50% 6/14 Concern for aspiration of bilious secretions overnight with increasing hypoxia to 70% FiO2, remains  on PEEP of 8 6/15 Extubated  6/16  developed fever and hypotension,  pressors gradually increased through the night to Levophed at 40 mics, Developed altered mental status head CT was negative >> attributed to Dilaudid  Consults:  Critical care 6/9 General surgery consulted 6/10  Procedures:  ETT 6/10 >>6/15  CVL left subclavian 6/10 >>6/16 RUE PICC 6/16 >> Arterial line 6/10 >> out RT radial a line 6/16 >>  Significant Diagnostic Tests:  Head CT 6/ 15 >> neg 6/16 CT abd/ pelvis >> Dense consolidation RIGHT lower lobe  No evidence of bowel obstruction. Edematous small bowel in the LEFT abdomen. No pneumatosis.  Mild intraabdominal free fluid. One pocket of fluid in the RIGHT upper quadrant with minimal organization measures 5 cm.  Micro Data:  MRSA screen 6/9: neg BCX2 6/9>>>ng COVID-19 6/9: neg Urine culture 6/9: Klebsiella pneumoniae and sensitive to Rocephin Blood 6/16 >>  Antimicrobials:  Zosyn 6/9-6/13 Rocephin 6/13 >>6/16 Meropenem 6/16 >>  Interim history/subjective:   Has defervesced Critically ill, on lower doses of Levophed and vasopressin Remains on high flow nasal cannula Biliary secretions prompting insertion of NG tube overnight     Objective   Blood pressure (!) 106/31, pulse 77, temperature 98.1 F (36.7 C), temperature source Oral, resp. rate (!) 33, height 5\' 2"  (1.575 m), weight 108.5 kg, SpO2 94 %.        Intake/Output Summary (Last 24 hours) at 07/30/2018 0841 Last data filed at 07/30/2018 0800 Gross per 24 hour  Intake 3968.5 ml  Output 975 ml  Net 2993.5 ml   Filed Weights   07/22/18 0148 07/24/18 1132  Weight: 90.7 kg 108.5 kg   Examination: Elderly lady, obese, mild distress, on high flow nasal cannula Mild pallor, no icterus , bilious drainage from NG tube Decreased breath sounds bilateral, no rhonchi  S1-S2 tachy, regular Abdomen is soft, tender right lower quadrant, no rebound, serous drainage in JP drain , ileostomy appears  pink Extremities shows no clubbing, has 1+ edema Awake interactive, nonfocal   Chest x-ray 6/16 personally reviewed -stable bibasilar infiltrates  Resolved Hospital Problem list   Hyperkalemia Elevated Lipase (normalized 6/12) Non-gap and non-anion gap metabolic acidosis Lactic acidosis  Assessment & Plan:    Acute Hypoxemic respiratory failure -Aspiration pneumonia -Pleural effusions -  clearing with diuresis -Extubated 6/15 but requiring high flow oxygen due to aspiration/bibasal pneumonia   Septic shock secondary to ischemic colitis and bacterial translocation Klebsiella UTI -Body fluid culture negative -urine positive for Klebsiella 6/16 worsening septic shock, broadened antibiotics to meropenem    Acute kidney injury -baseline cr 1.3 in 2015 -Renal dose medications, holding benazepril -Strict input output -Trend electrolytes -Needs diuresis again once off pressors but hold Lasix for now  Protein calorie malnutrition, mild --Continue TNA  Diabetes SSI -Drop D5W to 20/hour since hypernatremia has improved  Hypothyroidism -Continue Synthroid  Pain -altered mental status with Dilaudid, use lower doses Trial of IV Tylenol Discontinue Robaxin  Summary - CT abdomen and low lactate reassuring but ongoing concern for ischemic bowel .  She will need swallow evaluation before advancing oral in any form, will wait for hypoxia & shock to improve first  Best practice:  Diet: NPO, on TPN Pain/Anxiety/Delirium protocol (if indicated): na VAP protocol (if indicated): na DVT prophylaxis: scd GI prophylaxis: protonix Glucose control: sliding scale Mobility: bed Code Status: full  The patient is critically ill with multiple organ systems failure and requires high complexity decision making for assessment and support, frequent evaluation and titration of therapies, application of advanced monitoring technologies and extensive interpretation of multiple databases. Critical  Care Time devoted to patient care services described in this note independent of APP/resident  time is 32 minutes.      Cyril Mourning MD. Tonny Bollman. [place] [facility] Pager [phone] If no response call [phone]   07/30/2018

## 2018-07-30 NOTE — Consult Note (Signed)
[place] Nurse wound follow up Wound type: Surgical Measurement:25cm x 5cm x 4.5cm with 4cm tunneling at 12 o'clock. Wound bed: dry, red at base, yellow subcutaneous adipose tissue closer to skin level Drainage (amount, consistency, odor) Serous in a small to moderate amount Periwound: intact with mild erythema.  Dressing procedure/placement/frequency: 1 piece of black foam removed from wound. Two pieces used to obliterate dead space and tucked into defect at 12 o'clock. Periwound skin protected with drape. Foam covered with drape and attached to negative continuous pressure at 148mmHg. An immediate seal is achieved. Hailey Hughes in to see and photograph wound and ostomy. His assistance with the dressing change is appreciated.  [place] Nurse ostomy follow up Stoma type/location: RUQ ileostomy Stomal assessment/size: 1 and 3/8 oval with os at center, red, moist Peristomal assessment: intact with peristoma moisture associated skin damage. Will monitor to determine if there is fungal overgrowth. Treatment options for stomal/peristomal skin: Skin barrier ring and convex pouch Output: dark green effluent in a small amount Ostomy pouching: 1pc.soft convex with skin barrier ring  Education provided: None today Enrolled patient in [place] Discharge program: No   WOC nursing team will follow and see on Friday.  [place] Nursing team will remain available to this patient, the nursing, surgical and medical teams.   Thanks, Maudie Flakes, MSN, RN, [place], Arther Abbott  Pager# [phone]

## 2018-07-30 NOTE — Evaluation (Signed)
Occupational Therapy Evaluation Patient Details Name: Hailey Hughes MRN: [id number] DOB: 10/20/1940 Today's Date: 07/30/2018    History of Present Illness 78 year old white female admitted the hospital 6/9 with CT scan evidence of colitis transferred to the ICU  for septic shock, s/p colectomy and cholecystectomy 07/23/18   Clinical Impression   Pt was admitted for the above. She was willing to participate in evaluation, but is very weak.  She did stand with max +2 and beared weight through legs, but was not standing forward enough to safely complete transfer. She was unable to self feed ice chips and needs total A for adls (+2 for LB). Will follow in acute with the goals listed below     Follow Up Recommendations  SNF    Equipment Recommendations  (to be further assessed)    Recommendations for Other Services       Precautions / Restrictions Precautions Precautions: Fall Precaution Comments: multiple lines, JP drain on L, A Line and PICC RUE, abdominal wound VAC Restrictions Weight Bearing Restrictions: No      Mobility Bed Mobility Overal bed mobility: Needs Assistance Bed Mobility: Rolling;Supine to Sit;Sit to Supine Rolling: Total assist;+2 for physical assistance;+2 for safety/equipment   Supine to sit: Total assist;+2 for physical assistance;+2 for safety/equipment Sit to supine: Total assist;+2 for physical assistance;+2 for safety/equipment   General bed mobility comments: assist to raise trunk and advance LEs, pt 15%, VCs for technique  Transfers Overall transfer level: Needs assistance Equipment used: 2 person hand held assist Transfers: Sit to/from Stand Sit to Stand: +2 physical assistance;Max assist         General transfer comment: assist to rise, sit to stand x 2 trials, pt unable to come to full upright position, pt standing with flexed trunk, she stood for ~15 seconds x 2 trials, tolerance limited by fatigue, SaO2 88% on 14 L HFNC with  activity    Balance Overall balance assessment: Needs assistance Sitting-balance support: Feet supported Sitting balance-Leahy Scale: Fair Sitting balance - Comments: posterior lean initially, then fair                                   ADL either performed or assessed with clinical judgement   ADL Overall ADL's : Needs assistance/impaired                                       General ADL Comments: total A for all adls; could not self feed ice chips (allowed by RN).  total +2 to roll. Semi stood with +2, but would need 3rd person for any adl assist from this position     Vision         Perception     Praxis      Pertinent Vitals/Pain Pain Assessment: Faces Faces Pain Scale: Hurts even more Pain Location: abdomen with movement Pain Descriptors / Indicators: Aching Pain Intervention(s): Limited activity within patient's tolerance;Monitored during session;Repositioned     Hand Dominance     Extremity/Trunk Assessment Upper Extremity Assessment Upper Extremity Assessment: Generalized weakness(grossly 2+ /5 shoulders.  reports arthritis)   Lower Extremity Assessment Lower Extremity Assessment: Generalized weakness(edema noted B feet; B knee ext -3/5)   Cervical / Trunk Assessment Cervical / Trunk Assessment: Normal   Communication Communication Communication: No difficulties   Cognition Arousal/Alertness: Awake/alert Behavior During  Therapy: WFL for tasks assessed/performed Overall Cognitive Status: Within Functional Limits for tasks assessed                                     General Comments       Exercises     Shoulder Instructions      Home Living Family/patient expects to be discharged to:: Private residence Living Arrangements: Spouse/significant other Available Help at Discharge: Family   Home Access: Stairs to enter     Home Layout: One level     Bathroom Shower/Tub: Walk-in shower(small)    Bathroom Toilet: Handicapped height     Home Equipment: Radio producer - single point   Additional Comments: garage one step in; front one to porch then one in      Prior Functioning/Environment Level of Independence: Independent with assistive device(s)(cane)        Comments: walks with SPC        OT Problem List: Decreased strength;Decreased activity tolerance;Impaired balance (sitting and/or standing);Decreased knowledge of use of DME or AE;Decreased knowledge of precautions;Pain;Obesity;Cardiopulmonary status limiting activity      OT Treatment/Interventions: Self-care/ADL training;Therapeutic exercise;Energy conservation;DME and/or AE instruction;Therapeutic activities;Patient/family education;Balance training    OT Goals(Current goals can be found in the care plan section) Acute Rehab OT Goals Patient Stated Goal: return to walking OT Goal Formulation: With patient Time For Goal Achievement: 08/13/18 Potential to Achieve Goals: Fair ADL Goals Pt Will Transfer to Toilet: with mod assist;with +2 assist;bedside commode;stand pivot transfer Additional ADL Goal #1: pt will perform self feeding, grooming and UB bathing with set up, supported sitting Additional ADL Goal #2: pt will go from sit to stand with mod +2 assist for adls and maintain with min A for 2 minutes Additional ADL Goal #3: pt will perform 2 sets of 10, AAROM to bil UEs to increase strength for adls Additional ADL Goal #4: pt will perform bed mobility with mod +2, sidelying<> sit in preparation for toilet transfers  OT Frequency: Min 2X/week   Barriers to D/C:            Co-evaluation PT/OT/SLP Co-Evaluation/Treatment: Yes Reason for Co-Treatment: For patient/therapist safety PT goals addressed during session: Mobility/safety with mobility OT goals addressed during session: ADL's and self-care      AM-PAC OT "6 Clicks" Daily Activity     Outcome Measure Help from another person eating meals?: Total Help from  another person taking care of personal grooming?: Total Help from another person toileting, which includes using toliet, bedpan, or urinal?: Total Help from another person bathing (including washing, rinsing, drying)?: Total Help from another person to put on and taking off regular upper body clothing?: Total Help from another person to put on and taking off regular lower body clothing?: Total 6 Click Score: 6   End of Session Nurse Communication: (drains/canister to be emptied; purewick replaced)  Activity Tolerance: Patient limited by fatigue Patient left: in bed;with call bell/phone within reach;with bed alarm set  OT Visit Diagnosis: Unsteadiness on feet (R26.81);Muscle weakness (generalized) (M62.81)                Time: 5009-3818 OT Time Calculation (min): 38 min Charges:  OT General Charges $OT Visit: 1 Visit OT Evaluation $OT Eval Moderate Complexity: [place], OTR/L Acute Rehabilitation Services [phone] [facility] pager [phone] office 07/30/2018  [place] 07/30/2018, 3:06 PM

## 2018-07-30 NOTE — Progress Notes (Addendum)
RN attempted to take pt. Rings off with lube, taped them down to get swelling down, and tried to twist it off with strings. All methods were unsuccessful. Pt. Agreed to allow CN and AC to cut rings off. RN and CN will sign off and put rings in the safety box.   Edit: Pt. Son brought pt. Some belongings and picked up the pt.'s rings. RN got son to sign sheet and RN put chart in pt.chart.

## 2018-07-31 LAB — COMPREHENSIVE METABOLIC PANEL
ALT: 13 U/L (ref 0–44)
AST: 23 U/L (ref 15–41)
Albumin: 1.3 g/dL — ABNORMAL LOW (ref 3.5–5.0)
Alkaline Phosphatase: 51 U/L (ref 38–126)
Anion gap: 8 (ref 5–15)
BUN: 90 mg/dL — ABNORMAL HIGH (ref 8–23)
CO2: 21 mmol/L — ABNORMAL LOW (ref 22–32)
Calcium: 7.8 mg/dL — ABNORMAL LOW (ref 8.9–10.3)
Chloride: 113 mmol/L — ABNORMAL HIGH (ref 98–111)
Creatinine, Ser: 1.6 mg/dL — ABNORMAL HIGH (ref 0.44–1.00)
GFR calc Af Amer: 35 mL/min — ABNORMAL LOW (ref >60)
GFR calc non Af Amer: 30 mL/min — ABNORMAL LOW (ref >60)
Glucose, Bld: 363 mg/dL — ABNORMAL HIGH (ref 70–99)
Potassium: 5 mmol/L (ref 3.5–5.1)
Sodium: 142 mmol/L (ref 135–145)
Total Bilirubin: 0.2 mg/dL — ABNORMAL LOW (ref 0.3–1.2)
Total Protein: 4.1 g/dL — ABNORMAL LOW (ref 6.5–8.1)

## 2018-07-31 LAB — GLUCOSE, CAPILLARY
Glucose-Capillary: 120 mg/dL — ABNORMAL HIGH (ref 70–99)
Glucose-Capillary: 124 mg/dL — ABNORMAL HIGH (ref 70–99)
Glucose-Capillary: 126 mg/dL — ABNORMAL HIGH (ref 70–99)
Glucose-Capillary: 133 mg/dL — ABNORMAL HIGH (ref 70–99)
Glucose-Capillary: 145 mg/dL — ABNORMAL HIGH (ref 70–99)
Glucose-Capillary: 146 mg/dL — ABNORMAL HIGH (ref 70–99)

## 2018-07-31 LAB — MAGNESIUM: Magnesium: 2.2 mg/dL (ref 1.7–2.4)

## 2018-07-31 LAB — CBC
HCT: 23.9 % — ABNORMAL LOW (ref 36.0–46.0)
Hemoglobin: 7.4 g/dL — ABNORMAL LOW (ref 12.0–15.0)
MCH: 29.7 pg (ref 26.0–34.0)
MCHC: 31 g/dL (ref 30.0–36.0)
MCV: 96 fL (ref 80.0–100.0)
Platelets: 147 10*3/uL — ABNORMAL LOW (ref 150–400)
RBC: 2.49 MIL/uL — ABNORMAL LOW (ref 3.87–5.11)
RDW: 16.4 % — ABNORMAL HIGH (ref 11.5–15.5)
WBC: 9.4 10*3/uL (ref 4.0–10.5)
nRBC: 0.2 % (ref 0.0–0.2)

## 2018-07-31 LAB — PHOSPHORUS: Phosphorus: 5.5 mg/dL — ABNORMAL HIGH (ref 2.5–4.6)

## 2018-07-31 MED ORDER — TRAVASOL 10 % IV SOLN
INTRAVENOUS | Status: AC
  Administered 2018-07-31: 17:00:00 via INTRAVENOUS
  Filled 2018-07-31: qty 1152

## 2018-07-31 MED ORDER — FUROSEMIDE 10 MG/ML IJ SOLN
40.0000 mg | Freq: Every day | INTRAMUSCULAR | Status: DC
  Administered 2018-07-31: 40 mg via INTRAVENOUS
  Filled 2018-07-31: qty 4

## 2018-07-31 NOTE — Progress Notes (Addendum)
8 Days Post-Op  Subjective: CC: Doing well this morning. No new complaints. Notes reviewed from overnight.   Objective: Vital signs in last 24 hours: Temp:  [97.4 F (36.3 C)-98.3 F (36.8 C)] 97.9 F (36.6 C) (06/18 0339) Pulse Rate:  [58-92] 86 (06/18 0600) Resp:  [18-31] 31 (06/18 0600) BP: (84-149)/(13-76) 124/33 (06/18 0600) SpO2:  [92 %-96 %] 93 % (06/18 0600) Arterial Line BP: (126-170)/(40-69) 131/40 (06/17 1700) Weight:  [112.3 kg] 112.3 kg (06/17 0910) Last BM Date: 07/30/18  Intake/Output from previous day: 06/17 0701 - 06/18 0700 In: 1631.6 [I.V.:1446.9; IV Piggyback:184.8] Out: 1300 [Urine:500; Emesis/NG output:75; Drains:500; Stool:225] Intake/Output this shift: No intake/output data recorded.  PE: Gen: weak appearing with mumbled/slurred speech but is able to answer questions appropratley Heart: regular Lungs: Rales RML and RLL. Somewhat increase WOB but not significant and improved from yesterday. On HFNC 15L.91-94% sats while I was in the room Abd: soft, generalized tenderness without peritonitis. Normoactive BS. ileostomy with bilious outupt, more today 225cc recorded/24 hours,  JP drain stripped. SS fluid with deposits noted. 500cc/24 hours. Midline VAC in place with minimal output. See picture below from yesterdays wound vac change. Wound looks healthy. False bottom was noted. 4cm tunneling at 12 o'clock. NG tube in place. Only 75cc/24 hours recorded. This was rehooked to [facility] as it was placed in the off position and there was immediate return off bilious output.  Msk: 1+ pitting edema lower extremities b/l. Grip strength weak but equal b/l. Lifts both legs off bed weakly but equally.       Lab Results:  Recent Labs    07/30/18 0411 07/31/18 0500  WBC 22.7* 9.4  HGB 8.9* 7.4*  HCT 30.5* 23.9*  PLT 144* 147*   BMET Recent Labs    07/30/18 0411 07/31/18 0500  NA 146* 142  K 4.7 5.0  CL 110 113*  CO2 24 21*  GLUCOSE 294* 363*  BUN 97*  90*  CREATININE 2.08* 1.60*  CALCIUM 8.2* 7.8*   PT/INR No results for input(s): LABPROT, INR in the last 72 hours. CMP     Component Value Date/Time   NA 142 07/31/2018 0500   K 5.0 07/31/2018 0500   CL 113 (H) 07/31/2018 0500   CO2 21 (L) 07/31/2018 0500   GLUCOSE 363 (H) 07/31/2018 0500   BUN 90 (H) 07/31/2018 0500   CREATININE 1.60 (H) 07/31/2018 0500   CALCIUM 7.8 (L) 07/31/2018 0500   PROT 4.1 (L) 07/31/2018 0500   ALBUMIN 1.3 (L) 07/31/2018 0500   AST 23 07/31/2018 0500   ALT 13 07/31/2018 0500   ALKPHOS 51 07/31/2018 0500   BILITOT 0.2 (L) 07/31/2018 0500   GFRNONAA 30 (L) 07/31/2018 0500   GFRAA 35 (L) 07/31/2018 0500   Lipase     Component Value Date/Time   LIPASE 12 07/25/2018 0500       Studies/Results: Ct Abdomen Pelvis Wo Contrast  Result Date: 07/29/2018 CLINICAL DATA:  Colitis. Septic shock. Colectomy for ischemic colitis. Liver pressure. EXAM: CT ABDOMEN AND PELVIS WITHOUT CONTRAST TECHNIQUE: Multidetector CT imaging of the abdomen and pelvis was performed following the standard protocol without IV contrast. COMPARISON:  CT 07/22/2018 FINDINGS: Lower chest: Dense consolidation in the LEFT lower lobe with air bronchograms. Mild consolidation in LEFT lower lobe. Bilateral small effusions. Hepatobiliary: No focal hepatic lesion. Postcholecystectomy. Non IV contrast exam. Pancreas: Pancreas is normal. No ductal dilatation. No pancreatic inflammation. Spleen: Normal spleen Adrenals/urinary tract: Adrenal glands normal. No renal obstruction.  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 Bladder is mildly distended. Stomach/Bowel: Stomach is fluid-filled. The duodenum is normal. There is mild dilatation of the small bowel up to 3.2 cm. Loop of small bowel in the LEFT abdomen is edematous with bowel wall measuring up to 20 mm (image 45/5 and 51/2). No evidence of high-grade obstruction. No pneumatosis or intraperitoneal free air identified. RIGHT lower quadrant  end ileostomy noted. There is a fluid within the mesentery associated with the small bowel. One collection of fluid in the RIGHT upper quadrant measures 5.0 by 4.3 cm (image 44/2). This collection is minimal organization. There is a percutaneous drain in the lateral RIGHT abdomen paralleling the distal ileum. Vascular/Lymphatic: Abdominal aorta is normal caliber with atherosclerotic calcification. There is no retroperitoneal or periportal lymphadenopathy. No pelvic lymphadenopathy. Reproductive: Uterus and adnexa grossly normal Other: No intraperitoneal free air. Musculoskeletal: No aggressive osseous lesion. IMPRESSION: 1. Dense consolidation RIGHT lower lobe is concerning for pneumonia. 2. No evidence of bowel obstruction. Edematous small bowel in the LEFT abdomen. No pneumatosis. 3. Mild intraabdominal free fluid. One pocket of fluid in the RIGHT upper quadrant with minimal organization measures 5 cm. 4. Percutaneous drain in the RIGHT abdomen. 5. Post colectomy and end ileostomy. Electronically Signed   By: Genevive BiStewart  Edmunds [name]   On: 07/29/2018 10:56   Dg Chest 1 View  Result Date: 07/29/2018 CLINICAL DATA:  78 y/o  F; shortness of breath. EXAM: CHEST  1 VIEW COMPARISON:  07/29/2018 chest radiograph. FINDINGS: Stable cardiac silhouette given projection and technique. Aortic atherosclerosis with calcification. Stable left-greater-than-right lung base consolidations. Enteric tube extends below the field of view into the abdomen. Right PICC line tip projects over lower SVC. No pleural effusion or pneumothorax. No acute osseous abnormality is evident. IMPRESSION: Stable left-greater-than-right lung base consolidations. Right PICC line tip projects over lower SVC. Enteric tube tip extends below the field of view into the abdomen. Electronically Signed   By: Mitzi HansenLance  Furusawa-Stratton [name]   On: 07/29/2018 22:29   Dg Abd 1 View  Result Date: 07/29/2018 CLINICAL DATA:  78 y/o  F; shortness of breath. EXAM:  ABDOMEN - 1 VIEW COMPARISON:  07/29/2018 CT abdomen and pelvis and abdomen radiographs. FINDINGS: Enteric tube tip projects over the gastric body. Advanced rotatory dextrocurvature of lumbar spine. Normal included bowel gas pattern, the right abdomen is excluded. IMPRESSION: Enteric tube tip projects over the gastric body. Electronically Signed   By: Mitzi HansenLance  Furusawa-Stratton [name]   On: 07/29/2018 22:30   [place] Ekg Site Rite  Result Date: 07/29/2018 If Site Rite image not attached, placement could not be confirmed due to current cardiac rhythm.   Anti-infectives: Anti-infectives (From admission, onward)   Start     Dose/Rate Route Frequency Ordered Stop   07/29/18 0600  meropenem (MERREM) 1 g in sodium chloride 0.9 % 100 mL IVPB     1 g 200 mL/hr over 30 Minutes Intravenous Every 12 hours 07/29/18 0457     07/29/18 0500  ceFEPIme (MAXIPIME) 2 g in sodium chloride 0.9 % 100 mL IVPB  Status:  Discontinued     2 g 200 mL/hr over 30 Minutes Intravenous Every 24 hours 07/29/18 0436 07/29/18 0457   07/26/18 1400  piperacillin-tazobactam (ZOSYN) IVPB 3.375 g  Status:  Discontinued     3.375 g 12.5 mL/hr over 240 Minutes Intravenous Every 6 hours 07/26/18 0906 07/26/18 1339   07/26/18 1400  cefTRIAXone (ROCEPHIN) 1 g in sodium chloride 0.9 % 100 mL IVPB  Status:  Discontinued     1 g 200 mL/hr over 30 Minutes Intravenous Every 24 hours 07/26/18 1339 07/29/18 0436   07/23/18 0200  piperacillin-tazobactam (ZOSYN) IVPB 2.25 g  Status:  Discontinued     2.25 g 100 mL/hr over 30 Minutes Intravenous Every 6 hours 07/22/18 2248 07/26/18 0906   07/22/18 1800  piperacillin-tazobactam (ZOSYN) IVPB 3.375 g  Status:  Discontinued     3.375 g 12.5 mL/hr over 240 Minutes Intravenous Every 8 hours 07/22/18 0953 07/22/18 2248   07/22/18 1000  piperacillin-tazobactam (ZOSYN) IVPB 3.375 g     3.375 g 100 mL/hr over 30 Minutes Intravenous  Once 07/22/18 0953 07/22/18 1239   07/22/18 0745  ciprofloxacin (CIPRO) IVPB  400 mg     400 mg 200 mL/hr over 60 Minutes Intravenous  Once 07/22/18 0739 07/22/18 0921   07/22/18 0745  metroNIDAZOLE (FLAGYL) IVPB 500 mg     500 mg 100 mL/hr over 60 Minutes Intravenous  Once 07/22/18 0739 07/22/18 1024       Assessment/Plan Type 2 diabetes Hypothyroid Rheumatoid arthritis Struct of sleep apnea Morbid obesity BMI 36.58 AKI - Cr trending down 1.60, good UOP Malnutrition - prealbumin<5, on TPN Klebsiella UTI VDRF, per CCM, weaning today well per RN Thrombocytopenia - plts 147K today  Septic shock Ischemic colitis, acute cholecystitis POD 8,S/pExploratory laparotomy, total abdominal colectomy, [name], cholecystectomy-6/10-Dr. Derrell Lolling - CTAP 6/16 w/out intra-abdominal cause of fever and elevated WBC. There is noted RLL PNA. Currently on Meropnem per CCM - cont VAC changes - having good bowel function currently.  - Speech to perform swallow study when more stable and ready for PO - cont JP  - Cont TPN.   ID -Currently Meropnem, WBC 9,400 FEN -IVF, TPN, NPO VTE -SCDs, Hgb dropped from 8.9 -> 7.4  Foley -in place Follow up -Dr. Marnette Burgess - son Onalee Hua [phone], husband Henreitta Cea [phone]   LOS: 9 days    Jacinto Halim , [facility] 07/31/2018, 8:25 AM Pager: [phone]

## 2018-07-31 NOTE — Progress Notes (Signed)
NAME:  Hailey Hughes, MRN:  [id number], DOB:  03/28/1940, LOS: 9 ADMISSION DATE:  07/22/2018, CONSULTATION DATE: 07/22/2018 REFERRING MD: Raphael Gibney MD, CHIEF COMPLAINT: Colitis  Brief History   Patient is an 78 year old white female admitted the hospital 6/9 with CT scan evidence of colitis transferred to the ICU  for septic shock Underwent total colectomy for ischemic colitis  Past Medical History  Prior multiple abdominal surgeries, hypothyroidism, type 2 diabetes rheumatoid arthritis, obstructive sleep apnea  Significant Hospital Events   Admission 07/22/2018 transferred to ICU for hypotension 6/10: Received multiple boluses of IV crystalloid remained hypotensive requiring phenylephrine infusion.  Started on vasopressin on top of phenylephrine, central line placed.  Patient did have episode of vomiting.  Developed worsening work of breathing, desaturation, and worsening metabolic acidosis Ultimately required intubation.  Lactic acid initially slightly improved from peak of 5.4 down to 3.5. having exquisite abdominal pain, surgical services consulted->went for emergent exploratory lap, found to have ischemic colitis and possibly ischemic GB she underwent total colectomy w/ ileostomy placement as well as cholecystectomy. She returned to the ICU w/ three vasoactive gtts and bicarb gtt doubled. PCCM asked to resume care.  6/11: Acid-base improving, vasoactive drip requirements decreased, chest x-ray worse with worsening airspace disease and right effusion, decreasing IV fluid rate PEEP adjusted up to facilitate weaning FiO2 starting TNA 6/12.  Vasopressin off.  Weaning norepinephrine to off.  Starting diuresis.  Continuing antibiotics, TNA, and supportive care.  Discontinuing bicarbonate infusion.  Overall improving working towards pressure support ventilation 6/13 still requiring PEEP of 8, 50% 6/14 Concern for aspiration of bilious secretions overnight with increasing hypoxia to 70% FiO2, remains  on PEEP of 8 6/15 Extubated  6/16  developed fever and hypotension,  pressors gradually increased through the night to Levophed at 40 mics, Developed altered mental status head CT was negative >> attributed to Dilaudid  Consults:  Critical care 6/9 General surgery consulted 6/10  Procedures:  ETT 6/10 >>6/15  CVL left subclavian 6/10 >>6/16 RUE PICC 6/16 >> Arterial line 6/10 >> out RT radial a line 6/16 >>  Significant Diagnostic Tests:  Head CT 6/ 15 >> neg 6/16 CT abd/ pelvis >> Dense consolidation RIGHT lower lobe  No evidence of bowel obstruction. Edematous small bowel in the LEFT abdomen. No pneumatosis.  Mild intraabdominal free fluid. One pocket of fluid in the RIGHT upper quadrant with minimal organization measures 5 cm.  Micro Data:  MRSA screen 6/9: neg BCX2 6/9>>>ng COVID-19 6/9: neg Urine culture 6/9: Klebsiella pneumoniae and sensitive to Rocephin Blood 6/16 >>ng  Antimicrobials:  Zosyn 6/9-6/13 Rocephin 6/13 >>6/16 Meropenem 6/16 >>  Interim history/subjective:   She is off pressors but remains critically ill on high flow nasal cannula NG tube with bilious output Afebrile very weak and deconditioned    Objective   Blood pressure (!) 124/33, pulse 86, temperature (!) 97.5 F (36.4 C), temperature source Oral, resp. rate (!) 31, height 5\' 2"  (1.575 m), weight 112.3 kg, SpO2 93 %.        Intake/Output Summary (Last 24 hours) at 07/31/2018 0925 Last data filed at 07/31/2018 08/02/2018 Gross per 24 hour  Intake 1491 ml  Output 1250 ml  Net 241 ml   Filed Weights   07/22/18 0148 07/24/18 1132 07/30/18 0910  Weight: 90.7 kg 108.5 kg 112.3 kg   Examination: Elderly lady, obese, mild distress, on high flow nasal cannula Mild pallor, no icterus , bilious drainage from NG tube Decreased breath sounds bilateral,  no rhonchi S1-S2 regular, sinus on monitor Abdomen is soft, no tenderness, no rebound, bilious drainage from ileostomy, ileostomy appears pink  Extremities shows no clubbing, has 1+ edema Awake interactive, nonfocal   Chest x-ray 6/16 personally reviewed -stable bibasilar infiltrates  Resolved Hospital Problem list   Hyperkalemia Elevated Lipase (normalized 6/12) Non-gap and non-anion gap metabolic acidosis Lactic acidosis  Assessment & Plan:    Acute Hypoxemic respiratory failure -Aspiration pneumonia -Pleural effusions -  clearing with diuresis -Extubated 6/15 but requiring high flow oxygen due to aspiration/bibasal pneumonia -Dial down FiO2, accept saturation 92% and above   Septic shock secondary to ischemic colitis and bacterial translocation Klebsiella UTI -Body fluid culture negative -urine positive for Klebsiella 6/16 worsening septic shock, broadened antibiotics to meropenem, now off pressors again    Acute kidney injury -baseline cr 1.3 in 2015, improving close to baseline -Renal dose medications, holding benazepril -Strict input output -Trend electrolytes -Resume Lasix 40 IV  Protein calorie malnutrition, mild --Continue TNA -Can we start trickle tube feeds?  Defer to surgery -Swallow evaluation ongoing  Diabetes SSI -Dc D5W , high sugar on bmet likely because blood drawn from side of dextrose line  Hypothyroidism -Continue Synthroid  Pain -altered mental status with Dilaudid, use lower doses Trial of IV Tylenol Discontinue Robaxin  Summary - CT abdomen and low lactate reassuring but ongoing concern for ischemic bowel .  She needs swallow evaluation before advancing oral in any form, shock has resolved, hypoxia slowly improving.  She is very deconditioned and needs aggressive PT will likely need SNF  Best practice:  Diet: NPO, on TPN Pain/Anxiety/Delirium protocol (if indicated): na VAP protocol (if indicated): na DVT prophylaxis: scd GI prophylaxis: protonix Glucose control: sliding scale Mobility: bed Code Status: full   The patient is critically ill with multiple organ systems  failure and requires high complexity decision making for assessment and support, frequent evaluation and titration of therapies, application of advanced monitoring technologies and extensive interpretation of multiple databases. Critical Care Time devoted to patient care services described in this note independent of APP/resident  time is 31 minutes.     Kara Mead MD. Shade Flood. [place] Pager [phone] If no response call [phone]   07/31/2018

## 2018-07-31 NOTE — Progress Notes (Signed)
PHARMACY - ADULT TOTAL PARENTERAL NUTRITION CONSULT NOTE   Pharmacy Consult for TPN Indication: s/p massive bowel resection  Patient Measurements: Height: 5\' 2"  (157.5 cm) Weight: 247 lb 9.2 oz (112.3 kg) IBW/kg (Calculated) : 50.1 TPN AdjBW (KG): 60.3 Body mass index is 45.28 kg/m.  Insulin Requirements: 20 units moderate scale SSI last 24hr  Current Nutrition: NPO  IVF: D5W @ 32ml/hr  Central access: PICC TPN start date: 6/11  ASSESSMENT                                                                                                          HPI: 78 yo female s/p total abdominal colectomy, brooke ileostomy and cholecystecomy on 6/10 to start TPN.   Significant events:   Today, 07/31/18  Glucose (goal CBGs < 150): 111-169; CBGs at goal TPN bag hung yesterday and pressors discontinued.  Note hx DM on metformin PTA.  Electrolytes - all WNL except Phos elevated at 5.5.  Renal - SCr 1.6, slowly improving.  Good UOP.  LFTs - AST/ALT WNL  TGs - 225 (6/12), 176 (6/15)  Prealbumin - 5 (6/12), <5 (6/15)  NUTRITIONAL GOALS                                                                                             RD recs: 2100-2300 kcal, 100-115 grams protein.   *recommendations updated 6/17 to increase Kcal goals now that patient off ventilator   Custom TPN at a new goal rate of 41ml/hr to provide  115 g/day protein and 2081 kcal/day.  PLAN                                                                                                                         At 1800 today:  Continue custom TPN- see above for new goals. Change TPN to goal 80 ml/hr - protein 60g/L, dextrose 16%, and lipid 30g/L  TPN will have standard concentrations of electrolytes except: decrease phos to 5, decrease K to 35, Ca at 10, Na to 30, YB:OFBP 1:2  TPN to contain standard multivitamins daily and trace elements MWF only due to national shortage  IVF per MD  Continue SSI/CBGs q4h, increase  insulin in TPN to 40 units.  TPN lab panels on Mondays & Thursdays. BMET, Mag, Phos in am.   Junita Push, PharmD, BCPS 07/31/2018 7:54 AM

## 2018-07-31 NOTE — Progress Notes (Signed)
SLP Cancellation Note  Patient Details Name: MAYZEE REICHENBACH MRN: [id number] DOB: May 15, 1940   Cancelled treatment:       Reason Eval/Treat Not Completed: Other (comment);Medical issues which prohibited therapy(pt continues with NG in place and per RN it is suctioning contents, will continue efforts)   Macario Golds 07/31/2018, 11:22 AM  Luanna Salk, [place] SLP [place] Pager [phone] Office [phone]

## 2018-08-01 LAB — GLUCOSE, CAPILLARY
Glucose-Capillary: 124 mg/dL — ABNORMAL HIGH (ref 70–99)
Glucose-Capillary: 126 mg/dL — ABNORMAL HIGH (ref 70–99)
Glucose-Capillary: 141 mg/dL — ABNORMAL HIGH (ref 70–99)
Glucose-Capillary: 143 mg/dL — ABNORMAL HIGH (ref 70–99)
Glucose-Capillary: 147 mg/dL — ABNORMAL HIGH (ref 70–99)
Glucose-Capillary: 169 mg/dL — ABNORMAL HIGH (ref 70–99)

## 2018-08-01 LAB — BASIC METABOLIC PANEL
Anion gap: 9 (ref 5–15)
BUN: 101 mg/dL — ABNORMAL HIGH (ref 8–23)
CO2: 23 mmol/L (ref 22–32)
Calcium: 8.5 mg/dL — ABNORMAL LOW (ref 8.9–10.3)
Chloride: 116 mmol/L — ABNORMAL HIGH (ref 98–111)
Creatinine, Ser: 1.67 mg/dL — ABNORMAL HIGH (ref 0.44–1.00)
GFR calc Af Amer: 33 mL/min — ABNORMAL LOW (ref >60)
GFR calc non Af Amer: 29 mL/min — ABNORMAL LOW (ref >60)
Glucose, Bld: 165 mg/dL — ABNORMAL HIGH (ref 70–99)
Potassium: 3.8 mmol/L (ref 3.5–5.1)
Sodium: 148 mmol/L — ABNORMAL HIGH (ref 135–145)

## 2018-08-01 LAB — CBC
HCT: 28.4 % — ABNORMAL LOW (ref 36.0–46.0)
Hemoglobin: 8.9 g/dL — ABNORMAL LOW (ref 12.0–15.0)
MCH: 29.2 pg (ref 26.0–34.0)
MCHC: 31.3 g/dL (ref 30.0–36.0)
MCV: 93.1 fL (ref 80.0–100.0)
Platelets: 198 10*3/uL (ref 150–400)
RBC: 3.05 MIL/uL — ABNORMAL LOW (ref 3.87–5.11)
RDW: 16.1 % — ABNORMAL HIGH (ref 11.5–15.5)
WBC: 9.1 10*3/uL (ref 4.0–10.5)
nRBC: 0.3 % — ABNORMAL HIGH (ref 0.0–0.2)

## 2018-08-01 LAB — PHOSPHORUS: Phosphorus: 4.6 mg/dL (ref 2.5–4.6)

## 2018-08-01 LAB — MAGNESIUM: Magnesium: 2.4 mg/dL (ref 1.7–2.4)

## 2018-08-01 MED ORDER — FUROSEMIDE 10 MG/ML IJ SOLN
40.0000 mg | Freq: Once | INTRAMUSCULAR | Status: AC
  Administered 2018-08-01: 40 mg via INTRAVENOUS
  Filled 2018-08-01: qty 4

## 2018-08-01 MED ORDER — TRAVASOL 10 % IV SOLN
INTRAVENOUS | Status: AC
  Administered 2018-08-01: 18:00:00 via INTRAVENOUS
  Filled 2018-08-01: qty 1152

## 2018-08-01 MED ORDER — TIZANIDINE HCL 4 MG PO TABS
4.0000 mg | ORAL_TABLET | Freq: Once | ORAL | Status: AC
  Administered 2018-08-01: 4 mg via ORAL
  Filled 2018-08-01: qty 1

## 2018-08-01 NOTE — Progress Notes (Signed)
Physical Therapy Treatment Patient Details Name: LIDDIE CHICHESTER MRN: [id number] DOB: Jul 23, 1940 Today's Date: 08/01/2018    History of Present Illness 78 year old white female admitted the hospital 6/9 with CT scan evidence of colitis transferred to the ICU  for septic shock, s/p colectomy and cholecystectomy 07/23/18    PT Comments    Assisted pt to roll to side for pericare, pt had ongoing diarrhea for ~10 minutes. RN present to address this issue. Treatment limited due to uncontrolled diarrhea. Will follow.   Follow Up Recommendations  SNF     Equipment Recommendations  Other (comment)(TBD at next venue)    Recommendations for Other Services       Precautions / Restrictions Precautions Precautions: Fall Precaution Comments: multiple lines, JP drain on L, A Line and PICC RUE, abdominal wound VAC Restrictions Weight Bearing Restrictions: No    Mobility  Bed Mobility   Bed Mobility: Rolling Rolling: +2 for physical assistance;Total assist            Transfers                 General transfer comment: did not attempt, pt having ongoing diarrhea in bed  Ambulation/Gait                 Stairs             Wheelchair Mobility    Modified Rankin (Stroke Patients Only)       Balance                                            Cognition Arousal/Alertness: Awake/alert Behavior During Therapy: WFL for tasks assessed/performed Overall Cognitive Status: Within Functional Limits for tasks assessed                                        Exercises General Exercises - Lower Extremity Ankle Circles/Pumps: AROM;Both;15 reps;Supine    General Comments        Pertinent Vitals/Pain Faces Pain Scale: Hurts even more Pain Location: abdomen with rolling Pain Descriptors / Indicators: Sore Pain Intervention(s): Limited activity within patient's tolerance;Monitored during session;Premedicated before session     Home Living                      Prior Function            PT Goals (current goals can now be found in the care plan section) Acute Rehab PT Goals Patient Stated Goal: to get stronger PT Goal Formulation: With patient Time For Goal Achievement: 08/15/18 Potential to Achieve Goals: Fair Progress towards PT goals: Progressing toward goals    Frequency    Min 2X/week      PT Plan Current plan remains appropriate    Co-evaluation              AM-PAC PT "6 Clicks" Mobility   Outcome Measure  Help needed turning from your back to your side while in a flat bed without using bedrails?: Total Help needed moving from lying on your back to sitting on the side of a flat bed without using bedrails?: Total Help needed moving to and from a bed to a chair (including a wheelchair)?: Total Help needed standing up from a chair using your arms (e.g.,  wheelchair or bedside chair)?: Total Help needed to walk in hospital room?: Total Help needed climbing 3-5 steps with a railing? : Total 6 Click Score: 6    End of Session Equipment Utilized During Treatment: Oxygen Activity Tolerance: Patient limited by fatigue;Treatment limited secondary to medical complications (Comment)(continuous diarrhea) Patient left: in bed;with call bell/phone within reach;with nursing/sitter in room Nurse Communication: Mobility status PT Visit Diagnosis: Unsteadiness on feet (R26.81);Difficulty in walking, not elsewhere classified (R26.2);Muscle weakness (generalized) (M62.81);Pain     Time: 7902-4097 PT Time Calculation (min) (ACUTE ONLY): 13 min  Charges:  $Therapeutic Activity: 8-22 mins                     Ralene Bathe Kistler PT 08/01/2018  Acute Rehabilitation Services Pager [phone] Office [phone]

## 2018-08-01 NOTE — Progress Notes (Signed)
eLink Physician-Brief Progress Note Patient Name: Hailey Hughes DOB: [birth year] MRN: [id number]   Date of Service  08/01/2018  HPI/Events of Note  Patient c/o muscle spasm.  eICU Interventions  Will order: 1. Zansflex 4 mg per tube X 1 now.      Intervention Category Major Interventions: Other:  Lysle Dingwall 08/01/2018, 12:58 AM

## 2018-08-01 NOTE — Progress Notes (Signed)
Occupational Therapy Treatment Patient Details Name: Hailey Hughes MRN: [id number] DOB: Jun 06, 1940 Today's Date: 08/01/2018    History of present illness 78 year old white female admitted the hospital 6/9 with CT scan evidence of colitis transferred to the ICU  for septic shock, s/p colectomy and cholecystectomy 07/23/18   OT comments  Pt is making progress in OT. Seen at bed level for self feeding ice with cup and built up spoon, grooming, UE AAROM shoulder exercise and rolling/repositioning.  Requested soft touch call bell. Edema is reduced but remains present   Follow Up Recommendations  SNF    Equipment Recommendations  (too early for [facility])    Recommendations for Other Services      Precautions / Restrictions Precautions Precautions: Fall Precaution Comments: multiple lines, JP drain on L, A Line and PICC RUE, abdominal wound VAC Restrictions Weight Bearing Restrictions: No       Mobility Bed Mobility   Bed Mobility: Rolling Rolling: Total assist;+2 for physical assistance         General bed mobility comments: pt 10%.  Rolled to reposition with RN assistance. Also pulled pt up in bed  Transfers                 General transfer comment: did not attempt, pt having ongoing diarrhea in bed    Balance                                           ADL either performed or assessed with clinical judgement   ADL Overall ADL's : Needs assistance/impaired Eating/Feeding: Minimal assistance Eating/Feeding Details (indicate cue type and reason): pt self feeding ice with RUE.  Able to manage cup, and gave her built up foam to use L to hold cup and R to scoop.  Encouraged small amounts. Pt coughs   Grooming Details (indicate cue type and reason): mod A for hair and lotion on hands                                     Vision       Perception     Praxis      Cognition Arousal/Alertness: Awake/alert Behavior During Therapy:  WFL for tasks assessed/performed Overall Cognitive Status: Within Functional Limits for tasks assessed                                          Exercises Exercises: Other exercises General Exercises - Lower Extremity Ankle Circles/Pumps: AROM;Both;15 reps;Supine Quad Sets: AROM;Both;5 reps;Supine Short Arc Quad: AROM;Both;10 reps;Supine Heel Slides: AAROM;Both;10 reps;Supine Hip ABduction/ADduction: AAROM;Both;10 reps;Supine Other Exercises Other Exercises: 2 sets of 5 each hand FF to  90 degrees   Shoulder Instructions       General Comments requested soft touch call bell.  Continues to have soft BP    Pertinent Vitals/ Pain       Pain Assessment: Faces Faces Pain Scale: Hurts little more Pain Location: abdomen Pain Descriptors / Indicators: Sore Pain Intervention(s): Limited activity within patient's tolerance;Monitored during session;Repositioned  Home Living  Prior Functioning/Environment              Frequency  Min 2X/week        Progress Toward Goals  OT Goals(current goals can now be found in the care plan section)  Progress towards OT goals: Progressing toward goals  Acute Rehab OT Goals Patient Stated Goal: to get stronger  Plan      Co-evaluation                 AM-PAC OT "6 Clicks" Daily Activity     Outcome Measure   Help from another person eating meals?: A Little Help from another person taking care of personal grooming?: A Lot Help from another person toileting, which includes using toliet, bedpan, or urinal?: Total Help from another person bathing (including washing, rinsing, drying)?: Total Help from another person to put on and taking off regular upper body clothing?: Total Help from another person to put on and taking off regular lower body clothing?: Total 6 Click Score: 9    End of Session    OT Visit Diagnosis: Muscle weakness (generalized)  (M62.81);Unsteadiness on feet (R26.81)   Activity Tolerance Patient tolerated treatment well   Patient Left in bed;with call bell/phone within reach;with bed alarm set   Nurse Communication          Time: 7517-0017 OT Time Calculation (min): 45 min  Charges: OT General Charges $OT Visit: 1 Visit OT Treatments $Self Care/Home Management : 8-22 mins $Therapeutic Activity: 8-22 mins $Therapeutic Exercise: 8-22 mins  Marica Otter, OTR/L Acute Rehabilitation Services [phone] [facility] pager [phone] office 08/01/2018   Aakash Hollomon 08/01/2018, 3:36 PM

## 2018-08-01 NOTE — Progress Notes (Signed)
NAME:  Hailey Hughes, MRN:  [id number], DOB:  04-Jun-1940, LOS: 51 ADMISSION DATE:  07/22/2018, CONSULTATION DATE: 07/22/2018 REFERRING MD: Francia Greaves MD, CHIEF COMPLAINT: Colitis  Brief History   Patient is an 78 year old white female admitted the hospital 6/9 with CT scan evidence of colitis transferred to the ICU  for septic shock Underwent total colectomy for ischemic colitis  Past Medical History  Prior multiple abdominal surgeries, hypothyroidism, type 2 diabetes rheumatoid arthritis, obstructive sleep apnea  Significant Hospital Events   Admission 07/22/2018 transferred to ICU for hypotension 6/10: Received multiple boluses of IV crystalloid remained hypotensive requiring phenylephrine infusion.  Started on vasopressin on top of phenylephrine, central line placed.  Patient did have episode of vomiting.  Developed worsening work of breathing, desaturation, and worsening metabolic acidosis Ultimately required intubation.  Lactic acid initially slightly improved from peak of 5.4 down to 3.5. having exquisite abdominal pain, surgical services consulted->went for emergent exploratory lap, found to have ischemic colitis and possibly ischemic GB she underwent total colectomy w/ ileostomy placement as well as cholecystectomy. She returned to the ICU w/ three vasoactive gtts and bicarb gtt doubled. PCCM asked to resume care.  6/11: Acid-base improving, vasoactive drip requirements decreased, chest x-ray worse with worsening airspace disease and right effusion, decreasing IV fluid rate PEEP adjusted up to facilitate weaning FiO2 starting TNA 6/12.  Vasopressin off.  Weaning norepinephrine to off.  Starting diuresis.  Continuing antibiotics, TNA, and supportive care.  Discontinuing bicarbonate infusion.  Overall improving working towards pressure support ventilation 6/13 still requiring PEEP of 8, 50% 6/14 Concern for aspiration of bilious secretions overnight with increasing hypoxia to 70% FiO2,  remains on PEEP of 8 6/15 Extubated  6/16  developed fever and hypotension,  pressors gradually increased through the night to Levophed at 40 mics, Developed altered mental status head CT was negative >> attributed to Dilaudid  Consults:  General surgery consulted 6/10  Procedures:  ETT 6/10 >>6/15  CVL left subclavian 6/10 >>6/16 RUE PICC 6/16 >> Arterial line 6/10 >> out RT radial a line 6/16 >> 6/19  Significant Diagnostic Tests:  Head CT 6/ 15 >> neg 6/16 CT abd/ pelvis >> Dense consolidation RIGHT lower lobe  No evidence of bowel obstruction. Edematous small bowel in the LEFT abdomen. No pneumatosis.  Mild intraabdominal free fluid. One pocket of fluid in the RIGHT upper quadrant with minimal organization measures 5 cm.  Micro Data:  MRSA screen 6/9: neg BCX2 6/9>>>ng COVID-19 6/9: neg Urine culture 6/9: Klebsiella pneumoniae and sensitive to Rocephin Blood 6/16 >>ng  Antimicrobials:  Zosyn 6/9-6/13 Rocephin 6/13 >>6/16 Meropenem 6/16 >>  Interim history/subjective:  Denies chest pain.  Breathing slowly improving.  Denies nausea.  Objective   Blood pressure (!) 114/45, pulse 81, temperature 97.7 F (36.5 C), temperature source Oral, resp. rate (!) 25, height 5\' 2"  (1.575 m), weight 107.2 kg, SpO2 95 %.        Intake/Output Summary (Last 24 hours) at 08/01/2018 0833 Last data filed at 08/01/2018 0655 Gross per 24 hour  Intake 2306.13 ml  Output 4425 ml  Net -2118.87 ml   Filed Weights   07/24/18 1132 07/30/18 0910 07/31/18 1016  Weight: 108.5 kg 112.3 kg 107.2 kg   Examination:  General - alert Eyes - pupils reactive ENT - no sinus tenderness, no stridor, NG tube in Cardiac - regular rate/rhythm, no murmur Chest - b/l rhonchi Abdomen - soft, ileostomy in place, wound vac in place Extremities - 2+ edema Skin -  no rashes Neuro - follows commands, moves extremities   Resolved Hospital Problem list   Hyperkalemia Elevated Lipase (normalized 6/12)  Non-gap and non-anion gap metabolic acidosis Lactic acidosis Septic shock AKI  Assessment & Plan:    Acute Hypoxemic respiratory failure from aspiration pneumonia and pulmonary edema. Plan - oxygen to keep SpO2 > 92% - f/u CXR intermittently - lasix 40 mg IV x one 6/19  Sepsis from ischemic colitis and bacterial translocation, acute cholecystitis, and aspiration pneumonia. Klebsiella UTI. Plan - continue ABx  S/p laparotomy, total colectomy, [name], cholecystectomy. Plan - post op care per surgery  Moderate protein calorie malnutrition. Plan - continue TNA - f/u with speech therapy and then try to advance diet if okay with surgery - d/c NG tube when okay with surgery  Diabetes. Plan - SSI  Hypothyroidism. Plan -Continue Synthroid  Deconditioning. Plan - PT/OT >> recommending SNF when stable  Best practice:  Diet: NPO, on TPN Pain/Anxiety/Delirium protocol (if indicated): na VAP protocol (if indicated): na DVT prophylaxis: scd GI prophylaxis: protonix Glucose control: sliding scale Mobility: bed Code Status: full  Labs:   CMP Latest Ref Rng & Units 08/01/2018 07/31/2018 07/30/2018  Glucose 70 - 99 mg/dL 854(O) 270(J) 500(X)  BUN 8 - 23 mg/dL 381(W) 29(H) 37(J)  Creatinine 0.44 - 1.00 mg/dL 6.96(V) 8.93(Y) 1.01(B)  Sodium 135 - 145 mmol/L 148(H) 142 146(H)  Potassium 3.5 - 5.1 mmol/L 3.8 5.0 4.7  Chloride 98 - 111 mmol/L 116(H) 113(H) 110  CO2 22 - 32 mmol/L 23 21(L) 24  Calcium 8.9 - 10.3 mg/dL 5.1(W) 7.8(L) 8.2(L)  Total Protein 6.5 - 8.1 g/dL - 4.1(L) -  Total Bilirubin 0.3 - 1.2 mg/dL - 2.5(E) -  Alkaline Phos 38 - 126 U/L - 51 -  AST 15 - 41 U/L - 23 -  ALT 0 - 44 U/L - 13 -   CBC Latest Ref Rng & Units 08/01/2018 07/31/2018 07/30/2018  WBC 4.0 - 10.5 K/uL 9.1 9.4 22.7(H)  Hemoglobin 12.0 - 15.0 g/dL 5.2(D) 7.4(L) 8.9(L)  Hematocrit 36.0 - 46.0 % 28.4(L) 23.9(L) 30.5(L)  Platelets 150 - 400 K/uL 198 147(L) 144(L)   ABG    Component  Value Date/Time   PHART 7.360 07/29/2018 1139   PCO2ART 46.8 07/29/2018 1139   PO2ART 69.5 (L) 07/29/2018 1139   HCO3 25.8 07/29/2018 1139   TCO2 17 (L) 07/23/2018 1217   ACIDBASEDEF 1.6 07/24/2018 0302   O2SAT 93.0 07/29/2018 1139   CBG (last 3)  Recent Labs    07/31/18 2332 08/01/18 0344 08/01/18 0739  GLUCAP 146* 147* 124*    [name], MD [facility] 08/01/2018, 8:40 AM

## 2018-08-01 NOTE — Progress Notes (Signed)
  Speech Language Pathology Treatment: Dysphagia  Patient Details Name: Hailey Hughes MRN: [id number] DOB: 29-Jun-1940 Today's Date: 08/01/2018 Time: 2706-2376 SLP Time Calculation (min) (ACUTE ONLY): 25 min  Assessment / Plan / Recommendation Clinical Impression  Patient seen to address dysphagia goals as she has been NPO except for ice chips and water PRN. NG tube has been removed by RN as of today. Patient continues to exhibit SOB, SpO2 in high 90%'s, RR in low 20's and continues to be on O2 via nasal cannula. She consumed straw sips of thin liquids (water) with intermittent coughing, but difficult to differentiate from coughing of phlegm/secretions that she has been doing frequently when not taking PO's. No significant changes in vital signs with PO intake of thin liquids and puree solids. As patient cannot have MBS due to contraindications of barium s/p cholectomy, plan is to initiate clear liquids (thin) and closely monitor patient for s/s of aspiration/penetration.    HPI HPI: Patient is an 78 y.o. 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 She was admitted under primary diagnosis of sepsis secondary to generalized colitis, also with UTI. She underwent a colectomy and cholecystectomy on 07/23/18 and was intubated from 6/10-6/15.      SLP Plan  Continue with current plan of care       Recommendations  Diet recommendations: Thin liquid Liquids provided via: Cup;Straw Medication Administration: Crushed with puree Supervision: Full supervision/cueing for compensatory strategies Compensations: Minimize environmental distractions;Slow rate;Small sips/bites Postural Changes and/or Swallow Maneuvers: Seated upright 90 degrees                Oral Care Recommendations: Oral care QID Follow up  Recommendations: Other (comment) SLP Visit Diagnosis: Dysphagia, unspecified (R13.10) Plan: Continue with current plan of care       GO                Dannial Monarch 08/01/2018, 4:48 PM   Sonia Baller, MA, [place] Acute Rehab

## 2018-08-01 NOTE — Progress Notes (Signed)
9 Days Post-Op  Subjective: CC: Doing well this morning. No new complaints. Notes reviewed from overnight.   Objective: Vital signs in last 24 hours: Temp:  [97.7 F (36.5 C)-98.9 F (37.2 C)] 97.7 F (36.5 C) (06/19 0803) Pulse Rate:  [78-98] 80 (06/19 1000) Resp:  [15-37] 25 (06/19 1000) BP: (87-179)/(28-91) 127/38 (06/19 1000) SpO2:  [91 %-98 %] 93 % (06/19 1000) Arterial Line BP: (116-167)/(44-60) 140/44 (06/19 1000) Last BM Date: 07/31/18  Intake/Output from previous day: 06/18 0701 - 06/19 0700 In: 2306.1 [I.V.:2107.1; IV Piggyback:199] Out: 4425 [Urine:2250; Emesis/NG output:1450; Drains:275; Stool:450] Intake/Output this shift: No intake/output data recorded.  PE: Gen: weak appearing with mumbled/slurred speech but is able to answer questions appropratley Abd: soft, generalized tenderness without peritonitis. Normoactive BS. ileostomy with bilious outupt recorded/24 hours,  JP drain stripped. SS fluid with deposits noted. 500cc/24 hours. Midline VAC in place with minimal output. See picture below from yesterdays wound vac change.   NG tube in place.  Min clear output Msk: 1+ pitting edema lower extremities b/l.      Lab Results:  Recent Labs    07/31/18 0500 08/01/18 0457  WBC 9.4 9.1  HGB 7.4* 8.9*  HCT 23.9* 28.4*  PLT 147* 198   BMET Recent Labs    07/31/18 0500 08/01/18 0457  NA 142 148*  K 5.0 3.8  CL 113* 116*  CO2 21* 23  GLUCOSE 363* 165*  BUN 90* 101*  CREATININE 1.60* 1.67*  CALCIUM 7.8* 8.5*   PT/INR No results for input(s): LABPROT, INR in the last 72 hours. CMP     Component Value Date/Time   NA 148 (H) 08/01/2018 0457   K 3.8 08/01/2018 0457   CL 116 (H) 08/01/2018 0457   CO2 23 08/01/2018 0457   GLUCOSE 165 (H) 08/01/2018 0457   BUN 101 (H) 08/01/2018 0457   CREATININE 1.67 (H) 08/01/2018 0457   CALCIUM 8.5 (L) 08/01/2018 0457   PROT 4.1 (L) 07/31/2018 0500   ALBUMIN 1.3 (L) 07/31/2018 0500   AST 23 07/31/2018  0500   ALT 13 07/31/2018 0500   ALKPHOS 51 07/31/2018 0500   BILITOT 0.2 (L) 07/31/2018 0500   GFRNONAA 29 (L) 08/01/2018 0457   GFRAA 33 (L) 08/01/2018 0457   Lipase     Component Value Date/Time   LIPASE 12 07/25/2018 0500       Studies/Results: No results found.  Anti-infectives: Anti-infectives (From admission, onward)   Start     Dose/Rate Route Frequency Ordered Stop   07/29/18 0600  meropenem (MERREM) 1 g in sodium chloride 0.9 % 100 mL IVPB     1 g 200 mL/hr over 30 Minutes Intravenous Every 12 hours 07/29/18 0457     07/29/18 0500  ceFEPIme (MAXIPIME) 2 g in sodium chloride 0.9 % 100 mL IVPB  Status:  Discontinued     2 g 200 mL/hr over 30 Minutes Intravenous Every 24 hours 07/29/18 0436 07/29/18 0457   07/26/18 1400  piperacillin-tazobactam (ZOSYN) IVPB 3.375 g  Status:  Discontinued     3.375 g 12.5 mL/hr over 240 Minutes Intravenous Every 6 hours 07/26/18 0906 07/26/18 1339   07/26/18 1400  cefTRIAXone (ROCEPHIN) 1 g in sodium chloride 0.9 % 100 mL IVPB  Status:  Discontinued     1 g 200 mL/hr over 30 Minutes Intravenous Every 24 hours 07/26/18 1339 07/29/18 0436   07/23/18 0200  piperacillin-tazobactam (ZOSYN) IVPB 2.25 g  Status:  Discontinued  2.25 g 100 mL/hr over 30 Minutes Intravenous Every 6 hours 07/22/18 2248 07/26/18 0906   07/22/18 1800  piperacillin-tazobactam (ZOSYN) IVPB 3.375 g  Status:  Discontinued     3.375 g 12.5 mL/hr over 240 Minutes Intravenous Every 8 hours 07/22/18 0953 07/22/18 2248   07/22/18 1000  piperacillin-tazobactam (ZOSYN) IVPB 3.375 g     3.375 g 100 mL/hr over 30 Minutes Intravenous  Once 07/22/18 0953 07/22/18 1239   07/22/18 0745  ciprofloxacin (CIPRO) IVPB 400 mg     400 mg 200 mL/hr over 60 Minutes Intravenous  Once 07/22/18 0739 07/22/18 0921   07/22/18 0745  metroNIDAZOLE (FLAGYL) IVPB 500 mg     500 mg 100 mL/hr over 60 Minutes Intravenous  Once 07/22/18 0739 07/22/18 1024       Assessment/Plan Type 2  diabetes Hypothyroid Rheumatoid arthritis Struct of sleep apnea Morbid obesity BMI 36.58 AKI - Cr trending down 1.60, good UOP Malnutrition - prealbumin<5, on TPN Klebsiella UTI VDRF, per CCM, weaning today well per RN Thrombocytopenia - plts 147K today  Septic shock Ischemic colitis, acute cholecystitis POD 9,S/pExploratory laparotomy, total abdominal colectomy, [name], cholecystectomy-6/10-Dr. Dalbert Batman - CTAP 6/16 w/out intra-abdominal cause of fever and elevated WBC. There is noted RLL PNA. Currently on Meropnem per CCM - cont VAC changes - having good bowel function currently.  - Speech to perform swallow study per CCM recs - cont JP  - Cont TPN.  - ok to d/c NG from my standpoint  ID -Currently Meropnem, WBC 9,400 FEN -IVF, TPN, NPO VTE -SCDs Foley -in place Follow up -Dr. Adline Peals - son Shanon Brow [phone], husband Hoy Morn [phone]   LOS: 10 days    Rosario Adie, MD  [facility]

## 2018-08-01 NOTE — Progress Notes (Signed)
Physical Therapy Treatment Patient Details Name: Hailey Hughes MRN: [id number] DOB: 28-Nov-1940 Today's Date: 08/01/2018    History of Present Illness 78 year old white female admitted the hospital 6/9 with CT scan evidence of colitis transferred to the ICU  for septic shock, s/p colectomy and cholecystectomy 07/23/18    PT Comments    Performed bed level BLE strengthening exercises. Pt somewhat lethargic but did put forth good effort. Pt refused to attempt supine to sit 2* fatigue. Recommended use of maxisky to get pt OOB during discussion with pt's RN.    Follow Up Recommendations  SNF     Equipment Recommendations  Other (comment)(TBD at next venue)    Recommendations for Other Services       Precautions / Restrictions Precautions Precautions: Fall Precaution Comments: multiple lines, JP drain on L, A Line and PICC RUE, abdominal wound VAC Restrictions Weight Bearing Restrictions: No       Balance                                            Cognition Arousal/Alertness: Awake/alert Behavior During Therapy: WFL for tasks assessed/performed Overall Cognitive Status: Within Functional Limits for tasks assessed                                        Exercises General Exercises - Lower Extremity Ankle Circles/Pumps: AROM;Both;15 reps;Supine Quad Sets: AROM;Both;5 reps;Supine Short Arc Quad: AROM;Both;10 reps;Supine Heel Slides: AAROM;Both;10 reps;Supine Hip ABduction/ADduction: AAROM;Both;10 reps;Supine    General Comments        Pertinent Vitals/Pain Faces Pain Scale: Hurts little more Pain Location: abdomen Pain Descriptors / Indicators: Sore Pain Intervention(s): Limited activity within patient's tolerance;Monitored during session;Premedicated before session    Home Living                      Prior Function            PT Goals (current goals can now be found in the care plan section) Acute Rehab PT  Goals Patient Stated Goal: to get stronger PT Goal Formulation: With patient Time For Goal Achievement: 08/15/18 Potential to Achieve Goals: Fair Progress towards PT goals: Progressing toward goals    Frequency    Min 2X/week      PT Plan Current plan remains appropriate    Co-evaluation              AM-PAC PT "6 Clicks" Mobility   Outcome Measure  Help needed turning from your back to your side while in a flat bed without using bedrails?: Total Help needed moving from lying on your back to sitting on the side of a flat bed without using bedrails?: Total Help needed moving to and from a bed to a chair (including a wheelchair)?: Total Help needed standing up from a chair using your arms (e.g., wheelchair or bedside chair)?: Total Help needed to walk in hospital room?: Total Help needed climbing 3-5 steps with a railing? : Total 6 Click Score: 6    End of Session Equipment Utilized During Treatment: Oxygen Activity Tolerance: Patient limited by fatigue(continuous diarrhea) Patient left: in bed;with call bell/phone within reach Nurse Communication: Mobility status; lift equipment recommended PT Visit Diagnosis: Unsteadiness on feet (R26.81);Difficulty in walking, not elsewhere classified (R26.2);Muscle weakness (generalized) (  M62.81);Pain     Time: 1245-8099 PT Time Calculation (min) (ACUTE ONLY): 12 min  Charges:  $Therapeutic Exercise: 8-22 mins                      Blondell Reveal Kistler PT 08/01/2018  Acute Rehabilitation Services Pager [phone] Office [phone]

## 2018-08-01 NOTE — Consult Note (Signed)
[place] Nurse wound follow up Wound type: Surgical Measurement:See Wednesday Wound bed: red at base, yellow subcutaneous adipose tissue closer to skin level Drainage (amount, consistency, odor) Small amount serous Periwound:Intact. Mild erythema at umbilicus Dressing procedure/placement/frequency: Two pieces of black foam removed and wound cleansed. Two pieces of black foam used to obliterate dead space and foam is tucked into the undermined area at 12 o'clock. Periwound skin protected with drape. Foam covered with drape and attached to 15mmHg continuous negative pressure. An immediate seal is achieved.   [place] Nurse ostomy follow up Stoma type/location: RUQ ileostomy Stomal assessment/size: 1 and 3/8 inch oval with os at center, red, moist Peristomal assessment: mild peristoma erythema, skin intact. Fungal appearance Treatment options for stomal/peristomal skin: skin barrier ring. If you agree, please order a few doses of systemic antifungal (eg. Diflucan) Output: green/brown effluent Ostomy pouching: 1pc.convex with skin barrier ring  Education provided: none today. Patient remains in ICU Enrolled patient in [place] Start Discharge program: No  WOC nursing team will follow, and will remain available to this patient, the nursing, surgical and medical teams. We will see M/W/F for NPWT dressing changes and ostomy pouch changes. Supplies in room.  Thanks, Maudie Flakes, MSN, RN, [place], Arther Abbott  Pager# [phone]

## 2018-08-01 NOTE — Progress Notes (Signed)
PHARMACY - ADULT TOTAL PARENTERAL NUTRITION CONSULT NOTE   Pharmacy Consult for TPN Indication: s/p massive bowel resection  Patient Measurements: Height: 5\' 2"  (157.5 cm) Weight: 236 lb 5.3 oz (107.2 kg) IBW/kg (Calculated) : 50.1 TPN AdjBW (KG): 60.3 Body mass index is 43.23 kg/m.  Insulin Requirements: 12 units moderate scale SSI last 24hr  Current Nutrition: NPO  IVF: none  Central access: PICC TPN start date: 6/11  ASSESSMENT                                                                                                          HPI: 78 yo female s/p total abdominal colectomy, brooke ileostomy and cholecystecomy on 6/10 to start TPN.   Significant events:   Today, 08/01/18  Glucose (goal CBGs < 150): 133-165; CBGs mostly at goal since new TPN bag hung yesterday with 40 units insulin.  Moderate SSI ordered.  Note hx DM on metformin PTA.  Electrolytes - Na high at 148, K low at 3.8 (goal >4), Cl continues to increase at 116, CorrCa slightly elevated at 10.6.  Phos, Mag at goal.     Renal - SCr 1.67, stable.  Good UOP. Also 1425 ml  NG/drain output yesterday.   LFTs - AST/ALT WNL (6/18)  TGs - 225 (6/12), 176 (6/15)  Prealbumin - 5 (6/12), <5 (6/15)  NUTRITIONAL GOALS                                                                                             RD recs: 2100-2300 kcal, 100-115 grams protein.   *recommendations updated 6/17 to increase Kcal goals now that patient off ventilator   Custom TPN at a new goal rate of 18ml/hr to provide  115 g/day protein and 2081 kcal/day.  PLAN                                                                                                                         At 1800 today:  Continue custom TPN- see above for new goals. Continue TPN at goal rate 80 ml/hr - protein 60g/L, dextrose 16%, and lipid 30g/L  TPN will have the following concentrations of electrolytes:  increase phos to 8, increase K to 40, decrease Ca to 8,  decrease Na to 20, Magnesium at 5, Max Acetate  TPN to contain standard multivitamins daily and trace elements MWF only due to national shortage  IVF per MD  Continue SSI/CBGs q4h, insulin in TPN to 40 units.  TPN lab panels on Mondays & Thursdays. BMET, Mag, Phos in am.   Junita Push, PharmD, BCPS 08/01/2018 8:12 AM

## 2018-08-02 ENCOUNTER — Inpatient Hospital Stay (HOSPITAL_COMMUNITY): Payer: Medicare Other

## 2018-08-02 LAB — BASIC METABOLIC PANEL
Anion gap: 9 (ref 5–15)
BUN: 92 mg/dL — ABNORMAL HIGH (ref 8–23)
CO2: 24 mmol/L (ref 22–32)
Calcium: 8.5 mg/dL — ABNORMAL LOW (ref 8.9–10.3)
Chloride: 115 mmol/L — ABNORMAL HIGH (ref 98–111)
Creatinine, Ser: 1.42 mg/dL — ABNORMAL HIGH (ref 0.44–1.00)
GFR calc Af Amer: 40 mL/min — ABNORMAL LOW (ref >60)
GFR calc non Af Amer: 35 mL/min — ABNORMAL LOW (ref >60)
Glucose, Bld: 198 mg/dL — ABNORMAL HIGH (ref 70–99)
Potassium: 3.6 mmol/L (ref 3.5–5.1)
Sodium: 148 mmol/L — ABNORMAL HIGH (ref 135–145)

## 2018-08-02 LAB — GLUCOSE, CAPILLARY
Glucose-Capillary: 127 mg/dL — ABNORMAL HIGH (ref 70–99)
Glucose-Capillary: 155 mg/dL — ABNORMAL HIGH (ref 70–99)
Glucose-Capillary: 162 mg/dL — ABNORMAL HIGH (ref 70–99)
Glucose-Capillary: 163 mg/dL — ABNORMAL HIGH (ref 70–99)
Glucose-Capillary: 164 mg/dL — ABNORMAL HIGH (ref 70–99)
Glucose-Capillary: 190 mg/dL — ABNORMAL HIGH (ref 70–99)

## 2018-08-02 LAB — CBC
HCT: 28.1 % — ABNORMAL LOW (ref 36.0–46.0)
Hemoglobin: 8.7 g/dL — ABNORMAL LOW (ref 12.0–15.0)
MCH: 29.2 pg (ref 26.0–34.0)
MCHC: 31 g/dL (ref 30.0–36.0)
MCV: 94.3 fL (ref 80.0–100.0)
Platelets: 188 10*3/uL (ref 150–400)
RBC: 2.98 MIL/uL — ABNORMAL LOW (ref 3.87–5.11)
RDW: 15.9 % — ABNORMAL HIGH (ref 11.5–15.5)
WBC: 6.9 10*3/uL (ref 4.0–10.5)
nRBC: 0 % (ref 0.0–0.2)

## 2018-08-02 LAB — PHOSPHORUS: Phosphorus: 4.1 mg/dL (ref 2.5–4.6)

## 2018-08-02 LAB — MAGNESIUM: Magnesium: 2.2 mg/dL (ref 1.7–2.4)

## 2018-08-02 MED ORDER — POTASSIUM CHLORIDE 10 MEQ/50ML IV SOLN
10.0000 meq | INTRAVENOUS | Status: DC
  Administered 2018-08-02 (×3): 10 meq via INTRAVENOUS
  Filled 2018-08-02 (×4): qty 50

## 2018-08-02 MED ORDER — TRAVASOL 10 % IV SOLN
INTRAVENOUS | Status: AC
  Administered 2018-08-02: 18:00:00 via INTRAVENOUS
  Filled 2018-08-02: qty 1152

## 2018-08-02 MED ORDER — POTASSIUM CHLORIDE 10 MEQ/50ML IV SOLN
10.0000 meq | INTRAVENOUS | Status: AC
  Administered 2018-08-02: 10 meq via INTRAVENOUS

## 2018-08-02 NOTE — Progress Notes (Signed)
10 Days Post-Op  Subjective: CC: Doing well this morning. No new complaints.   Objective: Vital signs in last 24 hours: Temp:  [96.5 F (35.8 C)-98.4 F (36.9 C)] 98.4 F (36.9 C) (06/20 0424) Pulse Rate:  [80-92] 90 (06/20 0700) Resp:  [15-32] 25 (06/20 0700) BP: (90-167)/(22-114) 155/43 (06/20 0700) SpO2:  [93 %-98 %] 97 % (06/20 0700) Arterial Line BP: (7-162)/(2-75) 7/2 (06/19 1600) Last BM Date: 08/02/18  Intake/Output from previous day: 06/19 0701 - 06/20 0700 In: 1712.7 [I.V.:1514.8; IV Piggyback:197.8] Out: 2041 [Urine:1700; Emesis/NG output:100; Drains:190; Stool:51] Intake/Output this shift: No intake/output data recorded.  PE: Gen: weak appearing with mumbled/slurred speech but is able to answer questions appropratley Abd: soft, min tenderness.  ileostomy with scant bilious outupt recorded/24 hours,  JP drain stripped. SS fluid with deposits noted.  Midline VAC in place with minimal output. See picture below from Thu wound vac change.   Msk: 1+ pitting edema lower extremities b/l.      Lab Results:  Recent Labs    08/01/18 0457 08/02/18 0600  WBC 9.1 6.9  HGB 8.9* 8.7*  HCT 28.4* 28.1*  PLT 198 188   BMET Recent Labs    08/01/18 0457 08/02/18 0600  NA 148* 148*  K 3.8 3.6  CL 116* 115*  CO2 23 24  GLUCOSE 165* 198*  BUN 101* 92*  CREATININE 1.67* 1.42*  CALCIUM 8.5* 8.5*   PT/INR No results for input(s): LABPROT, INR in the last 72 hours. CMP     Component Value Date/Time   NA 148 (H) 08/02/2018 0600   K 3.6 08/02/2018 0600   CL 115 (H) 08/02/2018 0600   CO2 24 08/02/2018 0600   GLUCOSE 198 (H) 08/02/2018 0600   BUN 92 (H) 08/02/2018 0600   CREATININE 1.42 (H) 08/02/2018 0600   CALCIUM 8.5 (L) 08/02/2018 0600   PROT 4.1 (L) 07/31/2018 0500   ALBUMIN 1.3 (L) 07/31/2018 0500   AST 23 07/31/2018 0500   ALT 13 07/31/2018 0500   ALKPHOS 51 07/31/2018 0500   BILITOT 0.2 (L) 07/31/2018 0500   GFRNONAA 35 (L) 08/02/2018 0600   GFRAA 40 (L) 08/02/2018 0600   Lipase     Component Value Date/Time   LIPASE 12 07/25/2018 0500       Studies/Results: Dg Chest Port 1 View  Result Date: 08/02/2018 CLINICAL DATA:  Respiratory failure EXAM: PORTABLE CHEST 1 VIEW COMPARISON:  07/29/2018 chest radiograph. FINDINGS: Interval removal of enteric tube. Right PICC terminates over the right atrium. Stable cardiomediastinal silhouette with normal heart size. No pneumothorax. No pleural effusion. Hazy parahilar and bibasilar lung opacities are not substantially changed. IMPRESSION: 1. Right PICC terminates over the right atrium. 2. No substantial change in hazy parahilar and bibasilar lung opacities. Electronically Signed   By: Delbert Phenix [name]   On: 08/02/2018 07:14    Anti-infectives: Anti-infectives (From admission, onward)   Start     Dose/Rate Route Frequency Ordered Stop   07/29/18 0600  meropenem (MERREM) 1 g in sodium chloride 0.9 % 100 mL IVPB     1 g 200 mL/hr over 30 Minutes Intravenous Every 12 hours 07/29/18 0457     07/29/18 0500  ceFEPIme (MAXIPIME) 2 g in sodium chloride 0.9 % 100 mL IVPB  Status:  Discontinued     2 g 200 mL/hr over 30 Minutes Intravenous Every 24 hours 07/29/18 0436 07/29/18 0457   07/26/18 1400  piperacillin-tazobactam (ZOSYN) IVPB 3.375 g  Status:  Discontinued  3.375 g 12.5 mL/hr over 240 Minutes Intravenous Every 6 hours 07/26/18 0906 07/26/18 1339   07/26/18 1400  cefTRIAXone (ROCEPHIN) 1 g in sodium chloride 0.9 % 100 mL IVPB  Status:  Discontinued     1 g 200 mL/hr over 30 Minutes Intravenous Every 24 hours 07/26/18 1339 07/29/18 0436   07/23/18 0200  piperacillin-tazobactam (ZOSYN) IVPB 2.25 g  Status:  Discontinued     2.25 g 100 mL/hr over 30 Minutes Intravenous Every 6 hours 07/22/18 2248 07/26/18 0906   07/22/18 1800  piperacillin-tazobactam (ZOSYN) IVPB 3.375 g  Status:  Discontinued     3.375 g 12.5 mL/hr over 240 Minutes Intravenous Every 8 hours 07/22/18 0953 07/22/18  2248   07/22/18 1000  piperacillin-tazobactam (ZOSYN) IVPB 3.375 g     3.375 g 100 mL/hr over 30 Minutes Intravenous  Once 07/22/18 0953 07/22/18 1239   07/22/18 0745  ciprofloxacin (CIPRO) IVPB 400 mg     400 mg 200 mL/hr over 60 Minutes Intravenous  Once 07/22/18 0739 07/22/18 0921   07/22/18 0745  metroNIDAZOLE (FLAGYL) IVPB 500 mg     500 mg 100 mL/hr over 60 Minutes Intravenous  Once 07/22/18 0739 07/22/18 1024       Assessment/Plan Type 2 diabetes Hypothyroid Rheumatoid arthritis Struct of sleep apnea Morbid obesity BMI 36.58 AKI - Cr trending down 1.60, good UOP Malnutrition - prealbumin<5, on TPN Klebsiella UTI VDRF, per CCM, weaning today well per RN Thrombocytopenia - plts stable  Septic shock Ischemic colitis, acute cholecystitis POD 10,S/pExploratory laparotomy, total abdominal colectomy, [name], cholecystectomy-6/10-Dr. Dalbert Batman - CTAP 6/16 w/out intra-abdominal cause of fever and elevated WBC. There is noted RLL PNA. Currently on Meropnem per CCM - cont VAC changes - having decent bowel function currently.  - Speech recs clears with supervision - cont JP  - Cont TPN.    ID -Currently Meropnem, WBC normalized FEN -IVF, TPN, clears VTE -SCDs Foley -in place Follow up -Dr. Adline Peals - son Shanon Brow [phone], husband Hoy Morn [phone]   LOS: 11 days    Rosario Adie, MD  [facility]

## 2018-08-02 NOTE — Progress Notes (Signed)
PHARMACY - ADULT TOTAL PARENTERAL NUTRITION CONSULT NOTE   Pharmacy Consult for TPN Indication: s/p massive bowel resection  Patient Measurements: Height: 5\' 2"  (157.5 cm) Weight: 236 lb 5.3 oz (107.2 kg) IBW/kg (Calculated) : 50.1 TPN AdjBW (KG): 60.3 Body mass index is 43.23 kg/m.  Insulin Requirements: 13 units moderate scale SSI last 24hr  Current Nutrition: clear liquid diet ordered 6/19  IVF: none  Central access: PICC TPN start date: 6/11  ASSESSMENT                                                                                                          HPI: 78 yo female s/p total abdominal colectomy, brooke ileostomy and cholecystecomy on 6/10 to start TPN.   Significant events:  6/19 clear liquid diet ordered, NGT removed  Today, 08/02/18  Glucose (goal CBGs < 150): 124-198; CBGs mostly above goal with 40 units insulin in TPN.  Moderate SSI ordered.  Note hx DM on metformin PTA.  Electrolytes - Na high at 148, K low at 3.6 (goal >4), Cl elevated at 115, CorrCa slightly elevated at 10.6.  Phos, Mag at goal.     Renal - SCr 1.42, stable.  Good UOP.    LFTs - AST/ALT WNL (6/18)  TGs - 225 (6/12), 176 (6/15)  Prealbumin - 5 (6/12), <5 (6/15)  NUTRITIONAL GOALS                                                                                             RD recs: 2100-2300 kcal, 100-115 grams protein.   *recommendations updated 6/17 to increase Kcal goals now that patient off ventilator   Custom TPN at a new goal rate of 82ml/hr to provide  115 g/day protein and 2081 kcal/day.  PLAN                                                                                                                         Now:  IV 2082 Krun x4  At 1800 today:  Continue custom TPN- see above for new goals. Continue TPN at goal rate 80 ml/hr - protein 60g/L, dextrose 16%, and lipid 30g/L  TPN will  have the following concentrations of electrolytes: phos at 8, increase K to 45,  decrease Ca to 5, No Na, Magnesium at 5, Max Acetate  TPN to contain standard multivitamins daily and trace elements MWF only due to national shortage  IVF per MD  Continue SSI/CBGs q4h, increase insulin in TPN to 50 units.  TPN lab panels on Mondays & Thursdays. BMET in am.   Netta Cedars, PharmD, BCPS 08/02/2018 8:22 AM

## 2018-08-02 NOTE — Progress Notes (Signed)
NAME:  Hailey Hughes, MRN:  [id number], DOB:  03-04-1940, LOS: 11 ADMISSION DATE:  07/22/2018, CONSULTATION DATE: 07/22/2018 REFERRING MD: Raphael Gibney MD, CHIEF COMPLAINT: Colitis  Brief History   Patient is an 78 year old white female admitted the hospital 6/9 with CT scan evidence of colitis transferred to the ICU  for septic shock Underwent total colectomy for ischemic colitis  Past Medical History  Prior multiple abdominal surgeries, hypothyroidism, type 2 diabetes rheumatoid arthritis, obstructive sleep apnea  Significant Hospital Events   Admission 07/22/2018 transferred to ICU for hypotension 6/10: Received multiple boluses of IV crystalloid remained hypotensive requiring phenylephrine infusion.  Started on vasopressin on top of phenylephrine, central line placed.  Patient did have episode of vomiting.  Developed worsening work of breathing, desaturation, and worsening metabolic acidosis Ultimately required intubation.  Lactic acid initially slightly improved from peak of 5.4 down to 3.5. having exquisite abdominal pain, surgical services consulted->went for emergent exploratory lap, found to have ischemic colitis and possibly ischemic GB she underwent total colectomy w/ ileostomy placement as well as cholecystectomy. She returned to the ICU w/ three vasoactive gtts and bicarb gtt doubled. PCCM asked to resume care.  6/11: Acid-base improving, vasoactive drip requirements decreased, chest x-ray worse with worsening airspace disease and right effusion, decreasing IV fluid rate PEEP adjusted up to facilitate weaning FiO2 starting TNA 6/12.  Vasopressin off.  Weaning norepinephrine to off.  Starting diuresis.  Continuing antibiotics, TNA, and supportive care.  Discontinuing bicarbonate infusion.  Overall improving working towards pressure support ventilation 6/13 still requiring PEEP of 8, 50% 6/14 Concern for aspiration of bilious secretions overnight with increasing hypoxia to 70% FiO2,  remains on PEEP of 8 6/15 Extubated  6/16  developed fever and hypotension,  pressors gradually increased through the night to Levophed at 40 mics, Developed altered mental status head CT was negative >> attributed to Dilaudid  Consults:  General surgery consulted 6/10  Procedures:  ETT 6/10 >>6/15  CVL left subclavian 6/10 >>6/16 RUE PICC 6/16 >> Arterial line 6/10 >> out RT radial a line 6/16 >> 6/19  Significant Diagnostic Tests:  Head CT 6/ 15 >> neg 6/16 CT abd/ pelvis >> Dense consolidation RIGHT lower lobe  No evidence of bowel obstruction. Edematous small bowel in the LEFT abdomen. No pneumatosis.  Mild intraabdominal free fluid. One pocket of fluid in the RIGHT upper quadrant with minimal organization measures 5 cm.  Micro Data:  MRSA screen 6/9: neg BCX2 6/9>>>ng COVID-19 6/9: neg Urine culture 6/9: Klebsiella pneumoniae and sensitive to Rocephin Blood 6/16 >>ng  Antimicrobials:  Zosyn 6/9-6/13 Rocephin 6/13 >>6/16 Meropenem 6/16 >>  Interim history/subjective:  Denies any pain or discomfort Breathing feels steady  Objective   Blood pressure (!) 155/43, pulse 90, temperature 98.4 F (36.9 C), temperature source Axillary, resp. rate (!) 25, height 5\' 2"  (1.575 m), weight 107.2 kg, SpO2 97 %.        Intake/Output Summary (Last 24 hours) at 08/02/2018 1225 Last data filed at 08/02/2018 0300 Gross per 24 hour  Intake 1614.84 ml  Output 1991 ml  Net -376.16 ml   Filed Weights   07/24/18 1132 07/30/18 0910 07/31/18 1016  Weight: 108.5 kg 112.3 kg 107.2 kg   Examination: Elderly lady, does appear comfortable Pupils reactive Moist oral mucosa S1-S2 appreciated Rhonchi bilaterally 2+ peripheral edema   Resolved Hospital Problem list   Hyperkalemia Elevated Lipase (normalized 6/12) Non-gap and non-anion gap metabolic acidosis Lactic acidosis Septic shock AKI  Assessment & Plan:  Acute hypoxemic respiratory failure from aspiration pneumonia and  pulmonary edema -Continue oxygen supplementation  Sepsis from ischemic colitis and bacterial translocation, acute cholecystitis, aspiration pneumonia Klebsiella UTI -Continue antibiotics  Status post laparotomy, total colectomy, [name], cholecystectomy Surgery following  Moderate protein calorie malnutrition Continue TPN  Diabetes SSI  Hypothyroidism Continue Synthroid  Deconditioning PT OT   Best practice:  Diet: NPO, continue TPN DVT prophylaxis: SCDs GI prophylaxis: Protonix Glucose control: Sliding scale insulin Mobility: bed Code Status: full  Labs:   CMP Latest Ref Rng & Units 08/02/2018 08/01/2018 07/31/2018  Glucose 70 - 99 mg/dL 198(H) 165(H) 363(H)  BUN 8 - 23 mg/dL 92(H) 101(H) 90(H)  Creatinine 0.44 - 1.00 mg/dL 1.42(H) 1.67(H) 1.60(H)  Sodium 135 - 145 mmol/L 148(H) 148(H) 142  Potassium 3.5 - 5.1 mmol/L 3.6 3.8 5.0  Chloride 98 - 111 mmol/L 115(H) 116(H) 113(H)  CO2 22 - 32 mmol/L 24 23 21(L)  Calcium 8.9 - 10.3 mg/dL 8.5(L) 8.5(L) 7.8(L)  Total Protein 6.5 - 8.1 g/dL - - 4.1(L)  Total Bilirubin 0.3 - 1.2 mg/dL - - 0.2(L)  Alkaline Phos 38 - 126 U/L - - 51  AST 15 - 41 U/L - - 23  ALT 0 - 44 U/L - - 13   CBC Latest Ref Rng & Units 08/02/2018 08/01/2018 07/31/2018  WBC 4.0 - 10.5 K/uL 6.9 9.1 9.4  Hemoglobin 12.0 - 15.0 g/dL 8.7(L) 8.9(L) 7.4(L)  Hematocrit 36.0 - 46.0 % 28.1(L) 28.4(L) 23.9(L)  Platelets 150 - 400 K/uL 188 198 147(L)   ABG    Component Value Date/Time   PHART 7.360 07/29/2018 1139   PCO2ART 46.8 07/29/2018 1139   PO2ART 69.5 (L) 07/29/2018 1139   HCO3 25.8 07/29/2018 1139   TCO2 17 (L) 07/23/2018 1217   ACIDBASEDEF 1.6 07/24/2018 0302   O2SAT 93.0 07/29/2018 1139   CBG (last 3)  Recent Labs    08/01/18 2343 08/02/18 0412 08/02/18 0812  GLUCAP 169* 162* 163*    Sherrilyn Rist, MD [place], PCCM Cell: [phone]

## 2018-08-03 LAB — GLUCOSE, CAPILLARY
Glucose-Capillary: 129 mg/dL — ABNORMAL HIGH (ref 70–99)
Glucose-Capillary: 104 mg/dL — ABNORMAL HIGH (ref 70–99)
Glucose-Capillary: 122 mg/dL — ABNORMAL HIGH (ref 70–99)
Glucose-Capillary: 115 mg/dL — ABNORMAL HIGH (ref 70–99)
Glucose-Capillary: 145 mg/dL — ABNORMAL HIGH (ref 70–99)

## 2018-08-03 LAB — CULTURE, BLOOD (ROUTINE X 2)
Culture: NO GROWTH
Culture: NO GROWTH
Special Requests: ADEQUATE
Special Requests: ADEQUATE

## 2018-08-03 LAB — BASIC METABOLIC PANEL
Anion gap: 6 (ref 5–15)
BUN: 89 mg/dL — ABNORMAL HIGH (ref 8–23)
CO2: 24 mmol/L (ref 22–32)
Calcium: 8.6 mg/dL — ABNORMAL LOW (ref 8.9–10.3)
Chloride: 115 mmol/L — ABNORMAL HIGH (ref 98–111)
Creatinine, Ser: 1.34 mg/dL — ABNORMAL HIGH (ref 0.44–1.00)
GFR calc Af Amer: 43 mL/min — ABNORMAL LOW (ref >60)
GFR calc non Af Amer: 37 mL/min — ABNORMAL LOW (ref >60)
Glucose, Bld: 119 mg/dL — ABNORMAL HIGH (ref 70–99)
Potassium: 4.4 mmol/L (ref 3.5–5.1)
Sodium: 145 mmol/L (ref 135–145)

## 2018-08-03 MED ORDER — ACETAMINOPHEN 500 MG PO TABS
1000.0000 mg | ORAL_TABLET | Freq: Four times a day (QID) | ORAL | Status: DC
  Administered 2018-08-03 – 2018-08-12 (×22): 1000 mg via ORAL
  Filled 2018-08-03 (×27): qty 2

## 2018-08-03 MED ORDER — TRAVASOL 10 % IV SOLN
INTRAVENOUS | Status: DC
  Administered 2018-08-03: 18:00:00 via INTRAVENOUS
  Filled 2018-08-03: qty 1152

## 2018-08-03 MED ORDER — NYSTATIN 100000 UNIT/GM EX POWD
CUTANEOUS | Status: DC
  Administered 2018-08-03 – 2018-08-17 (×6): via TOPICAL
  Filled 2018-08-03: qty 15

## 2018-08-03 NOTE — Progress Notes (Signed)
11 Days Post-Op  Subjective: CC: Doing well this morning. No new complaints.   Objective: Vital signs in last 24 hours: Temp:  [97.6 F (36.4 C)-98.4 F (36.9 C)] 97.8 F (36.6 C) (06/21 0355) Pulse Rate:  [73-93] 85 (06/21 0700) Resp:  [10-34] 21 (06/21 0700) BP: (98-161)/(31-87) 142/36 (06/21 0700) SpO2:  [91 %-98 %] 94 % (06/21 0700) Last BM Date: 08/02/18  Intake/Output from previous day: 06/20 0701 - 06/21 0700 In: 2920.5 [P.O.:390; I.V.:2199.5; IV Piggyback:331] Out: 2410 [Urine:1210; Drains:65; Stool:1135] Intake/Output this shift: No intake/output data recorded.  PE: Gen: weak appearing, able to answer questions appropratley Abd: soft, min tenderness.  ileostomy with bilious outupt recorded/24 hours,  JP drain purulent.  Midline VAC in place with minimal output.    Msk: 1+ pitting edema lower extremities b/l.      Lab Results:  Recent Labs    08/01/18 0457 08/02/18 0600  WBC 9.1 6.9  HGB 8.9* 8.7*  HCT 28.4* 28.1*  PLT 198 188   BMET Recent Labs    08/02/18 0600 08/03/18 0500  NA 148* 145  K 3.6 4.4  CL 115* 115*  CO2 24 24  GLUCOSE 198* 119*  BUN 92* 89*  CREATININE 1.42* 1.34*  CALCIUM 8.5* 8.6*   PT/INR No results for input(s): LABPROT, INR in the last 72 hours. CMP     Component Value Date/Time   NA 145 08/03/2018 0500   K 4.4 08/03/2018 0500   CL 115 (H) 08/03/2018 0500   CO2 24 08/03/2018 0500   GLUCOSE 119 (H) 08/03/2018 0500   BUN 89 (H) 08/03/2018 0500   CREATININE 1.34 (H) 08/03/2018 0500   CALCIUM 8.6 (L) 08/03/2018 0500   PROT 4.1 (L) 07/31/2018 0500   ALBUMIN 1.3 (L) 07/31/2018 0500   AST 23 07/31/2018 0500   ALT 13 07/31/2018 0500   ALKPHOS 51 07/31/2018 0500   BILITOT 0.2 (L) 07/31/2018 0500   GFRNONAA 37 (L) 08/03/2018 0500   GFRAA 43 (L) 08/03/2018 0500   Lipase     Component Value Date/Time   LIPASE 12 07/25/2018 0500       Studies/Results: Dg Chest Port 1 View  Result Date: 08/02/2018  CLINICAL DATA:  Respiratory failure EXAM: PORTABLE CHEST 1 VIEW COMPARISON:  07/29/2018 chest radiograph. FINDINGS: Interval removal of enteric tube. Right PICC terminates over the right atrium. Stable cardiomediastinal silhouette with normal heart size. No pneumothorax. No pleural effusion. Hazy parahilar and bibasilar lung opacities are not substantially changed. IMPRESSION: 1. Right PICC terminates over the right atrium. 2. No substantial change in hazy parahilar and bibasilar lung opacities. Electronically Signed   By: Delbert Phenix [name]   On: 08/02/2018 07:14    Anti-infectives: Anti-infectives (From admission, onward)   Start     Dose/Rate Route Frequency Ordered Stop   07/29/18 0600  meropenem (MERREM) 1 g in sodium chloride 0.9 % 100 mL IVPB     1 g 200 mL/hr over 30 Minutes Intravenous Every 12 hours 07/29/18 0457     07/29/18 0500  ceFEPIme (MAXIPIME) 2 g in sodium chloride 0.9 % 100 mL IVPB  Status:  Discontinued     2 g 200 mL/hr over 30 Minutes Intravenous Every 24 hours 07/29/18 0436 07/29/18 0457   07/26/18 1400  piperacillin-tazobactam (ZOSYN) IVPB 3.375 g  Status:  Discontinued     3.375 g 12.5 mL/hr over 240 Minutes Intravenous Every 6 hours 07/26/18 0906 07/26/18 1339   07/26/18 1400  cefTRIAXone (ROCEPHIN) 1  g in sodium chloride 0.9 % 100 mL IVPB  Status:  Discontinued     1 g 200 mL/hr over 30 Minutes Intravenous Every 24 hours 07/26/18 1339 07/29/18 0436   07/23/18 0200  piperacillin-tazobactam (ZOSYN) IVPB 2.25 g  Status:  Discontinued     2.25 g 100 mL/hr over 30 Minutes Intravenous Every 6 hours 07/22/18 2248 07/26/18 0906   07/22/18 1800  piperacillin-tazobactam (ZOSYN) IVPB 3.375 g  Status:  Discontinued     3.375 g 12.5 mL/hr over 240 Minutes Intravenous Every 8 hours 07/22/18 0953 07/22/18 2248   07/22/18 1000  piperacillin-tazobactam (ZOSYN) IVPB 3.375 g     3.375 g 100 mL/hr over 30 Minutes Intravenous  Once 07/22/18 0953 07/22/18 1239   07/22/18 0745   ciprofloxacin (CIPRO) IVPB 400 mg     400 mg 200 mL/hr over 60 Minutes Intravenous  Once 07/22/18 0739 07/22/18 0921   07/22/18 0745  metroNIDAZOLE (FLAGYL) IVPB 500 mg     500 mg 100 mL/hr over 60 Minutes Intravenous  Once 07/22/18 0739 07/22/18 1024       Assessment/Plan Type 2 diabetes Hypothyroid Rheumatoid arthritis Struct of sleep apnea Morbid obesity BMI 36.58 AKI - Cr trending down 1.60, good UOP Malnutrition - prealbumin<5, on TPN Klebsiella UTI VDRF, per CCM, weaning today well per RN Thrombocytopenia - plts stable  Septic shock Ischemic colitis, acute cholecystitis POD 11,S/pExploratory laparotomy, total abdominal colectomy, [name], cholecystectomy-6/10-Dr. Dalbert Batman - CTAP 6/16 w/out intra-abdominal cause of fever and elevated WBC. There is noted RLL PNA. Currently on Meropnem per CCM - cont VAC changes - having decent bowel function currently.  - advance to full liquids today   ID -Currently Meropnem, WBC normalized FEN -IVF, TPN, clears VTE -SCDs Foley -in place Follow up -Dr. Adline Peals - son Shanon Brow [phone], husband Hoy Morn [phone]   LOS: 12 days    Rosario Adie, MD  Colorectal and [facility] [facility]

## 2018-08-03 NOTE — Progress Notes (Signed)
NAME:  Hailey Hughes, MRN:  [id number], DOB:  Feb 20, 1940, LOS: 52 ADMISSION DATE:  07/22/2018, CONSULTATION DATE: 07/22/2018 REFERRING MD: Francia Greaves MD, CHIEF COMPLAINT: Colitis  Brief History   Patient is an 78 year old white female admitted the hospital 6/9 with CT scan evidence of colitis transferred to the ICU  for septic shock Underwent total colectomy for ischemic colitis  Past Medical History  Prior multiple abdominal surgeries, hypothyroidism, type 2 diabetes rheumatoid arthritis, obstructive sleep apnea  Significant Hospital Events   Admission 07/22/2018 transferred to ICU for hypotension 6/10: Received multiple boluses of IV crystalloid remained hypotensive requiring phenylephrine infusion.  Started on vasopressin on top of phenylephrine, central line placed.  Patient did have episode of vomiting.  Developed worsening work of breathing, desaturation, and worsening metabolic acidosis Ultimately required intubation.  Lactic acid initially slightly improved from peak of 5.4 down to 3.5. having exquisite abdominal pain, surgical services consulted->went for emergent exploratory lap, found to have ischemic colitis and possibly ischemic GB she underwent total colectomy w/ ileostomy placement as well as cholecystectomy. She returned to the ICU w/ three vasoactive gtts and bicarb gtt doubled. PCCM asked to resume care.  6/11: Acid-base improving, vasoactive drip requirements decreased, chest x-ray worse with worsening airspace disease and right effusion, decreasing IV fluid rate PEEP adjusted up to facilitate weaning FiO2 starting TNA 6/12.  Vasopressin off.  Weaning norepinephrine to off.  Starting diuresis.  Continuing antibiotics, TNA, and supportive care.  Discontinuing bicarbonate infusion.  Overall improving working towards pressure support ventilation 6/13 still requiring PEEP of 8, 50% 6/14 Concern for aspiration of bilious secretions overnight with increasing hypoxia to 70% FiO2,  remains on PEEP of 8 6/15 Extubated  6/16  developed fever and hypotension,  pressors gradually increased through the night to Levophed at 40 mics, Developed altered mental status head CT was negative >> attributed to Dilaudid  Consults:  General surgery consulted 6/10  Procedures:  ETT 6/10 >>6/15  CVL left subclavian 6/10 >>6/16 RUE PICC 6/16 >> Arterial line 6/10 >> out RT radial a line 6/16 >> 6/19  Significant Diagnostic Tests:  Head CT 6/ 15 >> neg 6/16 CT abd/ pelvis >> Dense consolidation RIGHT lower lobe  No evidence of bowel obstruction. Edematous small bowel in the LEFT abdomen. No pneumatosis.  Mild intraabdominal free fluid. One pocket of fluid in the RIGHT upper quadrant with minimal organization measures 5 cm.  Micro Data:  MRSA screen 6/9: neg BCX2 6/9>>>ng COVID-19 6/9: neg Urine culture 6/9: Klebsiella pneumoniae and sensitive to Rocephin Blood 6/16 >>ng  Antimicrobials:  Zosyn 6/9-6/13 Rocephin 6/13 >>6/16 Meropenem 6/16 >>  Interim history/subjective:  Breathing feels better Weaned off oxygen Still fatigued  Objective   Blood pressure (!) 180/66, pulse 98, temperature 97.8 F (36.6 C), temperature source Axillary, resp. rate (!) 28, height 5\' 2"  (1.575 m), weight 107.2 kg, SpO2 91 %.        Intake/Output Summary (Last 24 hours) at 08/03/2018 1153 Last data filed at 08/03/2018 1100 Gross per 24 hour  Intake 3327.26 ml  Output 2410 ml  Net 917.26 ml   Filed Weights   07/24/18 1132 07/30/18 0910 07/31/18 1016  Weight: 108.5 kg 112.3 kg 107.2 kg   Examination: Elderly lady, does not appear to be in respiratory distress Pupils reactive Moist oral mucosa S1-S2 appreciated Rhonchi bilaterally 2+ peripheral edema No significant neurological findings   Resolved Hospital Problem list   Hyperkalemia Elevated Lipase (normalized 6/12) Non-gap and non-anion gap metabolic acidosis  Lactic acidosis Septic shock AKI  Assessment & Plan:   Acute  hypoxemic respiratory failure from aspiration pneumonia and pulmonary edema Weaned off oxygen   Sepsis from ischemic colitis and bacterial translocation, acute cholecystitis, aspiration pneumonia, Klebsiella UTI Completed antibiotics-meropenem  Status post laparotomy, total colectomy, [name], cholecystectomy Surgery following  Moderate protein calorie malnutrition Continue TPN  Diabetes SSI  Hypothyroidism Continue Synthroid  Deconditioning PT OT   PCCM will sign off Call as needed   Best practice:  Diet: NPO, continue TPN DVT prophylaxis: SCDs GI prophylaxis: Protonix Glucose control: Sliding scale insulin Mobility: bed Code Status: full  Labs:   CMP Latest Ref Rng & Units 08/03/2018 08/02/2018 08/01/2018  Glucose 70 - 99 mg/dL 914(N) 829(F) 621(H)  BUN 8 - 23 mg/dL 08(M) 57(Q) 469(G)  Creatinine 0.44 - 1.00 mg/dL 2.95(M) 8.41(L) 2.44(W)  Sodium 135 - 145 mmol/L 145 148(H) 148(H)  Potassium 3.5 - 5.1 mmol/L 4.4 3.6 3.8  Chloride 98 - 111 mmol/L 115(H) 115(H) 116(H)  CO2 22 - 32 mmol/L 24 24 23   Calcium 8.9 - 10.3 mg/dL ) 1.0(U) 7.2(Z)  Total Protein 6.5 - 8.1 g/dL - - -  Total Bilirubin 0.3 - 1.2 mg/dL - - -  Alkaline Phos 38 - 126 U/L - - -  AST 15 - 41 U/L - - -  ALT 0 - 44 U/L - - -   CBC Latest Ref Rng & Units 08/02/2018 08/01/2018 07/31/2018  WBC 4.0 - 10.5 K/uL 6.9 9.1 9.4  Hemoglobin 12.0 - 15.0 g/dL 08/02/2018) 8.9(L) 7.4(L)  Hematocrit 36.0 - 46.0 % 28.1(L) 28.4(L) 23.9(L)  Platelets 150 - 400 K/uL 188 198 147(L)   ABG    Component Value Date/Time   PHART 7.360 07/29/2018 1139   PCO2ART 46.8 07/29/2018 1139   PO2ART 69.5 (L) 07/29/2018 1139   HCO3 25.8 07/29/2018 1139   TCO2 17 (L) 07/23/2018 1217   ACIDBASEDEF 1.6 07/24/2018 0302   O2SAT 93.0 07/29/2018 1139   CBG (last 3)  Recent Labs    08/02/18 2342 08/03/18 0327 08/03/18 0736  GLUCAP 127* 129* 104*    08/05/18, MD Circle, PCCM Cell: [phone]

## 2018-08-03 NOTE — Progress Notes (Signed)
PHARMACY - ADULT TOTAL PARENTERAL NUTRITION CONSULT NOTE   Pharmacy Consult for TPN Indication: s/p massive bowel resection  Patient Measurements: Height: 5\' 2"  (157.5 cm) Weight: 236 lb 5.3 oz (107.2 kg) IBW/kg (Calculated) : 50.1 TPN AdjBW (KG): 60.3 Body mass index is 43.23 kg/m.  Insulin Requirements: 16 units moderate scale SSI last 24hr  Current Nutrition: advancing to full liquid diet 6/21  IVF: none  Central access: PICC TPN start date: 6/11  ASSESSMENT                                                                                                          HPI: 78 yo female s/p total abdominal colectomy, brooke ileostomy and cholecystecomy on 6/10 to start TPN.   Significant events:  6/19 clear liquid diet ordered, NGT removed 6/21 advancing to full liquid diet  Today, 08/03/18  Glucose (goal CBGs < 150): CBGs at goal (104-127) with increase to 50 units insulin in TPN.  Moderate SSI ordered.  Note hx DM on metformin PTA.  Electrolytes - Na, K at goal after Kruns x4 on 6/20.  except CoCorr remains slightly elevated at 10.76, Cl elevated at  115.  Mag, Phos WNL 6/20.  Renal - SCr 1.34.  Good UOP.    LFTs - AST/ALT WNL (6/18)  TGs - 225 (6/12), 176 (6/15)  Prealbumin - 5 (6/12), <5 (6/15)  NUTRITIONAL GOALS                                                                                             RD recs: 2100-2300 kcal, 100-115 grams protein.   *recommendations updated 6/17 to increase Kcal goals now that patient off ventilator   Custom TPN at a new goal rate of 55ml/hr to provide  115 g/day protein and 2081 kcal/day.  PLAN                                                                                                                         At 1800 today:  Continue custom TPN- see above for new goals. Continue TPN at goal rate 80 ml/hr - protein 60g/L, dextrose 16%, and lipid 30g/L  TPN will  have the following concentrations of electrolytes: phos at 8, K  at 45, remove Ca from TPN completely, No Na, Magnesium at 5, Max Acetate  TPN to contain standard multivitamins daily and trace elements MWF only due to national shortage  IVF per MD  Continue SSI/CBGs q4h, continue 50 units insulin in TPN.  TPN lab panels on Mondays & Thursdays.    Netta Cedars, PharmD, BCPS 08/03/2018 10:31 AM

## 2018-08-04 LAB — COMPREHENSIVE METABOLIC PANEL
ALT: 33 U/L (ref 0–44)
AST: 35 U/L (ref 15–41)
Albumin: 1.7 g/dL — ABNORMAL LOW (ref 3.5–5.0)
Alkaline Phosphatase: 99 U/L (ref 38–126)
Anion gap: 9 (ref 5–15)
BUN: 82 mg/dL — ABNORMAL HIGH (ref 8–23)
CO2: 21 mmol/L — ABNORMAL LOW (ref 22–32)
Calcium: 8.6 mg/dL — ABNORMAL LOW (ref 8.9–10.3)
Chloride: 114 mmol/L — ABNORMAL HIGH (ref 98–111)
Creatinine, Ser: 1.23 mg/dL — ABNORMAL HIGH (ref 0.44–1.00)
GFR calc Af Amer: 48 mL/min — ABNORMAL LOW (ref >60)
GFR calc non Af Amer: 41 mL/min — ABNORMAL LOW (ref >60)
Glucose, Bld: 133 mg/dL — ABNORMAL HIGH (ref 70–99)
Potassium: 4.9 mmol/L (ref 3.5–5.1)
Sodium: 144 mmol/L (ref 135–145)
Total Bilirubin: 0.5 mg/dL (ref 0.3–1.2)
Total Protein: 5.4 g/dL — ABNORMAL LOW (ref 6.5–8.1)

## 2018-08-04 LAB — DIFFERENTIAL
Abs Immature Granulocytes: 0.74 10*3/uL — ABNORMAL HIGH (ref 0.00–0.07)
Basophils Absolute: 0.1 10*3/uL (ref 0.0–0.1)
Basophils Relative: 1 %
Eosinophils Absolute: 0.4 10*3/uL (ref 0.0–0.5)
Eosinophils Relative: 3 %
Immature Granulocytes: 7 %
Lymphocytes Relative: 11 %
Lymphs Abs: 1.1 10*3/uL (ref 0.7–4.0)
Monocytes Absolute: 1.4 10*3/uL — ABNORMAL HIGH (ref 0.1–1.0)
Monocytes Relative: 13 %
Neutro Abs: 7.1 10*3/uL (ref 1.7–7.7)
Neutrophils Relative %: 65 %

## 2018-08-04 LAB — CBC
HCT: 27.3 % — ABNORMAL LOW (ref 36.0–46.0)
Hemoglobin: 8.3 g/dL — ABNORMAL LOW (ref 12.0–15.0)
MCH: 28.6 pg (ref 26.0–34.0)
MCHC: 30.4 g/dL (ref 30.0–36.0)
MCV: 94.1 fL (ref 80.0–100.0)
Platelets: 315 10*3/uL (ref 150–400)
RBC: 2.9 MIL/uL — ABNORMAL LOW (ref 3.87–5.11)
RDW: 15.7 % — ABNORMAL HIGH (ref 11.5–15.5)
WBC: 10.9 10*3/uL — ABNORMAL HIGH (ref 4.0–10.5)
nRBC: 0.2 % (ref 0.0–0.2)

## 2018-08-04 LAB — GLUCOSE, CAPILLARY
Glucose-Capillary: 118 mg/dL — ABNORMAL HIGH (ref 70–99)
Glucose-Capillary: 122 mg/dL — ABNORMAL HIGH (ref 70–99)
Glucose-Capillary: 130 mg/dL — ABNORMAL HIGH (ref 70–99)
Glucose-Capillary: 130 mg/dL — ABNORMAL HIGH (ref 70–99)
Glucose-Capillary: 141 mg/dL — ABNORMAL HIGH (ref 70–99)
Glucose-Capillary: 86 mg/dL (ref 70–99)

## 2018-08-04 LAB — PHOSPHORUS: Phosphorus: 3.5 mg/dL (ref 2.5–4.6)

## 2018-08-04 LAB — PREALBUMIN: Prealbumin: 11.5 mg/dL — ABNORMAL LOW (ref 18–38)

## 2018-08-04 LAB — MAGNESIUM: Magnesium: 2.3 mg/dL (ref 1.7–2.4)

## 2018-08-04 LAB — TRIGLYCERIDES: Triglycerides: 128 mg/dL (ref <150)

## 2018-08-04 MED ORDER — TRAVASOL 10 % IV SOLN: INTRAVENOUS | Status: DC

## 2018-08-04 MED ORDER — INSULIN ASPART 100 UNIT/ML ~~LOC~~ SOLN
0.0000 [IU] | Freq: Four times a day (QID) | SUBCUTANEOUS | Status: DC
  Administered 2018-08-04: 13:00:00 2 [IU] via SUBCUTANEOUS

## 2018-08-04 MED ORDER — PANTOPRAZOLE SODIUM 40 MG PO TBEC
40.0000 mg | DELAYED_RELEASE_TABLET | Freq: Every day | ORAL | Status: DC
  Administered 2018-08-04 – 2018-08-19 (×16): 40 mg via ORAL
  Filled 2018-08-04 (×16): qty 1

## 2018-08-04 MED ORDER — INSULIN ASPART 100 UNIT/ML ~~LOC~~ SOLN: 0.0000 [IU] | Freq: Every day | SUBCUTANEOUS | Status: DC

## 2018-08-04 MED ORDER — INSULIN ASPART 100 UNIT/ML ~~LOC~~ SOLN: 0.0000 [IU] | Freq: Three times a day (TID) | SUBCUTANEOUS | Status: DC

## 2018-08-04 MED ORDER — LEVOTHYROXINE SODIUM 112 MCG PO TABS
112.0000 ug | ORAL_TABLET | Freq: Every day | ORAL | Status: DC
  Administered 2018-08-05 – 2018-08-20 (×16): 112 ug via ORAL
  Filled 2018-08-04 (×17): qty 1

## 2018-08-04 MED ORDER — TRAVASOL 10 % IV SOLN
INTRAVENOUS | Status: DC
  Administered 2018-08-04: 18:00:00 via INTRAVENOUS
  Filled 2018-08-04: qty 576

## 2018-08-04 MED ORDER — CHLORHEXIDINE GLUCONATE CLOTH 2 % EX PADS
6.0000 | MEDICATED_PAD | Freq: Every day | CUTANEOUS | Status: DC
  Administered 2018-08-04 – 2018-08-10 (×6): 6 via TOPICAL

## 2018-08-04 MED ORDER — GLUCERNA SHAKE PO LIQD
237.0000 mL | Freq: Three times a day (TID) | ORAL | Status: DC
  Administered 2018-08-04 – 2018-08-05 (×2): 237 mL via ORAL
  Filled 2018-08-04 (×5): qty 237

## 2018-08-04 MED ORDER — INSULIN ASPART 100 UNIT/ML ~~LOC~~ SOLN
0.0000 [IU] | Freq: Three times a day (TID) | SUBCUTANEOUS | Status: DC
  Administered 2018-08-06 (×2): 2 [IU] via SUBCUTANEOUS

## 2018-08-04 NOTE — Progress Notes (Addendum)
PHARMACY - ADULT TOTAL PARENTERAL NUTRITION CONSULT NOTE   Pharmacy Consult for TPN Indication: s/p massive bowel resection  Patient Measurements: Height: 5\' 2"  (157.5 cm) Weight: 236 lb 5.3 oz (107.2 kg) IBW/kg (Calculated) : 50.1 TPN AdjBW (KG): 60.3 Body mass index is 43.23 kg/m.  Current Nutrition: soft diet starting 6/22, tolerated fulls IVF: none Central access: PICC TPN start date: 6/11  ASSESSMENT                                                                                                          HPI: 78 yo female s/p total abdominal colectomy, brooke ileostomy and cholecystecomy on 6/10 to start TPN.   Significant events:  6/19 clear liquid diet ordered, NGT removed 6/21 advancing to full liquid diet 6/22 advancing to soft diet, per Surgery request cut TPN to 50%  Today, 08/04/18  Glucose (goal CBGs < 150):   CBGs remain at goal (115-141) with 50 units insulin in TPN, moderate SSI  Hx DM on metformin PTA  10 units SSI yesterday  Electrolytes - Cl elevated, K improved to WNL after repletion, others WNL (phos trending down)  Renal - SCr 1.34.  Good UOP.    LFTs - WNL, albumin low  TGs - improved to goal (< 150)  Prealbumin - still low but significantly improved  NUTRITIONAL GOALS                                                                                             RD recs: 2100-2300 kcal, 100-115 grams protein.   *recommendations updated 6/17 to increase Kcal goals now that patient off ventilator   Custom TPN at a new goal rate of 49ml/hr to provide  115 g/day protein and 2081 kcal/day.  PLAN                                                                                                                         At 1800 today:  Wean  custom TPN to 40 ml/hr per surgery while advancing to soft diet  Electrolytes - no change from yesterday, Cl:Ac = max Ac  TPN to contain standard multivitamins daily and trace elements  MWF only due to national  shortage  IVF per MD  Continue SSI/CBGs q4h, continue 50 units insulin in TPN  Will convert Synthroid and PPI to PO  TPN lab panels on Mondays & Thursdays  Bmet, Phos tomorrow  Reuel Boom, PharmD, BCPS [phone] 08/04/2018, 9:57 AM

## 2018-08-04 NOTE — Progress Notes (Signed)
Patient being bathed and moderate sized consistently intermittent drainage noted from rectum. Drainage not same consistency or color as output in ostomy. Drainage tan and purulent in color with same appearance as output of JP drain. Output has slowed from JP drain and thick mucus like substance noted in JP tubing. Attempted to remove mucus from JP still no out put from [place]. PCCM made aware and surgery notified. Plan to reassess in AM with surgical team. Nursing team placed a rectal pouch to cath drainage to prevent skin breakdown as drainage fairly heavy.

## 2018-08-04 NOTE — Progress Notes (Signed)
Patient ID: Hailey Hughes, female   DOB: 07-10-1940, 78 y.o.   MRN: [id number]    12 Days Post-Op  Subjective: Patient is very weak and deconditioned.  Just had VAC changed.  Tolerating full liquids.  Would like to try some solid food like toast.  Ostomy working well.  Hasn't been out of bed in over a week or able to get up to the chair.  Objective: Vital signs in last 24 hours: Temp:  [97.6 F (36.4 C)-98.9 F (37.2 C)] 98.9 F (37.2 C) (06/22 0349) Pulse Rate:  [69-98] 87 (06/22 0600) Resp:  [17-32] 24 (06/22 0600) BP: (108-180)/(29-104) 145/41 (06/22 0600) SpO2:  [91 %-98 %] 98 % (06/22 0600) Last BM Date: 08/04/18  Intake/Output from previous day: 06/21 0701 - 06/22 0700 In: 1924.2 [P.O.:480; I.V.:1377.5; IV Piggyback:66.7] Out: 1360 [Urine:650; Drains:60; Stool:650] Intake/Output this shift: Total I/O In: 10 [Other:10] Out: 46 [Stool:50]  PE: Gen: deconditioned Abd: soft, severely obese, midline wound with pale pink granulation tissue.  No significant necrosis in wound.  VAC replaced.  Some tan drainage in VAC cannister.  JP drain with some slightly brown, dirty dishwater type output.  Ileostomy just changed.  Stoma is pink and viable.  Already a little bit of bilious enteric output.  Lab Results:  Recent Labs    08/02/18 0600 08/04/18 0430  WBC 6.9 10.9*  HGB 8.7* 8.3*  HCT 28.1* 27.3*  PLT 188 315   BMET Recent Labs    08/03/18 0500 08/04/18 0430  NA 145 144  K 4.4 4.9  CL 115* 114*  CO2 24 21*  GLUCOSE 119* 133*  BUN 89* 82*  CREATININE 1.34* 1.23*  CALCIUM 8.6* 8.6*   PT/INR No results for input(s): LABPROT, INR in the last 72 hours. CMP     Component Value Date/Time   NA 144 08/04/2018 0430   K 4.9 08/04/2018 0430   CL 114 (H) 08/04/2018 0430   CO2 21 (L) 08/04/2018 0430   GLUCOSE 133 (H) 08/04/2018 0430   BUN 82 (H) 08/04/2018 0430   CREATININE 1.23 (H) 08/04/2018 0430   CALCIUM 8.6 (L) 08/04/2018 0430   PROT 5.4 (L) 08/04/2018 0430    ALBUMIN 1.7 (L) 08/04/2018 0430   AST 35 08/04/2018 0430   ALT 33 08/04/2018 0430   ALKPHOS 99 08/04/2018 0430   BILITOT 0.5 08/04/2018 0430   GFRNONAA 41 (L) 08/04/2018 0430   GFRAA 48 (L) 08/04/2018 0430   Lipase     Component Value Date/Time   LIPASE 12 07/25/2018 0500       Studies/Results: No results found.  Anti-infectives: Anti-infectives (From admission, onward)   Start     Dose/Rate Route Frequency Ordered Stop   07/29/18 0600  meropenem (MERREM) 1 g in sodium chloride 0.9 % 100 mL IVPB  Status:  Discontinued     1 g 200 mL/hr over 30 Minutes Intravenous Every 12 hours 07/29/18 0457 08/03/18 1048   07/29/18 0500  ceFEPIme (MAXIPIME) 2 g in sodium chloride 0.9 % 100 mL IVPB  Status:  Discontinued     2 g 200 mL/hr over 30 Minutes Intravenous Every 24 hours 07/29/18 0436 07/29/18 0457   07/26/18 1400  piperacillin-tazobactam (ZOSYN) IVPB 3.375 g  Status:  Discontinued     3.375 g 12.5 mL/hr over 240 Minutes Intravenous Every 6 hours 07/26/18 0906 07/26/18 1339   07/26/18 1400  cefTRIAXone (ROCEPHIN) 1 g in sodium chloride 0.9 % 100 mL IVPB  Status:  Discontinued  1 g 200 mL/hr over 30 Minutes Intravenous Every 24 hours 07/26/18 1339 07/29/18 0436   07/23/18 0200  piperacillin-tazobactam (ZOSYN) IVPB 2.25 g  Status:  Discontinued     2.25 g 100 mL/hr over 30 Minutes Intravenous Every 6 hours 07/22/18 2248 07/26/18 0906   07/22/18 1800  piperacillin-tazobactam (ZOSYN) IVPB 3.375 g  Status:  Discontinued     3.375 g 12.5 mL/hr over 240 Minutes Intravenous Every 8 hours 07/22/18 0953 07/22/18 2248   07/22/18 1000  piperacillin-tazobactam (ZOSYN) IVPB 3.375 g     3.375 g 100 mL/hr over 30 Minutes Intravenous  Once 07/22/18 0953 07/22/18 1239   07/22/18 0745  ciprofloxacin (CIPRO) IVPB 400 mg     400 mg 200 mL/hr over 60 Minutes Intravenous  Once 07/22/18 0739 07/22/18 0921   07/22/18 0745  metroNIDAZOLE (FLAGYL) IVPB 500 mg     500 mg 100 mL/hr over 60 Minutes  Intravenous  Once 07/22/18 0739 07/22/18 1024       Assessment/Plan Type 2 diabetes Hypothyroid Rheumatoid arthritis Struct of sleep apnea Morbid obesity BMI 36.58 AKI - Cr trending down 1.60, good UOP Malnutrition - prealbumin<5, on TPN Klebsiella UTI VDRF, per CCM, weaning today well per RN Thrombocytopenia - pltsstable  Septic shock Ischemic colitis, acute cholecystitis POD12,S/pExploratory laparotomy, total abdominal colectomy, [name], cholecystectomy-6/10-Dr. Derrell Lolling - CTAP 6/16 w/out intra-abdominal cause of fever and elevated WBC. There is noted RLL PNA. Currently on Meropnem per CCM - cont VAC changes - having decent bowel function currently. - advance to soft diet -prealbumin 11.5 -cont surgical JP drain at this time due to slightly brown appearing output.   ID -Currently Meropnem, WBC normalized FEN -IVF, TNA, half rate, soft diet, glucerna VTE -SCDs Foley -in place Follow up -Dr. Marnette Burgess - son Onalee Hua [phone], husband Henreitta Cea [phone]   LOS: 13 days    Letha Cape , [facility] 08/04/2018, 8:24 AM Pager: [phone]

## 2018-08-04 NOTE — Progress Notes (Addendum)
Hailey Hughes, MRN:  [id number], DOB:  12/12/1940, LOS: 13 ADMISSION DATE:  07/22/2018, CONSULTATION DATE: 07/22/2018 REFERRING MD: Raphael Gibney MD, CHIEF COMPLAINT: Colitis  Brief History   Patient is an 78 year old white female admitted the hospital 6/9 with CT scan evidence of colitis transferred to the ICU  for septic shock Underwent total colectomy for ischemic colitis  Past Medical History  Prior multiple abdominal surgeries, hypothyroidism, type 2 diabetes rheumatoid arthritis, obstructive sleep apnea  Significant Hospital Events   Admission 07/22/2018 transferred to ICU for hypotension 6/10: Received multiple boluses of IV crystalloid remained hypotensive requiring phenylephrine infusion.  Started on vasopressin on top of phenylephrine, central line placed.  Patient did have episode of vomiting.  Developed worsening work of breathing, desaturation, and worsening metabolic acidosis Ultimately required intubation.  Lactic acid initially slightly improved from peak of 5.4 down to 3.5. having exquisite abdominal pain, surgical services consulted->went for emergent exploratory lap, found to have ischemic colitis and possibly ischemic GB she underwent total colectomy w/ ileostomy placement as well as cholecystectomy. She returned to the ICU w/ three vasoactive gtts and bicarb gtt doubled. PCCM asked to resume care.  6/11: Acid-base improving, vasoactive drip requirements decreased, chest x-ray worse with worsening airspace disease and right effusion, decreasing IV fluid rate PEEP adjusted up to facilitate weaning FiO2 starting TNA 6/12.  Vasopressin off.  Weaning norepinephrine to off.  Starting diuresis.  Continuing antibiotics, TNA, and supportive care.  Discontinuing bicarbonate infusion.  Overall improving working towards pressure support ventilation 6/13 still requiring PEEP of 8, 50% 6/14 Concern for aspiration of bilious secretions overnight with increasing hypoxia to 70% FiO2,  remains on PEEP of 8 6/15 Extubated  6/16  developed fever and hypotension,  pressors gradually increased through the night to Levophed at 40 mics, Developed altered mental status head CT was negative >> attributed to Dilaudid  Consults:  General surgery consulted 6/10  Procedures:  ETT 6/10 >>6/15  CVL left subclavian 6/10 >>6/16 RUE PICC 6/16 >> Arterial line 6/10 >> out RT radial a line 6/16 >> 6/19  Significant Diagnostic Tests:  Head CT 6/ 15 >> neg 6/16 CT abd/ pelvis >> Dense consolidation RIGHT lower lobe  No evidence of bowel obstruction. Edematous small bowel in the LEFT abdomen. No pneumatosis.  Mild intraabdominal free fluid. One pocket of fluid in the RIGHT upper quadrant with minimal organization measures 5 cm.  Micro Data:  MRSA screen 6/9: neg BCX2 6/9>>>ng COVID-19 6/9: neg Urine culture 6/9: Klebsiella pneumoniae and sensitive to Rocephin Blood 6/16 >>ng  Antimicrobials:  Zosyn 6/9-6/13 Rocephin 6/13 >>6/16 Meropenem 6/16 >>  Interim history/subjective:  Breathing feels better Weaned off oxygen Still fatigued  Objective   Blood pressure 103/79, pulse 92, temperature 98.6 F (37 C), temperature source Oral, resp. rate 15, height 5\' 2"  (1.575 m), weight 107.2 kg, SpO2 93 %.        Intake/Output Summary (Last 24 hours) at 08/04/2018 1712 Last data filed at 08/04/2018 1600 Gross per 24 hour  Intake 1956.41 ml  Output 2460 ml  Net -503.59 ml   Filed Weights   07/24/18 1132 07/30/18 0910 07/31/18 1016  Weight: 108.5 kg 112.3 kg 107.2 kg   Examination: Elderly lady, does not appear to be in respiratory distress Pupils equal and reactive Moist oral mucosa S2 appreciated Rhonchi bilaterally 2+ peripheral edema Alert and oriented, moving all extremities  Resolved Hospital Problem list   Hyperkalemia Elevated Lipase (normalized 6/12) Non-gap and non-anion gap metabolic  acidosis Lactic acidosis Septic shock AKI  Assessment & Plan:   Acute  hypoxemic respiratory failure from aspiration pneumonia and pulmonary edema Currently weaned off oxygen  Sepsis from ischemic colitis and bacterial translocation, Completed antibiotic-meropenem  Status post laparotomy, total colectomy, [name], cholecystectomy Surgery following  Moderate protein calorie malnutrition Continue TPN  Diabetes SSI  Hypothyroidism Continue Synthroid  Deconditioning PT OT  We will transfer to [facility] service  Best practice:  Diet: NPO, continue TPN DVT prophylaxis: SCDs GI prophylaxis: Protonix Glucose control: Sliding scale insulin Mobility: bed Code Status: full  Labs:   CMP Latest Ref Rng & Units 08/04/2018 08/03/2018 08/02/2018  Glucose 70 - 99 mg/dL 133(H) 119(H) 198(H)  BUN 8 - 23 mg/dL 82(H) 89(H) 92(H)  Creatinine 0.44 - 1.00 mg/dL 1.23(H) 1.34(H) 1.42(H)  Sodium 135 - 145 mmol/L 144 145 148(H)  Potassium 3.5 - 5.1 mmol/L 4.9 4.4 3.6  Chloride 98 - 111 mmol/L 114(H) 115(H) 115(H)  CO2 22 - 32 mmol/L 21(L) 24 24  Calcium 8.9 - 10.3 mg/dL 8.6(L) 8.6(L) 8.5(L)  Total Protein 6.5 - 8.1 g/dL 5.4(L) - -  Total Bilirubin 0.3 - 1.2 mg/dL 0.5 - -  Alkaline Phos 38 - 126 U/L 99 - -  AST 15 - 41 U/L 35 - -  ALT 0 - 44 U/L 33 - -   CBC Latest Ref Rng & Units 08/04/2018 08/02/2018 08/01/2018  WBC 4.0 - 10.5 K/uL 10.9(H) 6.9 9.1  Hemoglobin 12.0 - 15.0 g/dL 8.3(L) 8.7(L) 8.9(L)  Hematocrit 36.0 - 46.0 % 27.3(L) 28.1(L) 28.4(L)  Platelets 150 - 400 K/uL 315 188 198   ABG    Component Value Date/Time   PHART 7.360 07/29/2018 1139   PCO2ART 46.8 07/29/2018 1139   PO2ART 69.5 (L) 07/29/2018 1139   HCO3 25.8 07/29/2018 1139   TCO2 17 (L) 07/23/2018 1217   ACIDBASEDEF 1.6 07/24/2018 0302   O2SAT 93.0 07/29/2018 1139   CBG (last 3)  Recent Labs    08/04/18 0754 08/04/18 1203 08/04/18 1555  GLUCAP 141* 130* 118*    Hailey Rist, MD [place], PCCM Cell: [phone]  Addendum: Did have questionable blockage of drain,  significant effluent from her rectum Concern for a fistulous connection For CT abdomen in a.m.

## 2018-08-04 NOTE — Progress Notes (Signed)
  Speech Language Pathology Treatment: Dysphagia  Patient Details Name: LEGEND PECORE MRN: [id number] DOB: November 21, 1940 Today's Date: 08/04/2018 Time: 1200-1217 SLP Time Calculation (min) (ACUTE ONLY): 17 min  Assessment / Plan / Recommendation Clinical Impression  Pt seen at bedside for assessment of diet tolerance. Pt was seated upright with lunch on tray in front of her. Nursing reports good appetite and breakfast this morning, with no overt difficulty. Pt accepted minimal boluses of thin liquid and soft solids. Cough noted x1. Chart review indicates pt is afebrile, lungs diminished. SLP will follow for assessment of diet tolerance and education.   HPI        SLP Plan  Continue with current plan of care       Recommendations  Diet recommendations: Dysphagia 3 (mechanical soft);Thin liquid Liquids provided via: Straw Medication Administration: Other (Comment)(as tolerated) Supervision: Patient able to self feed;Staff to assist with self feeding Compensations: Minimize environmental distractions;Slow rate;Small sips/bites Postural Changes and/or Swallow Maneuvers: Upright 30-60 min after meal;Seated upright 90 degrees                Oral Care Recommendations: Oral care BID Follow up Recommendations: Other (comment)(TBD) SLP Visit Diagnosis: Dysphagia, unspecified (R13.10) Plan: Continue with current plan of care       [place]. Quentin Ore [facility], CCC-SLP Speech Language Pathologist [phone]  Shonna Chock 08/04/2018, 12:33 PM

## 2018-08-04 NOTE — TOC Initial Note (Signed)
Transition of Care [facility]) - Initial/Assessment Note    Patient Details  Name: Hailey Hughes MRN: [id number] Date of Birth: [birth year]  Transition [facility] CM/SW Contact:    Joaquin Courts, RN Phone Number: 08/04/2018, 12:39 PM  Clinical Narrative:        CM spoke with patient at bedside and son, Hailey Hughes, by telephone. Cm informed patient of recommendation for SNF at d/c. Patient at this time is not ready to make a decision and is hopeful to be able to return home. On further discussion patient admits that she lives with her spouse who may not be able to assist her at home and states her son helps her sometimes. After speaking with her son, CM was informed that son is not in the home and can occasionally help out and the spouse in not able to help.  Son and patient are planning to discuss the best option for d/c. Cm will follow-up for discharge planning.             Expected Discharge Plan: Skilled Nursing Facility Barriers to Discharge: Continued Medical Work up   Patient Goals and CMS Choice Patient states their goals for this hospitalization and ongoing recovery are:: to go home      Expected Discharge Plan and Services Expected Discharge Plan: [place]   Discharge Planning Services: CM Consult   Living arrangements for the past 2 months: Single Family Home Expected Discharge Date: (unknown)               DME Arranged: N/A DME Agency: NA       HH Arranged: NA Mission Agency: NA        Prior Living Arrangements/Services Living arrangements for the past 2 months: Single Family Home Lives with:: Spouse Patient language and need for interpreter reviewed:: Yes Do you feel safe going back to the place where you live?: Yes      Need for Family Participation in Patient Care: Yes (Comment) Care giver support system in place?: Yes (comment)   Criminal Activity/Legal Involvement Pertinent to Current Situation/Hospitalization: No - Comment as  needed  Activities of Daily Living Home Assistive Devices/Equipment: Cane (specify quad or straight), Eyeglasses, Dentures (specify type), CBG Meter(upper partial plate, "hurricane" cane) ADL Screening (condition at time of admission) Patient's cognitive ability adequate to safely complete daily activities?: Yes Is the patient deaf or have difficulty hearing?: No Does the patient have difficulty seeing, even when wearing glasses/contacts?: No Does the patient have difficulty concentrating, remembering, or making decisions?: No Patient able to express need for assistance with ADLs?: Yes Does the patient have difficulty dressing or bathing?: Yes Independently performs ADLs?: No(currently-patient very sleepy) Communication: Independent Dressing (OT): Needs assistance Is this a change from baseline?: Change from baseline, expected to last >3 days Grooming: Needs assistance Is this a change from baseline?: Change from baseline, expected to last >3 days Feeding: Needs assistance Is this a change from baseline?: Change from baseline, expected to last >3 days Bathing: Needs assistance Is this a change from baseline?: Change from baseline, expected to last >3 days Toileting: Dependent Is this a change from baseline?: Change from baseline, expected to last >3days In/Out Bed: Dependent Is this a change from baseline?: Change from baseline, expected to last >3 days Walks in Home: Dependent Is this a change from baseline?: Change from baseline, expected to last >3 days Does the patient have difficulty walking or climbing stairs?: Yes Weakness of Legs: Both Weakness of Arms/Hands: Both  Permission Sought/Granted   Permission granted to share information with : Yes, Verbal Permission Granted  Share Information with NAME: Hailey Hua     Permission granted to share info w Relationship: son     Emotional Assessment Appearance:: Appears stated age Attitude/Demeanor/Rapport: Engaged Affect  (typically observed): Accepting Orientation: : Oriented to Self, Oriented to Place, Oriented to  Time, Oriented to Situation   Psych Involvement: No (comment)  Admission diagnosis:  Colitis [K52.9] Acute kidney injury (nontraumatic) (HCC) [N17.9] Acute UTI [N39.0] Elevated lactic acid level [R79.89] Elevated lipase [R74.8] Hypotension, unspecified hypotension type [I95.9] Patient Active Problem List   Diagnosis Date Noted  . Acute respiratory failure (HCC)   . Sepsis (HCC)   . 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 07/22/2018  . Hypotension   . Elevated lactic acid level   . CKD (chronic kidney disease) stage 3, GFR 30-59 ml/min (HCC) 12/30/2017  . 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  . Anxiety 04/07/2015  . Diabetes (HCC) 04/07/2015  . Hyperlipidemia 04/07/2015  . DYSPEPSIA 05/26/2008  . Epigastric pain 05/26/2008  . Hypothyroidism 05/21/2008  . DIVERTICULOSIS, COLON 05/21/2008  . Constipation 05/21/2008  . Stenosis of rectum and anus 05/21/2008  . FIBROSITIS 05/21/2008  . 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  . 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 Diverticulosis of large intestine without perforation or abscess without bleeding 05/21/2008   PCP:  Barbie Banner, MD Pharmacy:   [facility] [phone] - [place] - [address] [place] [address] Phone: [phone] Fax: [phone]     Social Determinants of Health (SDOH) Interventions    Readmission Risk Interventions No flowsheet data found.

## 2018-08-04 NOTE — Consult Note (Addendum)
[place] Nurse wound follow up Wound type: Full thickness post-op wound to midline abd Wound bed: red at base, yellow subcutaneous adipose tissue closer to skin level Drainage (amount, consistency, odor) Small amount serous Periwound:Intact.  Dressing procedure/placement/frequency: Two pieces of black foam removed and wound cleansed. Replaced one piece of black foam and foam is tucked into the undermined area at 12 o'clock. Foam covered with drape and attached to 166mmHg continuous negative pressure. An immediate seal is achieved. Pt was medicated prior to the procedure and tolerated with minimal amt discomfort.  [place] Nurse ostomy follow up Current pouch was leaking behind the barrier.  Stoma type/location: RUQ ileostomy Stomal assessment/size: 1 and 3/8 inch oval with os at center, red, moist Peristomal assessment: mild peristoma erythema, skin intact. Patchy areas of red rash surrounding; consistent with possible mild fungal appearance Output: green/brown effluent Ostomy pouching: 1pc.convex with skin barrier ring  Education provided: Patient remains in ICU; she watched pouch change procedure and asked appropriate questions.  Enrolled patient in [place] Start Discharge program: Yes  Supplies at the bedside for staff nurse use. Pouch is located in close proximity to the Vac dressing and both will need to be changed every M/W/F. [place] nursing team will follow, and will remain available to this patient, the nursing, surgical and medical teams. Our team will see M/W/F for NPWT dressing changes, ostomy pouch changes, and further teaching sessions. Julien Girt MSN, RN, [place], [place], [place]

## 2018-08-05 DIAGNOSIS — L8990 Pressure ulcer of unspecified site, unspecified stage: Secondary | ICD-10-CM | POA: Insufficient documentation

## 2018-08-05 LAB — GLUCOSE, CAPILLARY
Glucose-Capillary: 71 mg/dL (ref 70–99)
Glucose-Capillary: 105 mg/dL — ABNORMAL HIGH (ref 70–99)
Glucose-Capillary: 117 mg/dL — ABNORMAL HIGH (ref 70–99)
Glucose-Capillary: 129 mg/dL — ABNORMAL HIGH (ref 70–99)
Glucose-Capillary: 130 mg/dL — ABNORMAL HIGH (ref 70–99)

## 2018-08-05 LAB — CBC
HCT: 28.1 % — ABNORMAL LOW (ref 36.0–46.0)
Hemoglobin: 8.2 g/dL — ABNORMAL LOW (ref 12.0–15.0)
MCH: 27.7 pg (ref 26.0–34.0)
MCHC: 29.2 g/dL — ABNORMAL LOW (ref 30.0–36.0)
MCV: 94.9 fL (ref 80.0–100.0)
Platelets: 340 10*3/uL (ref 150–400)
RBC: 2.96 MIL/uL — ABNORMAL LOW (ref 3.87–5.11)
RDW: 15.6 % — ABNORMAL HIGH (ref 11.5–15.5)
WBC: 11.4 10*3/uL — ABNORMAL HIGH (ref 4.0–10.5)
nRBC: 0 % (ref 0.0–0.2)

## 2018-08-05 LAB — PHOSPHORUS: Phosphorus: 4.2 mg/dL (ref 2.5–4.6)

## 2018-08-05 LAB — BASIC METABOLIC PANEL
Anion gap: 7 (ref 5–15)
BUN: 70 mg/dL — ABNORMAL HIGH (ref 8–23)
CO2: 20 mmol/L — ABNORMAL LOW (ref 22–32)
Calcium: 8.2 mg/dL — ABNORMAL LOW (ref 8.9–10.3)
Chloride: 115 mmol/L — ABNORMAL HIGH (ref 98–111)
Creatinine, Ser: 1.22 mg/dL — ABNORMAL HIGH (ref 0.44–1.00)
GFR calc Af Amer: 48 mL/min — ABNORMAL LOW (ref >60)
GFR calc non Af Amer: 42 mL/min — ABNORMAL LOW (ref >60)
Glucose, Bld: 72 mg/dL (ref 70–99)
Potassium: 5.2 mmol/L — ABNORMAL HIGH (ref 3.5–5.1)
Sodium: 142 mmol/L (ref 135–145)

## 2018-08-05 MED ORDER — NYSTATIN 100000 UNIT/ML MT SUSP
5.0000 mL | Freq: Four times a day (QID) | OROMUCOSAL | Status: DC
  Administered 2018-08-05 – 2018-08-17 (×48): 500000 [IU] via ORAL
  Filled 2018-08-05 (×52): qty 5

## 2018-08-05 MED ORDER — TRAVASOL 10 % IV SOLN
INTRAVENOUS | Status: AC
  Administered 2018-08-05 – 2018-08-06 (×2): via INTRAVENOUS
  Filled 2018-08-05: qty 576

## 2018-08-05 MED ORDER — SODIUM ZIRCONIUM CYCLOSILICATE 5 G PO PACK
5.0000 g | PACK | Freq: Once | ORAL | Status: AC
  Administered 2018-08-05: 5 g via ORAL
  Filled 2018-08-05: qty 1

## 2018-08-05 MED ORDER — PRO-STAT SUGAR FREE PO LIQD
30.0000 mL | Freq: Two times a day (BID) | ORAL | Status: DC
  Administered 2018-08-05 – 2018-08-06 (×4): 30 mL via ORAL
  Filled 2018-08-05 (×4): qty 30

## 2018-08-05 MED ORDER — ENSURE ENLIVE PO LIQD
237.0000 mL | Freq: Two times a day (BID) | ORAL | Status: DC
  Administered 2018-08-05 – 2018-08-06 (×3): 237 mL via ORAL

## 2018-08-05 NOTE — Progress Notes (Signed)
PHARMACY - ADULT TOTAL PARENTERAL NUTRITION CONSULT NOTE   Pharmacy Consult for TPN Indication: s/p massive bowel resection  Patient Measurements: Height: 5\' 2"  (157.5 cm) Weight: 236 lb 5.3 oz (107.2 kg) IBW/kg (Calculated) : 50.1 TPN AdjBW (KG): 60.3 Body mass index is 43.23 kg/m.  Current Nutrition: soft diet starting 6/22, tolerated fulls IVF: none Central access: PICC TPN start date: 6/11  ASSESSMENT                                                                                                          HPI: 78 yo female s/p total abdominal colectomy, brooke ileostomy and cholecystecomy on 6/10 to start TPN.   Significant events:  6/19 clear liquid diet ordered, NGT removed 6/21 advancing to full liquid diet 6/22 advancing to soft diet, per Surgery request cut TPN to 50% 6/23 insulin not reduced by 50% yesterday resulting in CBGs trending down overnight, 86 >> 71. TPN stopped at 10am with plans to resume new bag at 1800   Today, 08/05/18  Glucose (goal CBGs < 150):   See above; insulin left at previous dose despite TPN reduced by 50% - CBGs low this AM. TPN stopped  Hx DM on metformin PTA  No SSI given since new TPN bag started yesterday PM  Electrolytes - Cl and K elevated, others WNL  Renal - SCr stable, bicarb low   LFTs - WNL, albumin low  TGs - improved to goal (< 150)  Prealbumin - still low but significantly improved  NUTRITIONAL GOALS                                                                                             RD recs: 2100-2300 kcal, 100-115 grams protein.   *recommendations updated 6/17 to increase Kcal goals now that patient off ventilator   Custom TPN at a new goal rate of 52ml/hr to provide  115 g/day protein and 2081 kcal/day.  PLAN                                                                                                                         Lokelma 5 g x 1 per MD  Stop TPN now to prevent hypoglycemia - will report  SZP  At 1800 today:  Continue custom TPN at 40 ml/hr per surgery while advancing to soft diet  Electrolytes - remove K and Phos, others unchanged; Cl:Ac = max Ac  TPN to contain standard multivitamins daily and trace elements MWF only due to national shortage  IVF per MD  Continue SSI/CBGs q4h, reduce insulin to 25 units insulin in TPN (as should have been done yesterday)  TPN lab panels on Mondays & Thursdays  Bmet, Mg, Phos tomorrow  Bernadene Person, PharmD, BCPS [phone] 08/05/2018, 9:24 AM

## 2018-08-05 NOTE — Progress Notes (Signed)
Patient ID: Hailey Hughes, female   DOB: 1940-05-14, 78 y.o.   MRN: [id number]    13 Days Post-Op  Subjective: Patient seems somewhat better overall today.  States she is still having some abdominal pain but it is improving.  Says she's eating some but not really sure how much.  Had a "gush" of fluid from her rectum yesterday.  Objective: Vital signs in last 24 hours: Temp:  [98.1 F (36.7 C)-99.7 F (37.6 C)] 98.7 F (37.1 C) (06/23 0400) Pulse Rate:  [63-95] 76 (06/23 0800) Resp:  [10-26] 25 (06/23 0800) BP: (103-171)/(29-79) 146/47 (06/23 0800) SpO2:  [90 %-96 %] 95 % (06/23 0800) Last BM Date: 08/04/18  Intake/Output from previous day: 06/22 0701 - 06/23 0700 In: 1719.8 [P.O.:480; I.V.:1229.8] Out: 2315 [Urine:2150; Drains:15; Stool:150] Intake/Output this shift: Total I/O In: 196.4 [I.V.:196.4] Out: -   PE: Abd: soft, much less tender, +BS, ileostomy working well, midline wound with VAC present.  JP Drain with some light tan/serous thing output. Only 15cc documented yesterday from drain.  Lab Results:  Recent Labs    08/04/18 0430  WBC 10.9*  HGB 8.3*  HCT 27.3*  PLT 315   BMET Recent Labs    08/04/18 0430 08/05/18 0632  NA 144 142  K 4.9 5.2*  CL 114* 115*  CO2 21* 20*  GLUCOSE 133* 72  BUN 82* 70*  CREATININE 1.23* 1.22*  CALCIUM 8.6* 8.2*   PT/INR No results for input(s): LABPROT, INR in the last 72 hours. CMP     Component Value Date/Time   NA 142 08/05/2018 0632   K 5.2 (H) 08/05/2018 0632   CL 115 (H) 08/05/2018 0632   CO2 20 (L) 08/05/2018 0632   GLUCOSE 72 08/05/2018 0632   BUN 70 (H) 08/05/2018 0632   CREATININE 1.22 (H) 08/05/2018 0632   CALCIUM 8.2 (L) 08/05/2018 0632   PROT 5.4 (L) 08/04/2018 0430   ALBUMIN 1.7 (L) 08/04/2018 0430   AST 35 08/04/2018 0430   ALT 33 08/04/2018 0430   ALKPHOS 99 08/04/2018 0430   BILITOT 0.5 08/04/2018 0430   GFRNONAA 42 (L) 08/05/2018 0632   GFRAA 48 (L) 08/05/2018 0632   Lipase      Component Value Date/Time   LIPASE 12 07/25/2018 0500       Studies/Results: No results found.  Anti-infectives: Anti-infectives (From admission, onward)   Start     Dose/Rate Route Frequency Ordered Stop   07/29/18 0600  meropenem (MERREM) 1 g in sodium chloride 0.9 % 100 mL IVPB  Status:  Discontinued     1 g 200 mL/hr over 30 Minutes Intravenous Every 12 hours 07/29/18 0457 08/03/18 1048   07/29/18 0500  ceFEPIme (MAXIPIME) 2 g in sodium chloride 0.9 % 100 mL IVPB  Status:  Discontinued     2 g 200 mL/hr over 30 Minutes Intravenous Every 24 hours 07/29/18 0436 07/29/18 0457   07/26/18 1400  piperacillin-tazobactam (ZOSYN) IVPB 3.375 g  Status:  Discontinued     3.375 g 12.5 mL/hr over 240 Minutes Intravenous Every 6 hours 07/26/18 0906 07/26/18 1339   07/26/18 1400  cefTRIAXone (ROCEPHIN) 1 g in sodium chloride 0.9 % 100 mL IVPB  Status:  Discontinued     1 g 200 mL/hr over 30 Minutes Intravenous Every 24 hours 07/26/18 1339 07/29/18 0436   07/23/18 0200  piperacillin-tazobactam (ZOSYN) IVPB 2.25 g  Status:  Discontinued     2.25 g 100 mL/hr over 30 Minutes Intravenous Every 6  hours 07/22/18 2248 07/26/18 0906   07/22/18 1800  piperacillin-tazobactam (ZOSYN) IVPB 3.375 g  Status:  Discontinued     3.375 g 12.5 mL/hr over 240 Minutes Intravenous Every 8 hours 07/22/18 0953 07/22/18 2248   07/22/18 1000  piperacillin-tazobactam (ZOSYN) IVPB 3.375 g     3.375 g 100 mL/hr over 30 Minutes Intravenous  Once 07/22/18 0953 07/22/18 1239   07/22/18 0745  ciprofloxacin (CIPRO) IVPB 400 mg     400 mg 200 mL/hr over 60 Minutes Intravenous  Once 07/22/18 0739 07/22/18 0921   07/22/18 0745  metroNIDAZOLE (FLAGYL) IVPB 500 mg     500 mg 100 mL/hr over 60 Minutes Intravenous  Once 07/22/18 0739 07/22/18 1024       Assessment/Plan Type 2 diabetes Hypothyroid Rheumatoid arthritis Struct of sleep apnea Morbid obesity BMI 36.58 AKI - Cr trending down 1.22 Malnutrition -  prealbumin11.5 Klebsiella UTI, resolved VDRF, resolved Thrombocytopenia - resolved  Septic shock Ischemic colitis, acute cholecystitis POD13,S/pExploratory laparotomy, total abdominal colectomy, [name], cholecystectomy-6/10-Dr. Derrell Lolling - CTAP 6/16 w/out intra-abdominal cause of fever and elevated WBC. There is noted RLL PNA, completed merrem treatment - cont VAC changes - having decent bowel function currently. -advance to soft diet, will add calorie count and leave on half rate TNA for now.  glucernas as well -prealbumin 11.5 -cont surgical JP drain at this time due to slightly brown appearing output.  It is possible that she decompressed some fluid out of her rectum yesterday that was residual vs a possible leak at her rectal stump.  Her abdominal exam is benign and if she does have a possible leak it appears well controlled currently with her drain.  No indications at this time for abx or any further work up currently.  If she were to develop new fevers, worsening abdominal pain, etc, then she may need further imaging.   ID -Meropenem completed FEN -IVF, TNA, half rate, soft diet, glucerna, calorie count VTE -SCDs Follow up -Dr. Marnette Burgess - son Onalee Hua [phone], husband Henreitta Cea [phone]   LOS: 14 days    Letha Cape , [facility] 08/05/2018, 8:58 AM Pager: [phone]

## 2018-08-05 NOTE — Progress Notes (Signed)
Physical Therapy Treatment Patient Details Name: Hailey Hughes MRN: [id number] DOB: May 12, 1940 Today's Date: 08/05/2018    History of Present Illness 78 year old white female admitted the hospital 6/9 with CT scan evidence of colitis transferred to the ICU  for septic shock, s/p colectomy and cholecystectomy 07/23/18    PT Comments    The patient is very weak, requires 2 total assist for sitting  On bed edge. Patient may require mechanical lift OOB. Continue PT.Patient sat 7-8 mins, gradually required assist for trunk as patient fatigued. Continue PT efforts.   Follow Up Recommendations  SNF     Equipment Recommendations  Other (comment)    Recommendations for Other Services       Precautions / Restrictions Precautions Precautions: Fall Precaution Comments: multiple lines, JP drain on L, A Line and PICC RUE, abdominal wound VAC, flexiseal Restrictions Weight Bearing Restrictions: No    Mobility  Bed Mobility Overal bed mobility: Needs Assistance Bed Mobility: Sit to Supine;Supine to Sit     Supine to sit: Max assist;+2 for physical assistance;+2 for safety/equipment Sit to supine: Max assist;Total assist;+2 for physical assistance;+2 for safety/equipment   General bed mobility comments: assist for LEs and trunk elevation, use of bed pad to scoot hips; pt able to assist with coming down onto L elbow with further assist for trunk/LEs when returning to sidelying/supine  Transfers                 General transfer comment: deferred today due to pt fatigue  Ambulation/Gait                 Stairs             Wheelchair Mobility    Modified Rankin (Stroke Patients Only)       Balance Overall balance assessment: Needs assistance Sitting-balance support: Single extremity supported;Bilateral upper extremity supported;Feet supported Sitting balance-Leahy Scale: Fair Sitting balance - Comments: able to maintain static sitting balance briefly with  close minguard assist and use of UE support on EOB; when fatigued pt requires increased assist for balance                                    Cognition Arousal/Alertness: Awake/alert Behavior During Therapy: [facility] for tasks assessed/performed;Anxious Overall Cognitive Status: Within Functional Limits for tasks assessed                                        Exercises      General Comments        Pertinent Vitals/Pain Pain Assessment: Faces Faces Pain Scale: Hurts even more Pain Location: abdomen Pain Descriptors / Indicators: Sore Pain Intervention(s): RN gave pain meds during session    Home Living                      Prior Function            PT Goals (current goals can now be found in the care plan section) Acute Rehab PT Goals Patient Stated Goal: to get stronger Progress towards PT goals: Progressing toward goals    Frequency    Min 2X/week      PT Plan Current plan remains appropriate    Co-evaluation PT/OT/SLP Co-Evaluation/Treatment: Yes Reason for Co-Treatment: For patient/therapist safety;Complexity of the patient's impairments (multi-system involvement)  PT goals addressed during session: Mobility/safety with mobility OT goals addressed during session: ADL's and self-care      AM-PAC PT "6 Clicks" Mobility   Outcome Measure  Help needed turning from your back to your side while in a flat bed without using bedrails?: Total Help needed moving from lying on your back to sitting on the side of a flat bed without using bedrails?: Total Help needed moving to and from a bed to a chair (including a wheelchair)?: Total Help needed standing up from a chair using your arms (e.g., wheelchair or bedside chair)?: Total Help needed to walk in hospital room?: Total Help needed climbing 3-5 steps with a railing? : Total 6 Click Score: 6    End of Session Equipment Utilized During Treatment: Oxygen Activity Tolerance:  Patient limited by fatigue Patient left: in bed;with call bell/phone within reach Nurse Communication: Mobility status;Need for lift equipment PT Visit Diagnosis: Unsteadiness on feet (R26.81);Difficulty in walking, not elsewhere classified (R26.2);Muscle weakness (generalized) (M62.81);Pain     Time: 1638-4665 PT Time Calculation (min) (ACUTE ONLY): 30 min  Charges:  $Therapeutic Activity: 8-22 mins                      Hailey Hughes PT Acute Rehabilitation Services Pager [phone] Office [phone]    Hailey Hughes 08/05/2018, 2:25 PM

## 2018-08-05 NOTE — Progress Notes (Addendum)
Occupational Therapy Treatment Patient Details Name: Hailey Hughes MRN: [id number] DOB: 04/12/1940 Today's Date: 08/05/2018    History of present illness 78 year old white female admitted the hospital 6/9 with CT scan evidence of colitis transferred to the ICU  for septic shock, s/p colectomy and cholecystectomy 07/23/18   OT comments  Pt with slow progress towards OT goals, presents supine in bed pleasant and agreeable to working with therapies. Pt tolerated sitting EOB today for approx 7-10 min, with varying levels of assist for sitting balance but pt is able to maintain static balance with UE support with minguard assist for brief period of time. Pt completing simple grooming ADL and self-feeding task with light minA - minguard assist. Pt fatigues easily and fatigued with seated activity today. VSS throughout session. Will continue per POC at this time.    Follow Up Recommendations  SNF    Equipment Recommendations  (too early for [facility])          Precautions / Restrictions Precautions Precautions: Fall Precaution Comments: multiple lines, JP drain on L, A Line and PICC RUE, abdominal wound VAC Restrictions Weight Bearing Restrictions: No       Mobility Bed Mobility Overal bed mobility: Needs Assistance Bed Mobility: Sit to Supine;Supine to Sit     Supine to sit: Max assist;+2 for physical assistance;+2 for safety/equipment Sit to supine: Max assist;Total assist;+2 for physical assistance;+2 for safety/equipment   General bed mobility comments: assist for LEs and trunk elevation, use of bed pad to scoot hips; pt able to assist with coming down onto L elbow with further assist for trunk/LEs when returning to sidelying/supine  Transfers                 General transfer comment: deferred today due to pt fatigue    Balance Overall balance assessment: Needs assistance Sitting-balance support: Single extremity supported;Bilateral upper extremity supported;Feet  supported Sitting balance-Leahy Scale: Fair Sitting balance - Comments: able to maintain static sitting balance briefly with close minguard assist and use of UE support on EOB; when fatigued pt requires increased assist for balance                                   ADL either performed or assessed with clinical judgement   ADL Overall ADL's : Needs assistance/impaired Eating/Feeding: Minimal assistance;Set up Eating/Feeding Details (indicate cue type and reason): pt able to grasp cup and bring to mouth, supervision for safety initially, positioned bedside table for easier access to drink containers Grooming: Min guard;Set up;Wash/dry face;Sitting Grooming Details (indicate cue type and reason): seated EOB, pt reliant on supported sitting at this time                             Functional mobility during ADLs: Maximal assistance;Total assistance;+2 for physical assistance;+2 for safety/equipment(bed mobility) General ADL Comments: pt continues to have limitations due to pain and weakness, pt is deconditioned; tolerated sitting EOB approx 7-10 min today                        Cognition Arousal/Alertness: Awake/alert Behavior During Therapy: WFL for tasks assessed/performed Overall Cognitive Status: Within Functional Limits for tasks assessed  Pertinent Vitals/ Pain       Pain Assessment: Faces Faces Pain Scale: Hurts even more Pain Location: abdomen Pain Descriptors / Indicators: Sore Pain Intervention(s): RN gave pain meds during session;Repositioned;Monitored during session                                                          Frequency  Min 2X/week        Progress Toward Goals  OT Goals(current goals can now be found in the care plan section)  Progress towards OT goals: Progressing toward goals  Acute Rehab OT Goals Patient  Stated Goal: to get stronger OT Goal Formulation: With patient Time For Goal Achievement: 08/13/18 Potential to Achieve Goals: Fair ADL Goals Pt Will Transfer to Toilet: with mod assist;with +2 assist;bedside commode;stand pivot transfer Additional ADL Goal #1: pt will perform self feeding, grooming and UB bathing with set up, supported sitting Additional ADL Goal #2: pt will go from sit to stand with mod +2 assist for adls and maintain with min A for 2 minutes Additional ADL Goal #3: pt will perform 2 sets of 10, AAROM to bil UEs to increase strength for adls Additional ADL Goal #4: pt will perform bed mobility with mod +2, sidelying<> sit in preparation for toilet transfers  Plan Discharge plan remains appropriate    Co-evaluation    PT/OT/SLP Co-Evaluation/Treatment: Yes Reason for Co-Treatment: Complexity of the patient's impairments (multi-system involvement);For patient/therapist safety;To address functional/ADL transfers   OT goals addressed during session: ADL's and self-care      AM-PAC OT "6 Clicks" Daily Activity     Outcome Measure   Help from another person eating meals?: A Little Help from another person taking care of personal grooming?: A Lot Help from another person toileting, which includes using toliet, bedpan, or urinal?: Total Help from another person bathing (including washing, rinsing, drying)?: Total Help from another person to put on and taking off regular upper body clothing?: A Lot Help from another person to put on and taking off regular lower body clothing?: Total 6 Click Score: 10    End of Session    OT Visit Diagnosis: Muscle weakness (generalized) (M62.81);Unsteadiness on feet (R26.81)   Activity Tolerance Patient tolerated treatment well;Patient limited by fatigue   Patient Left in bed;with call bell/phone within reach;with bed alarm set   Nurse Communication Mobility status        Time: 1610-9604 OT Time Calculation (min): 26  min  Charges: OT General Charges $OT Visit: 1 Visit OT Treatments $Self Care/Home Management : 8-22 mins  Lou Cal, [place] Pager [phone] Office [phone]    Raymondo Band 08/05/2018, 10:56 AM

## 2018-08-05 NOTE — Progress Notes (Addendum)
PROGRESS NOTE    Hailey Hughes  GYI:[id number] DOB: 1941-02-03 DOA: 07/22/2018 PCP: Christain Sacramento, MD   Brief Narrative:   78 year old with past medical history significant for multiple prior abdominal surgery, hypothyroidism, type 2 diabetes, rheumatoid arthritis, obstructive sleep apnea who was admitted on 07/22/2018 with colitis.  Patient was treated with IV fluids and IV antibiotics.  On 6/10: Patient received multiple boluses of IV fluids, remained hypotensive and she require phenylephrine infusion.  Patient require vasopressin on top of phenylephrine.  Patient had an episode of vomiting.  Patient developed worsening work of breathing, desaturation and worsening metabolic acidosis and ultimately require intubation. Lactic acidosis was getting worse, patient has been severe abdominal pain, surgical service was consulted.  Patient went emergent exploratory laparoscopy, found to have ischemic colitis and possibly ischemic gallbladder.  Patient underwent total colectomy with ileostomy placement as well as cholecystectomy. On 6/11: Acid-base was improving, vasoactive drip requirement decrease.  Chest x-ray with worsening airspace disease and right effusion.  Patient was subsequently extubated on 6/15.  Patient subsequently developed fever and hypotension on 6/16 back on pressors.  She developed altered mental status thought to be related to Dilaudid. CAT scan was repeated on 6/16: Dense consolidation in the right lower lobe.  No evidence of bowel obstruction.  Edematous small bowel in the left abdomen.  Mild intra-abdominal free fluid.  1 pocket of fluid in the right upper quadrant with minimal organization measures 5 cm.  Patient was started  on meropenem 6-16.    On 6/22-patient had questionable blockage of drain, significant effluent from her rectum. Surgery recommend continue to monitor for now.    Assessment & Plan:   Principal Problem:   Gangrenous ischemic colitis s/p colectomy/ileostomy  07/23/2018 Active Problems:   History of Hirschsprung's disease   Hypotension   Elevated lactic acid level   Acute respiratory failure (HCC)   Sepsis (HCC)   Pressure injury of skin  1-Acute hypoxic respiratory failure: From aspiration pneumonia pulmonary edema: Patient was treated with Meropenem from 6-16 until 6-23. 8 days treatment for PNA She is off oxygen during the day.  ETT 6/10 >>6/15   2-sepsis from ischemic colitis and bacterial translocation: Zosyn 6/9-6/13 Rocephin 6/13 >>6/16 8 days of meropenem.  S/P Status post laparotomy, total colectomy, [name], cholecystectomy: Monitor for fever.   3-ischemic colitis: Status post laparotomy, total colectomy, [name], cholecystectomy: Still on TPN.  Management per surgery.  CT abdomen; 6-16  Dense consolidation in the right lower lobe.  No evidence of bowel obstruction.  Edematous small bowel in the left abdomen.  Mild intra-abdominal free fluid.  1 pocket of fluid in the right upper quadrant with minimal organization measures 5 cm. Management per surgery.  Started on soft diet 6-22.  Moderate protein caloric malnutrition Still o TPN.  Started on soft diet.  Weaning TPN as needed.   Anemia: Monitor hemoglobin.  Check anemia panel.  Diabetes: Continue with SSI.   Hypothyroidism: Continue with synthroid.   AKI: Improved.  Continue to monitor.  On TPN.   Hyperkalemia;  Remove potasium from TPN.  Will give a dose of lokelma.   Deconditioning:needs PT, Rehab.   Pressure injury sacrum may deep tissue: Not present admission continue with local care  Pressure Injury 08/01/18 Sacrum Mid Deep Tissue Injury - Purple or maroon localized area of discolored intact skin or blood-filled blister due to damage of underlying soft tissue from pressure and/or shear. Maroon color about a half inch in height and 2  (  Active)  08/01/18 1200  Location: Sacrum  Location Orientation: Mid  Staging: Deep Tissue Injury -  Purple or maroon localized area of discolored intact skin or blood-filled blister due to damage of underlying soft tissue from pressure and/or shear.  Wound Description (Comments): Maroon color about a half inch in height and 2 cm in width with a small open slit.  Present on Admission: No     Nutrition Problem: Inadequate oral intake Etiology: inability to eat    Signs/Symptoms: NPO status    Interventions: TPN  Estimated body mass index is 43.23 kg/m as calculated from the following:   Height as of this encounter: 5\' 2"  (1.575 m).   Weight as of this encounter: 107.2 kg.   DVT prophylaxis: SCDs  code Status: Full code, of note patient would not want to be intubated for a long period of time Family Communication: Son updated 6-23 Disposition Plan: Monitor for 24-hour in the ICU, plan to transfer to the floor tomorrow if stable Consultants:   General surgery  CCM sign of  Procedures:  ETT 6/10 >>6/15  CVL left subclavian 6/10 >>6/16 RUE PICC 6/16 >> Arterial line 6/10 >> out RT radial a line 6/16 >> 6/19  Antimicrobials:  Zosyn 6/9-6/13 Rocephin 6/13 >>6/16 Meropenem 6/16 >>6-23  Subjective: Patient is alert, she is breathing better.  She denies worsening abdominal pain. Patient had increase output from her rectum yesterday.  Objective: Vitals:   08/05/18 0200 08/05/18 0300 08/05/18 0400 08/05/18 0500  BP: (!) 138/31 (!) 147/43 (!) 138/41 (!) 145/29  Pulse: 83 63 67 68  Resp: (!) 21 (!) 26 (!) 23 20  Temp:   98.7 F (37.1 C)   TempSrc:   Axillary   SpO2: 92% 90% 90% 91%  Weight:      Height:        Intake/Output Summary (Last 24 hours) at 08/05/2018 0709 Last data filed at 08/05/2018 0500 Gross per 24 hour  Intake 1719.78 ml  Output 2315 ml  Net -595.22 ml   Filed Weights   07/24/18 1132 07/30/18 0910 07/31/18 1016  Weight: 108.5 kg 112.3 kg 107.2 kg    Examination:  General exam: Appears calm and comfortable  Respiratory system: Clear to  auscultation. Respiratory effort normal. Cardiovascular system: S1 & S2 heard, RRR. No JVD, murmurs, rubs, gallops or clicks. No pedal edema. Gastrointestinal system: Colostomy in place with bag with dark fluid content, JP drain in place with dark gray material, wound VAC in place Central nervous system: Alert and oriented. No focal neurological deficits. Extremities: Symmetric 5 x 5 power. Skin: No rashes, lesions or ulcers Psychiatry: Judgement and insight appear normal. Mood & affect appropriate.     Data Reviewed: I have personally reviewed following labs and imaging studies  CBC: Recent Labs  Lab 07/30/18 0411 07/31/18 0500 08/01/18 0457 08/02/18 0600 08/04/18 0430  WBC 22.7* 9.4 9.1 6.9 10.9*  NEUTROABS  --   --   --   --  7.1  HGB 8.9* 7.4* 8.9* 8.7* 8.3*  HCT 30.5* 23.9* 28.4* 28.1* 27.3*  MCV 96.8 96.0 93.1 94.3 94.1  PLT 144* 147* 198 188 315   Basic Metabolic Panel: Recent Labs  Lab 07/31/18 0500 08/01/18 0457 08/02/18 0600 08/03/18 0500 08/04/18 0430  NA 142 148* 148* 145 144  K 5.0 3.8 3.6 4.4 4.9  CL 113* 116* 115* 115* 114*  CO2 21* 23 24 24  21*  GLUCOSE 363* 165* 198* 119* 133*  BUN 90* 101* 92*  89* 82*  CREATININE 1.60* 1.67* 1.42* 1.34* 1.23*  CALCIUM 7.8* 8.5* 8.5* 8.6* 8.6*  MG 2.2 2.4 2.2  --  2.3  PHOS 5.5* 4.6 4.1  --  3.5   GFR: Estimated Creatinine Clearance: 42 mL/min (A) (by C-G formula based on SCr of 1.23 mg/dL (H)). Liver Function Tests: Recent Labs  Lab 07/31/18 0500 08/04/18 0430  AST 23 35  ALT 13 33  ALKPHOS 51 99  BILITOT 0.2* 0.5  PROT 4.1* 5.4*  ALBUMIN 1.3* 1.7*   No results for input(s): LIPASE, AMYLASE in the last 168 hours. No results for input(s): AMMONIA in the last 168 hours. Coagulation Profile: No results for input(s): INR, PROTIME in the last 168 hours. Cardiac Enzymes: Recent Labs  Lab 07/29/18 1157  TROPONINI 0.03*   BNP (last 3 results) No results for input(s): PROBNP in the last 8760 hours.  HbA1C: No results for input(s): HGBA1C in the last 72 hours. CBG: Recent Labs  Lab 08/04/18 0342 08/04/18 0754 08/04/18 1203 08/04/18 1555 08/04/18 2203  GLUCAP 122* 141* 130* 118* 86   Lipid Profile: Recent Labs    08/04/18 0430  TRIG 128   Thyroid Function Tests: No results for input(s): TSH, T4TOTAL, FREET4, T3FREE, THYROIDAB in the last 72 hours. Anemia Panel: No results for input(s): VITAMINB12, FOLATE, FERRITIN, TIBC, IRON, RETICCTPCT in the last 72 hours. Sepsis Labs: Recent Labs  Lab 07/29/18 1157  LATICACIDVEN 1.6    Recent Results (from the past 240 hour(s))  Culture, blood (Routine X 2) w Reflex to ID Panel     Status: None   Collection Time: 07/29/18  5:46 AM   Specimen: BLOOD  Result Value Ref Range Status   Specimen Description   Final    BLOOD BLOOD RIGHT HAND Performed at [facility], [address] [place], [place] [address]    Special Requests   Final    BOTTLES DRAWN AEROBIC AND ANAEROBIC Blood Culture adequate volume Performed at [facility], [address] [place], [place] [address]    Culture   Final    NO GROWTH 5 DAYS Performed at [facility], [address] [place], [place] [address]    Report Status 08/03/2018 FINAL  Final  Culture, blood (Routine X 2) w Reflex to ID Panel     Status: None   Collection Time: 07/29/18  5:46 AM   Specimen: BLOOD  Result Value Ref Range Status   Specimen Description   Final    BLOOD BLOOD LEFT HAND Performed at [facility], [address] [place], [place] [address]    Special Requests   Final    BOTTLES DRAWN AEROBIC ONLY Blood Culture adequate volume Performed at [facility], [address] [place], [place] [address]    Culture   Final    NO GROWTH 5 DAYS Performed at [facility], [address] [place], [place] [address]    Report Status 08/03/2018 FINAL  Final         Radiology Studies:  No results found.      Scheduled Meds: . acetaminophen  1,000 mg Oral Q6H  . Chlorhexidine Gluconate Cloth  6 each Topical Daily  . feeding supplement (GLUCERNA SHAKE)  237 mL Oral TID BM  . insulin aspart  0-15 Units Subcutaneous TID WC  . insulin aspart  0-5 Units Subcutaneous QHS  . levothyroxine  112 mcg Oral Q0600  . mouth rinse  15  mL Mouth Rinse BID  . nystatin   Topical QODAY  . olopatadine  1 drop Both Eyes BID  . pantoprazole  40 mg Oral QHS  . sodium chloride flush  10-40 mL Intracatheter Q12H  . tobramycin-dexamethasone  1 application Both Eyes BID   Continuous Infusions: . sodium chloride    . TPN ADULT (ION) 40 mL/hr at 08/05/18 0400     LOS: 14 days    Time spent:35 minutes.     Alba CoryBelkys A Ritu Gagliardo, MD [facility] Pager [phone]  If 7PM-7AM, please contact night-coverage [email or web address] Password [email or web address] 08/05/2018, 7:09 AM

## 2018-08-05 NOTE — Progress Notes (Signed)
Initial Nutrition Assessment  RD working remotely.   DOCUMENTATION CODES:   Obesity unspecified  INTERVENTION:  - will d/c Glucerna Shake, each supplement provides 220 kcal and 10 grams protein. - will order Ensure Enlive BID, each supplement provides 350 kcal and 20 grams of protein. - will order 30 mL Prostat BID, each supplement provides 100 kcal and 15 grams of protein. - continue to encourage PO intakes. - initiate 48 hour Calorie Count (follow-up 6/24+6/25 for results)   NUTRITION DIAGNOSIS:   Increased nutrient needs related to acute illness as evidenced by estimated needs. -revised  GOAL:   Patient will meet greater than or equal to 90% of their needs -unmet  MONITOR:   PO intake, Supplement acceptance, Labs, Weight trends, Skin  REASON FOR ASSESSMENT:   Consult Assessment of nutrition requirement/status, Calorie Count  ASSESSMENT:   78 y.o. female with medical history significant for HTN, hyperlipidemia, prior abdominal surgery d/t megacolon when she was 78 years old, hypothyroidism, type 2 DM, and rheumatoid arthritis. She presented to [facility] due to abdominal pain, N/V x1 day. Stated she has been experiencing generalized weakness and thought it was due to constipation which she was experiencing for the 3 days PTA. Had an enema with minimal hard stool output 6/8. Since then has been experiencing worsening abdominal cramping with nausea and vomiting x2.  Significant Events: 6/9- admission 6/10- intubated; OGT placed; total colectomy, ileostomy, cholecystectomy 6/11- TPN started via CVC in R subclavian 6/15- extubated and OGT removed; diet advanced to CLD 6/16- triple lumen PICC placed in R cephalic; NGT placed for decompression 6/20- NGT removed 6/21- diet advancement begins 6/22- TPN weaning begins (decreased from 80 ml/hr to 40 ml/hr) 6/23- initiation of Calorie Count   Weight on 6/18 was consistent with weight on 6/11. Patient has not been weighed since 6/18.  NGT was removed on 6/20. Diet advanced from NPO to CLD on 6/19 at 2:50 PM and to [place] on 6/21 at 7:45 AM and to Soft on 6/22 at 8:25 AM. Per RN flow sheet, patient consumed 50% of dinner (on FLD) on 6/21 and no other recent intakes documented.   She is now receiving custom TPN at 40 ml/hr. Weaning began with advancement of diet.    Medications reviewed; sliding scale novolog, 112 mcg oral synthroid/day, 5 ml mycostatin QID. Labs reviewed; CBG: 71 mg/dl today, K: 5.2 mmol/l, Cl: 115 mmol/l, BUN: 70 mg/dl, creatinine: 1.22 mg/dl, Ca: 8.2 mg/dl.      Diet Order:   Diet Order            DIET SOFT Room service appropriate? Yes; Fluid consistency: Thin  Diet effective now              EDUCATION NEEDS:   Not appropriate for education at this time  Skin:  Skin Assessment: Skin Integrity Issues: Skin Integrity Issues:: DTI DTI: sacrum (new 6/19) Incisions: abdominal (6/10)  Last BM:  6/22  Height:   Ht Readings from Last 1 Encounters:  07/23/18 5\' 2"  (1.575 m)    Weight:   Wt Readings from Last 1 Encounters:  07/31/18 107.2 kg    Ideal Body Weight:  50 kg  BMI:  Body mass index is 43.23 kg/m.  Estimated Nutritional Needs:   Kcal:  2100-2300 kcal  Protein:  100-115 grams  Fluid:  >/= 1.8 L/day     Jarome Matin, MS, RD, LDN, [facility] Inpatient Clinical Dietitian Pager # [phone] After hours/weekend pager # [phone]

## 2018-08-06 DIAGNOSIS — E1122 Type 2 diabetes mellitus with diabetic chronic kidney disease: Secondary | ICD-10-CM

## 2018-08-06 DIAGNOSIS — E785 Hyperlipidemia, unspecified: Secondary | ICD-10-CM

## 2018-08-06 DIAGNOSIS — E440 Moderate protein-calorie malnutrition: Secondary | ICD-10-CM | POA: Diagnosis not present

## 2018-08-06 DIAGNOSIS — N183 Chronic kidney disease, stage 3 (moderate): Secondary | ICD-10-CM

## 2018-08-06 DIAGNOSIS — L8942 Pressure ulcer of contiguous site of back, buttock and hip, stage 2: Secondary | ICD-10-CM

## 2018-08-06 LAB — GLUCOSE, CAPILLARY
Glucose-Capillary: 148 mg/dL — ABNORMAL HIGH (ref 70–99)
Glucose-Capillary: 149 mg/dL — ABNORMAL HIGH (ref 70–99)
Glucose-Capillary: 118 mg/dL — ABNORMAL HIGH (ref 70–99)
Glucose-Capillary: 184 mg/dL — ABNORMAL HIGH (ref 70–99)

## 2018-08-06 LAB — BASIC METABOLIC PANEL
Anion gap: 5 (ref 5–15)
BUN: 66 mg/dL — ABNORMAL HIGH (ref 8–23)
CO2: 22 mmol/L (ref 22–32)
Calcium: 8.1 mg/dL — ABNORMAL LOW (ref 8.9–10.3)
Chloride: 115 mmol/L — ABNORMAL HIGH (ref 98–111)
Creatinine, Ser: 1.15 mg/dL — ABNORMAL HIGH (ref 0.44–1.00)
GFR calc Af Amer: 52 mL/min — ABNORMAL LOW (ref >60)
GFR calc non Af Amer: 45 mL/min — ABNORMAL LOW (ref >60)
Glucose, Bld: 182 mg/dL — ABNORMAL HIGH (ref 70–99)
Potassium: 5.2 mmol/L — ABNORMAL HIGH (ref 3.5–5.1)
Sodium: 142 mmol/L (ref 135–145)

## 2018-08-06 LAB — RETICULOCYTES
Immature Retic Fract: 26.9 % — ABNORMAL HIGH (ref 2.3–15.9)
RBC.: 2.69 MIL/uL — ABNORMAL LOW (ref 3.87–5.11)
Retic Count, Absolute: 54.9 10*3/uL (ref 19.0–186.0)
Retic Ct Pct: 2 % (ref 0.4–3.1)

## 2018-08-06 LAB — VITAMIN B12: Vitamin B-12: 2058 pg/mL — ABNORMAL HIGH (ref 180–914)

## 2018-08-06 LAB — MAGNESIUM: Magnesium: 2.1 mg/dL (ref 1.7–2.4)

## 2018-08-06 LAB — PHOSPHORUS: Phosphorus: 3.8 mg/dL (ref 2.5–4.6)

## 2018-08-06 LAB — IRON AND TIBC
Iron: 13 ug/dL — ABNORMAL LOW (ref 28–170)
Saturation Ratios: 7 % — ABNORMAL LOW (ref 10.4–31.8)
TIBC: 174 ug/dL — ABNORMAL LOW (ref 250–450)
UIBC: 161 ug/dL

## 2018-08-06 LAB — FERRITIN: Ferritin: 175 ng/mL (ref 11–307)

## 2018-08-06 LAB — FOLATE: Folate: 12.8 ng/mL (ref >5.9)

## 2018-08-06 MED ORDER — FLUCONAZOLE 150 MG PO TABS
150.0000 mg | ORAL_TABLET | Freq: Every day | ORAL | Status: AC
  Administered 2018-08-06 – 2018-08-08 (×3): 150 mg via ORAL
  Filled 2018-08-06 (×4): qty 1

## 2018-08-06 MED ORDER — SODIUM ZIRCONIUM CYCLOSILICATE 5 G PO PACK
5.0000 g | PACK | Freq: Once | ORAL | Status: AC
  Administered 2018-08-06: 5 g via ORAL
  Filled 2018-08-06: qty 1

## 2018-08-06 MED ORDER — TRAVASOL 10 % IV SOLN
INTRAVENOUS | Status: DC
  Administered 2018-08-06: 19:00:00 via INTRAVENOUS
  Filled 2018-08-06: qty 576

## 2018-08-06 NOTE — Progress Notes (Signed)
Patient ID: Hailey Hughes, female   DOB: [birth year], 78 y.o.   MRN: [id number]    14 Days Post-Op  Subjective: Patient feels well today.  Wanting to get up on the side of the bed today.  Ate fairly well yesterday it seems, but at least took in her protein shakes well too.    Objective: Vital signs in last 24 hours: Temp:  [98.2 F (36.8 C)-99.2 F (37.3 C)] 98.6 F (37 C) (06/24 0406) Pulse Rate:  [61-96] 82 (06/24 0700) Resp:  [8-27] 20 (06/24 0700) BP: (121-170)/(39-69) 121/43 (06/24 0700) SpO2:  [90 %-96 %] 93 % (06/24 0700) Last BM Date: 08/05/18  Intake/Output from previous day: 06/23 0701 - 06/24 0700 In: 836.7 [P.O.:240; I.V.:596.7] Out: 2060 [Urine:1400; Drains:110; Stool:550] Intake/Output this shift: No intake/output data recorded.  PE: Abd: soft, continues to be less tender each day, appropriately so, +BS, JP drain with tan cloudy output, same as what's in rectal pouch.  VAC in place (to be changed today)  Lab Results:  Recent Labs    08/04/18 0430 08/05/18 0830  WBC 10.9* 11.4*  HGB 8.3* 8.2*  HCT 27.3* 28.1*  PLT 315 340   BMET Recent Labs    08/05/18 0632 08/06/18 0253  NA 142 142  K 5.2* 5.2*  CL 115* 115*  CO2 20* 22  GLUCOSE 72 182*  BUN 70* 66*  CREATININE 1.22* 1.15*  CALCIUM 8.2* 8.1*   PT/INR No results for input(s): LABPROT, INR in the last 72 hours. CMP     Component Value Date/Time   NA 142 08/06/2018 0253   K 5.2 (H) 08/06/2018 0253   CL 115 (H) 08/06/2018 0253   CO2 22 08/06/2018 0253   GLUCOSE 182 (H) 08/06/2018 0253   BUN 66 (H) 08/06/2018 0253   CREATININE 1.15 (H) 08/06/2018 0253   CALCIUM 8.1 (L) 08/06/2018 0253   PROT 5.4 (L) 08/04/2018 0430   ALBUMIN 1.7 (L) 08/04/2018 0430   AST 35 08/04/2018 0430   ALT 33 08/04/2018 0430   ALKPHOS 99 08/04/2018 0430   BILITOT 0.5 08/04/2018 0430   GFRNONAA 45 (L) 08/06/2018 0253   GFRAA 52 (L) 08/06/2018 0253   Lipase     Component Value Date/Time   LIPASE 12  07/25/2018 0500       Studies/Results: No results found.  Anti-infectives: Anti-infectives (From admission, onward)   Start     Dose/Rate Route Frequency Ordered Stop   07/29/18 0600  meropenem (MERREM) 1 g in sodium chloride 0.9 % 100 mL IVPB  Status:  Discontinued     1 g 200 mL/hr over 30 Minutes Intravenous Every 12 hours 07/29/18 0457 08/03/18 1048   07/29/18 0500  ceFEPIme (MAXIPIME) 2 g in sodium chloride 0.9 % 100 mL IVPB  Status:  Discontinued     2 g 200 mL/hr over 30 Minutes Intravenous Every 24 hours 07/29/18 0436 07/29/18 0457   07/26/18 1400  piperacillin-tazobactam (ZOSYN) IVPB 3.375 g  Status:  Discontinued     3.375 g 12.5 mL/hr over 240 Minutes Intravenous Every 6 hours 07/26/18 0906 07/26/18 1339   07/26/18 1400  cefTRIAXone (ROCEPHIN) 1 g in sodium chloride 0.9 % 100 mL IVPB  Status:  Discontinued     1 g 200 mL/hr over 30 Minutes Intravenous Every 24 hours 07/26/18 1339 07/29/18 0436   07/23/18 0200  piperacillin-tazobactam (ZOSYN) IVPB 2.25 g  Status:  Discontinued     2.25 g 100 mL/hr over 30 Minutes Intravenous Every 6  hours 07/22/18 2248 07/26/18 0906   07/22/18 1800  piperacillin-tazobactam (ZOSYN) IVPB 3.375 g  Status:  Discontinued     3.375 g 12.5 mL/hr over 240 Minutes Intravenous Every 8 hours 07/22/18 0953 07/22/18 2248   07/22/18 1000  piperacillin-tazobactam (ZOSYN) IVPB 3.375 g     3.375 g 100 mL/hr over 30 Minutes Intravenous  Once 07/22/18 0953 07/22/18 1239   07/22/18 0745  ciprofloxacin (CIPRO) IVPB 400 mg     400 mg 200 mL/hr over 60 Minutes Intravenous  Once 07/22/18 0739 07/22/18 0921   07/22/18 0745  metroNIDAZOLE (FLAGYL) IVPB 500 mg     500 mg 100 mL/hr over 60 Minutes Intravenous  Once 07/22/18 0739 07/22/18 1024       Assessment/Plan Type 2 diabetes Hypothyroid Rheumatoid arthritis Struct of sleep apnea Morbid obesity BMI 36.58 AKI - Cr trending down 1.22 Malnutrition - prealbumin11.5 Klebsiella UTI, resolved VDRF,  resolved Thrombocytopenia - resolved  Septic shock Ischemic colitis, acute cholecystitis POD14,S/pExploratory laparotomy, total abdominal colectomy, [name], cholecystectomy-6/10-Dr. Dalbert Batman - CTAP 6/16 w/out intra-abdominal cause of fever and elevated WBC. There is noted RLL PNA, completed merrem treatment - cont VAC changes - having decent bowel function currently. -advance tosoft diet, will add calorie count and leave on half rate TNA for now.  glucernas as well.  If calorie count looks good then we can wean TNA to off. -prealbumin 11.5 -cont surgical JP drain at this time due to slightly brown appearing output.  It is possible that she decompressed some fluid out of her rectum yesterday that was residual vs a possible leak at her rectal stump.  Her abdominal exam is benign and if she does have a possible leak it appears well controlled currently with her drain.  No indications at this time for abx or any further work up currently.  If she were to develop new fevers, worsening abdominal pain, etc, then she may need further imaging.   ID -Meropenem completed FEN -IVF,TNA, half rate, soft diet, glucerna, calorie count VTE -SCDs Follow up -Dr. Adline Peals - son Shanon Brow [phone] 6/23), husband Hoy Morn [phone]   LOS: 83 days    Henreitta Cea , [facility] Surgery 08/06/2018, 8:41 AM Pager: [phone]

## 2018-08-06 NOTE — Consult Note (Signed)
[place] Nurse wound follow up Wound type: Midline surgical Measurement: 24cm x 6cm x 5cm with 5cm undermining at 12 o'clock Wound HER:DEYCXK and red and dry.  Somewhat of a false-bottom in wound bed that separates easily with a cotton-topped applicator. We will ensure placing foam deep into wound today. Drainage (amount, consistency, odor) serous in a small amount Periwound: erythematous with satellite lesions consistent with fungal overgrowth. The same is noted in the peristomal area.  At 5 o'clock there is a small 3cm x 2cm x 0.1cm area of adhesive related skin injury (MARSI). Dressing procedure/placement/frequency: One piece of black foam removed from wound bed. Strips of drape applied to periwound tissue. A piece of skin barrier ring is placed into the umbilical defect and at the most distal margin of the wound. A third piece of skin barrier ring is used to cover the area of MARSI. Two pieces of black foam are used to oblisterate the dead space and the dressing is sealed with drape. It is attached to continuous suction at 178mmHg and an immediate seal is achieved.  Patient seen today with CCS PA, Alferd Apa.  His assistance and expertise is appreciated. Diflucan is ordered (150mg  for 3 days) today.  [place] Nurse ostomy follow up Stoma type/location: RUQ ileosotmy Stomal assessment/size: 1 and 3/8 inch slightly oval with os at center. Minimally budded. Peristomal assessment: erythema with satellite lesions consistent with fungal overgrowth. Same noted around wound.   Treatment options for stomal/peristomal skin: skin barrier ring, convex pouch Output: green/black soft stool Ostomy pouching: 1pc.convex pouch with skin barrier ring  Education provided: None today Enrolled patient in [employer] Discharge program: Yes, previously  [place] nursing team will follow along with you while in house for NPWT dressing changes and will begin ostomy teaching when patient progresses to floor. We will  remain available to this patient, the surgical, nursing and medical teams.   Thanks, Maudie Flakes, MSN, RN, [place], Arther Abbott  Pager# [phone]

## 2018-08-06 NOTE — Progress Notes (Signed)
Calorie Count Note RD working remotely.   48 hour calorie count ordered yesterday.  Diet: Soft Supplements: Ensure Enlive BID and prostat BID  Breakfast 6/24: 35 kcal, 4 grams protein Lunch 6/23: 387 kcal, 31 grams Dinner 6/23: 264 kcal, 9 grams protein Supplements: consumed 75% of Ensure and prostat as part of lunch, 50% of Glucerna as part of dinner; separately consumed 100% Ensure (350 kcal, 20 grams protein).   Total intake: 1036 kcal (49% of minimum estimated needs)  64 protein (64% of minimum estimated needs)  Nutrition Dx:  Increased nutrient needs related to acute illness as evidenced by estimated needs.  Goal:  Patient will meet greater than or equal to 90% of their needs   Intervention:  - continue Ensure Enlive BID and prostat BID.  - continue to encourage PO intakes. - will follow-up 6/25 for day #2 results     Jarome Matin, MS, RD, LDN, [facility] Inpatient Clinical Dietitian Pager # [phone] After hours/weekend pager # [phone]

## 2018-08-06 NOTE — Progress Notes (Signed)
PHARMACY - ADULT TOTAL PARENTERAL NUTRITION CONSULT NOTE   Pharmacy Consult for TPN Indication: s/p massive bowel resection  Patient Measurements: Height: 5\' 2"  (157.5 cm) Weight: 236 lb 5.3 oz (107.2 kg) IBW/kg (Calculated) : 50.1 TPN AdjBW (KG): 60.3 Body mass index is 43.23 kg/m.  Current Nutrition: soft diet starting 6/22, TPN at 1/2 rate - 6/23 took ensure x 2, glucerna x 1, and pro-stat x 2 (1120 kcal, 80 g protein) IVF: none Central access: PICC TPN start date: 6/11  ASSESSMENT                                                                                                          HPI: 78 yo female s/p total abdominal colectomy, brooke ileostomy and cholecystecomy on 6/10 to start TPN.   Significant events:  6/19 clear liquid diet ordered, NGT removed 6/21 advancing to full liquid diet 6/22 advancing to soft diet, per Surgery request cut TPN to 50% 6/23 insulin not reduced by 50% yesterday resulting in CBGs trending down overnight, 86 >> 71. TPN stopped at 10am with plans to resume new bag at 1800 -  48 hr calorie count initiated   Today, 08/06/18  Glucose (goal CBGs < 150):   CBGs recovered appropriately after stopping incorrect TPN (range 105-130); SBG elevated this AM  Hx DM on metformin PTA  No SSI given yesterday, d/t changing SSI orders (late start times)  Electrolytes - K remains elevated despite stopping TPN and giving Lokelma 5 g x 1 yesterday, Cl remains elevated; Phos, Mg, Na, Ca stable WNL  Renal - SCr stable, bicarb low; BUN significantly elevated but trending down consistently, UOP adequate  LFTs - WNL, albumin low  TGs - improved to goal (< 150)  Prealbumin - still low but significantly improved  NUTRITIONAL GOALS                                                                                             RD recs: 2100-2300 kcal, 100-115 grams protein.     Custom TPN at goal rate of 31ml/hr to provide  115 g/day protein and 2081  kcal/day.  PLAN  May consider repeating Lokelma 5 g x 1 or increasing to 10 g x 1 if wanting to treat potassium  At 1800 today:  Continue custom TPN at 40 ml/hr while calorie count underway  Electrolytes - none except Mg; Cl:Ac = max Ac  TPN to contain standard multivitamins daily and trace elements MWF only due to national shortage  IVF per MD  Continue moderate SSI with meals and AC coverage as well as 25 units insulin in TPN  TPN lab panels on Mondays & Thursdays  Bernadene Person, PharmD, BCPS [phone] 08/06/2018, 7:28 AM

## 2018-08-06 NOTE — Progress Notes (Signed)
[facility] PROGRESS NOTE  Hailey Hughes Hailey Hughes DOB: 1940-12-23 DOA: 07/22/2018 PCP: Hailey [place], MD  Assessment/Plan: 1. 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  Surgery managing JP drain, half rate TPN hopeful to discontinue pending calorie count results, continue to monitor bowel function/VAC changes, pain control 2. Moderate protein calorie malnutrition.  Related to abdominal surgery/poor absorption.  Prealbumin 11.5.  Continuing half rate TPN, calorie count as mentioned above, tolerating soft diet, advance as tolerated per surgery recommendations. 3. Septic shock, resolved.  Likely bacterial translocation from ischemic colitis,  no longer requiring pressor support, completed Zosyn (6/9-6/13), Rocephin 6/13-6/16), meropenem 4. Acute hypoxic respiratory failure, resolved.  Intubated on 6/10, extubated on 6/15. Completed 8-day (6/16-2/23) meropenem therapy, likely multifactorial as related to aspiration pneumonia (right lower lobe consolidation on imaging) as well as severe sepsis from colitis 5. Hyperkalemia, persistent despite removing potassium from TPN, repeat dose of Lokelma, closely monitor anemia 6. Normocytic anemia.  Iron panel consistent with anemia of chronic disease, no acute blood loss, closely monitor CBC. 7. Type 2 diabetes.  Monitor CBG, continue sliding scale 8. Hypothyroidism continue Synthroid  9. Deconditioning, PT/OT recs SNF 10. Pressure injury of sacrum, not present on admission.  Skin intact, continue local care   Code Status: FULL Family Communication: Spoke with Hailey Hughes (son) at [phone] on 6/24. Updated on care of mom Disposition Plan: family wants her to be able to go to SNF, worried she isn't up to the conversation right now. Transfer from ICU to floor    Consultants:  PCCM, General surgery  Procedures:  Exploratory  Antibiotics: completed Zosyn (6/9-6/13),  Rocephin 6/13-6/16), meropenem  HPI/Subjective:  Hailey Hughes is an 78 y.o. 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  Her condition rapidly deteriorated needing transfer to ICU on day of admission for persistent hypotension and lactic acidosis with no response with aggressive IV fluid resuscitation and consistent with septic shock as she ultimately required pressor support, intubation for acute proximal respiratory failure, acute renal failure.Patient underwent exploratory laparotomy on 6/10 to progressively worsening abdominal exam concerning for peritonitis and was found to have ischemic colitis requiring abdominal colectomy, ileostomy placement and cholecystectomy.  I see you course complicated by prolonged weaning of pressor support, concern for aspiration given hypoxia and elevated requirement (6/14), and change in mentation though CT head at that time remained negative attributed to Dilaudid.   Did well with breakfast No abdominal pain  Objective: Vitals:   08/06/18 0900 08/06/18 1200  BP: (!) 129/49   Pulse: (!) 57   Resp: 12   Temp:  98.1 F (36.7 C)  SpO2: 96%     Intake/Output Summary (Last 24 hours) at 08/06/2018 1539 Last data filed at 08/06/2018 1532 Gross per 24 hour  Intake 640.37 ml  Output 2020 ml  Net -1379.63 ml   Filed Weights   07/24/18 1132 07/30/18 0910 07/31/18 1016  Weight: 108.5 kg 112.3 kg 107.2 kg    Exam:   General: Obese elderly female, no distress  Cardiovascular: Regular rate and rhythm, no edema  Respiratory: Normal respiratory effort, room air  Abdomen: Soft, bowel sounds present, JP drain with slightly cloudy output,  minimal output in rectal  pouch, VAC dressing in place  Musculoskeletal: Limited range of motion due to pain  Skin skin dry and intact  Neurologic slow speech but alert and oriented to person, place, context  Data Reviewed: Basic Metabolic Panel: Recent Labs  Lab 07/31/18 0500 08/01/18 0457 08/02/18 0600 08/03/18 0500 08/04/18 0430 08/05/18 0632 08/06/18 0253  NA 142 148* 148* 145 144 142 142  K 5.0 3.8 3.6 4.4 4.9 5.2* 5.2*  CL 113* 116* 115* 115* 114* 115* 115*  CO2 21* 23 24 24  21* 20* 22  GLUCOSE 363* 165* 198* 119* 133* 72 182*  BUN 90* 101* 92* 89* 82* 70* 66*  CREATININE 1.60* 1.67* 1.42* 1.34* 1.23* 1.22* 1.15*  CALCIUM 7.8* 8.5* 8.5* 8.6* 8.6* 8.2* 8.1*  MG 2.2 2.4 2.2  --  2.3  --  2.1  PHOS 5.5* 4.6 4.1  --  3.5 4.2 3.8   Liver Function Tests: Recent Labs  Lab 07/31/18 0500 08/04/18 0430  AST 23 35  ALT 13 33  ALKPHOS 51 99  BILITOT 0.2* 0.5  PROT 4.1* 5.4*  ALBUMIN 1.3* 1.7*   No results for input(s): LIPASE, AMYLASE in the last 168 hours. No results for input(s): AMMONIA in the last 168 hours. CBC: Recent Labs  Lab 07/31/18 0500 08/01/18 0457 08/02/18 0600 08/04/18 0430 08/05/18 0830  WBC 9.4 9.1 6.9 10.9* 11.4*  NEUTROABS  --   --   --  7.1  --   HGB 7.4* 8.9* 8.7* 8.3* 8.2*  HCT 23.9* 28.4* 28.1* 27.3* 28.1*  MCV 96.0 93.1 94.3 94.1 94.9  PLT 147* 198 188 315 340   Cardiac Enzymes: No results for input(s): CKTOTAL, CKMB, CKMBINDEX, TROPONINI in the last 168 hours. BNP (last 3 results) No results for input(s): BNP in the last 8760 hours.  ProBNP (last 3 results) No results for input(s): PROBNP in the last 8760 hours.  CBG: Recent Labs  Lab 08/05/18 1640 08/05/18 1934 08/05/18 2117 08/06/18 0804 08/06/18 1209  GLUCAP 117* 129* 130* 148* 149*    Recent Results (from the past 240 hour(s))  Culture, blood (Routine X 2) w Reflex to ID Panel     Status: None   Collection Time: 07/29/18  5:46 AM   Specimen: BLOOD  Result Value  Ref Range Status   Specimen Description   Final    BLOOD BLOOD RIGHT HAND Performed at [facility], [address] [place], [place]    Special Requests   Final    BOTTLES DRAWN AEROBIC AND ANAEROBIC Blood Culture adequate volume Performed at [facility], [address], [place], [place]    Culture   Final    NO GROWTH 5 DAYS Performed at [facility], [address], [place], [place]    Report Status 08/03/2018 FINAL  Final  Culture, blood (Routine X 2) w Reflex to ID Panel     Status: None   Collection Time: 07/29/18  5:46 AM   Specimen: BLOOD  Result Value Ref Range Status   Specimen Description   Final    BLOOD BLOOD LEFT HAND Performed at [facility], [address] [place], [place]    Special Requests   Final    BOTTLES DRAWN AEROBIC ONLY Blood Culture adequate volume Performed at [facility], [address] [place], [place]    Culture   Final    NO GROWTH 5 DAYS Performed at [facility], [address] [place], [place] [address]    Report Status 08/03/2018 FINAL  Final     Studies: No results found.  Scheduled Meds: . acetaminophen  1,000 mg Oral Q6H  . Chlorhexidine Gluconate Cloth  6 each Topical Daily  . feeding supplement (ENSURE ENLIVE)  237 mL Oral BID BM  . feeding supplement (PRO-STAT SUGAR FREE 64)  30 mL Oral BID BM  . fluconazole  150 mg Oral Daily  . insulin aspart  0-15 Units Subcutaneous TID WC  . insulin aspart  0-5 Units Subcutaneous QHS  . levothyroxine  112 mcg Oral Q0600  . mouth rinse  15 mL Mouth Rinse BID  . nystatin  5 mL Oral QID  . nystatin   Topical QODAY  . olopatadine  1 drop Both Eyes BID  . pantoprazole  40 mg Oral QHS  . sodium chloride flush  10-40 mL Intracatheter Q12H  . tobramycin-dexamethasone  1 application Both Eyes BID   Continuous Infusions: . sodium chloride    . TPN  ADULT (ION) 40 mL/hr at 08/06/18 1245  . TPN ADULT (ION)      Principal Problem:   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 Active Problems:   History of Hirschsprung's disease   Hypotension   Elevated lactic acid level   Acute respiratory failure (HCC)   Sepsis (HCC)   Pressure injury of skin      Shayla D Nettey  [facility]

## 2018-08-06 NOTE — Progress Notes (Signed)
  Speech Language Pathology Treatment: Dysphagia  Patient Details Name: Hailey Hughes MRN: [id number] DOB: 11-19-1940 Today's Date: 08/06/2018 Time: 3762-8315 SLP Time Calculation (min) (ACUTE ONLY): 20 min  Assessment / Plan / Recommendation Clinical Impression  Pt reports having to "be careful" with intake and taking "very small sips".  She denies baseline dysphagia nor baseline increased dyspnea with any activity.  Pt  willing to accept liquids only today stating she had "just brushed her teeth".  SLP had to feed pt due to her weakness.  Observed intake of Ensure and soda via straw.  Cough noted - most especially with thin liquid.  Advised pt to consider using tsp or cup bolus if note cough with intake via straw.  Also recommended she consume Ensure with meals if tolerated better.    Pt has suction in bed with her and turned on - she admits to coughing and expectorating some secretions *which maximizes her airway protection.  Encouraged he to keep strength of cough and expectoration.  Reviewed aspiration precautions using teach back.  Reflux also reported during this hospital stay by pt, advised her to speak to RN regarding these concerns.   HPI HPI: Patient is an 78 y.o. female with PMH: HTN, HLD, prior abdominal surgery due to megacolon when 78 years old, hypothyroidism, DM-2, RA, OSA, reflux esophagitis, who presented to [facility] due to abdominal pain, nausea and vomitting of one-day duration. She was admitted under primary diagnosis of sepsis secondary to generalized colitis, also with UTI. She underwent a colectomy and cholecystectomy on 07/23/18 and was intubated from 6/10-6/15.      SLP Plan  Continue with current plan of care       Recommendations  Diet recommendations: Dysphagia 3 (mechanical soft);Thin liquid Liquids provided via: Straw;Teaspoon;Cup Medication Administration: Other (Comment) Supervision: Staff to assist with self feeding Compensations: Minimize environmental  distractions;Slow rate;Small sips/bites Postural Changes and/or Swallow Maneuvers: Upright 30-60 min after meal;Seated upright 90 degrees                Oral Care Recommendations: Oral care BID Follow up Recommendations: Skilled Nursing facility SLP Visit Diagnosis: Dysphagia, unspecified (R13.10) Plan: Continue with current plan of care       GO               Hailey Hughes, [facility] SLP [place] Pager [phone] Office [phone]  Macario Golds 08/06/2018, 11:33 AM

## 2018-08-07 DIAGNOSIS — Z8719 Personal history of other diseases of the digestive system: Secondary | ICD-10-CM

## 2018-08-07 DIAGNOSIS — E039 Hypothyroidism, unspecified: Secondary | ICD-10-CM

## 2018-08-07 DIAGNOSIS — R140 Abdominal distension (gaseous): Secondary | ICD-10-CM

## 2018-08-07 LAB — PHOSPHORUS: Phosphorus: 5 mg/dL — ABNORMAL HIGH (ref 2.5–4.6)

## 2018-08-07 LAB — GLUCOSE, CAPILLARY
Glucose-Capillary: 150 mg/dL — ABNORMAL HIGH (ref 70–99)
Glucose-Capillary: 133 mg/dL — ABNORMAL HIGH (ref 70–99)
Glucose-Capillary: 150 mg/dL — ABNORMAL HIGH (ref 70–99)
Glucose-Capillary: 112 mg/dL — ABNORMAL HIGH (ref 70–99)
Glucose-Capillary: 148 mg/dL — ABNORMAL HIGH (ref 70–99)

## 2018-08-07 LAB — COMPREHENSIVE METABOLIC PANEL
ALT: 33 U/L (ref 0–44)
AST: 37 U/L (ref 15–41)
Albumin: 1.6 g/dL — ABNORMAL LOW (ref 3.5–5.0)
Alkaline Phosphatase: 111 U/L (ref 38–126)
Anion gap: 4 — ABNORMAL LOW (ref 5–15)
BUN: 65 mg/dL — ABNORMAL HIGH (ref 8–23)
CO2: 22 mmol/L (ref 22–32)
Calcium: 8.2 mg/dL — ABNORMAL LOW (ref 8.9–10.3)
Chloride: 115 mmol/L — ABNORMAL HIGH (ref 98–111)
Creatinine, Ser: 1.28 mg/dL — ABNORMAL HIGH (ref 0.44–1.00)
GFR calc Af Amer: 46 mL/min — ABNORMAL LOW (ref >60)
GFR calc non Af Amer: 39 mL/min — ABNORMAL LOW (ref >60)
Glucose, Bld: 154 mg/dL — ABNORMAL HIGH (ref 70–99)
Potassium: 5.1 mmol/L (ref 3.5–5.1)
Sodium: 141 mmol/L (ref 135–145)
Total Bilirubin: 0.2 mg/dL — ABNORMAL LOW (ref 0.3–1.2)
Total Protein: 5.2 g/dL — ABNORMAL LOW (ref 6.5–8.1)

## 2018-08-07 LAB — MAGNESIUM: Magnesium: 2.1 mg/dL (ref 1.7–2.4)

## 2018-08-07 MED ORDER — TRAVASOL 10 % IV SOLN
INTRAVENOUS | Status: DC
  Filled 2018-08-07: qty 576

## 2018-08-07 MED ORDER — INSULIN ASPART 100 UNIT/ML ~~LOC~~ SOLN
0.0000 [IU] | Freq: Three times a day (TID) | SUBCUTANEOUS | Status: DC
  Administered 2018-08-07 (×2): 2 [IU] via SUBCUTANEOUS
  Administered 2018-08-07: 3 [IU] via SUBCUTANEOUS
  Administered 2018-08-08: 2 [IU] via SUBCUTANEOUS
  Administered 2018-08-10: 23:00:00 3 [IU] via SUBCUTANEOUS
  Administered 2018-08-10: 2 [IU] via SUBCUTANEOUS
  Administered 2018-08-10 – 2018-08-11 (×3): 3 [IU] via SUBCUTANEOUS
  Administered 2018-08-12 – 2018-08-13 (×2): 2 [IU] via SUBCUTANEOUS
  Administered 2018-08-13: 1 [IU] via SUBCUTANEOUS
  Administered 2018-08-14: 3 [IU] via SUBCUTANEOUS
  Administered 2018-08-14: 2 [IU] via SUBCUTANEOUS
  Administered 2018-08-14: 3 [IU] via SUBCUTANEOUS
  Administered 2018-08-14: 2 [IU] via SUBCUTANEOUS
  Administered 2018-08-15: 3 [IU] via SUBCUTANEOUS
  Administered 2018-08-16: 2 [IU] via SUBCUTANEOUS
  Administered 2018-08-16: 12:00:00 3 [IU] via SUBCUTANEOUS
  Administered 2018-08-17 – 2018-08-18 (×3): 2 [IU] via SUBCUTANEOUS
  Administered 2018-08-18: 3 [IU] via SUBCUTANEOUS
  Administered 2018-08-18 – 2018-08-19 (×3): 2 [IU] via SUBCUTANEOUS

## 2018-08-07 MED ORDER — PRO-STAT SUGAR FREE PO LIQD
30.0000 mL | Freq: Three times a day (TID) | ORAL | Status: DC
  Administered 2018-08-08 – 2018-08-20 (×28): 30 mL via ORAL
  Filled 2018-08-07 (×30): qty 30

## 2018-08-07 MED ORDER — TRAVASOL 10 % IV SOLN
INTRAVENOUS | Status: AC
  Administered 2018-08-07: 17:00:00 via INTRAVENOUS
  Filled 2018-08-07: qty 576

## 2018-08-07 MED ORDER — ENSURE ENLIVE PO LIQD
237.0000 mL | Freq: Three times a day (TID) | ORAL | Status: DC
  Administered 2018-08-08 – 2018-08-13 (×9): 237 mL via ORAL

## 2018-08-07 NOTE — Progress Notes (Addendum)
[facility] PROGRESS NOTE  Rebie Peale Tribbey DXI:[id number] DOB: 11-11-1940 DOA: 07/22/2018 PCP: Christain Sacramento, MD  Assessment/Plan: 1. 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  Surgery managing JP drain, half rate TPN hopeful to discontinue pending calorie count results, continue to monitor bowel function/VAC changes, pain control 2. Moderate protein calorie malnutrition.  Related to abdominal surgery/poor absorption.  Prealbumin 11.5.  Continuing half rate TPN for another day to try to avoid NGT or TF, calorie count as mentioned above, tolerating soft diet, advance as tolerated per surgery recommendations. 3. Septic shock, resolved.  Likely bacterial translocation from ischemic colitis,  no longer requiring pressor support, completed Zosyn (6/9-6/13), Rocephin 6/13-6/16), meropenem 4. Acute hypoxic respiratory failure, resolved.  Intubated on 6/10, extubated on 6/15. Completed 8-day (6/16-2/23) meropenem therapy, likely multifactorial as related to aspiration pneumonia (right lower lobe consolidation on imaging) as well as severe sepsis from colitis. Encourage IS 5. Hyperkalemia, still high but improving at 5.1 today, no need for lokelma, repeat in am. Potassium removed from TPN fluid 6. AKI on CKD, Stage 3 resolved. Peak creatinine of 3. AKI from ATN.  baseline 1.4-1.6(care everywhere)  Stable at range of 1.2-1.3, 900 cc out so far today, closely monitor output, daily BMP, avoid nephrotoxins 7. Hyperphosphatemia. Unclear etiology as kidney function seems stable, will monitor output if worsens may consider binder? 8. Normocytic anemia.  Iron panel consistent with anemia of chronic disease, no acute blood loss, closely monitor CBC. 9. Type 2 diabetes.  Monitor CBG, continue sliding scale, 25 U insulin in TPN 10. Hypothyroidism continue Synthroid  11. Deconditioning, PT/OT recs SNF 12. Pressure injury of sacrum, not present on  admission.  Skin intact, continue local care   Code Status: FULL Family Communication: Spoke with Erleen Egner (son) at [phone] on 6/25. Updated on care of mom Disposition Plan: family wants her to be able to go to SNF,   Consultants:  PCCM, General surgery  Procedures:  Exploratory  Antibiotics: completed Zosyn (6/9-6/13), Rocephin 6/13-6/16), meropenem  HPI/Subjective:  SHIANE WENBERG is a 78 y.o. 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  Her condition rapidly deteriorated needing transfer to ICU on day of admission for persistent hypotension and lactic acidosis with no response with aggressive IV fluid resuscitation and consistent with septic shock as she ultimately required pressor support, intubation for acute proximal respiratory failure, acute renal failure.Patient underwent exploratory laparotomy on 6/10 to progressively worsening abdominal exam concerning for peritonitis and was found to have ischemic colitis requiring abdominal colectomy, ileostomy placement and cholecystectomy.  Hospital course complicated by prolonged weaning of pressor support, concern for aspiration given hypoxia and elevated requirement (6/14), and change in mentation though CT head at that time remained negative attributed to Dilaudid.   Sleepy today Minimal po intake No complaint of abdominal pain  Objective: Vitals:   08/07/18 0551 08/07/18 1414  BP: (!) 147/73 (!) 143/47  Pulse: 79 80  Resp: 16 16  Temp: 98.1 F (36.7 C) 97.8 F (36.6 C)  SpO2: 93% 98%    Intake/Output Summary (Last 24 hours) at 08/07/2018 1845 Last data filed at 08/07/2018 1713 Gross per 24 hour   Intake 589.13 ml  Output 695 ml  Net -105.87 ml   Filed Weights   07/30/18 0910 07/31/18 1016 08/07/18 0551  Weight: 112.3 kg 107.2 kg 110.1 kg    Exam:   General: Obese elderly female, no distress  Cardiovascular: Regular rate and rhythm, no edema  Respiratory: Normal respiratory effort, room air  Abdomen: Soft, bowel sounds present, JP drain with slightly cloudy output, minimal output in rectal pouch, VAC dressing in place  Skin skin dry and intact  Neurologic slow speech, sleepy but arousableoriented to person, place, context  Data Reviewed: Basic Metabolic Panel: Recent Labs  Lab 08/01/18 0457 08/02/18 0600 08/03/18 0500 08/04/18 0430 08/05/18 0632 08/06/18 0253 08/07/18 0408  NA 148* 148* 145 144 142 142 141  K 3.8 3.6 4.4 4.9 5.2* 5.2* 5.1  CL 116* 115* 115* 114* 115* 115* 115*  CO2 23 24 24  21* 20* 22 22  GLUCOSE 165* 198* 119* 133* 72 182* 154*  BUN 101* 92* 89* 82* 70* 66* 65*  CREATININE 1.67* 1.42* 1.34* 1.23* 1.22* 1.15* 1.28*  CALCIUM 8.5* 8.5* 8.6* 8.6* 8.2* 8.1* 8.2*  MG 2.4 2.2  --  2.3  --  2.1 2.1  PHOS 4.6 4.1  --  3.5 4.2 3.8 5.0*   Liver Function Tests: Recent Labs  Lab 08/04/18 0430 08/07/18 0408  AST 35 37  ALT 33 33  ALKPHOS 99 111  BILITOT 0.5 0.2*  PROT 5.4* 5.2*  ALBUMIN 1.7* 1.6*   No results for input(s): LIPASE, AMYLASE in the last 168 hours. No results for input(s): AMMONIA in the last 168 hours. CBC: Recent Labs  Lab 08/01/18 0457 08/02/18 0600 08/04/18 0430 08/05/18 0830  WBC 9.1 6.9 10.9* 11.4*  NEUTROABS  --   --  7.1  --   HGB 8.9* 8.7* 8.3* 8.2*  HCT 28.4* 28.1* 27.3* 28.1*  MCV 93.1 94.3 94.1 94.9  PLT 198 188 315 340   Cardiac Enzymes: No results for input(s): CKTOTAL, CKMB, CKMBINDEX, TROPONINI in the last 168 hours. BNP (last 3 results) No results for input(s): BNP in the last 8760 hours.  ProBNP (last 3 results) No results for input(s): PROBNP in the last 8760 hours.  CBG: Recent Labs  Lab  08/06/18 2235 08/07/18 0154 08/07/18 0750 08/07/18 1201 08/07/18 1622  GLUCAP 184* 150* 133* 150* 112*    Recent Results (from the past 240 hour(s))  Culture, blood (Routine X 2) w Reflex to ID Panel     Status: None   Collection Time: 07/29/18  5:46 AM   Specimen: BLOOD  Result Value Ref Range Status   Specimen Description   Final    BLOOD BLOOD RIGHT HAND Performed at [facility], [address], [place], [place]    Special Requests   Final    BOTTLES DRAWN AEROBIC AND ANAEROBIC Blood Culture adequate volume Performed at [facility], [address] [place], [place]    Culture   Final    NO GROWTH 5 DAYS Performed at [facility], [address], [place], [place]    Report Status 08/03/2018 FINAL  Final  Culture, blood (Routine X 2) w Reflex to ID Panel     Status: None   Collection Time: 07/29/18  5:46 AM   Specimen: BLOOD  Result Value Ref Range Status   Specimen Description   Final    BLOOD BLOOD LEFT HAND Performed at [facility], [address], [place], [place]    Special  Requests   Final    BOTTLES DRAWN AEROBIC ONLY Blood Culture adequate volume Performed at [facility], [address] [place], [place] [address]    Culture   Final    NO GROWTH 5 DAYS Performed at [facility], [address] [place], [place] [address]    Report Status 08/03/2018 FINAL  Final     Studies: No results found.  Scheduled Meds: . acetaminophen  1,000 mg Oral Q6H  . Chlorhexidine Gluconate Cloth  6 each Topical Daily  . feeding supplement (ENSURE ENLIVE)  237 mL Oral TID BM  . feeding supplement (PRO-STAT SUGAR FREE 64)  30 mL Oral TID  . fluconazole  150 mg Oral Daily  . insulin aspart  0-15 Units Subcutaneous TID AC & HS  . levothyroxine  112 mcg Oral Q0600  . mouth rinse  15 mL Mouth Rinse BID  . nystatin  5 mL Oral QID  . nystatin    Topical QODAY  . olopatadine  1 drop Both Eyes BID  . pantoprazole  40 mg Oral QHS  . sodium chloride flush  10-40 mL Intracatheter Q12H  . tobramycin-dexamethasone  1 application Both Eyes BID   Continuous Infusions: . sodium chloride    . TPN ADULT (ION) 40 mL/hr at 08/07/18 1714    Principal Problem:   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 Active Problems:   Hypothyroidism   History of Hirschsprung's disease   Hypotension   Elevated lactic acid level   CKD (chronic kidney disease) stage 3, GFR 30-59 ml/min (HCC)   Diabetes (HCC)   Hyperlipidemia   Acute respiratory failure (HCC)   Sepsis (HCC)   Pressure injury of skin   Moderate protein malnutrition (HCC)      Drema PryShayla D Nettey  [facility]

## 2018-08-07 NOTE — Progress Notes (Signed)
15 Days Post-Op   Subjective/Chief Complaint: Seems more tired this am. No complaints   Objective: Vital signs in last 24 hours: Temp:  [97.5 F (36.4 C)-98.4 F (36.9 C)] 98.1 F (36.7 C) (06/25 0551) Pulse Rate:  [72-91] 79 (06/25 0551) Resp:  [16-19] 16 (06/25 0551) BP: (139-164)/(30-73) 147/73 (06/25 0551) SpO2:  [92 %-98 %] 93 % (06/25 0551) Weight:  [110.1 kg] 110.1 kg (06/25 0551) Last BM Date: 08/06/18  Intake/Output from previous day: 06/24 0701 - 06/25 0700 In: 519.1 [P.O.:240; I.V.:279.1] Out: 940 [Urine:900; Drains:40] Intake/Output this shift: Total I/O In: 60 [P.O.:60] Out: -   General appearance: fatigued and no distress Resp: clear to auscultation bilaterally Cardio: regular rate and rhythm GI: soft, mild tenderness. vac along midline. drain output cloudy  Lab Results:  Recent Labs    08/05/18 0830  WBC 11.4*  HGB 8.2*  HCT 28.1*  PLT 340   BMET Recent Labs    08/06/18 0253 08/07/18 0408  NA 142 141  K 5.2* 5.1  CL 115* 115*  CO2 22 22  GLUCOSE 182* 154*  BUN 66* 65*  CREATININE 1.15* 1.28*  CALCIUM 8.1* 8.2*   PT/INR No results for input(s): LABPROT, INR in the last 72 hours. ABG No results for input(s): PHART, HCO3 in the last 72 hours.  Invalid input(s): PCO2, PO2  Studies/Results: No results found.  Anti-infectives: Anti-infectives (From admission, onward)   Start     Dose/Rate Route Frequency Ordered Stop   08/06/18 1400  fluconazole (DIFLUCAN) tablet 150 mg     150 mg Oral Daily 08/06/18 1205 08/09/18 0959   07/29/18 0600  meropenem (MERREM) 1 g in sodium chloride 0.9 % 100 mL IVPB  Status:  Discontinued     1 g 200 mL/hr over 30 Minutes Intravenous Every 12 hours 07/29/18 0457 08/03/18 1048   07/29/18 0500  ceFEPIme (MAXIPIME) 2 g in sodium chloride 0.9 % 100 mL IVPB  Status:  Discontinued     2 g 200 mL/hr over 30 Minutes Intravenous Every 24 hours 07/29/18 0436 07/29/18 0457   07/26/18 1400  piperacillin-tazobactam  (ZOSYN) IVPB 3.375 g  Status:  Discontinued     3.375 g 12.5 mL/hr over 240 Minutes Intravenous Every 6 hours 07/26/18 0906 07/26/18 1339   07/26/18 1400  cefTRIAXone (ROCEPHIN) 1 g in sodium chloride 0.9 % 100 mL IVPB  Status:  Discontinued     1 g 200 mL/hr over 30 Minutes Intravenous Every 24 hours 07/26/18 1339 07/29/18 0436   07/23/18 0200  piperacillin-tazobactam (ZOSYN) IVPB 2.25 g  Status:  Discontinued     2.25 g 100 mL/hr over 30 Minutes Intravenous Every 6 hours 07/22/18 2248 07/26/18 0906   07/22/18 1800  piperacillin-tazobactam (ZOSYN) IVPB 3.375 g  Status:  Discontinued     3.375 g 12.5 mL/hr over 240 Minutes Intravenous Every 8 hours 07/22/18 0953 07/22/18 2248   07/22/18 1000  piperacillin-tazobactam (ZOSYN) IVPB 3.375 g     3.375 g 100 mL/hr over 30 Minutes Intravenous  Once 07/22/18 0953 07/22/18 1239   07/22/18 0745  ciprofloxacin (CIPRO) IVPB 400 mg     400 mg 200 mL/hr over 60 Minutes Intravenous  Once 07/22/18 0739 07/22/18 0921   07/22/18 0745  metroNIDAZOLE (FLAGYL) IVPB 500 mg     500 mg 100 mL/hr over 60 Minutes Intravenous  Once 07/22/18 0739 07/22/18 1024      Assessment/Plan: s/p Procedure(s): TOTAL ABDOMINAL COLECTOMY, [name], CHOLECYSTECTOMY (N/A) Advance diet. Encourage soft foods  Type 2 diabetes Hypothyroid Rheumatoid arthritis Struct of sleep apnea Morbid obesity BMI 36.58 AKI - Cr trending down 1.22 Malnutrition - prealbumin11.5 Klebsiella UTI, resolved VDRF,resolved Thrombocytopenia -resolved  Septic shock Ischemic colitis, acute cholecystitis POD14,S/pExploratory laparotomy, total abdominal colectomy, [name], cholecystectomy-6/10-Dr. Derrell Lolling - CTAP 6/16 w/out intra-abdominal cause of fever and elevated WBC. There is noted RLL PNA, completed merrem treatment - cont VAC changes - having decent bowel function currently. -advance tosoft diet, will add calorie count and leave on half rate TNA for now.  glucernas as well.  If calorie count looks good then we can wean TNA to off. -prealbumin 11.5 -cont surgical JP drain at this time due to slightly brown appearing output. It is possible that she decompressed some fluid out of her rectum yesterday that was residual vs a possible leak at her rectal stump. Her abdominal exam is benign and if she does have a possible leak it appears well controlled currently with her drain. No indications at this time for abx or any further work up currently. If she were to develop new fevers, worsening abdominal pain, etc, then she may need further imaging.   ID -Meropenem completed FEN -IVF,TNA, half rate, soft diet, glucerna, calorie count VTE -SCDs Follow up -Dr. Marnette Burgess - son Onalee Hua [phone] 6/23), husband Henreitta Cea [phone]   LOS: 16 days    Chevis Pretty III 08/07/2018

## 2018-08-07 NOTE — Care Management Important Message (Signed)
Important Message  Patient Details IM Letter given to Rhea Pink SW to present to the Patient Name: Hailey Hughes MRN: [id number] Date of Birth: 28-May-1940   Medicare Important Message Given:  Yes     Kerin Salen 08/07/2018, 9:45 AM

## 2018-08-07 NOTE — Progress Notes (Signed)
Patient transferred to floor, 5E. No c/o, no pain reported. Drowsy.  Vitals 139/54, 74, 97.7. sat 94% on 2L [place].Tele On. Patient oriented to room and staff. Will continue to monitor.

## 2018-08-07 NOTE — Progress Notes (Signed)
Midline wound from 6/24 Vac Change. Please see picture below.  Measurement is 24cm x 6cm x 5cm with 5cm undermining at the 12 o'clock position. There was somewhat of a false bottom. Would is beffy red with areas of dryness. The peri-wound appears erythematous c/w fungal infection. She was started on Diflucan x 3 days. She was placed back on Vac. [place] nurse expertise appreciated.

## 2018-08-07 NOTE — Progress Notes (Signed)
PHARMACY - ADULT TOTAL PARENTERAL NUTRITION CONSULT NOTE   Pharmacy Consult for TPN Indication: s/p massive bowel resection  Patient Measurements: Height: 5\' 2"  (157.5 cm) Weight: 242 lb 11.6 oz (110.1 kg) IBW/kg (Calculated) : 50.1 TPN AdjBW (KG): 60.3 Body mass index is 44.4 kg/m.  Current Nutrition: soft diet starting 6/22, TPN at 1/2 rate - took prostat x 2 and Ensure x 1 yesterday IVF: none Central access: PICC TPN start date: 6/11  ASSESSMENT                                                                                                          HPI: 78 yo female s/p total abdominal colectomy, brooke ileostomy and cholecystecomy on 6/10 to start TPN.   Significant events:  6/19 clear liquid diet ordered, NGT removed 6/21 advancing to full liquid diet 6/22 advancing to soft diet, per Surgery request cut TPN to 50% 6/23 insulin not reduced by 50% yesterday resulting in CBGs trending down overnight, 86 >> 71. TPN stopped at 10am with plans to resume new bag at 1800 -  48 hr calorie count initiated   Today, 08/07/18  Glucose (goal CBGs < 150):   CBGs mostly at goal (118-184) on mSSI and 25 units in TPN  Hx DM on metformin PTA  4 units SSI given yesterday  Electrolytes - K still elevated but improved; Cl remains elevated; Phos newly elevated today; Mg, Na, Ca stable WNL  Renal - SCr starting to trend back up, bicarb low; BUN significantly elevated but trending down, UOP appears low or incompletely charted  LFTs - WNL, albumin low  TGs - improved to goal (< 150)  Prealbumin - still low but significantly improved  NUTRITIONAL GOALS                                                                                             RD recs: 2100-2300 kcal, 100-115 grams protein.     Custom TPN at goal rate of 82ml/hr to provide  115 g/day protein and 2081 kcal/day.  PLAN  At 1800 today:  Continue custom TPN at 40 ml/hr while calorie count underway  Electrolytes - none except Mg; Cl:Ac = max Ac  TPN to contain standard multivitamins daily and trace elements MWF only due to national shortage  IVF per MD - discussed signs of worsening renal function with MD, recommended increased hydration as clinically appropriate  Continue moderate SSI TID AC but will change HS coverage to full moderate SSI (since TPN still infusing at same rate 24/7); continue 25 units insulin in TPN  TPN lab panels on Mondays & Thursdays  Bmet, Phos tomorrow  Bernadene Person, PharmD, BCPS [phone] 08/07/2018, 8:19 AM

## 2018-08-07 NOTE — Progress Notes (Addendum)
Nutrition Follow-up  DOCUMENTATION CODES:   Obesity unspecified  INTERVENTION:  - continue Ensure Enlive and prostat but will increase both supplements to TID. - continue to encourage PO intakes.  - continue current TPN regimen for at least the next 24 hours.  NUTRITION DIAGNOSIS:   Increased nutrient needs related to acute illness as evidenced by estimated needs. -ongoing  GOAL:   Patient will meet greater than or equal to 90% of their needs -unmet  MONITOR:   PO intake, Supplement acceptance, Labs, Weight trends, Skin  ASSESSMENT:   78 y.o. female with medical history significant for HTN, hyperlipidemia, prior abdominal surgery d/t megacolon when she was 78 years old, hypothyroidism, type 2 DM, and rheumatoid arthritis. She presented to [facility] due to abdominal pain, N/V x1 day. Stated she has been experiencing generalized weakness and thought it was due to constipation which she was experiencing for the 3 days PTA. Had an enema with minimal hard stool output 6/8. Since then has been experiencing worsening abdominal cramping with nausea and vomiting x2.  Weight slightly up, +1.6 kg/3 lb since 6/11 (admission weight of 200 lb on 6/9 appears to have been a stated weight). RN flow sheet indicates 25% of breakfast completed this AM, but at the time of RD visit ~8:45 AM, breakfast tray appeared untouched and patient was sleeping. Calorie Count ends today. Patient consumed 50% of a cup of juice today (40 kcal, 1 grams protein) and the only other meal ticket from yesterday is dinner when she consumed 100% of pears (60 kcal, 0 grams protein) and 100% of an Ensure (350 kcal, 20 grams protein).   Per review of orders, she has accepted 3 of 4 bottles of Ensure since 6/23 (each provides 350 kcal and 20 grams protein) and all 4 packets of prostat offered (each provides 100 kcal and 15 grams protein). She continues with TPN at half rate of 40 ml/hr.   Per notes: poor absorption with TPN at half rate  to encourage PO intakes, septic shock--resolved, acute hypoxic respiratory failure--resolved, deconditioning with PT/OT recommending SNF.   Able to talk with Pharmacist via secure chat and Dr. Marlou Starks on the phone. Plan at this time will be to continue current TPN regimen for another day, continue to encourage PO intakes, and no plan currently for small bore NGT with TF in the hopes that can avoid this and increase PO intakes.     Medications reviewed; sliding scale novolog, 112 mcg oral synthroid/day, 5 ml oral mycostatin QID.  Labs reviewed; CBGs: 150, 133 mg/dl today, Cl: 115 mmol/l, BUN: 65 mg/dl, creatinine: 1.28 mg/dl, Ca: 8.2 mg/dl, Phos: 5 mg/dl, GFR: 39 ml/min, vitamin B12: 2058 pg/ml, triglycerides: 128 mg/dl on 6/22.    Diet Order:   Diet Order            DIET SOFT Room service appropriate? Yes; Fluid consistency: Thin  Diet effective now              EDUCATION NEEDS:   Not appropriate for education at this time  Skin:  Skin Assessment: Skin Integrity Issues: Skin Integrity Issues:: DTI DTI: sacrum (new 6/19) Incisions: abdominal (6/10)  Last BM:  6/24 x2 (type 7 both times)  Height:   Ht Readings from Last 1 Encounters:  07/23/18 5\' 2"  (1.575 m)    Weight:   Wt Readings from Last 1 Encounters:  08/07/18 110.1 kg    Ideal Body Weight:  50 kg  BMI:  Body mass index is 44.4 kg/m.  Estimated Nutritional Needs:   Kcal:  2100-2300 kcal  Protein:  100-115 grams  Fluid:  >/= 1.8 L/day     Trenton Gammon, MS, RD, LDN, [facility] Inpatient Clinical Dietitian Pager # [phone] After hours/weekend pager # [phone]

## 2018-08-08 ENCOUNTER — Inpatient Hospital Stay (HOSPITAL_COMMUNITY): Payer: Medicare Other

## 2018-08-08 DIAGNOSIS — R188 Other ascites: Secondary | ICD-10-CM

## 2018-08-08 LAB — GLUCOSE, CAPILLARY
Glucose-Capillary: 109 mg/dL — ABNORMAL HIGH (ref 70–99)
Glucose-Capillary: 127 mg/dL — ABNORMAL HIGH (ref 70–99)
Glucose-Capillary: 79 mg/dL (ref 70–99)
Glucose-Capillary: 84 mg/dL (ref 70–99)
Glucose-Capillary: 87 mg/dL (ref 70–99)

## 2018-08-08 LAB — CBC
HCT: 25.8 % — ABNORMAL LOW (ref 36.0–46.0)
Hemoglobin: 7.7 g/dL — ABNORMAL LOW (ref 12.0–15.0)
MCH: 28.6 pg (ref 26.0–34.0)
MCHC: 29.8 g/dL — ABNORMAL LOW (ref 30.0–36.0)
MCV: 95.9 fL (ref 80.0–100.0)
Platelets: 392 10*3/uL (ref 150–400)
RBC: 2.69 MIL/uL — ABNORMAL LOW (ref 3.87–5.11)
RDW: 15.3 % (ref 11.5–15.5)
WBC: 10 10*3/uL (ref 4.0–10.5)
nRBC: 0 % (ref 0.0–0.2)

## 2018-08-08 LAB — BASIC METABOLIC PANEL
Anion gap: 6 (ref 5–15)
BUN: 55 mg/dL — ABNORMAL HIGH (ref 8–23)
CO2: 22 mmol/L (ref 22–32)
Calcium: 8.3 mg/dL — ABNORMAL LOW (ref 8.9–10.3)
Chloride: 115 mmol/L — ABNORMAL HIGH (ref 98–111)
Creatinine, Ser: 0.99 mg/dL (ref 0.44–1.00)
GFR calc Af Amer: >60 mL/min (ref >60)
GFR calc non Af Amer: 54 mL/min — ABNORMAL LOW (ref >60)
Glucose, Bld: 111 mg/dL — ABNORMAL HIGH (ref 70–99)
Potassium: 4.9 mmol/L (ref 3.5–5.1)
Sodium: 143 mmol/L (ref 135–145)

## 2018-08-08 LAB — PHOSPHORUS: Phosphorus: 3.8 mg/dL (ref 2.5–4.6)

## 2018-08-08 MED ORDER — IOHEXOL 300 MG/ML  SOLN
30.0000 mL | Freq: Once | INTRAMUSCULAR | Status: AC | PRN
  Administered 2018-08-08: 18:00:00 30 mL via ORAL

## 2018-08-08 MED ORDER — SODIUM CHLORIDE (PF) 0.9 % IJ SOLN
INTRAMUSCULAR | Status: AC
  Filled 2018-08-08: qty 50

## 2018-08-08 MED ORDER — IOHEXOL 300 MG/ML  SOLN
100.0000 mL | Freq: Once | INTRAMUSCULAR | Status: AC | PRN
  Administered 2018-08-08: 100 mL via INTRAVENOUS

## 2018-08-08 NOTE — TOC Progression Note (Signed)
Transition of Care [facility]) - Progression Note    Patient Details  Name: Hailey Hughes MRN: [id number] Date of Birth: April 13, 1940  Transition of Care [facility]) CM/SW [place], [place] Phone Number: [phone] 08/08/2018, 2:39 PM  Clinical Narrative:   Discussed DC plans again today with pt - she states at this point she and her family are hoping for SNF placement, given that "going straight home would be great but probably isn't feasible." Pt mentioned [place] and is open to other options as well depending on bed offers. CSW explained referral process and that pt's insurance requires prior authorization once facility is selected. Completed FL2 and referrals.    Expected Discharge Plan: [place] Barriers to Discharge: Continued Medical Work up  Expected Discharge Plan and Services Expected Discharge Plan: [place]   Discharge Planning Services: CM Consult   Living arrangements for the past 2 months: Single Family Home Expected Discharge Date: (unknown)               DME Arranged: N/A DME Agency: NA       HH Arranged: NA HH Agency: NA         Social Determinants of Health (SDOH) Interventions    Readmission Risk Interventions No flowsheet data found.

## 2018-08-08 NOTE — NC FL2 (Signed)
[place] MEDICAID FL2 LEVEL OF CARE SCREENING TOOL     IDENTIFICATION  Patient Name: Hailey Hughes Birthdate: 11/24/1940 Sex: female Admission Date (Current Location): 07/22/2018  [facility] and [place] Number:  Producer, television/film/video and Address:  [facility],  [address], [place] [address]      Provider Number: [id number]  Attending Physician Name and Address:  Laverna Peace, MD  Relative Name and Phone Number:       Current Level of Care: Hospital Recommended Level of Care: Skilled Nursing Facility Prior Approval Number:    Date Approved/Denied:   PASRR Number: [id number]  Discharge Plan: SNF    Current Diagnoses: Patient Active Problem List   Diagnosis Date Noted  . Hyperphosphatemia 08/07/2018  . Moderate protein malnutrition (HCC) 08/06/2018  . Pressure injury of skin 08/05/2018  . Acute respiratory failure (HCC)   . Sepsis (HCC)   . 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 07/22/2018  . Hypotension   . Elevated lactic acid level   . CKD (chronic kidney disease) stage 3, GFR 30-59 ml/min (HCC) 12/30/2017  . 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  . Anxiety 04/07/2015  . Diabetes (HCC) 04/07/2015  . Hyperlipidemia 04/07/2015  . DYSPEPSIA 05/26/2008  . Epigastric pain 05/26/2008  . Hypothyroidism 05/21/2008  . DIVERTICULOSIS, COLON 05/21/2008  . Constipation 05/21/2008  . Stenosis of rectum and anus 05/21/2008  . FIBROSITIS 05/21/2008  . 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  . 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 Diverticulosis of large intestine without perforation or abscess without bleeding 05/21/2008    Orientation RESPIRATION BLADDER Height & Weight     Self, Time, Situation, Place  O2(2L) Continent Weight: 242 lb 11.6 oz (110.1 kg) Height:  5\' 2"  (157.5 cm)  BEHAVIORAL SYMPTOMS/MOOD NEUROLOGICAL BOWEL NUTRITION STATUS      Ileostomy  Diet, TNA(soft diet, weaning TPN, see DC summary for updated diet/nutrition (may need supplements))  AMBULATORY STATUS COMMUNICATION OF NEEDS Skin   Extensive Assist Verbally Wound Vac, PU Stage and Appropriate Care(deep tissue injury)                       Personal Care Assistance Level of Assistance  Bathing, Feeding, Dressing Bathing Assistance: Maximum assistance Feeding assistance: Limited assistance Dressing Assistance: Maximum assistance     Functional Limitations Info  Sight, Hearing, Speech Sight Info: Adequate Hearing Info: Adequate Speech Info: Adequate    SPECIAL CARE FACTORS FREQUENCY  PT (By licensed PT), OT (By licensed OT)     PT Frequency: 5x OT Frequency: 5x            Contractures Contractures Info: Not present    Additional Factors Info  Code Status, Allergies Code Status Info: full code Allergies Info: Bee Venom, Epinephrine           Current Medications (08/08/2018):  This is the current hospital active medication list Current Facility-Administered Medications  Medication Dose Route Frequency Provider Last Rate Last Dose  . 0.9 %  sodium chloride infusion  250 mL Intravenous Continuous 08/10/2018 D, MD      . acetaminophen (TYLENOL) tablet 1,000 mg  1,000 mg Oral Q6H Roberto Scales D, MD   1,000 mg at 08/08/18 1414  . Chlorhexidine Gluconate Cloth 2 % PADS 6 each  6 each Topical Daily 08/10/18, MD   6 each at 08/08/18 [phone]  . feeding supplement (ENSURE ENLIVE) (ENSURE ENLIVE) liquid 237  mL  237 mL Oral TID BM Oretha Milch D, MD      . feeding supplement (PRO-STAT SUGAR FREE 64) liquid 30 mL  30 mL Oral TID Oretha Milch D, MD   30 mL at 08/08/18 1415  . hydrALAZINE (APRESOLINE) injection 20 mg  20 mg Intravenous Q4H PRN Oretha Milch D, MD   20 mg at 08/06/18 0307  . HYDROmorphone (DILAUDID) injection 0.5-1 mg  0.5-1 mg Intravenous Q2H PRN Oretha Milch D, MD   1 mg at 08/08/18 1431  . insulin aspart (novoLOG) injection 0-15  Units  0-15 Units Subcutaneous TID AC & HS Polly Cobia, RPH   2 Units at 08/08/18 1250  . iohexol (OMNIPAQUE) 300 MG/ML solution 30 mL  30 mL Oral Once PRN Maczis, Barth Kirks, PA-C      . levothyroxine (SYNTHROID) tablet 112 mcg  112 mcg Oral Q0600 Oretha Milch D, MD   112 mcg at 08/08/18 0526  . MEDLINE mouth rinse  15 mL Mouth Rinse BID Oretha Milch D, MD   15 mL at 08/08/18 0950  . nystatin (MYCOSTATIN) 100000 UNIT/ML suspension 500,000 Units  5 mL Oral QID Oretha Milch D, MD   500,000 Units at 08/08/18 1414  . nystatin (MYCOSTATIN/NYSTOP) topical powder   Topical QODAY Oretha Milch D, MD      . olopatadine (PATANOL) 0.1 % ophthalmic solution 1 drop  1 drop Both Eyes BID Oretha Milch D, MD   1 drop at 08/08/18 0950  . ondansetron (ZOFRAN) injection 4 mg  4 mg Intravenous Q6H PRN Oretha Milch D, MD      . oxyCODONE (Oxy IR/ROXICODONE) immediate release tablet 5-10 mg  5-10 mg Oral Q4H PRN Oretha Milch D, MD   10 mg at 08/08/18 1007  . pantoprazole (PROTONIX) EC tablet 40 mg  40 mg Oral QHS Oretha Milch D, MD   40 mg at 08/07/18 2105  . sodium chloride flush (NS) 0.9 % injection 10-40 mL  10-40 mL Intracatheter Q12H Oretha Milch D, MD   10 mL at 08/07/18 2111  . sodium chloride flush (NS) 0.9 % injection 10-40 mL  10-40 mL Intracatheter PRN Oretha Milch D, MD   10 mL at 08/07/18 1713  . tobramycin-dexamethasone (TOBRADEX) ophthalmic ointment 1 application  1 application Both Eyes BID Desiree Hane, MD   1 application at 96/04/54 0950  . TPN ADULT (ION)   Intravenous Continuous TPN Autumn Messing III, MD 40 mL/hr at 08/08/18 0400       Discharge Medications: Please see discharge summary for a list of discharge medications.  Relevant Imaging Results:  Relevant Lab Results:   Additional Information SS# [id number]  Nila Nephew, LCSW

## 2018-08-08 NOTE — Progress Notes (Addendum)
IV team here to draw labs but unable due to wound care dressing changes. Lab will come back at a later time

## 2018-08-08 NOTE — Progress Notes (Signed)
[facility] PROGRESS NOTE  Hailey Hughes GBT:[id number] DOB: [birth year] DOA: 07/22/2018 PCP: Christain Sacramento, MD  Assessment/Plan: 1. 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  Surgery managing JP drain, half rate TPN hopeful to discontinue pending calorie count results, continue to monitor bowel function/VAC changes, pain control 2. Foul smelling Vac output. Surgery obtained CT abd for further evaluation which shows loculated fluid collection concerning for abscess in the left hemiabdomen and small volume ascites. Monitor CBC, remains afebrile, will defer to surgical management 3. Moderate protein calorie malnutrition.  Related to abdominal surgery/poor absorption.  Prealbumin 11.5.  Continuing half rate TPN for another day to try to avoid NGT or TF, calorie count as mentioned above, tolerating soft diet, advance as tolerated per surgery recommendations. 4. Septic shock, resolved.  Likely bacterial translocation from ischemic colitis,  no longer requiring pressor support, completed Zosyn (6/9-6/13), Rocephin 6/13-6/16), meropenem 5. Acute hypoxic respiratory failure, resolved.  Intubated on 6/10, extubated on 6/15. Completed 8-day (6/16-2/23) meropenem therapy, likely multifactorial as related to aspiration pneumonia (right lower lobe consolidation on imaging) as well as severe sepsis from colitis. Encourage IS 6. Hyperkalemia,improving. Now down to 4.9 did not require lokelma for 48 hours.   Potassium removed from TPN fluid 7. AKI on CKD, Stage 3 resolved. Peak creatinine of 3. AKI from ATN.  baseline 1.4-1.6(care everywhere)  Stable at range of 1.2-1.3, normal output closely monitor, daily BMP, avoid nephrotoxins 8. Hyperphosphatemia. Unclear etiology as kidney function seems stable, repeat lab shows resolution 9. Normocytic anemia.  Iron panel consistent with anemia of chronic disease, no acute blood loss, closely monitor  CBC. 10. Type 2 diabetes.  Monitor CBG, continue sliding scale, 25 U insulin in TPN 11. Hypothyroidism continue Synthroid  12. Deconditioning, PT/OT recs SNF 13. Pressure injury of sacrum, not present on admission.  Skin intact, continue local care   Code Status: FULL Family Communication: Spoke with Kathlen Sakurai (son) at [phone] on 6/25. Updated on care of mom Disposition Plan: family wants her to be able to go to SNF once medically stable, need to optimize nutrition,   Consultants:  PCCM, General surgery  Procedures:  Exploratory  Antibiotics: completed Zosyn (6/9-6/13), Rocephin 6/13-6/16), meropenem  HPI/Subjective:  Hailey Hughes is a 78 y.o. 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  Her condition rapidly deteriorated needing transfer to ICU on day of admission for persistent hypotension and lactic acidosis with no response with aggressive IV fluid resuscitation and consistent with septic shock as she ultimately required pressor support, intubation for acute proximal respiratory failure, acute renal failure.Patient underwent exploratory laparotomy on 6/10 to progressively worsening abdominal exam concerning for peritonitis and was found to have ischemic colitis requiring abdominal colectomy, ileostomy placement and cholecystectomy.  Hospital course complicated by prolonged weaning of pressor support, concern for aspiration given hypoxia and elevated requirement (6/14), and change in mentation though CT head at that time remained negative attributed to Dilaudid.   Very little po Says she ate almost 1/2 her breakfast ( bacon and  eggs)  Objective: Vitals:   08/08/18 1308 08/08/18 1956  BP: (!) 144/64 117/61  Pulse: 84 69  Resp: 20 20  Temp: 99.1 F (37.3 C) 98.4  F (36.9 C)  SpO2: 98% 95%    Intake/Output Summary (Last 24 hours) at 08/08/2018 2321 Last data filed at 08/08/2018 1651 Gross per 24 hour  Intake 1389.8 ml  Output 1590 ml  Net -200.2 ml   Filed Weights   07/30/18 0910 07/31/18 1016 08/07/18 0551  Weight: 112.3 kg 107.2 kg 110.1 kg    Exam:   General: Obese elderly female, no distress  Cardiovascular: Regular rate and rhythm, no edema  Respiratory: Normal respiratory effort, room air  Abdomen: Soft, bowel sounds present, JP drain with slightly cloudy output, minimal output in rectal pouch, VAC dressing in place, foul smell present  Skin skin dry and intact  Neurologic slow speech, sleepy but arousable, oriented to person, place, context  Data Reviewed: Basic Metabolic Panel: Recent Labs  Lab 08/02/18 0600  08/04/18 0430 08/05/18 0632 08/06/18 0253 08/07/18 0408 08/08/18 0449  NA 148*   < > 144 142 142 141 143  K 3.6   < > 4.9 5.2* 5.2* 5.1 4.9  CL 115*   < > 114* 115* 115* 115* 115*  CO2 24   < > 21* 20* 22 22 22   GLUCOSE 198*   < > 133* 72 182* 154* 111*  BUN 92*   < > 82* 70* 66* 65* 55*  CREATININE 1.42*   < > 1.23* 1.22* 1.15* 1.28* 0.99  CALCIUM 8.5*   < > 8.6* 8.2* 8.1* 8.2* 8.3*  MG 2.2  --  2.3  --  2.1 2.1  --   PHOS 4.1  --  3.5 4.2 3.8 5.0* 3.8   < > = values in this interval not displayed.   Liver Function Tests: Recent Labs  Lab 08/04/18 0430 08/07/18 0408  AST 35 37  ALT 33 33  ALKPHOS 99 111  BILITOT 0.5 0.2*  PROT 5.4* 5.2*  ALBUMIN 1.7* 1.6*   No results for input(s): LIPASE, AMYLASE in the last 168 hours. No results for input(s): AMMONIA in the last 168 hours. CBC: Recent Labs  Lab 08/02/18 0600 08/04/18 0430 08/05/18 0830 08/08/18 1544  WBC 6.9 10.9* 11.4* 10.0  NEUTROABS  --  7.1  --   --   HGB 8.7* 8.3* 8.2* 7.7*  HCT 28.1*  27.3* 28.1* 25.8*  MCV 94.3 94.1 94.9 95.9  PLT 188 315 340 392   Cardiac Enzymes: No results for input(s): CKTOTAL, CKMB, CKMBINDEX, TROPONINI in the last 168 hours. BNP (last 3 results) No results for input(s): BNP in the last 8760 hours.  ProBNP (last 3 results) No results for input(s): PROBNP in the last 8760 hours.  CBG: Recent Labs  Lab 08/07/18 2041 08/08/18 0737 08/08/18 1132 08/08/18 1630 08/08/18 1959  GLUCAP 148* 109* 127* 84 79    No results found for this or any previous visit (from the past 240 hour(s)).   Studies: Ct Abdomen Pelvis W Contrast  Result Date: 08/08/2018 CLINICAL DATA:  Postoperative abdominal pain, foul-smelling discharge, JP drain, wound VAC EXAM: CT ABDOMEN AND PELVIS WITH CONTRAST TECHNIQUE: Multidetector CT imaging of the abdomen and pelvis was performed using the standard protocol following bolus administration of intravenous contrast. CONTRAST:  08/10/2018 OMNIPAQUE IOHEXOL 300 MG/ML SOLN, 71mL OMNIPAQUE IOHEXOL 300 MG/ML SOLN, additional oral enteric contrast COMPARISON:  07/29/2018 FINDINGS: Lower chest: Moderate left, small right pleural effusions and associated atelectasis or consolidation. Hepatobiliary: No focal liver abnormality is seen. Status post cholecystectomy. No biliary dilatation. Pancreas: Unremarkable. No pancreatic ductal dilatation or surrounding inflammatory changes. Spleen: Normal in  size without significant abnormality. Adrenals/Urinary Tract: Adrenal glands are unremarkable. Kidneys are normal, without renal calculi, solid lesion, or hydronephrosis. Bladder is unremarkable. Stomach/Bowel: Stomach is within normal limits. Status post colectomy with right lower quadrant end ostomy. Vascular/Lymphatic: Aortic atherosclerosis. No enlarged abdominal or pelvic lymph nodes. Reproductive: No mass or other significant abnormality. Other: Surgical drain is positioned about the low abdomen, tip in the right lower quadrant. Small volume loculated  appearing perisplenic ascites similar to prior (series 2, image 23). Loculated appearing air and fluid collection about the anterior left hemiabdomen adjacent to loops of small bowel measuring approximately 15.7 x 7.7 x 4.8 cm (series 2, image 50, series 6, image 58). Musculoskeletal: No acute or significant osseous findings. IMPRESSION: 1. Loculated appearing air and fluid collection about the anterior left hemiabdomen adjacent to loops of small bowel measuring approximately 15.7 x 7.7 x 4.8 cm (series 2, image 50, series 6, image 58), highly suspicious for abscess. 2. Small volume loculated appearing ascites. 3. Surgical drain in the low abdomen, status post colectomy with right lower quadrant end ostomy. 4.  Pleural effusions. Electronically Signed   By: Lauralyn Primes [name]   On: 08/08/2018 20:19    Scheduled Meds: . acetaminophen  1,000 mg Oral Q6H  . Chlorhexidine Gluconate Cloth  6 each Topical Daily  . feeding supplement (ENSURE ENLIVE)  237 mL Oral TID BM  . feeding supplement (PRO-STAT SUGAR FREE 64)  30 mL Oral TID  . insulin aspart  0-15 Units Subcutaneous TID AC & HS  . levothyroxine  112 mcg Oral Q0600  . mouth rinse  15 mL Mouth Rinse BID  . nystatin  5 mL Oral QID  . nystatin   Topical QODAY  . olopatadine  1 drop Both Eyes BID  . pantoprazole  40 mg Oral QHS  . sodium chloride (PF)      . sodium chloride flush  10-40 mL Intracatheter Q12H  . tobramycin-dexamethasone  1 application Both Eyes BID   Continuous Infusions: . sodium chloride      Principal Problem:   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 Active Problems:   Hypothyroidism   History of Hirschsprung's disease   Hypotension   Elevated lactic acid level   CKD (chronic kidney disease) stage 3, GFR 30-59 ml/min (HCC)   Diabetes (HCC)   Hyperlipidemia   Acute respiratory failure (HCC)   Sepsis (HCC)   Pressure injury of skin   Moderate protein malnutrition (HCC)    Hyperphosphatemia      Shayla D Nettey  [facility]

## 2018-08-08 NOTE — Consult Note (Signed)
[place] Nurse wound follow up Wound type: surgical  Measurement: 21cm x 7cm x 5cm  Wound bed: clean, bleeding, some slough along the left lateral wound edge  Drainage (amount, consistency, odor) yellow, milky Periwound: severe MARSI noted from 3-6 oclock, used skin barrier wipe  Dressing procedure/placement/frequency: Removed old NPWT dressing Cleaned periwound skin Periwound skin protected with skin barrier wipe  Filled dip in the skin at umbilicus with ostomy barrier ring Filled wound with __2__  pieces of black foam. Sealed NPWT dressing at 110mm HG Patient received IV pain medication per bedside nurse prior to dressing change Patient tolerated procedure well    WOC nurse will continue to provide NPWT dressing changed due to the complexity of the dressing change.    Ostomy pouch intact, changed 08/06/18.   [place], [place], [place]

## 2018-08-08 NOTE — Progress Notes (Signed)
Physical Therapy Treatment Patient Details Name: Hailey Hughes MRN: [id number] DOB: 16-Jun-1940 Today's Date: 08/08/2018    History of Present Illness 78 year old white female admitted the hospital 6/9 with CT scan evidence of colitis transferred to the ICU  for septic shock, s/p colectomy and cholecystectomy 07/23/18    PT Comments    Patient progressing slowly due to pain limiting tolerance to activity.  Currently needing +2 max A for getting to EOB and performing simple tasks seated EOB briefly prior to fatigue.  Will need SNF versus LTACH for continued skilled PT depending on length of need for wound vac, drains and rectal tube.  PT to follow acutely.   Follow Up Recommendations  SNF;LTACH     Equipment Recommendations  Other (comment)(TBA)    Recommendations for Other Services       Precautions / Restrictions Precautions Precautions: Fall Precaution Comments: multiple lines, JP drain on L, A Line and PICC RUE, abdominal wound VAC, flexiseal Restrictions Weight Bearing Restrictions: No    Mobility  Bed Mobility Overal bed mobility: Needs Assistance Bed Mobility: Rolling;Sidelying to Sit;Sit to Supine Rolling: Max assist;+2 for physical assistance Sidelying to sit: Max assist;+2 for physical assistance Supine to sit: Max assist;+2 for physical assistance Sit to supine: Total assist;+2 for physical assistance   General bed mobility comments: cues for technique, assist for rolling with bending knee and reaching with hand, side to sit +2 for legs and trunk, to supine assist for legs and trunk and to scoot to [facility]  Transfers                 General transfer comment: deferred, pt fatigued with EOB and pain in feet and knees  Ambulation/Gait                 Stairs             Wheelchair Mobility    Modified Rankin (Stroke Patients Only)       Balance Overall balance assessment: Needs assistance Sitting-balance support: Feet supported;No  upper extremity supported Sitting balance-Leahy Scale: Poor Sitting balance - Comments: leaning back on pillow with support behind due to pain in incision at abdomen; sat EOB to comb hair and perform pull ups to sit away from back support with hand support Postural control: Right lateral lean;Posterior lean                                  Cognition Arousal/Alertness: Awake/alert Behavior During Therapy: WFL for tasks assessed/performed Overall Cognitive Status: Within Functional Limits for tasks assessed                                        Exercises General Exercises - Upper Extremity Shoulder Flexion: AAROM;5 reps;Supine;Both General Exercises - Lower Extremity Ankle Circles/Pumps: 10 reps;Both;Supine Heel Slides: AAROM;5 reps;Both;Supine    General Comments        Pertinent Vitals/Pain Pain Assessment: Faces Faces Pain Scale: Hurts even more Pain Location: feet, knees, abdomen, shoulders and back Pain Descriptors / Indicators: Sharp;Aching;Sore Pain Intervention(s): Monitored during session;Repositioned;Limited activity within patient's tolerance    Home Living                      Prior Function            PT Goals (current goals  can now be found in the care plan section) Progress towards PT goals: Progressing toward goals(slow)    Frequency    Min 2X/week      PT Plan Current plan remains appropriate;Discharge plan needs to be updated    Co-evaluation              AM-PAC PT "6 Clicks" Mobility   Outcome Measure  Help needed turning from your back to your side while in a flat bed without using bedrails?: Total Help needed moving from lying on your back to sitting on the side of a flat bed without using bedrails?: Total Help needed moving to and from a bed to a chair (including a wheelchair)?: Total Help needed standing up from a chair using your arms (e.g., wheelchair or bedside chair)?: Total Help needed  to walk in hospital room?: Total Help needed climbing 3-5 steps with a railing? : Total 6 Click Score: 6    End of Session   Activity Tolerance: Patient limited by fatigue;Patient limited by pain Patient left: in bed;with call bell/phone within reach;with bed alarm set   PT Visit Diagnosis: Unsteadiness on feet (R26.81);Difficulty in walking, not elsewhere classified (R26.2);Muscle weakness (generalized) (M62.81);Pain     Time: 1100-1130 PT Time Calculation (min) (ACUTE ONLY): 30 min  Charges:  $Therapeutic Exercise: 8-22 mins $Therapeutic Activity: 8-22 mins                     Magda Kiel, [place] Acute Rehabilitation Services [phone] 08/08/2018    Reginia Naas 08/08/2018, 1:39 PM

## 2018-08-08 NOTE — Progress Notes (Signed)
  Speech Language Pathology Treatment: Dysphagia  Patient Details Name: Hailey Hughes MRN: [id number] DOB: May 02, 1940 Today's Date: 08/08/2018 Time: 1010-1035 SLP Time Calculation (min) (ACUTE ONLY): 25 min  Assessment / Plan / Recommendation Clinical Impression  Pt today with NO indications of airway compromise with po intake including medication with water - which is difficult to coordinate!  She is able to self feed now and SLP advised she needs to do this consistently to maximize her airway protection.  Recommend advance diet as surgery indicates.  SLP educated pt to importance of oral care using teach back. Provided her with copy of swallow precaution signs.  No SLP follow up indicated as pt has made significant improvement with swallowing consistent with medical improvement. Thanks for allowing me to help care for this pt.  HPI HPI: Patient is an 78 y.o. female with PMH: HTN, HLD, prior abdominal surgery due to megacolon when 78 years old, hypothyroidism, DM-2, RA, OSA, reflux esophagitis, who presented to [facility] due to abdominal pain, nausea and vomitting of one-day duration. She was admitted under primary diagnosis of sepsis secondary to generalized colitis, also with UTI. She underwent a colectomy and cholecystectomy on 07/23/18 and was intubated from 6/10-6/15.      SLP Plan  All goals met       Recommendations  Diet recommendations: (advance as surgery indicates) Liquids provided via: Straw;Teaspoon;Cup Medication Administration: Other (Comment) Supervision: Staff to assist with self feeding Compensations: Minimize environmental distractions;Slow rate;Small sips/bites Postural Changes and/or Swallow Maneuvers: Upright 30-60 min after meal;Seated upright 90 degrees                Oral Care Recommendations: Oral care BID Follow up Recommendations: Skilled Nursing facility SLP Visit Diagnosis: Dysphagia, unspecified (R13.10) Plan: All goals met       GO                 Hailey Hughes 08/08/2018, 11:32 AM   Hailey Salk, MS [facility] SLP Acute Rehab Services Pager [phone] Office [phone]

## 2018-08-08 NOTE — Progress Notes (Signed)
16 Days Post-Op  Subjective: CC: Patient reports she is still having some pain. Trying to eat more. Still on half TNA  Objective: Vital signs in last 24 hours: Temp:  [97.7 F (36.5 C)-98.8 F (37.1 C)] 98.8 F (37.1 C) (06/26 0339) Pulse Rate:  [79-81] 79 (06/26 0339) Resp:  [16-21] 18 (06/26 0400) BP: (124-148)/(47-69) 148/69 (06/26 0339) SpO2:  [98 %-99 %] 98 % (06/26 0339) Last BM Date: 08/07/18  Intake/Output from previous day: 06/25 0701 - 06/26 0700 In: 499.8 [P.O.:60; I.V.:439.8] Out: 1405 [Urine:1150; Drains:55; Stool:200] Intake/Output this shift: Total I/O In: 240 [P.O.:240] Out: -   PE: Gen: Awake and alert, on O2 Abd: soft, left sided abdominal tenderness without r/r/g. +BS. JP drown with foul smelling milky brown output. 55cc/24 hours. Vac in place (to be changed today). Still some redness around vac. Being treated for fungal infection. Vac output appears similar to JP output, brown but thinner. Colostomy bag with dark brown stool in bag.   Lab Results:  No results for input(s): WBC, HGB, HCT, PLT in the last 72 hours. BMET Recent Labs    08/07/18 0408 08/08/18 0449  NA 141 143  K 5.1 4.9  CL 115* 115*  CO2 22 22  GLUCOSE 154* 111*  BUN 65* 55*  CREATININE 1.28* 0.99  CALCIUM 8.2* 8.3*   PT/INR No results for input(s): LABPROT, INR in the last 72 hours. CMP     Component Value Date/Time   NA 143 08/08/2018 0449   K 4.9 08/08/2018 0449   CL 115 (H) 08/08/2018 0449   CO2 22 08/08/2018 0449   GLUCOSE 111 (H) 08/08/2018 0449   BUN 55 (H) 08/08/2018 0449   CREATININE 0.99 08/08/2018 0449   CALCIUM 8.3 (L) 08/08/2018 0449   PROT 5.2 (L) 08/07/2018 0408   ALBUMIN 1.6 (L) 08/07/2018 0408   AST 37 08/07/2018 0408   ALT 33 08/07/2018 0408   ALKPHOS 111 08/07/2018 0408   BILITOT 0.2 (L) 08/07/2018 0408   GFRNONAA 54 (L) 08/08/2018 0449   GFRAA >60 08/08/2018 0449   Lipase     Component Value Date/Time   LIPASE 12 07/25/2018 0500       Studies/Results: No results found.  Anti-infectives: Anti-infectives (From admission, onward)   Start     Dose/Rate Route Frequency Ordered Stop   08/06/18 1400  fluconazole (DIFLUCAN) tablet 150 mg     150 mg Oral Daily 08/06/18 1205 08/08/18 0941   07/29/18 0600  meropenem (MERREM) 1 g in sodium chloride 0.9 % 100 mL IVPB  Status:  Discontinued     1 g 200 mL/hr over 30 Minutes Intravenous Every 12 hours 07/29/18 0457 08/03/18 1048   07/29/18 0500  ceFEPIme (MAXIPIME) 2 g in sodium chloride 0.9 % 100 mL IVPB  Status:  Discontinued     2 g 200 mL/hr over 30 Minutes Intravenous Every 24 hours 07/29/18 0436 07/29/18 0457   07/26/18 1400  piperacillin-tazobactam (ZOSYN) IVPB 3.375 g  Status:  Discontinued     3.375 g 12.5 mL/hr over 240 Minutes Intravenous Every 6 hours 07/26/18 0906 07/26/18 1339   07/26/18 1400  cefTRIAXone (ROCEPHIN) 1 g in sodium chloride 0.9 % 100 mL IVPB  Status:  Discontinued     1 g 200 mL/hr over 30 Minutes Intravenous Every 24 hours 07/26/18 1339 07/29/18 0436   07/23/18 0200  piperacillin-tazobactam (ZOSYN) IVPB 2.25 g  Status:  Discontinued     2.25 g 100 mL/hr over 30 Minutes  Intravenous Every 6 hours 07/22/18 2248 07/26/18 0906   07/22/18 1800  piperacillin-tazobactam (ZOSYN) IVPB 3.375 g  Status:  Discontinued     3.375 g 12.5 mL/hr over 240 Minutes Intravenous Every 8 hours 07/22/18 0953 07/22/18 2248   07/22/18 1000  piperacillin-tazobactam (ZOSYN) IVPB 3.375 g     3.375 g 100 mL/hr over 30 Minutes Intravenous  Once 07/22/18 0953 07/22/18 1239   07/22/18 0745  ciprofloxacin (CIPRO) IVPB 400 mg     400 mg 200 mL/hr over 60 Minutes Intravenous  Once 07/22/18 0739 07/22/18 0921   07/22/18 0745  metroNIDAZOLE (FLAGYL) IVPB 500 mg     500 mg 100 mL/hr over 60 Minutes Intravenous  Once 07/22/18 0739 07/22/18 1024       Assessment/Plan Type 2 diabetes Hypothyroid Rheumatoid arthritis Struct of sleep apnea Morbid obesity BMI 36.58 AKI - Cr  trending down 0.99 Malnutrition - prealbumin11.5 Klebsiella UTI, resolved VDRF,resolved Thrombocytopenia -resolved  Septic shock Ischemic colitis, acute cholecystitis POD16,S/pExploratory laparotomy, total abdominal colectomy, [name], cholecystectomy-6/10-Dr. Dalbert Batman - CTAP 6/16 w/out intra-abdominal cause of fever and elevated WBC. There is noted RLL PNA, completed merrem treatment. - - Still having abdominal pain. JP drain and Vac output looks very foul. Still having left sided tenderness. Will check CBC and CTAP.  - Cont VAC changes. Currently being treated with Diflucan.  - Having decent bowel function currently. -Onsoft diet, continue TNA at half rate and calorie count. If calorie count looks good then we can wean TNA to off. Will make NPO for CTAP for now.  -prealbumin 11.5 -cont surgical JP drain at this time due to slightly brown appearing output. It is possible that she decompressed some fluid out of her rectum yesterday that was residual vs a possible leak at her rectal stump. Her abdominal exam is benign and if she does have a possible leak it appears well controlled currently with her drain. No indications at this time for abx or any further work up currently. If she were to develop new fevers, worsening abdominal pain, etc, then she may need further imaging.   ID -Meropenem completed, none currently FEN -IVF,TNA @ half rate, NPO for CT VTE -SCDs Follow up -Dr. Adline Peals - son Shanon Brow [phone] 6/23), husband Hoy Morn [phone]   LOS: 17 days    Jillyn Ledger , [facility] 08/08/2018, 11:38 AM Pager: [phone]

## 2018-08-09 ENCOUNTER — Encounter (HOSPITAL_COMMUNITY): Payer: Self-pay | Admitting: Radiology

## 2018-08-09 ENCOUNTER — Inpatient Hospital Stay (HOSPITAL_COMMUNITY): Payer: Medicare Other

## 2018-08-09 LAB — CBC WITH DIFFERENTIAL/PLATELET
Abs Immature Granulocytes: 0.22 10*3/uL — ABNORMAL HIGH (ref 0.00–0.07)
Basophils Absolute: 0.1 10*3/uL (ref 0.0–0.1)
Basophils Relative: 1 %
Eosinophils Absolute: 0.2 10*3/uL (ref 0.0–0.5)
Eosinophils Relative: 2 %
HCT: 25.4 % — ABNORMAL LOW (ref 36.0–46.0)
Hemoglobin: 7.7 g/dL — ABNORMAL LOW (ref 12.0–15.0)
Immature Granulocytes: 2 %
Lymphocytes Relative: 9 %
Lymphs Abs: 1 10*3/uL (ref 0.7–4.0)
MCH: 28.4 pg (ref 26.0–34.0)
MCHC: 30.3 g/dL (ref 30.0–36.0)
MCV: 93.7 fL (ref 80.0–100.0)
Monocytes Absolute: 1.4 10*3/uL — ABNORMAL HIGH (ref 0.1–1.0)
Monocytes Relative: 12 %
Neutro Abs: 8.6 10*3/uL — ABNORMAL HIGH (ref 1.7–7.7)
Neutrophils Relative %: 74 %
Platelets: 404 10*3/uL — ABNORMAL HIGH (ref 150–400)
RBC: 2.71 MIL/uL — ABNORMAL LOW (ref 3.87–5.11)
RDW: 15.2 % (ref 11.5–15.5)
WBC: 11.4 10*3/uL — ABNORMAL HIGH (ref 4.0–10.5)
nRBC: 0 % (ref 0.0–0.2)

## 2018-08-09 LAB — GLUCOSE, CAPILLARY
Glucose-Capillary: 82 mg/dL (ref 70–99)
Glucose-Capillary: 86 mg/dL (ref 70–99)
Glucose-Capillary: 89 mg/dL (ref 70–99)
Glucose-Capillary: 105 mg/dL — ABNORMAL HIGH (ref 70–99)

## 2018-08-09 LAB — BASIC METABOLIC PANEL
Anion gap: 16 — ABNORMAL HIGH (ref 5–15)
BUN: 47 mg/dL — ABNORMAL HIGH (ref 8–23)
CO2: 19 mmol/L — ABNORMAL LOW (ref 22–32)
Calcium: 8.8 mg/dL — ABNORMAL LOW (ref 8.9–10.3)
Chloride: 107 mmol/L (ref 98–111)
Creatinine, Ser: 1 mg/dL (ref 0.44–1.00)
GFR calc Af Amer: >60 mL/min (ref >60)
GFR calc non Af Amer: 53 mL/min — ABNORMAL LOW (ref >60)
Glucose, Bld: 91 mg/dL (ref 70–99)
Potassium: 5 mmol/L (ref 3.5–5.1)
Sodium: 142 mmol/L (ref 135–145)

## 2018-08-09 MED ORDER — MIDAZOLAM HCL 2 MG/2ML IJ SOLN
INTRAMUSCULAR | Status: AC
  Filled 2018-08-09: qty 6

## 2018-08-09 MED ORDER — PIPERACILLIN-TAZOBACTAM 3.375 G IVPB
3.3750 g | Freq: Three times a day (TID) | INTRAVENOUS | Status: DC
  Administered 2018-08-09 – 2018-08-12 (×10): 3.375 g via INTRAVENOUS
  Filled 2018-08-09 (×11): qty 50

## 2018-08-09 MED ORDER — MIDAZOLAM HCL 2 MG/2ML IJ SOLN
INTRAMUSCULAR | Status: AC | PRN
  Administered 2018-08-09: 1 mg via INTRAVENOUS

## 2018-08-09 MED ORDER — SODIUM CHLORIDE 0.9% FLUSH
5.0000 mL | Freq: Three times a day (TID) | INTRAVENOUS | Status: DC
  Administered 2018-08-09 – 2018-08-20 (×33): 5 mL

## 2018-08-09 MED ORDER — FENTANYL CITRATE (PF) 100 MCG/2ML IJ SOLN
INTRAMUSCULAR | Status: AC
  Filled 2018-08-09: qty 4

## 2018-08-09 MED ORDER — FENTANYL CITRATE (PF) 100 MCG/2ML IJ SOLN
INTRAMUSCULAR | Status: AC | PRN
  Administered 2018-08-09: 50 ug via INTRAVENOUS

## 2018-08-09 NOTE — Progress Notes (Signed)
PROGRESS NOTE  Hailey Hughes WNU:[id number] DOB: 06/26/1940 DOA: 07/22/2018 PCP: Barbie Banner, MD  HPI/Recap of past 24 hours: Hailey Hughes is a 78 y.o. 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  Her condition rapidly deteriorated needing transfer to ICU on day of admission for persistent hypotension and lactic acidosis with no response with aggressive IV fluid resuscitation and consistent with septic shock as she ultimately required pressor support, intubation for acute proximal respiratory failure, acute renal failure.Patient underwent exploratory laparotomy on 6/10 to progressively worsening abdominal exam concerning for peritonitis and was found to have ischemic colitis requiring abdominal colectomy, ileostomy placement and cholecystectomy.    Hospital course complicated by prolonged weaning of pressor support, concern for aspiration given hypoxia and elevated requirement (6/14), and change in mentation though CT head at that time remained negative attributed to Dilaudid.  Most recently found to have fluid collection in her abdomen.  Underwent aspiration and drain placement by interventional radiology on 6/27  Patient seen prior to aspiration and drain placement.  Doing okay, complains of some bilateral ankle pain, left greater than right, was worried about gout  Assessment/Plan: Principal Problem:   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 Active Problems:   Hypothyroidism   History of Hirschsprung's disease   Hypotension   Elevated lactic acid level   CKD (chronic kidney disease) stage 3, GFR 30-59 ml/min (HCC)   Diabetes (HCC)  Hyperlipidemia   Acute respiratory failure (HCC)   Sepsis (HCC)   Pressure injury of skin   Moderate protein malnutrition (HCC)   Hyperphosphatemia   Abdominal fluid collection 1. 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  Surgery managing JP drain, half rate TPN hopeful to discontinue pending calorie count results, continue to monitor bowel function/VAC changes, pain control 2. Foul smelling Vac output. Surgery obtained CT abd for further evaluation which shows loculated fluid collection concerning for abscess in the left hemiabdomen and small volume ascites.  Underwent aspiration and drain placement by interventional radiology on 6/27 will defer to surgical management 3. Moderate protein calorie malnutrition in patient with morbid obesity.  Related to abdominal surgery/poor absorption.  Prealbumin 11.5.  Continuing half rate TPN for another day to try to avoid NGT or TF, calorie count as mentioned above, tolerating soft diet, advance as tolerated per surgery recommendations.  Meets criteria for morbid obesity with BMI greater than 40 4. Septic shock, resolved.  Likely bacterial translocation from ischemic colitis,  no longer requiring pressor support, completed Zosyn (6/9-6/13), Rocephin 6/13-6/16), meropenem 5. Acute hypoxic respiratory failure, resolved.  Intubated on 6/10, extubated on 6/15. Completed 8-day (6/16-2/23) meropenem therapy, likely multifactorial as related to aspiration pneumonia (right lower lobe consolidation on imaging) as well as severe sepsis from colitis. Encourage IS 6. Hyperkalemia,improving. Now down to 4.9 did not require lokelma for 48 hours.   Potassium removed from TPN fluid 7. AKI on CKD, Stage 3 resolved. Peak creatinine of 3. AKI from ATN.  baseline 1.4-1.6(care everywhere)   back to baseline.  Avoiding nephrotoxins hyperphosphatemia. Unclear etiology as kidney function seems stable, repeat lab shows  resolution 8. Normocytic anemia.  Iron panel consistent with anemia of chronic disease, no acute blood loss, closely monitor  CBC.  Staying between 7 and 8 9. Type 2 diabetes.  Monitor CBG, continue sliding scale, 25 U insulin in TPN 10. Hypothyroidism continue Synthroid  11. Deconditioning, PT/OT recs SNF 12. Pressure injury of sacrum, not present on admission.  Skin intact, continue local care 13. Bilateral ankle pain: Somewhat tender although does not appear to be gout.  More tender on malleoli areas were legs are diffusely swollen from third spacing.  Checking uric acid level  Code Status: Full code  Family Communication: Updated son by phone  Disposition Plan: Eventual discharge to skilled nursing, once able to wean off of TPN, infection confirmed fully resolved   Consultants:  Critical care  General surgery  Interventional radiology  Procedures:  Exploratory laparotomy  Aspiration of residual fluid plus drain placement 6/27  Antimicrobials:  IV Zosyn 6/9-6/13  IV Rocephin 6/13-6/16  IV meropenem  DVT prophylaxis: SCDs   Objective: Vitals:   08/09/18 1449 08/09/18 1521  BP: (!) 171/70 (!) 160/57  Pulse: 94 83  Resp: (!) 24 20  Temp:  98.5 F (36.9 C)  SpO2: 98% 95%    Intake/Output Summary (Last 24 hours) at 08/09/2018 1806 Last data filed at 08/09/2018 1500 Gross per 24 hour  Intake 50 ml  Output 1525 ml  Net -1475 ml   Filed Weights   07/30/18 0910 07/31/18 1016 08/07/18 0551  Weight: 112.3 kg 107.2 kg 110.1 kg   Body mass index is 44.4 kg/m.  Exam:   General: Alert and oriented x3, no acute distress  HEENT: Normocephalic and atraumatic, mucous membranes are moist  Neck: Supple, no JVD  Cardiovascular: Regular rate and rhythm, S1-S2  Respiratory: Clear to auscultation bilaterally  Abdomen: Soft, mild distention, tender in lower quadrants, hypoactive bowel sounds  Musculoskeletal: No clubbing or cyanosis, 1+ pitting edema, tenderness  at bilateral internal malleoli areas  Skin: No skin lesions  Psychiatry: Appropriate, no evidence of psychoses  Neuro: No focal deficits   Data Reviewed: CBC: Recent Labs  Lab 08/04/18 0430 08/05/18 0830 08/08/18 1544 08/09/18 0455  WBC 10.9* 11.4* 10.0 11.4*  NEUTROABS 7.1  --   --  8.6*  HGB 8.3* 8.2* 7.7* 7.7*  HCT 27.3* 28.1* 25.8* 25.4*  MCV 94.1 94.9 95.9 93.7  PLT 315 340 392 010*   Basic Metabolic Panel: Recent Labs  Lab 08/04/18 0430 08/05/18 0632 08/06/18 0253 08/07/18 0408 08/08/18 0449 08/09/18 0455  NA 144 142 142 141 143 142  K 4.9 5.2* 5.2* 5.1 4.9 5.0  CL 114* 115* 115* 115* 115* 107  CO2 21* 20* 22 22 22  19*  GLUCOSE 133* 72 182* 154* 111* 91  BUN 82* 70* 66* 65* 55* 47*  CREATININE 1.23* 1.22* 1.15* 1.28* 0.99 1.00  CALCIUM 8.6* 8.2* 8.1* 8.2* 8.3* 8.8*  MG 2.3  --  2.1 2.1  --   --   PHOS 3.5 4.2 3.8 5.0* 3.8  --    GFR: Estimated Creatinine Clearance: 52.5 mL/min (by C-G formula based on SCr of 1 mg/dL). Liver Function Tests: Recent Labs  Lab 08/04/18 0430 08/07/18 0408  AST 35 37  ALT 33 33  ALKPHOS 99 111  BILITOT 0.5 0.2*  PROT 5.4* 5.2*  ALBUMIN 1.7* 1.6*   No results for input(s): LIPASE, AMYLASE in the last 168 hours. No results for input(s): AMMONIA in the last 168 hours. Coagulation Profile: No results for input(s): INR, PROTIME in the last 168 hours. Cardiac Enzymes: No results for input(s): CKTOTAL, CKMB, CKMBINDEX, TROPONINI in the  last 168 hours. BNP (last 3 results) No results for input(s): PROBNP in the last 8760 hours. HbA1C: No results for input(s): HGBA1C in the last 72 hours. CBG: Recent Labs  Lab 08/08/18 1959 08/08/18 2356 08/09/18 0755 08/09/18 1135 08/09/18 1546  GLUCAP 79 87 82 86 89   Lipid Profile: No results for input(s): CHOL, HDL, LDLCALC, TRIG, CHOLHDL, LDLDIRECT in the last 72 hours. Thyroid Function Tests: No results for input(s): TSH, T4TOTAL, FREET4, T3FREE, THYROIDAB in the last 72  hours. Anemia Panel: No results for input(s): VITAMINB12, FOLATE, FERRITIN, TIBC, IRON, RETICCTPCT in the last 72 hours. Urine analysis:    Component Value Date/Time   COLORURINE AMBER (A) 07/22/2018 0247   APPEARANCEUR CLOUDY (A) 07/22/2018 0247   LABSPEC 1.013 07/22/2018 0247   PHURINE 5.0 07/22/2018 0247   GLUCOSEU NEGATIVE 07/22/2018 0247   HGBUR SMALL (A) 07/22/2018 0247   BILIRUBINUR NEGATIVE 07/22/2018 0247   KETONESUR 5 (A) 07/22/2018 0247   PROTEINUR 100 (A) 07/22/2018 0247   NITRITE NEGATIVE 07/22/2018 0247   LEUKOCYTESUR SMALL (A) 07/22/2018 0247   Sepsis Labs: @LABRCNTIP (procalcitonin:4,lacticidven:4)  )No results found for this or any previous visit (from the past 240 hour(s)).    Studies: Ct Abdomen Pelvis W Contrast  Result Date: 08/08/2018 CLINICAL DATA:  Postoperative abdominal pain, foul-smelling discharge, JP drain, wound VAC EXAM: CT ABDOMEN AND PELVIS WITH CONTRAST TECHNIQUE: Multidetector CT imaging of the abdomen and pelvis was performed using the standard protocol following bolus administration of intravenous contrast. CONTRAST:  08/10/2018 OMNIPAQUE IOHEXOL 300 MG/ML SOLN, 43mL OMNIPAQUE IOHEXOL 300 MG/ML SOLN, additional oral enteric contrast COMPARISON:  07/29/2018 FINDINGS: Lower chest: Moderate left, small right pleural effusions and associated atelectasis or consolidation. Hepatobiliary: No focal liver abnormality is seen. Status post cholecystectomy. No biliary dilatation. Pancreas: Unremarkable. No pancreatic ductal dilatation or surrounding inflammatory changes. Spleen: Normal in size without significant abnormality. Adrenals/Urinary Tract: Adrenal glands are unremarkable. Kidneys are normal, without renal calculi, solid lesion, or hydronephrosis. Bladder is unremarkable. Stomach/Bowel: Stomach is within normal limits. Status post colectomy with right lower quadrant end ostomy. Vascular/Lymphatic: Aortic atherosclerosis. No enlarged abdominal or pelvic lymph  nodes. Reproductive: No mass or other significant abnormality. Other: Surgical drain is positioned about the low abdomen, tip in the right lower quadrant. Small volume loculated appearing perisplenic ascites similar to prior (series 2, image 23). Loculated appearing air and fluid collection about the anterior left hemiabdomen adjacent to loops of small bowel measuring approximately 15.7 x 7.7 x 4.8 cm (series 2, image 50, series 6, image 58). Musculoskeletal: No acute or significant osseous findings. IMPRESSION: 1. Loculated appearing air and fluid collection about the anterior left hemiabdomen adjacent to loops of small bowel measuring approximately 15.7 x 7.7 x 4.8 cm (series 2, image 50, series 6, image 58), highly suspicious for abscess. 2. Small volume loculated appearing ascites. 3. Surgical drain in the low abdomen, status post colectomy with right lower quadrant end ostomy. 4.  Pleural effusions. Electronically Signed   By: 07/31/2018 [name]   On: 08/08/2018 20:19    Scheduled Meds:  acetaminophen  1,000 mg Oral Q6H   Chlorhexidine Gluconate Cloth  6 each Topical Daily   feeding supplement (ENSURE ENLIVE)  237 mL Oral TID BM   feeding supplement (PRO-STAT SUGAR FREE 64)  30 mL Oral TID   insulin aspart  0-15 Units Subcutaneous TID AC & HS   levothyroxine  112 mcg Oral Q0600   mouth rinse  15 mL Mouth Rinse BID  nystatin  5 mL Oral QID   nystatin   Topical QODAY   olopatadine  1 drop Both Eyes BID   pantoprazole  40 mg Oral QHS   sodium chloride flush  10-40 mL Intracatheter Q12H   sodium chloride flush  5 mL Intracatheter Q8H   tobramycin-dexamethasone  1 application Both Eyes BID    Continuous Infusions:  sodium chloride     piperacillin-tazobactam (ZOSYN)  IV 3.375 g (08/09/18 1649)     LOS: 18 days     Hollice EspySendil K Haleem Hanner, MD [facility]  To reach me or the doctor on call, go to: [email or web address] Password [email or web address]  08/09/2018, 6:06 PM

## 2018-08-09 NOTE — Progress Notes (Signed)
Pharmacy Antibiotic Note  Hailey Hughes is a 78 y.o. female hospitalized since 07/22/2018 after presenting with abdominal pain and found to have ischemic colitis, acute cholecystitis requiring total abdominal colectomy, [name], and cholecystectomy on 07/23/18.  She received prolonged antibiotic therapy during this admission for treatment of the original intra-abdominal infection. CT from 08/08/2018 showed intra-abdominal abscess.  Orders received from surgery to resume antibiotic therapy using Zosyn with pharmacy dosing assistance.    Plan: Zosyn 3.375 grams IV q8h, each dose infused over 4 hours Follow clinical course, renal function, culture results   Height: 5\' 2"  (157.5 cm) Weight: 242 lb 11.6 oz (110.1 kg) IBW/kg (Calculated) : 50.1  Temp (24hrs), Avg:98.7 F (37.1 C), Min:98.4 F (36.9 C), Max:99.1 F (37.3 C)  Recent Labs  Lab 08/04/18 0430 08/05/18 0632 08/05/18 0830 08/06/18 0253 08/07/18 0408 08/08/18 0449 08/08/18 1544 08/09/18 0455  WBC 10.9*  --  11.4*  --   --   --  10.0 11.4*  CREATININE 1.23* 1.22*  --  1.15* 1.28* 0.99  --  1.00    Estimated Creatinine Clearance: 52.5 mL/min (by C-G formula based on SCr of 1 mg/dL).    Allergies  Allergen Reactions  . Bee Venom Anaphylaxis  . Epinephrine Other (See Comments)    REACTION: Nervousness    Antimicrobials this admission:  6/9 Cipro/Flagyl x1 6/9 Zosyn >> 6/13 6/13 Rocephin >>6/16 6/16 Merrem>6/21 6/27 Zosyn >>  Dose adjustments this admission:  6/9: Zosyn 3.375 g q8h EI reduced to 2.25g q6 for renal function 6/13: Zosyn changed back to 3.375 g q8h EI given improvement in renal function.  Microbiology results:  6/9 BCx x1set: NGF 6/9 UCx: >100k Kleb pneumo - resistant to ampicillin and intermediate to nitrofurantoin 6/9 Covid: neg  6/9 MRSA PCR: neg 6/10 abdomen: NGF 6/16 BCx: NGF  Thank you for allowing pharmacy to be a part of this patient's care.  Clayburn Pert, PharmD,  BCPS [phone] 08/09/2018  9:16 AM

## 2018-08-09 NOTE — Procedures (Signed)
Interventional Radiology Procedure Note  Procedure: CT guided left abd abscess drain.  29F drain. To bulb. ~200cc of foul gray fluid aspirated  .  Complications: None  Recommendations:  - TO bulb suction.  Routine drain care - Do not submerge - Routine wound care   Signed,  Dulcy Fanny. Earleen Newport, DO

## 2018-08-09 NOTE — Consult Note (Signed)
Chief Complaint: Patient was seen in consultation today for abdominal abscess drain placement.  Referring Physician(s): Dr. Phylliss Blakes  Supervising Physician: Gilmer Mor  Patient Status: [facility] - In-pt  History of Present Illness: Hailey Hughes is a 78 y.o. female with a past medical history significant for depression, RA, hypothyroidism, HLD, HTN, DM, constipation, imperforate anus, previous colonic anastomosis d/t megacolon and diverticulosis who presented to [facility] ED on 07/22/18 with complaints of abdominal pain, constipation, nausea and vomiting. Upon presentation she was found to be hypotensive with elevated creatinine, elevated lactic acid and leukocytosis. Imaging showed mildly distended colon with fluid and fecal material with very mild pericolonic changes possibly representing generalized colitis. She was admitted for further evaluation and management. She was admitted to the ICU later that same day due to hypotension requiring pressor support and ultimately required intubation overnight due to a persistent decrease in oxygen saturation as well as mixed metabolic and respiratory acidosis. General surgery was consulted and she was emergently taken to the OR on 07/23/18 where she underwent an exploratory laparotomy, total abdominal colectomy, [name] and cholecystectomy for ischemic colitis. She had a prolonged hospital course complicated by slow weaning of pressor support, fever, leukocytosis and PNA. A repeat CT abd/pelvis with contrast was obtained yesterday due to post-op abdominal pain and foul-smelling discharge from JP drain and wound vac which showed a loculated appearing air and fluid collection about the anterior left hemiabdomen adjacent to loops of small bowel measuring approximately 15.7 x 7.7 x 4.8 cm highly suspicious for abscess. IR has been consulted for abdominal abscess aspiration/drain placement.  Patient sleeping upon entry to room, arouses to light  touch but now verbal cues. Somnolent on exam but does appear to understand procedure indications and risk. 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 She states she is having abdominal pain consistently and "just feels bad." She was hoping to not have another drain placed but understands the need for one.   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   Qualifier: Diagnosis of  By: Nelson-Smith CMA (AAMA), Dottie     HTN (hypertension)    Hyperlipidemia    Hypothyroidism    Imperforate anus    Obesity    OSA (obstructive sleep apnea)    Palpitations    Reflux esophagitis     Past Surgical History:  Procedure Laterality Date   APPENDECTOMY     CARPAL TUNNEL RELEASE Right    COLON RESECTION  1958   age 89   COLOSTOMY  21   age 16 due to imperforate anus   CYSTECTOMY     spinal   LAPAROTOMY N/A 07/23/2018   Procedure: TOTAL ABDOMINAL COLECTOMY, [name], CHOLECYSTECTOMY;  Surgeon: Claud Kelp, MD;  Location: [facility] ORS;  Service: General;  Laterality: N/A;   RECTOVAGINAL FISTULA CLOSURE     congenital    Allergies: Bee venom and Epinephrine  Medications: Prior to Admission medications   Medication Sig Start Date End Date Taking? Authorizing Provider  amLODipine-benazepril (LOTREL) 5-20 MG per capsule Take 1 capsule by mouth every evening.    Yes [provider]  aspirin 81 MG tablet Take 81 mg by mouth every evening.    Yes [provider]  atorvastatin (LIPITOR) 20 MG tablet Take 20 mg by mouth every evening.    Yes [provider]  bumetanide (BUMEX) 2 MG tablet Take 2 mg by mouth every evening.    Yes [provider]  celecoxib (CELEBREX) 200 MG capsule Take 200 mg by mouth every evening.     Yes [provider]  febuxostat (ULORIC) 40 MG tablet Take 40 mg by mouth every evening. 12/26/17  Yes [provider]  fexofenadine (ALLEGRA) 180 MG tablet Take 180 mg by mouth daily as needed for allergies.    Yes [provider]  fluticasone (FLONASE) 50 MCG/ACT nasal spray Place 2 sprays into both nostrils daily as needed for allergies.  12/30/17  Yes [provider]  HYDROcodone-acetaminophen (NORCO) 7.5-325 MG tablet Take 1 tablet by mouth 3 (three) times daily as needed. 02/18/18  Yes [provider]  levothyroxine (SYNTHROID, LEVOTHROID) 112 MCG tablet Take 112 mcg by mouth daily.   Yes [provider]  metFORMIN (GLUCOPHAGE-XR) 500 MG 24 hr tablet Take 1,000 mg by mouth every evening.  06/26/17  Yes [provider]  metoprolol succinate (TOPROL-XL) 50 MG 24 hr tablet Take 50 mg by mouth daily. 12/30/17  Yes [provider]  montelukast (SINGULAIR) 10 MG tablet Take 10 mg by mouth daily as needed (allergies).    Yes [provider]  olopatadine (PATANOL) 0.1 % ophthalmic solution Place 1 drop into both eyes daily as needed for allergies.  12/30/17  Yes [provider]  pantoprazole (PROTONIX) 40 MG tablet Take 1 tablet (40 mg total) by mouth daily. 02/09/14  Yes Hart CarwinBrodie, Dora M, MD  senna (SENOKOT) 8.6 MG tablet Take 1 tablet by mouth daily as needed for constipation.   Yes [provider]  tiZANidine (ZANAFLEX) 4 MG capsule Take 4 mg by mouth 2 (two) times daily as needed for muscle spasms.    Yes [provider]  tobramycin-dexamethasone (TOBRADEX) ophthalmic ointment Place 1 application into both eyes 2 (two) times daily.   Yes [provider]  triamcinolone cream (KENALOG) 0.1 % Apply 1 application topically daily as needed (ezcema).  05/29/17  Yes [provider]     Family History  Problem Relation Age of Onset   Heart attack Father    Hypertension Father     Hypertension Mother    Diabetes Sister    Colon cancer Maternal Uncle    Heart disease Brother    Colon polyps Sister    Esophageal cancer Neg Hx    Kidney disease Neg Hx     Social History   Socioeconomic History   Marital status: Married    Spouse name: Not on file   Number of children: 1   Years of education: Not on file   Highest education level: Not on file  Occupational History   Occupation: Retired    Associate Professormployer: RETIRED  Ecologistocial Needs   Financial resource strain: Not on file   Food insecurity    Worry: Not on file    Inability: Not on file   Transportation needs    Medical: Not on file    Non-medical: Not on file  Tobacco Use   Smoking status: Never Smoker   Smokeless tobacco: Never Used  Substance and Sexual Activity   Alcohol use: No   Drug use: No   Sexual activity: Not on file  Lifestyle   Physical activity    Days per week: Not on file    Minutes per session:  Not on file   Stress: Not on file  Relationships   Social connections    Talks on phone: Not on file    Gets together: Not on file    Attends religious service: Not on file    Active member of club or organization: Not on file    Attends meetings of clubs or organizations: Not on file    Relationship status: Not on file  Other Topics Concern   Not on file  Social History Narrative   Not on file     Review of Systems: A 12 point ROS discussed and pertinent positives are indicated in the HPI above.  All other systems are negative.  Review of Systems  Constitutional: Positive for fatigue. Negative for chills and fever.  Respiratory: Negative for cough and shortness of breath.   Cardiovascular: Negative for chest pain.  Gastrointestinal: Positive for abdominal pain, nausea and vomiting.  Neurological: Negative for numbness and headaches.    Vital Signs: BP (!) 155/60    Pulse 92    Temp 98.5 F (36.9 C) (Oral)    Resp 20    Ht  (1.575 m)    Wt 242 lb 11.6 oz  (110.1 kg)    LMP  (LMP Unknown)    SpO2 93%    BMI 44.40 kg/m   Physical Exam Vitals signs and nursing note reviewed.  Constitutional:      General: She is not in acute distress.    Appearance: She is ill-appearing.  HENT:     Head: Normocephalic.  Cardiovascular:     Rate and Rhythm: Normal rate and regular rhythm.  Pulmonary:     Effort: Pulmonary effort is normal.     Breath sounds: Normal breath sounds.  Abdominal:     Tenderness: There is abdominal tenderness (diffuse).     Comments: (+) RLQ colostomy with light brown output. (+) midline wound vac. (+) LLQ drain to JP.  Skin:    General: Skin is warm and dry.  Neurological:     Mental Status: She is alert.     Comments: Somnolent, oriented to person and place.      MD Evaluation Airway: WNL Heart: WNL Abdomen: WNL(midline wound vac, LLQ JP, RLQ colostomy) Chest/ Lungs: WNL ASA  Classification: 3 Mallampati/Airway Score: Two   Imaging: Ct Abdomen Pelvis Wo Contrast  Result Date: 07/29/2018 CLINICAL DATA:  Colitis. Septic shock. Colectomy for ischemic colitis. Liver pressure. EXAM: CT ABDOMEN AND PELVIS WITHOUT CONTRAST TECHNIQUE: Multidetector CT imaging of the abdomen and pelvis was performed following the standard protocol without IV contrast. COMPARISON:  CT 07/22/2018 FINDINGS: Lower chest: Dense consolidation in the LEFT lower lobe with air bronchograms. Mild consolidation in LEFT lower lobe. Bilateral small effusions. Hepatobiliary: No focal hepatic lesion. Postcholecystectomy. Non IV contrast exam. Pancreas: Pancreas is normal. No ductal dilatation. No pancreatic inflammation. Spleen: Normal spleen Adrenals/urinary tract: Adrenal glands normal. No renal obstruction. 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 Bladder is mildly distended. Stomach/Bowel: Stomach is fluid-filled. The duodenum is normal. There is mild dilatation of the small bowel up to 3.2 cm. Loop of small bowel in the LEFT abdomen is  edematous with bowel wall measuring up to 20 mm (image 45/5 and 51/2). No evidence of high-grade obstruction. No pneumatosis or intraperitoneal free air identified. RIGHT lower quadrant end ileostomy noted. There is a fluid within the mesentery associated with the small bowel. One collection of fluid in the RIGHT upper quadrant measures 5.0 by 4.3  cm (image 44/2). This collection is minimal organization. There is a percutaneous drain in the lateral RIGHT abdomen paralleling the distal ileum. Vascular/Lymphatic: Abdominal aorta is normal caliber with atherosclerotic calcification. There is no retroperitoneal or periportal lymphadenopathy. No pelvic lymphadenopathy. Reproductive: Uterus and adnexa grossly normal Other: No intraperitoneal free air. Musculoskeletal: No aggressive osseous lesion. IMPRESSION: 1. Dense consolidation RIGHT lower lobe is concerning for pneumonia. 2. No evidence of bowel obstruction. Edematous small bowel in the LEFT abdomen. No pneumatosis. 3. Mild intraabdominal free fluid. One pocket of fluid in the RIGHT upper quadrant with minimal organization measures 5 cm. 4. Percutaneous drain in the RIGHT abdomen. 5. Post colectomy and end ileostomy. Electronically Signed   By: Genevive Bi [name]   On: 07/29/2018 10:56   Ct Abdomen Pelvis Wo Contrast  Result Date: 07/22/2018 CLINICAL DATA:  Abdominal pain for several days EXAM: CT ABDOMEN AND PELVIS WITHOUT CONTRAST TECHNIQUE: Multidetector CT imaging of the abdomen and pelvis was performed following the standard protocol without IV contrast. COMPARISON:  MRI from 06/19/2008 FINDINGS: Lower chest: Dependent atelectatic changes are noted. Hepatobiliary: Liver is well visualized and within normal limits. A mild amount of perihepatic ascites is noted. The gallbladder is well distended without significant inflammatory change. Pancreas: Unremarkable. No pancreatic ductal dilatation or surrounding inflammatory changes. Spleen: Normal in size without  focal abnormality. Adrenals/Urinary Tract: Adrenal glands are within normal limits. Kidneys are well visualized bilaterally. 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 No obstructive changes are seen. The ureters and urinary bladder are within normal limits. Stomach/Bowel: Changes of reflux are noted. Small hiatal hernia is noted. Colon is distended with fluid and fecal material although no obstructive lesion is seen. Very mild pericolonic inflammatory changes are noted in the distal transverse colon. This may represent a generalized colitis. No small bowel abnormality is seen. Vascular/Lymphatic: Aortic atherosclerosis. No enlarged abdominal or pelvic lymph nodes. Reproductive: Uterus and bilateral adnexa are unremarkable. Other: Minimal free fluid is noted within the pelvis. Postsurgical changes are noted in the anterior abdominal wall consistent with the given clinical history. Musculoskeletal: Degenerative changes are noted. IMPRESSION: Mild dependent atelectatic changes. Well distended gallbladder without definitive signs of inflammatory change. Colon is mildly distended with fluid and fecal material with very mild pericolonic changes which may represent a generalized colitis. Mild ascites Mild hiatal hernia. Stable right renal angiomyolipoma. Electronically Signed   By: Alcide Clever [name]   On: 07/22/2018 07:33   Dg Chest 1 View  Result Date: 07/29/2018 CLINICAL DATA:  78 y/o  F; shortness of breath. EXAM: CHEST  1 VIEW COMPARISON:  07/29/2018 chest radiograph. FINDINGS: Stable cardiac silhouette given projection and technique. Aortic atherosclerosis with calcification. Stable left-greater-than-right lung base consolidations. Enteric tube extends below the field of view into the abdomen. Right PICC line tip projects over lower SVC. No pleural effusion or pneumothorax. No acute osseous abnormality is evident. IMPRESSION: Stable left-greater-than-right lung base consolidations. Right PICC line tip projects over  lower SVC. Enteric tube tip extends below the field of view into the abdomen. Electronically Signed   By: Mitzi Hansen [name]   On: 07/29/2018 22:29   Dg Abd 1 View  Result Date: 07/29/2018 CLINICAL DATA:  79 y/o  F; shortness of breath. EXAM: ABDOMEN - 1 VIEW COMPARISON:  07/29/2018 CT abdomen and pelvis and abdomen radiographs. FINDINGS: Enteric tube tip projects over the gastric body. Advanced rotatory dextrocurvature of lumbar spine. Normal included bowel gas pattern, the right abdomen is excluded. IMPRESSION: Enteric tube tip projects over  the gastric body. Electronically Signed   By: Mitzi HansenLance  Furusawa-Stratton [name]   On: 07/29/2018 22:30   Dg Abd 1 View  Result Date: 07/29/2018 CLINICAL DATA:  Abdominal distension. EXAM: ABDOMEN - 1 VIEW COMPARISON:  Abdominal radiographs 07/23/2018. CT of the abdomen and pelvis 07/22/2018. FINDINGS: Moderate prominence of small bowel loops are present following total colectomy and cholecystectomy. Surgical drain is in place. Degenerative changes are noted in the lumbar spine. IMPRESSION: 1. Mild prominence of small bowel loops without obstruction likely represents mild ileus. 2. Total colectomy. Electronically Signed   By: Marin Robertshristopher  Mattern [name]   On: 07/29/2018 05:26   Dg Abd 1 View  Result Date: 07/23/2018 CLINICAL DATA:  Check gastric catheter placement EXAM: ABDOMEN - 1 VIEW COMPARISON:  None. FINDINGS: Gastric catheter is noted within the stomach. No free air is seen. Paucity of bowel gas is noted. IMPRESSION: Gastric catheter within the stomach. Electronically Signed   By: Alcide CleverMark  Lukens [name]   On: 07/23/2018 08:25   Ct Head Wo Contrast  Result Date: 07/29/2018 CLINICAL DATA:  Fever.  Acute mental status changes. EXAM: CT HEAD WITHOUT CONTRAST TECHNIQUE: Contiguous axial images were obtained from the base of the skull through the vertex without intravenous contrast. COMPARISON:  None. FINDINGS: Brain: Mild atrophy and white matter changes are  within normal limits for age. No acute infarct, hemorrhage, or mass lesion is present. The ventricles are of normal size. No significant extraaxial fluid collection is present. Vascular: Atherosclerotic changes are noted in the cavernous internal carotid arteries and at the dural margin of both vertebral arteries. There is no hyperdense vessel. Skull: Calvarium is intact. No focal lytic or blastic lesions are present. No significant extra cranial soft tissue lesions are present. Sinuses/Orbits: The paranasal sinuses and mastoid air cells are clear. The globes and orbits are within normal limits. IMPRESSION: 1. Normal CT appearance of the brain for age. 2. Diffuse atherosclerotic calcifications. Electronically Signed   By: Marin Robertshristopher  Mattern [name]   On: 07/29/2018 06:31   Ct Abdomen Pelvis W Contrast  Result Date: 08/08/2018 CLINICAL DATA:  Postoperative abdominal pain, foul-smelling discharge, JP drain, wound VAC EXAM: CT ABDOMEN AND PELVIS WITH CONTRAST TECHNIQUE: Multidetector CT imaging of the abdomen and pelvis was performed using the standard protocol following bolus administration of intravenous contrast. CONTRAST:  100mL OMNIPAQUE IOHEXOL 300 MG/ML SOLN, 30mL OMNIPAQUE IOHEXOL 300 MG/ML SOLN, additional oral enteric contrast COMPARISON:  07/29/2018 FINDINGS: Lower chest: Moderate left, small right pleural effusions and associated atelectasis or consolidation. Hepatobiliary: No focal liver abnormality is seen. Status post cholecystectomy. No biliary dilatation. Pancreas: Unremarkable. No pancreatic ductal dilatation or surrounding inflammatory changes. Spleen: Normal in size without significant abnormality. Adrenals/Urinary Tract: Adrenal glands are unremarkable. Kidneys are normal, without renal calculi, solid lesion, or hydronephrosis. Bladder is unremarkable. Stomach/Bowel: Stomach is within normal limits. Status post colectomy with right lower quadrant end ostomy. Vascular/Lymphatic: Aortic  atherosclerosis. No enlarged abdominal or pelvic lymph nodes. Reproductive: No mass or other significant abnormality. Other: Surgical drain is positioned about the low abdomen, tip in the right lower quadrant. Small volume loculated appearing perisplenic ascites similar to prior (series 2, image 23). Loculated appearing air and fluid collection about the anterior left hemiabdomen adjacent to loops of small bowel measuring approximately 15.7 x 7.7 x 4.8 cm (series 2, image 50, series 6, image 58). Musculoskeletal: No acute or significant osseous findings. IMPRESSION: 1. Loculated appearing air and fluid collection about the anterior left hemiabdomen adjacent to loops of small bowel  measuring approximately 15.7 x 7.7 x 4.8 cm (series 2, image 50, series 6, image 58), highly suspicious for abscess. 2. Small volume loculated appearing ascites. 3. Surgical drain in the low abdomen, status post colectomy with right lower quadrant end ostomy. 4.  Pleural effusions. Electronically Signed   By: Eddie Candle [name]   On: 08/08/2018 20:19   Dg Chest Port 1 View  Result Date: 08/02/2018 CLINICAL DATA:  Respiratory failure EXAM: PORTABLE CHEST 1 VIEW COMPARISON:  07/29/2018 chest radiograph. FINDINGS: Interval removal of enteric tube. Right PICC terminates over the right atrium. Stable cardiomediastinal silhouette with normal heart size. No pneumothorax. No pleural effusion. Hazy parahilar and bibasilar lung opacities are not substantially changed. IMPRESSION: 1. Right PICC terminates over the right atrium. 2. No substantial change in hazy parahilar and bibasilar lung opacities. Electronically Signed   By: Ilona Sorrel [name]   On: 08/02/2018 07:14   Dg Chest Port 1 View  Result Date: 07/29/2018 CLINICAL DATA:  Hypoxia. EXAM: PORTABLE CHEST 1 VIEW COMPARISON:  One-view chest x-ray 07/28/2018 FINDINGS: Heart size is normal. The patient has been extubated. Left subclavian line is stable. Lung volumes remain low. Aeration of  both lungs is slightly improved. Bibasilar airspace opacities remain. Left greater than right pleural effusion is present. IMPRESSION: 1. Improving aeration the bilateral lower lobe airspace disease. Disease remains worse on the left. 2. Interval extubation and removal of NG tube. 3. Left subclavian line is stable. 4. Persistent low lung volumes. Electronically Signed   By: San Morelle [name]   On: 07/29/2018 05:35   Dg Chest Port 1 View  Result Date: 07/28/2018 CLINICAL DATA:  Acute respiratory failure. EXAM: PORTABLE CHEST 1 VIEW COMPARISON:  07/27/2018.  07/26/2018. FINDINGS: Endotracheal tube, left subclavian line, NG tube in stable position. Heart size normal. Bilateral pulmonary infiltrates/edema and small bilateral pleural effusions again noted. These findings have progressed from prior exam. IMPRESSION: 1.  Lines and tubes in stable position. 2. Progressive bilateral pulmonary infiltrates/edema small bilateral pleural effusions Electronically Signed   By: Marcello Moores  Register   On: 07/28/2018 07:57   Dg Chest Port 1 View  Result Date: 07/27/2018 CLINICAL DATA:  Shortness of breath EXAM: PORTABLE CHEST 1 VIEW COMPARISON:  07/26/2018 FINDINGS: Support devices are in stable position. Bilateral perihilar and lower lobe airspace opacities, slightly improved. Heart is normal size. Suspect small effusions. No acute bony abnormality. IMPRESSION: Bilateral lower lobe airspace opacities and perihilar opacities, slightly improved since prior study. Suspect small effusions. Electronically Signed   By: Rolm Baptise [name]   On: 07/27/2018 03:27   Dg Chest Port 1 View  Result Date: 07/26/2018 CLINICAL DATA:  Pleural effusion. EXAM: PORTABLE CHEST 1 VIEW COMPARISON:  07/25/2018 FINDINGS: The patient is again rotated to the right. Endotracheal tube terminates just below the clavicles, well above the carina. Enteric tube courses into the stomach with tip not imaged. Left subclavian catheter terminates over the  SVC. The cardiomediastinal silhouette is unchanged. The lungs remain hypoinflated with unchanged pulmonary vascular congestion. Bilateral pleural effusions and patchy bibasilar airspace opacities also not significantly changed. No pneumothorax is identified. IMPRESSION: Unchanged pulmonary vascular congestion, bilateral pleural effusions, and bibasilar atelectasis or pneumonia. Electronically Signed   By: Logan Bores [name]   On: 07/26/2018 07:40   Dg Chest Port 1 View  Result Date: 07/25/2018 CLINICAL DATA:  Respiratory failure EXAM: PORTABLE CHEST 1 VIEW COMPARISON:  07/24/2018 FINDINGS: Cardiac shadow is stable. Endotracheal tube and gastric catheter as well as a left subclavian  central line are again noted and stable. Central vascular congestion is noted with evidence of bilateral pleural effusions and bibasilar atelectasis. No new focal infiltrate is seen. IMPRESSION: Relatively stable appearance of the chest when compared with the previous day. Electronically Signed   By: Alcide Clever [name]   On: 07/25/2018 08:00   Dg Chest Port 1 View  Result Date: 07/24/2018 CLINICAL DATA:  Follow-up left basilar atelectasis EXAM: PORTABLE CHEST 1 VIEW COMPARISON:  07/23/2018 FINDINGS: Endotracheal tube, left subclavian central line and gastric catheter are noted. Significant increase in pleural fluid is noted bilaterally right greater than left. Mild underlying vascular congestion is seen as well as patchy left basilar infiltrate. IMPRESSION: Increasing effusions bilaterally. Tubes and lines as described above. Stable left basilar atelectasis. Electronically Signed   By: Alcide Clever [name]   On: 07/24/2018 08:08   Dg Chest Port 1 View  Result Date: 07/23/2018 CLINICAL DATA:  Respiratory failure EXAM: PORTABLE CHEST 1 VIEW COMPARISON:  07/23/2018 FINDINGS: Cardiac shadow is stable. Endotracheal tube, gastric catheter and left subclavian central line are noted. Patchy left basilar atelectasis is seen. No other focal  abnormality is noted. Overall inspiratory effort is poor. IMPRESSION: Left basilar atelectasis. Tubes and lines as described. Electronically Signed   By: Alcide Clever [name]   On: 07/23/2018 08:26   Dg Chest Port 1 View  Result Date: 07/23/2018 CLINICAL DATA:  Sepsis EXAM: PORTABLE CHEST 1 VIEW COMPARISON:  Chest x-ray dated 05/06/2014. FINDINGS: The lung volumes are low. There is a left subclavian central venous catheter in place with tip projecting over the SVC. There is a left basilar airspace opacity. No pneumothorax. There may be trace bilateral pleural effusions. There is no acute osseous abnormality. IMPRESSION: 1. Well-positioned left subclavian central venous catheter without evidence of a pneumothorax. 2. Left basilar airspace opacity concerning for pneumonia. 3. Low lung volumes. Electronically Signed   By: Katherine Mantle [name]   On: 07/23/2018 03:44   [place] Ekg Site Rite  Result Date: 07/29/2018 If Site Rite image not attached, placement could not be confirmed due to current cardiac rhythm.   Labs:  CBC: Recent Labs    08/04/18 0430 08/05/18 0830 08/08/18 1544 08/09/18 0455  WBC 10.9* 11.4* 10.0 11.4*  HGB 8.3* 8.2* 7.7* 7.7*  HCT 27.3* 28.1* 25.8* 25.4*  PLT 315 340 392 404*    COAGS: No results for input(s): INR, APTT in the last 8760 hours.  BMP: Recent Labs    08/06/18 0253 08/07/18 0408 08/08/18 0449 08/09/18 0455  NA 142 141 143 142  K 5.2* 5.1 4.9 5.0  CL 115* 115* 115* 107  CO2 19*  GLUCOSE 182* 154* 111* 91  BUN 66* 65* 55* 47*  CALCIUM 8.1* 8.2* 8.3* 8.8*  CREATININE 1.15* 1.28* 0.99 1.00  GFRNONAA 45* 39* 54* 53*  GFRAA 52* 46* >60 >60    LIVER FUNCTION TESTS: Recent Labs    07/28/18 0352 07/31/18 0500 08/04/18 0430 08/07/18 0408  BILITOT 0.7 0.2* 0.5 0.2*  AST 26 23 35 37  ALT 20 13 33 33  ALKPHOS 92 51 99 111  PROT 4.4* 4.1* 5.4* 5.2*  ALBUMIN 1.7* 1.3* 1.7* 1.6*    TUMOR MARKERS: No results for input(s): AFPTM, CEA,  CA199, CHROMGRNA in the last 8760 hours.  Assessment and Plan:  78 y/o F s/p exploratory laparotomy, total abdominal colectomy, [name] and cholecystectomy for ischemic colitis on 07/23/18. CT abd/pelvis was obtained yesterday due to post-op abdominal pain  and foul-smelling discharge from JP drain and wound vac which showed a loculated appearing air and fluid collection about the anterior left hemiabdomen adjacent to loops of small bowel measuring approximately 15.7 x 7.7 x 4.8 cm highly suspicious for abscess. IR has been consulted for abdominal abscess aspiration/drain placement. Patient and imaging reviewed by Dr. Loreta Ave who agrees to procedure today in CT.  Patient has been NPO since 0600 today, she is not currently on blood thinning medications. Afebrile, WBC 11.4, hgb 7.7 (baseline), plt 404. Discussed procedure with patient and son Onalee Hua via phone today who both give verbal consent to drain placement. IR will continue to follow along with primary team - please call with questions or concerns.  Risks and benefits discussed with the patient and her son Onalee Hua including bleeding, infection, damage to adjacent structures, bowel perforation/fistula connection, and sepsis.  All of the patient's and her son's questions were answered, patient and son are agreeable to proceed.  Consent signed and in chart.  Thank you for this interesting consult.  I greatly enjoyed meeting Hailey Hughes and look forward to participating in their care.  A copy of this report was sent to the requesting provider on this date.  Electronically Signed: Villa Herb, PA-C 08/09/2018, 9:17 AM   I spent a total of 40 Minutes in face to face in clinical consultation, greater than 50% of which was counseling/coordinating care for abdominal abscess drain placement.

## 2018-08-09 NOTE — Progress Notes (Signed)
17 Days Post-Op  Subjective: CC: No acute change.   Objective: Vital signs in last 24 hours: Temp:  [98.4 F (36.9 C)-99.1 F (37.3 C)] 98.5 F (36.9 C) (06/27 0533) Pulse Rate:  [69-92] 92 (06/27 0534) Resp:  [20] 20 (06/27 0533) BP: (117-155)/(60-108) 155/60 (06/27 0534) SpO2:  [93 %-98 %] 93 % (06/27 0534) Last BM Date: 08/07/18  Intake/Output from previous day: 06/26 0701 - 06/27 0700 In: 960 [P.O.:960] Out: 1765 [Urine:1400; Drains:115; Stool:250] Intake/Output this shift: No intake/output data recorded.  PE: Gen: Awake and alert, on O2 Abd: soft, left sided abdominal tenderness without r/r/g. +BS. JP with malodorous/purulent output. Vac output appears similar to JP output. Colostomy bag with dark brown stool in bag.   Lab Results:  Recent Labs    08/08/18 1544 08/09/18 0455  WBC 10.0 11.4*  HGB 7.7* 7.7*  HCT 25.8* 25.4*  PLT 392 404*   BMET Recent Labs    08/08/18 0449 08/09/18 0455  NA 143 142  K 4.9 PENDING  CL 115* 107  CO2 22 19*  GLUCOSE 111* 91  BUN 55* 47*  CREATININE 0.99 1.00  CALCIUM 8.3* 8.8*   PT/INR No results for input(s): LABPROT, INR in the last 72 hours. CMP     Component Value Date/Time   NA 142 08/09/2018 0455   K PENDING 08/09/2018 0455   CL 107 08/09/2018 0455   CO2 19 (L) 08/09/2018 0455   GLUCOSE 91 08/09/2018 0455   BUN 47 (H) 08/09/2018 0455   CREATININE 1.00 08/09/2018 0455   CALCIUM 8.8 (L) 08/09/2018 0455   PROT 5.2 (L) 08/07/2018 0408   ALBUMIN 1.6 (L) 08/07/2018 0408   AST 37 08/07/2018 0408   ALT 33 08/07/2018 0408   ALKPHOS 111 08/07/2018 0408   BILITOT 0.2 (L) 08/07/2018 0408   GFRNONAA 53 (L) 08/09/2018 0455   GFRAA >60 08/09/2018 0455   Lipase     Component Value Date/Time   LIPASE 12 07/25/2018 0500       Studies/Results: Ct Abdomen Pelvis W Contrast  Result Date: 08/08/2018 CLINICAL DATA:  Postoperative abdominal pain, foul-smelling discharge, JP drain, wound VAC EXAM: CT ABDOMEN  AND PELVIS WITH CONTRAST TECHNIQUE: Multidetector CT imaging of the abdomen and pelvis was performed using the standard protocol following bolus administration of intravenous contrast. CONTRAST:  OMNIPAQUE IOHEXOL 300 MG/ML SOLN, 62mL OMNIPAQUE IOHEXOL 300 MG/ML SOLN, additional oral enteric contrast COMPARISON:  07/29/2018 FINDINGS: Lower chest: Moderate left, small right pleural effusions and associated atelectasis or consolidation. Hepatobiliary: No focal liver abnormality is seen. Status post cholecystectomy. No biliary dilatation. Pancreas: Unremarkable. No pancreatic ductal dilatation or surrounding inflammatory changes. Spleen: Normal in size without significant abnormality. Adrenals/Urinary Tract: Adrenal glands are unremarkable. Kidneys are normal, without renal calculi, solid lesion, or hydronephrosis. Bladder is unremarkable. Stomach/Bowel: Stomach is within normal limits. Status post colectomy with right lower quadrant end ostomy. Vascular/Lymphatic: Aortic atherosclerosis. No enlarged abdominal or pelvic lymph nodes. Reproductive: No mass or other significant abnormality. Other: Surgical drain is positioned about the low abdomen, tip in the right lower quadrant. Small volume loculated appearing perisplenic ascites similar to prior (series 2, image 23). Loculated appearing air and fluid collection about the anterior left hemiabdomen adjacent to loops of small bowel measuring approximately 15.7 x 7.7 x 4.8 cm (series 2, image 50, series 6, image 58). Musculoskeletal: No acute or significant osseous findings. IMPRESSION: 1. Loculated appearing air and fluid collection about the anterior left hemiabdomen adjacent to loops of  small bowel measuring approximately 15.7 x 7.7 x 4.8 cm (series 2, image 50, series 6, image 58), highly suspicious for abscess. 2. Small volume loculated appearing ascites. 3. Surgical drain in the low abdomen, status post colectomy with right lower quadrant end ostomy. 4.   Pleural effusions. Electronically Signed   By: Eddie Candle [name]   On: 08/08/2018 20:19    Anti-infectives: Anti-infectives (From admission, onward)   Start     Dose/Rate Route Frequency Ordered Stop   08/06/18 1400  fluconazole (DIFLUCAN) tablet 150 mg     150 mg Oral Daily 08/06/18 1205 08/08/18 0941   07/29/18 0600  meropenem (MERREM) 1 g in sodium chloride 0.9 % 100 mL IVPB  Status:  Discontinued     1 g 200 mL/hr over 30 Minutes Intravenous Every 12 hours 07/29/18 0457 08/03/18 1048   07/29/18 0500  ceFEPIme (MAXIPIME) 2 g in sodium chloride 0.9 % 100 mL IVPB  Status:  Discontinued     2 g 200 mL/hr over 30 Minutes Intravenous Every 24 hours 07/29/18 0436 07/29/18 0457   07/26/18 1400  piperacillin-tazobactam (ZOSYN) IVPB 3.375 g  Status:  Discontinued     3.375 g 12.5 mL/hr over 240 Minutes Intravenous Every 6 hours 07/26/18 0906 07/26/18 1339   07/26/18 1400  cefTRIAXone (ROCEPHIN) 1 g in sodium chloride 0.9 % 100 mL IVPB  Status:  Discontinued     1 g 200 mL/hr over 30 Minutes Intravenous Every 24 hours 07/26/18 1339 07/29/18 0436   07/23/18 0200  piperacillin-tazobactam (ZOSYN) IVPB 2.25 g  Status:  Discontinued     2.25 g 100 mL/hr over 30 Minutes Intravenous Every 6 hours 07/22/18 2248 07/26/18 0906   07/22/18 1800  piperacillin-tazobactam (ZOSYN) IVPB 3.375 g  Status:  Discontinued     3.375 g 12.5 mL/hr over 240 Minutes Intravenous Every 8 hours 07/22/18 0953 07/22/18 2248   07/22/18 1000  piperacillin-tazobactam (ZOSYN) IVPB 3.375 g     3.375 g 100 mL/hr over 30 Minutes Intravenous  Once 07/22/18 0953 07/22/18 1239   07/22/18 0745  ciprofloxacin (CIPRO) IVPB 400 mg     400 mg 200 mL/hr over 60 Minutes Intravenous  Once 07/22/18 0739 07/22/18 0921   07/22/18 0745  metroNIDAZOLE (FLAGYL) IVPB 500 mg     500 mg 100 mL/hr over 60 Minutes Intravenous  Once 07/22/18 0739 07/22/18 1024       Assessment/Plan Type 2 diabetes Hypothyroid Rheumatoid arthritis OSA Morbid  obesity BMI 44 AKI - Cr trending down Malnutrition - prealbumin11.5 Klebsiella UTI, resolved VDRF,resolved Thrombocytopenia -resolved  Septic shock Ischemic colitis, acute cholecystitis POD17,S/pExploratory laparotomy, total abdominal colectomy, [name], cholecystectomy-6/10-Dr. Dalbert Batman - CTAP 6/16 w/out intra-abdominal cause of fever and elevated WBC. There is noted RLL PNA, completed merrem treatment. - - Still having abdominal pain. JP drain and Vac output looks very foul. Still having left sided tenderness. Rpt CT 6/26 with left sided intraabdominal abscess- rec IR consult for perc drain.  - Cont VAC changes. Currently being treated with Diflucan.  - Having decent bowel function currently. -Onsoft diet, continue TNA at half rate and calorie count. If calorie count looks good then we can wean TNA to off. Will make NPO for CTAP for now.  -prealbumin 11.5 -cont surgical JP drain at this time due to slightly brown appearing output. It is possible that she decompressed some fluid out of her rectum yesterday that was residual vs a possible leak at her rectal stump. Her abdominal exam is benign  and if she does have a possible leak it appears well controlled currently with her drain. No indications at this time for abx or any further work up currently. If she were to develop new fevers, worsening abdominal pain, etc, then she may need further imaging.   ID -Meropenem completed, none currently. Will restart zosyn given intraabdominal abscess FEN -IVF,TNA @ half rate, NPO for possible IR VTE -SCDs Follow up -Dr. Marnette Burgess - son Hailey Hughes [phone] 6/23), husband Hailey Hughes [phone]   LOS: 18 days    Berna Bue MD [facility] Surgery 08/09/2018, 8:12 AM  Please see AMION to contact on-call provider if needed.

## 2018-08-10 LAB — CBC WITH DIFFERENTIAL/PLATELET
Abs Immature Granulocytes: 0.11 10*3/uL — ABNORMAL HIGH (ref 0.00–0.07)
Basophils Absolute: 0.1 10*3/uL (ref 0.0–0.1)
Basophils Relative: 1 %
Eosinophils Absolute: 0.2 10*3/uL (ref 0.0–0.5)
Eosinophils Relative: 2 %
HCT: 23.8 % — ABNORMAL LOW (ref 36.0–46.0)
Hemoglobin: 7 g/dL — ABNORMAL LOW (ref 12.0–15.0)
Immature Granulocytes: 1 %
Lymphocytes Relative: 8 %
Lymphs Abs: 0.7 10*3/uL (ref 0.7–4.0)
MCH: 27.3 pg (ref 26.0–34.0)
MCHC: 29.4 g/dL — ABNORMAL LOW (ref 30.0–36.0)
MCV: 93 fL (ref 80.0–100.0)
Monocytes Absolute: 1.4 10*3/uL — ABNORMAL HIGH (ref 0.1–1.0)
Monocytes Relative: 16 %
Neutro Abs: 6.4 10*3/uL (ref 1.7–7.7)
Neutrophils Relative %: 72 %
Platelets: 418 10*3/uL — ABNORMAL HIGH (ref 150–400)
RBC: 2.56 MIL/uL — ABNORMAL LOW (ref 3.87–5.11)
RDW: 14.8 % (ref 11.5–15.5)
WBC: 8.7 10*3/uL (ref 4.0–10.5)
nRBC: 0 % (ref 0.0–0.2)

## 2018-08-10 LAB — BASIC METABOLIC PANEL
Anion gap: 8 (ref 5–15)
BUN: 40 mg/dL — ABNORMAL HIGH (ref 8–23)
CO2: 19 mmol/L — ABNORMAL LOW (ref 22–32)
Calcium: 8.2 mg/dL — ABNORMAL LOW (ref 8.9–10.3)
Chloride: 112 mmol/L — ABNORMAL HIGH (ref 98–111)
Creatinine, Ser: 1.1 mg/dL — ABNORMAL HIGH (ref 0.44–1.00)
GFR calc Af Amer: 55 mL/min — ABNORMAL LOW (ref >60)
GFR calc non Af Amer: 47 mL/min — ABNORMAL LOW (ref >60)
Glucose, Bld: 124 mg/dL — ABNORMAL HIGH (ref 70–99)
Potassium: 4.4 mmol/L (ref 3.5–5.1)
Sodium: 139 mmol/L (ref 135–145)

## 2018-08-10 LAB — GLUCOSE, CAPILLARY
Glucose-Capillary: 112 mg/dL — ABNORMAL HIGH (ref 70–99)
Glucose-Capillary: 168 mg/dL — ABNORMAL HIGH (ref 70–99)
Glucose-Capillary: 142 mg/dL — ABNORMAL HIGH (ref 70–99)
Glucose-Capillary: 171 mg/dL — ABNORMAL HIGH (ref 70–99)

## 2018-08-10 LAB — URIC ACID: Uric Acid, Serum: 6.2 mg/dL (ref 2.5–7.1)

## 2018-08-10 NOTE — Progress Notes (Signed)
For Dietician's calorie count: On 6/28, pt ate 50% of breakfast (1/2 english muffin, 1/2 scrambled eggs, 2 slices bacon and one apple juice), she ate NO LUNCH, and she ate about 50% of dinner (all of mashed potatoes, all of dinner roll, 3 bites [place] with gravy, and iced tea). Pt also drank about 2/3 of TWO Ensures today (about 500 cal total), as well as 2 Pro-Stat supplements (=200 cal)

## 2018-08-10 NOTE — Progress Notes (Signed)
PROGRESS NOTE  Hailey Hughes KNL:[id number] DOB: Jan 26, 1941 DOA: 07/22/2018 PCP: Barbie Banner, MD  HPI/Recap of past 24 hours: Hailey Hughes is a 78 y.o. 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  Her condition rapidly deteriorated needing transfer to ICU on day of admission for persistent hypotension and lactic acidosis with no response with aggressive IV fluid resuscitation and consistent with septic shock as she ultimately required pressor support, intubation for acute proximal respiratory failure, acute renal failure.Patient underwent exploratory laparotomy on 6/10 to progressively worsening abdominal exam concerning for peritonitis and was found to have ischemic colitis requiring abdominal colectomy, ileostomy placement and cholecystectomy.    Hospital course complicated by prolonged weaning of pressor support, concern for aspiration given hypoxia and elevated requirement (6/14), and change in mentation though CT head at that time remained negative attributed to Dilaudid.  Most recently found to have fluid collection in her abdomen.  Underwent aspiration and drain placement by interventional radiology on 6/27  Patient seen prior to aspiration and drain placement.  Doing okay, complains of some bilateral ankle pain, left greater than right, was worried about gout  Assessment/Plan: Principal Problem:   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 Active Problems:   Hypothyroidism   History of Hirschsprung's disease   Hypotension   Elevated lactic acid level   CKD (chronic kidney disease) stage 3, GFR 30-59 ml/min (HCC)   Diabetes (HCC)  Hyperlipidemia   Acute respiratory failure (HCC)   Sepsis (HCC)   Pressure injury of skin   Moderate protein malnutrition (HCC)   Hyperphosphatemia   Abdominal fluid collection 1. 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  Surgery managing JP drain, TPN weaned off by surgery. continue to monitor bowel function/VAC changes, pain control 2. Foul smelling Vac output. Surgery obtained CT abd for further evaluation which shows loculated fluid collection concerning for abscess in the left hemiabdomen and small volume ascites.  Underwent aspiration and drain placement by interventional radiology on 6/27.  Zosyn started for 5 days on 6/27. 3. Moderate protein calorie malnutrition in patient with morbid obesity.  Related to abdominal surgery/poor absorption.  Prealbumin 11.5.  Continuing half rate TPN for another day to try to avoid NGT or TF, calorie count as mentioned above, tolerating soft diet, advance as tolerated per surgery recommendations.  Meets criteria for morbid obesity with BMI greater than 40 4. Septic shock, resolved.  Likely bacterial translocation from ischemic colitis,  no longer requiring pressor support, completed Zosyn (6/9-6/13), Rocephin 6/13-6/16), meropenem 5. Acute hypoxic respiratory failure, resolved.  Intubated on 6/10, extubated on 6/15. Completed 8-day (6/16-2/23) meropenem therapy, likely multifactorial as related to aspiration pneumonia (right lower lobe consolidation on imaging) as well as severe sepsis from colitis. Encourage IS 6. Hyperkalemia,improving. Now down to 4.4 did not require lokelma for 48 hours.   Potassium removed from TPN fluid 7. AKI on CKD, Stage 3 resolved. Peak creatinine of 3. AKI from ATN.  baseline 1.4-1.6(care everywhere)   back to baseline.  Avoiding nephrotoxins hyperphosphatemia. Unclear etiology as kidney function seems stable, repeat lab shows resolution 8. Normocytic anemia.  Iron  panel consistent with anemia of chronic disease, no acute blood loss, closely monitor CBC.  Staying between 7 and 8, although 7.0 today.  Transfuse for Hgb <7 9. Type 2 diabetes.  Monitor CBG, continue sliding scale, 25 U insulin in TPN 10. Hypothyroidism continue Synthroid  11. Deconditioning, PT/OT recs SNF 12. Pressure injury of sacrum, not present on admission.  Skin intact, continue local care Bilateral ankle pain: Somewhat tender although does not appear to be gout.  More tender on malleoli areas were legs are diffusely swollen from third spacing.  Uric acid level normal  Code Status: Full code  Family Communication: Updated son by phone  Disposition Plan: Eventual discharge to skilled nursing, after Zosyn finished & confirmation that oral caloric intake is adequate    Consultants:  Critical care  General surgery  Interventional radiology  Procedures:  Exploratory laparotomy  Aspiration of residual fluid plus drain placement 6/27  Antimicrobials:  IV Zosyn 6/9-6/13, restarted 6/27-7/1  IV Rocephin 6/13-6/16  IV meropenem completed  DVT prophylaxis: SCDs   Objective: Vitals:   08/10/18 0611 08/10/18 1436  BP: 134/66 (!) 152/67  Pulse: 86 93  Resp: 20 16  Temp: 98.4 F (36.9 C) 98.6 F (37 C)  SpO2: 95% 96%    Intake/Output Summary (Last 24 hours) at 08/10/2018 1521 Last data filed at 08/10/2018 1200 Gross per 24 hour  Intake 1001.94 ml  Output 2415 ml  Net -1413.06 ml   Filed Weights   07/30/18 0910 07/31/18 1016 08/07/18 0551  Weight: 112.3 kg 107.2 kg 110.1 kg   Body mass index is 44.4 kg/m.  Exam:   General: Alert and oriented x3, no acute distress  HEENT: Normocephalic and atraumatic, mucous membranes are moist  Neck: Supple, no JVD  Cardiovascular: Regular rate and rhythm, S1-S2  Respiratory: Clear to auscultation bilaterally  Abdomen: Soft, mild distention, tender in lower quadrants, hypoactive bowel sounds  Musculoskeletal: No  clubbing or cyanosis, 1+ pitting edema, tenderness at bilateral internal malleoli areas  Skin: No skin lesions  Psychiatry: Appropriate, no evidence of psychoses  Neuro: No focal deficits   Data Reviewed: CBC: Recent Labs  Lab 08/04/18 0430 08/05/18 0830 08/08/18 1544 08/09/18 0455 08/10/18 0338  WBC 10.9* 11.4* 10.0 11.4* 8.7  NEUTROABS 7.1  --   --  8.6* 6.4  HGB 8.3* 8.2* 7.7* 7.7* 7.0*  HCT 27.3* 28.1* 25.8* 25.4* 23.8*  MCV 94.1 94.9 95.9 93.7 93.0  PLT 315 340 392 404* 418*   Basic Metabolic Panel: Recent Labs  Lab 08/04/18 0430 08/05/18 0632 08/06/18 0253 08/07/18 0408 08/08/18 0449 08/09/18 0455 08/10/18 0338  NA 144 142 142 141 143 142 139  K 4.9 5.2* 5.2* 5.1 4.9 5.0 4.4  CL 114* 115* 115* 115* 115* 107 112*  CO2 21* 20* 22 22 22  19* 19*  GLUCOSE 133* 72 182* 154* 111* 91 124*  BUN 82* 70* 66* 65* 55* 47* 40*  CREATININE 1.23* 1.22* 1.15* 1.28* 0.99 1.00 1.10*  CALCIUM 8.6* 8.2* 8.1* 8.2* 8.3* 8.8* 8.2*  MG 2.3  --  2.1 2.1  --   --   --   PHOS 3.5 4.2 3.8 5.0* 3.8  --   --    GFR: Estimated Creatinine Clearance: 47.7 mL/min (A) (by C-G formula based on SCr of 1.1 mg/dL (H)). Liver Function Tests: Recent Labs  Lab 08/04/18 0430 08/07/18 0408  AST 35 37  ALT 33 33  ALKPHOS 99 111  BILITOT 0.5 0.2*  PROT 5.4* 5.2*  ALBUMIN 1.7* 1.6*   No results for input(s): LIPASE, AMYLASE in the last 168  hours. No results for input(s): AMMONIA in the last 168 hours. Coagulation Profile: No results for input(s): INR, PROTIME in the last 168 hours. Cardiac Enzymes: No results for input(s): CKTOTAL, CKMB, CKMBINDEX, TROPONINI in the last 168 hours. BNP (last 3 results) No results for input(s): PROBNP in the last 8760 hours. HbA1C: No results for input(s): HGBA1C in the last 72 hours. CBG: Recent Labs  Lab 08/09/18 1135 08/09/18 1546 08/09/18 2056 08/10/18 0728 08/10/18 1139  GLUCAP 86 89 105* 112* 168*   Lipid Profile: No results for input(s):  CHOL, HDL, LDLCALC, TRIG, CHOLHDL, LDLDIRECT in the last 72 hours. Thyroid Function Tests: No results for input(s): TSH, T4TOTAL, FREET4, T3FREE, THYROIDAB in the last 72 hours. Anemia Panel: No results for input(s): VITAMINB12, FOLATE, FERRITIN, TIBC, IRON, RETICCTPCT in the last 72 hours. Urine analysis:    Component Value Date/Time   COLORURINE AMBER (A) 07/22/2018 0247   APPEARANCEUR CLOUDY (A) 07/22/2018 0247   LABSPEC 1.013 07/22/2018 0247   PHURINE 5.0 07/22/2018 0247   GLUCOSEU NEGATIVE 07/22/2018 0247   HGBUR SMALL (A) 07/22/2018 0247   BILIRUBINUR NEGATIVE 07/22/2018 0247   KETONESUR 5 (A) 07/22/2018 0247   PROTEINUR 100 (A) 07/22/2018 0247   NITRITE NEGATIVE 07/22/2018 0247   LEUKOCYTESUR SMALL (A) 07/22/2018 0247   Sepsis Labs: @LABRCNTIP (procalcitonin:4,lacticidven:4)  ) Recent Results (from the past 240 hour(s))  Aerobic/Anaerobic Culture (surgical/deep wound)     Status: None (Preliminary result)   Collection Time: 08/09/18  2:52 PM   Specimen: Abscess  Result Value Ref Range Status   Specimen Description   Final    ABSCESS Performed at [facility], [place] [address], [place], [place] [address]    Special Requests   Final    NONE Performed at [facility], [place] [address] [place], [place] [address]    Gram Stain   Final    ABUNDANT WBC PRESENT, PREDOMINANTLY PMN ABUNDANT GRAM POSITIVE COCCI IN PAIRS ABUNDANT GRAM NEGATIVE RODS    Culture   Final    ABUNDANT ENTEROCOCCUS FAECALIS CULTURE REINCUBATED FOR BETTER GROWTH Performed at [facility], [place] [address], [place], [place] [address]    Report Status PENDING  Incomplete      Studies: No results found.  Scheduled Meds: . acetaminophen  1,000 mg Oral Q6H  . Chlorhexidine Gluconate Cloth  6 each Topical Daily  . feeding supplement (ENSURE ENLIVE)  237 mL Oral TID BM  . feeding supplement (PRO-STAT SUGAR FREE 64)  30 mL Oral TID  . insulin aspart  0-15  Units Subcutaneous TID AC & HS  . levothyroxine  112 mcg Oral Q0600  . mouth rinse  15 mL Mouth Rinse BID  . nystatin  5 mL Oral QID  . nystatin   Topical QODAY  . olopatadine  1 drop Both Eyes BID  . pantoprazole  40 mg Oral QHS  . sodium chloride flush  10-40 mL Intracatheter Q12H  . sodium chloride flush  5 mL Intracatheter Q8H  . tobramycin-dexamethasone  1 application Both Eyes BID    Continuous Infusions: . sodium chloride    . piperacillin-tazobactam (ZOSYN)  IV 12.5 mL/hr at 08/10/18 1200     LOS: 19 days     Annita Brod, MD [facility]  To reach me or the doctor on call, go to: [email or web address] Password [email or web address]  08/10/2018, 3:21 PM

## 2018-08-10 NOTE — Progress Notes (Signed)
18 Days Post-Op  Subjective: CC: Feels a little better. Drain placed yesterday with 200cc foul grey output.   Objective: Vital signs in last 24 hours: Temp:  [98.3 F (36.8 C)-98.5 F (36.9 C)] 98.4 F (36.9 C) (06/28 0611) Pulse Rate:  [83-94] 86 (06/28 0611) Resp:  [17-24] 20 (06/28 0611) BP: (125-180)/(57-104) 134/66 (06/28 0611) SpO2:  [95 %-100 %] 95 % (06/28 0611) Last BM Date: 08/07/18  Intake/Output from previous day: 06/27 0701 - 06/28 0700 In: 512 [P.O.:357; IV Piggyback:150] Out: 9735 [Urine:1850; Drains:265; Stool:350] Intake/Output this shift: Total I/O In: 240 [P.O.:240] Out: 200 [Urine:200]  PE: Gen: Awake and alert, on O2 Abd: soft, left sided abdominal tenderness improved.  Drains with malodorous/purulent output. Vac output appears similar to JP output. Colostomy bag with dark brown stool in bag.   Lab Results:  Recent Labs    08/09/18 0455 08/10/18 0338  WBC 11.4* 8.7  HGB 7.7* 7.0*  HCT 25.4* 23.8*  PLT 404* 418*   BMET Recent Labs    08/09/18 0455 08/10/18 0338  NA 142 139  K 5.0 4.4  CL 107 112*  CO2 19* 19*  GLUCOSE 91 124*  BUN 47* 40*  CREATININE 1.00 1.10*  CALCIUM 8.8* 8.2*   PT/INR No results for input(s): LABPROT, INR in the last 72 hours. CMP     Component Value Date/Time   NA 139 08/10/2018 0338   K 4.4 08/10/2018 0338   CL 112 (H) 08/10/2018 0338   CO2 19 (L) 08/10/2018 0338   GLUCOSE 124 (H) 08/10/2018 0338   BUN 40 (H) 08/10/2018 0338   CREATININE 1.10 (H) 08/10/2018 0338   CALCIUM 8.2 (L) 08/10/2018 0338   PROT 5.2 (L) 08/07/2018 0408   ALBUMIN 1.6 (L) 08/07/2018 0408   AST 37 08/07/2018 0408   ALT 33 08/07/2018 0408   ALKPHOS 111 08/07/2018 0408   BILITOT 0.2 (L) 08/07/2018 0408   GFRNONAA 47 (L) 08/10/2018 0338   GFRAA 55 (L) 08/10/2018 0338   Lipase     Component Value Date/Time   LIPASE 12 07/25/2018 0500       Studies/Results: Ct Abdomen Pelvis W Contrast  Result Date: 08/08/2018  CLINICAL DATA:  Postoperative abdominal pain, foul-smelling discharge, JP drain, wound VAC EXAM: CT ABDOMEN AND PELVIS WITH CONTRAST TECHNIQUE: Multidetector CT imaging of the abdomen and pelvis was performed using the standard protocol following bolus administration of intravenous contrast. CONTRAST:  131mL OMNIPAQUE IOHEXOL 300 MG/ML SOLN, 63mL OMNIPAQUE IOHEXOL 300 MG/ML SOLN, additional oral enteric contrast COMPARISON:  07/29/2018 FINDINGS: Lower chest: Moderate left, small right pleural effusions and associated atelectasis or consolidation. Hepatobiliary: No focal liver abnormality is seen. Status post cholecystectomy. No biliary dilatation. Pancreas: Unremarkable. No pancreatic ductal dilatation or surrounding inflammatory changes. Spleen: Normal in size without significant abnormality. Adrenals/Urinary Tract: Adrenal glands are unremarkable. Kidneys are normal, without renal calculi, solid lesion, or hydronephrosis. Bladder is unremarkable. Stomach/Bowel: Stomach is within normal limits. Status post colectomy with right lower quadrant end ostomy. Vascular/Lymphatic: Aortic atherosclerosis. No enlarged abdominal or pelvic lymph nodes. Reproductive: No mass or other significant abnormality. Other: Surgical drain is positioned about the low abdomen, tip in the right lower quadrant. Small volume loculated appearing perisplenic ascites similar to prior (series 2, image 23). Loculated appearing air and fluid collection about the anterior left hemiabdomen adjacent to loops of small bowel measuring approximately 15.7 x 7.7 x 4.8 cm (series 2, image 50, series 6, image 58). Musculoskeletal: No acute or significant osseous  findings. IMPRESSION: 1. Loculated appearing air and fluid collection about the anterior left hemiabdomen adjacent to loops of small bowel measuring approximately 15.7 x 7.7 x 4.8 cm (series 2, image 50, series 6, image 58), highly suspicious for abscess. 2. Small volume loculated appearing  ascites. 3. Surgical drain in the low abdomen, status post colectomy with right lower quadrant end ostomy. 4.  Pleural effusions. Electronically Signed   By: Lauralyn Primes [name]   On: 08/08/2018 20:19   Ct Image Guided Drainage By Percutaneous Catheter  Result Date: 08/10/2018 INDICATION: 77 year old female with a history abdominal abscess EXAM: CT GUIDED DRAINAGE OF ABSCESS MEDICATIONS: The patient is currently admitted to the hospital and receiving intravenous antibiotics. The antibiotics were administered within an appropriate time frame prior to the initiation of the procedure. ANESTHESIA/SEDATION: 1.0 mg IV Versed 50 mcg IV Fentanyl Moderate Sedation Time:  24 The patient was continuously monitored during the procedure by the interventional radiology nurse under my direct supervision. COMPLICATIONS: None TECHNIQUE: Informed written consent was obtained from the patient after a thorough discussion of the procedural risks, benefits and alternatives. All questions were addressed. Maximal Sterile Barrier Technique was utilized including caps, mask, sterile gowns, sterile gloves, sterile drape, hand hygiene and skin antiseptic. A timeout was performed prior to the initiation of the procedure. PROCEDURE: The operative field was prepped with chlorhexidine in a sterile fashion, and a sterile drape was applied covering the operative field. A sterile gown and sterile gloves were used for the procedure. Local anesthesia was provided with 1% Lidocaine. Once a scout CT was acquired of the abdomen, we targeted the air in fluid collection in the left abdomen. The patient was prepped and draped in the usual sterile fashion. 1% lidocaine was used for local anesthesia. Using CT guidance, modified Seldinger technique was used to place a 10 [place] drain into the fluid and gas collection within the left abdomen. Frankly purulent material was aspirated. Catheter was sutured in position and attached to bulb suction. Patient  tolerated the procedure well and remained hemodynamically stable throughout. No complications were encountered and no significant blood loss. FINDINGS: Scout CT demonstrates surgical changes with persisting fluid and gas collection in the left abdomen. This was the collection targeted. Images demonstrate safe placement of 10 French drain into the fluid collection from left anterior approach. IMPRESSION: Status post CT-guided drainage of left abdominal abscess. Signed, Yvone Neu. Reyne Dumas, RPVI [facility] Radiology Electronically Signed   By: Gilmer Mor D.O.   On: 08/10/2018 08:25    Anti-infectives: Anti-infectives (From admission, onward)   Start     Dose/Rate Route Frequency Ordered Stop   08/09/18 0900  piperacillin-tazobactam (ZOSYN) IVPB 3.375 g     3.375 g 12.5 mL/hr over 240 Minutes Intravenous Every 8 hours 08/09/18 0822     08/06/18 1400  fluconazole (DIFLUCAN) tablet 150 mg     150 mg Oral Daily 08/06/18 1205 08/08/18 0941   07/29/18 0600  meropenem (MERREM) 1 g in sodium chloride 0.9 % 100 mL IVPB  Status:  Discontinued     1 g 200 mL/hr over 30 Minutes Intravenous Every 12 hours 07/29/18 0457 08/03/18 1048   07/29/18 0500  ceFEPIme (MAXIPIME) 2 g in sodium chloride 0.9 % 100 mL IVPB  Status:  Discontinued     2 g 200 mL/hr over 30 Minutes Intravenous Every 24 hours 07/29/18 0436 07/29/18 0457   07/26/18 1400  piperacillin-tazobactam (ZOSYN) IVPB 3.375 g  Status:  Discontinued  3.375 g 12.5 mL/hr over 240 Minutes Intravenous Every 6 hours 07/26/18 0906 07/26/18 1339   07/26/18 1400  cefTRIAXone (ROCEPHIN) 1 g in sodium chloride 0.9 % 100 mL IVPB  Status:  Discontinued     1 g 200 mL/hr over 30 Minutes Intravenous Every 24 hours 07/26/18 1339 07/29/18 0436   07/23/18 0200  piperacillin-tazobactam (ZOSYN) IVPB 2.25 g  Status:  Discontinued     2.25 g 100 mL/hr over 30 Minutes Intravenous Every 6 hours 07/22/18 2248 07/26/18  0906   07/22/18 1800  piperacillin-tazobactam (ZOSYN) IVPB 3.375 g  Status:  Discontinued     3.375 g 12.5 mL/hr over 240 Minutes Intravenous Every 8 hours 07/22/18 0953 07/22/18 2248   07/22/18 1000  piperacillin-tazobactam (ZOSYN) IVPB 3.375 g     3.375 g 100 mL/hr over 30 Minutes Intravenous  Once 07/22/18 0953 07/22/18 1239   07/22/18 0745  ciprofloxacin (CIPRO) IVPB 400 mg     400 mg 200 mL/hr over 60 Minutes Intravenous  Once 07/22/18 0739 07/22/18 0921   07/22/18 0745  metroNIDAZOLE (FLAGYL) IVPB 500 mg     500 mg 100 mL/hr over 60 Minutes Intravenous  Once 07/22/18 0739 07/22/18 1024       Assessment/Plan Type 2 diabetes Hypothyroid Rheumatoid arthritis OSA Morbid obesity BMI 44 AKI - Cr trending down Malnutrition - prealbumin11.5 Klebsiella UTI, resolved VDRF,resolved Thrombocytopenia -resolved  Septic shock Ischemic colitis, acute cholecystitis POD18,S/pExploratory laparotomy, total abdominal colectomy, [name], cholecystectomy-6/10-Dr. Derrell Lolling - CTAP 6/16 w/out intra-abdominal cause of fever and elevated WBC. There is noted RLL PNA, completed merrem treatment. - - Still having abdominal pain. JP drain and Vac output looks very foul. Still having left sided tenderness. Rpt CT 6/26 with left sided intraabdominal abscess- s/p Ir drain 6/27.  - Cont VAC changes. Currently being treated with Diflucan.  - Having decent bowel function currently. -Onsoft diet, continue TNA at half rate and calorie count. If calorie count looks good then we can wean TNA to off..  -prealbumin 11.5 -cont surgical JP drain at this time due to slightly brown appearing output. It is possible that she decompressed some fluid out of her rectum that was residual vs a possible leak at her rectal stump. s/p additional drain placement. Continue abx for 5 days   ID -Meropenem completed, none currently. 6/26 restart zosyn given intraabdominal abscess- now s/p drain, continue  abx for 5 days  FEN -IVF,TNA @ half rate, soft diet VTE -SCDs Follow up -Dr. Marnette Burgess - son Onalee Hua [phone] 6/23), husband Henreitta Cea [phone]   LOS: 19 days    Berna Bue MD [facility] Surgery 08/10/2018, 9:20 AM

## 2018-08-11 LAB — GLUCOSE, CAPILLARY
Glucose-Capillary: 108 mg/dL — ABNORMAL HIGH (ref 70–99)
Glucose-Capillary: 172 mg/dL — ABNORMAL HIGH (ref 70–99)
Glucose-Capillary: 154 mg/dL — ABNORMAL HIGH (ref 70–99)
Glucose-Capillary: 109 mg/dL — ABNORMAL HIGH (ref 70–99)

## 2018-08-11 LAB — CBC WITH DIFFERENTIAL/PLATELET
Abs Immature Granulocytes: 0.12 10*3/uL — ABNORMAL HIGH (ref 0.00–0.07)
Basophils Absolute: 0.1 10*3/uL (ref 0.0–0.1)
Basophils Relative: 1 %
Eosinophils Absolute: 0.3 10*3/uL (ref 0.0–0.5)
Eosinophils Relative: 4 %
HCT: 22.5 % — ABNORMAL LOW (ref 36.0–46.0)
Hemoglobin: 6.6 g/dL — CL (ref 12.0–15.0)
Immature Granulocytes: 2 %
Lymphocytes Relative: 13 %
Lymphs Abs: 1 10*3/uL (ref 0.7–4.0)
MCH: 27.4 pg (ref 26.0–34.0)
MCHC: 29.3 g/dL — ABNORMAL LOW (ref 30.0–36.0)
MCV: 93.4 fL (ref 80.0–100.0)
Monocytes Absolute: 1.4 10*3/uL — ABNORMAL HIGH (ref 0.1–1.0)
Monocytes Relative: 18 %
Neutro Abs: 4.8 10*3/uL (ref 1.7–7.7)
Neutrophils Relative %: 62 %
Platelets: 369 10*3/uL (ref 150–400)
RBC: 2.41 MIL/uL — ABNORMAL LOW (ref 3.87–5.11)
RDW: 14.6 % (ref 11.5–15.5)
WBC: 7.6 10*3/uL (ref 4.0–10.5)
nRBC: 0 % (ref 0.0–0.2)

## 2018-08-11 LAB — BASIC METABOLIC PANEL
Anion gap: 6 (ref 5–15)
BUN: 36 mg/dL — ABNORMAL HIGH (ref 8–23)
CO2: 21 mmol/L — ABNORMAL LOW (ref 22–32)
Calcium: 8.1 mg/dL — ABNORMAL LOW (ref 8.9–10.3)
Chloride: 115 mmol/L — ABNORMAL HIGH (ref 98–111)
Creatinine, Ser: 1.11 mg/dL — ABNORMAL HIGH (ref 0.44–1.00)
GFR calc Af Amer: 54 mL/min — ABNORMAL LOW (ref >60)
GFR calc non Af Amer: 47 mL/min — ABNORMAL LOW (ref >60)
Glucose, Bld: 110 mg/dL — ABNORMAL HIGH (ref 70–99)
Potassium: 4.5 mmol/L (ref 3.5–5.1)
Sodium: 142 mmol/L (ref 135–145)

## 2018-08-11 LAB — PREPARE RBC (CROSSMATCH)

## 2018-08-11 LAB — ABO/RH: ABO/RH(D): A POS

## 2018-08-11 LAB — HEMOGLOBIN AND HEMATOCRIT, BLOOD
HCT: 26.6 % — ABNORMAL LOW (ref 36.0–46.0)
Hemoglobin: 8.2 g/dL — ABNORMAL LOW (ref 12.0–15.0)

## 2018-08-11 MED ORDER — SODIUM CHLORIDE 0.9% IV SOLUTION: Freq: Once | INTRAVENOUS | Status: DC

## 2018-08-11 NOTE — Care Management Important Message (Signed)
Important Message  Patient Details IM Letter given to Kathrin Greathouse SW to present to the Patient Name: Hailey Hughes MRN: [id number] Date of Birth: 1940/03/12   Medicare Important Message Given:  Yes     Kerin Salen 08/11/2018, 11:59 AM

## 2018-08-11 NOTE — Progress Notes (Signed)
Referring Physician(s): Connor,C  Supervising Physician: Simonne ComeWatts, John  [facility]  [name]  Chief Complaint:  Abdominal pain/abscess  Subjective: Pt eating dinner; has some soreness at abd wound/drain sites; no N/V   Allergies: Bee venom and Epinephrine  Medications: Prior to Admission medications   Medication Sig Start Date End Date Taking? Authorizing Provider  amLODipine-benazepril (LOTREL) 5-20 MG per capsule Take 1 capsule by mouth every evening.    Yes [provider]  aspirin 81 MG tablet Take 81 mg by mouth every evening.    Yes [provider]  atorvastatin (LIPITOR) 20 MG tablet Take 20 mg by mouth every evening.    Yes [provider]  bumetanide (BUMEX) 2 MG tablet Take 2 mg by mouth every evening.    Yes [provider]  celecoxib (CELEBREX) 200 MG capsule Take 200 mg by mouth every evening.    Yes [provider]  febuxostat (ULORIC) 40 MG tablet Take 40 mg by mouth every evening. 12/26/17  Yes [provider]  fexofenadine (ALLEGRA) 180 MG tablet Take 180 mg by mouth daily as needed for allergies.    Yes [provider]  fluticasone (FLONASE) 50 MCG/ACT nasal spray Place 2 sprays into both nostrils daily as needed for allergies.  12/30/17  Yes [provider]  HYDROcodone-acetaminophen (NORCO) 7.5-325 MG tablet Take 1 tablet by mouth 3 (three) times daily as needed. 02/18/18  Yes [provider]  levothyroxine (SYNTHROID, LEVOTHROID) 112 MCG tablet Take 112 mcg by mouth daily.   Yes [provider]  metFORMIN (GLUCOPHAGE-XR) 500 MG 24 hr tablet Take 1,000 mg by mouth every evening.  06/26/17  Yes [provider]  metoprolol succinate (TOPROL-XL) 50 MG 24 hr tablet Take 50 mg by mouth daily. 12/30/17  Yes [provider]  montelukast (SINGULAIR) 10 MG tablet Take 10 mg by mouth daily as needed (allergies).    Yes [provider]  olopatadine  (PATANOL) 0.1 % ophthalmic solution Place 1 drop into both eyes daily as needed for allergies.  12/30/17  Yes [provider]  pantoprazole (PROTONIX) 40 MG tablet Take 1 tablet (40 mg total) by mouth daily. 02/09/14  Yes Hart CarwinBrodie, Dora M, MD  senna (SENOKOT) 8.6 MG tablet Take 1 tablet by mouth daily as needed for constipation.   Yes [provider]  tiZANidine (ZANAFLEX) 4 MG capsule Take 4 mg by mouth 2 (two) times daily as needed for muscle spasms.    Yes [provider]  tobramycin-dexamethasone (TOBRADEX) ophthalmic ointment Place 1 application into both eyes 2 (two) times daily.   Yes [provider]  triamcinolone cream (KENALOG) 0.1 % Apply 1 application topically daily as needed (ezcema).  05/29/17  Yes [provider]     Vital Signs: BP (!) 152/66    Pulse 93    Temp 98.9 F (37.2 C) (Oral)    Resp 16    Ht 5\' 2"  (1.575 m)    Wt 242 lb 11.6 oz (110.1 kg)    LMP  (LMP Unknown)    SpO2 93%    BMI 44.40 kg/m   Physical Exam left abd (IR) drain- 10 fr intact, dressing clean and dry, site mildly tender, output 35+ cc beige colored fluid  Imaging: Ct Abdomen Pelvis W Contrast  Result Date: 08/08/2018 CLINICAL DATA:  Postoperative abdominal pain, foul-smelling discharge, JP drain, wound VAC EXAM: CT ABDOMEN AND PELVIS WITH CONTRAST TECHNIQUE: Multidetector CT imaging of the abdomen and  pelvis was performed using the standard protocol following bolus administration of intravenous contrast. CONTRAST:  125mL OMNIPAQUE IOHEXOL 300 MG/ML SOLN, 31mL OMNIPAQUE IOHEXOL 300 MG/ML SOLN, additional oral enteric contrast COMPARISON:  07/29/2018 FINDINGS: Lower chest: Moderate left, small right pleural effusions and associated atelectasis or consolidation. Hepatobiliary: No focal liver abnormality is seen. Status post cholecystectomy. No biliary dilatation. Pancreas: Unremarkable. No pancreatic ductal dilatation or surrounding inflammatory changes. Spleen: Normal  in size without significant abnormality. Adrenals/Urinary Tract: Adrenal glands are unremarkable. Kidneys are normal, without renal calculi, solid lesion, or hydronephrosis. Bladder is unremarkable. Stomach/Bowel: Stomach is within normal limits. Status post colectomy with right lower quadrant end ostomy. Vascular/Lymphatic: Aortic atherosclerosis. No enlarged abdominal or pelvic lymph nodes. Reproductive: No mass or other significant abnormality. Other: Surgical drain is positioned about the low abdomen, tip in the right lower quadrant. Small volume loculated appearing perisplenic ascites similar to prior (series 2, image 23). Loculated appearing air and fluid collection about the anterior left hemiabdomen adjacent to loops of small bowel measuring approximately 15.7 x 7.7 x 4.8 cm (series 2, image 50, series 6, image 58). Musculoskeletal: No acute or significant osseous findings. IMPRESSION: 1. Loculated appearing air and fluid collection about the anterior left hemiabdomen adjacent to loops of small bowel measuring approximately 15.7 x 7.7 x 4.8 cm (series 2, image 50, series 6, image 58), highly suspicious for abscess. 2. Small volume loculated appearing ascites. 3. Surgical drain in the low abdomen, status post colectomy with right lower quadrant end ostomy. 4.  Pleural effusions. Electronically Signed   By: Eddie Candle [name]   On: 08/08/2018 20:19   Ct Image Guided Drainage By Percutaneous Catheter  Result Date: 08/10/2018 INDICATION: 78 year old female with a history abdominal abscess EXAM: CT GUIDED DRAINAGE OF ABSCESS MEDICATIONS: The patient is currently admitted to the hospital and receiving intravenous antibiotics. The antibiotics were administered within an appropriate time frame prior to the initiation of the procedure. ANESTHESIA/SEDATION: 1.0 mg IV Versed 50 mcg IV Fentanyl Moderate Sedation Time:  24 The patient was continuously monitored during the procedure by the interventional radiology  nurse under my direct supervision. COMPLICATIONS: None TECHNIQUE: Informed written consent was obtained from the patient after a thorough discussion of the procedural risks, benefits and alternatives. All questions were addressed. Maximal Sterile Barrier Technique was utilized including caps, mask, sterile gowns, sterile gloves, sterile drape, hand hygiene and skin antiseptic. A timeout was performed prior to the initiation of the procedure. PROCEDURE: The operative field was prepped with chlorhexidine in a sterile fashion, and a sterile drape was applied covering the operative field. A sterile gown and sterile gloves were used for the procedure. Local anesthesia was provided with 1% Lidocaine. Once a scout CT was acquired of the abdomen, we targeted the air in fluid collection in the left abdomen. The patient was prepped and draped in the usual sterile fashion. 1% lidocaine was used for local anesthesia. Using CT guidance, modified Seldinger technique was used to place a 10 [place] drain into the fluid and gas collection within the left abdomen. Frankly purulent material was aspirated. Catheter was sutured in position and attached to bulb suction. Patient tolerated the procedure well and remained hemodynamically stable throughout. No complications were encountered and no significant blood loss. FINDINGS: Scout CT demonstrates surgical changes with persisting fluid and gas collection in the left abdomen. This was the collection targeted. Images demonstrate safe placement of 10 French drain into the fluid collection from left anterior approach. IMPRESSION: Status post CT-guided drainage  of left abdominal abscess. Signed, Yvone Neu. Reyne Dumas, RPVI [facility] Radiology Electronically Signed   By: Gilmer Mor D.O.   On: 08/10/2018 08:25    Labs:  CBC: Recent Labs    08/08/18 1544 08/09/18 0455 08/10/18 0338 08/11/18 0422  WBC 10.0 11.4* 8.7 7.6  HGB 7.7*  7.7* 7.0* 6.6*  HCT 25.8* 25.4* 23.8* 22.5*  PLT 392 404* 418* 369    COAGS: No results for input(s): INR, APTT in the last 8760 hours.  BMP: Recent Labs    08/08/18 0449 08/09/18 0455 08/10/18 0338 08/11/18 0422  NA 143 142 139 142  K 4.9 5.0 4.4 4.5  CL 115* 107 112* 115*  CO2 22 19* 19* 21*  GLUCOSE 111* 91 124* 110*  BUN 55* 47* 40* 36*  CALCIUM 8.3* 8.8* 8.2* 8.1*  CREATININE 0.99 1.00 1.10* 1.11*  GFRNONAA 54* 53* 47* 47*  GFRAA >60 >60 55* 54*    LIVER FUNCTION TESTS: Recent Labs    07/28/18 0352 07/31/18 0500 08/04/18 0430 08/07/18 0408  BILITOT 0.7 0.2* 0.5 0.2*  AST 26 23 35 37  ALT 20 13 33 33  ALKPHOS 92 51 99 111  PROT 4.4* 4.1* 5.4* 5.2*  ALBUMIN 1.7* 1.3* 1.7* 1.6*    Assessment and Plan: Pt with hx septic shock, ischemic colitis, acute cholecystitis POD #19,S/pExploratory laparotomy, total abdominal colectomy, [name], cholecystectomy-6/10-Dr. Derrell Lolling; s/p left abd abscess drain placement 6/27; afebrile; WBC nl; hgb 6.6 (has had 1 unit PRBC), creat 1.11, drain fluid cx- enterococcus; cont current tx; monitor labs closely; check f/u CT once drain output declines    Electronically Signed: D. Jeananne Rama, PA-C 08/11/2018, 4:42 PM   I spent a total of 15 minutes  at the the patient's bedside AND on the patient's hospital floor or unit, greater than 50% of which was counseling/coordinating care for abdominal abscess drain    Patient ID: Hailey Hughes, female   DOB: 1940/07/09, 78 y.o.   MRN: [id number]

## 2018-08-11 NOTE — Progress Notes (Signed)
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 Her condition rapidly deteriorated needing transfer to ICU on day of admission for persistent hypotension and lactic acidosis with no response with aggressive IV fluid resuscitation and consistent with septic shock as she ultimately required pressor support, intubation for acute proximal respiratory failure, acute renal failure.Patient underwent exploratory laparotomy on 6/10 to progressively worsening abdominal exam concerning for peritonitis and was found to have ischemic colitis requiring abdominal colectomy, ileostomy placement and cholecystectomy.   Hospital course complicated by prolonged weaning of pressor support, concern for aspiration given hypoxia and elevated requirement (6/14), and change in mentation though CT head at that time remained negative attributed to Dilaudid.  Most recently found to have fluid collection in her abdomen.  Underwent aspiration and drain placement by interventional radiology on 6/27.  6/29: No acute events overnight, tolerating soft diet.  Subjective: Seen this morning.  No new complaints.  Tolerating soft diet, having bowel movement on the colostomy.  chronic abdominal discomfort/pain still present.  Drain with pus present x2.   Assessment & Plan:  Ischemic colitis-gallbladder status post total abdominal colectomy, [name], cholecystectomy 6/10: Surgical site is clean and healing well.  Being followed by general surgery and wound care who is changing  dressing to saline moist dressing for few days to evaluate the wound closure .JP drainx2 place with puslike discharge.  Now patient is on oral diet continue dietitian follow up, cont supplement. TPN weaned off.  Monitor pain control-remains on his scheduled Tylenol, PRN IV Dilaudid and oxycodone.  Foul smelling Vac output and CT abd was done- showed loculated fluid collection concerning for abscess in the left hemiabdomen and small volume ascites s/p aspiration and drain placement by IR on 6/27 AND Zosyn started for 5 days on 6/27.  Wound culture growing Enterococcus faecalis, gram-negative rods, gram-positive cocci.  Neurovascularly stable at 7600 and afebrile.  Anemia of chronic disease hemoglobin down to 8 to 6.6 g on 7.2 g, ordered 1 unit PRBC this morning, monitor CBC posttransfusion in the morning.  No acute blood loss noted.Iron panel consistent with anemia of chronic disease, no acute blood loss, closely monitor CBC.  Staying between 7 and 8, most part Recent Labs  Lab 08/05/18 0830 08/08/18 1544 08/09/18 0455 08/10/18 0338 08/11/18 0422  HGB 8.2* 7.7* 7.7* 7.0* 6.6*  HCT 28.1* 25.8* 25.4* 23.8* 22.5*    Moderate protein calorie malnutrition in patient with morbid obesity.  Secondary to patient's abdominal surgery/poor absorption.Prealbumin 11.5. OFF TPN AND ON DIET, , ON calorie count. Meets criteria for morbid obesity with BMI greater than 40.  Continue to augment nutrition.  Septic shock, resolved. Likely bacterial translocation from ischemic colitis, no longer requiring pressor support, completed Zosyn (6/9-6/13), Rocephin 6/13-6/16), meropenem continue antibiotics for # 2.  Acute hypoxic respiratory failure, resolved.on RA. she was Intubated on 6/10, extubated on 6/15. Suspected aspiration pneumonia (right lower lobe consolidation on imaging) as well as severe sepsis from colitis.  Encourage ambulation//IS  Hyperkalemia: resolved.  AKI on CKD, Stage 3 resolved.Peak  creatinine of 3. AKI from ATN. baseline 1.4-1.6(care everywhere) she is no stable, creat  at 1.1.Avoiding nephrotoxins   Hyperphosphatemia.Unclear etiology as kidney function seems stable, repeat lab shows resolution  Type 2 diabetes. Stable.continue sliding scale.  Hypothyroidism continue Synthroid   Deconditioning, continue to work with PT/OT recs SNF ibnce medically stable.  Pressure injury of sacrum, not present on admission.Skin intact, continue local care, continue offloading.  Bilateral ankle pain:Uric acid level normal.  Continue pain control.  DVT prophylaxis: SCD Code Status: FULL Family Communication: called son's no no answer x2-no option to leave msg. Will reattempt later. Disposition Plan: remains inpatient pending clinical improvement, pending completion of IV antibiotics and once oral intake is adequate with calorie count.  Consultants:  Critical care  General surgery  Interventional radiology  Procedures:  Exploratory laparotomy  Aspiration of residual fluid plus drain placement 6/27  Antimicrobials:  IV Zosyn 6/9-6/13, restarted 6/27-7/1  IV Rocephin 6/13-6/16  IV meropenem completed Anti-infectives (From admission, onward)   Start     Dose/Rate Route Frequency Ordered Stop   08/09/18 0900  piperacillin-tazobactam (ZOSYN) IVPB 3.375 g     3.375 g 12.5 mL/hr over 240 Minutes Intravenous Every 8 hours 08/09/18 0822     08/06/18 1400  fluconazole (DIFLUCAN) tablet 150 mg     150 mg Oral Daily 08/06/18 1205 08/08/18 0941   07/29/18 0600  meropenem (MERREM) 1 g in sodium chloride 0.9 % 100 mL IVPB  Status:  Discontinued     1 g 200 mL/hr over 30 Minutes Intravenous Every 12 hours 07/29/18 0457 08/03/18 1048   07/29/18 0500  ceFEPIme (MAXIPIME) 2 g in sodium chloride 0.9 % 100 mL IVPB  Status:  Discontinued     2 g 200 mL/hr over 30 Minutes Intravenous Every 24 hours 07/29/18 0436 07/29/18 0457   07/26/18 1400  piperacillin-tazobactam (ZOSYN)  IVPB 3.375 g  Status:  Discontinued     3.375 g 12.5 mL/hr over 240 Minutes Intravenous Every 6 hours 07/26/18 0906 07/26/18 1339   07/26/18 1400  cefTRIAXone (ROCEPHIN) 1 g in sodium chloride 0.9 % 100 mL IVPB  Status:  Discontinued     1 g 200 mL/hr over 30 Minutes Intravenous Every 24 hours 07/26/18 1339 07/29/18 0436   07/23/18 0200  piperacillin-tazobactam (ZOSYN) IVPB 2.25 g  Status:  Discontinued     2.25 g 100 mL/hr over 30 Minutes Intravenous Every 6 hours 07/22/18 2248 07/26/18 0906   07/22/18 1800  piperacillin-tazobactam (ZOSYN) IVPB 3.375 g  Status:  Discontinued     3.375 g 12.5 mL/hr over 240 Minutes Intravenous Every 8 hours 07/22/18 0953 07/22/18 2248   07/22/18 1000  piperacillin-tazobactam (ZOSYN) IVPB 3.375 g     3.375 g 100 mL/hr over 30 Minutes Intravenous  Once 07/22/18 0953 07/22/18 1239   07/22/18 0745  ciprofloxacin (CIPRO) IVPB 400 mg     400 mg 200 mL/hr over 60 Minutes Intravenous  Once 07/22/18 0739 07/22/18 0921   07/22/18 0745  metroNIDAZOLE (FLAGYL) IVPB 500 mg     500 mg 100 mL/hr over 60 Minutes Intravenous  Once 07/22/18 0739 07/22/18 1024       Objective: Vitals:   08/11/18 0500 08/11/18 1230 08/11/18 1300 08/11/18 1305  BP: 134/82 (!) 156/61 (!) 138/57 (!) 138/57  Pulse: 88 91 99 99  Resp: 12 16 16 16   Temp: 97.6 F (36.4 C) 98.3 F (36.8 C) 98.2 F (36.8 C) 98.2 F (36.8 C)  TempSrc: Oral Oral  Oral  SpO2: 94% 99%  96%  Weight:      Height:  Intake/Output Summary (Last 24 hours) at 08/11/2018 1400 Last data filed at 08/11/2018 1145 Gross per 24 hour  Intake 665.06 ml  Output 2245 ml  Net -1579.94 ml   Filed Weights   07/30/18 0910 07/31/18 1016 08/07/18 0551  Weight: 112.3 kg 107.2 kg 110.1 kg   Weight change:   Body mass index is 44.4 kg/m.  Intake/Output from previous day: 06/28 0701 - 06/29 0700 In: 1395 [P.O.:1200; IV Piggyback:150] Out: 2945 [Urine:1650; Drains:395; Stool:900] Intake/Output this shift: Total  I/O In: 50 [P.O.:50] Out: -   Examination:  General exam: Appears calm and comfortable alert awake,chronically sick looking, older than stated age. HEENT:PERRL,Oral mucosa moist, Ear/Nose normal on gross exam.  Respiratory system: B/L clear, diminished at base  Cardiovascular system: S1 & S2 heard,No JVD, murmurs. Gastrointestinal system: Abdomen is  Soft, ileostomy + with a stool, drains present on the left abdomen x2 with pus, midline incision with intact dressing, did not change the dressing, addressed by surgery. BS+.Wound vac is off. Nervous System:Alert and oriented. No focal neurological deficits/moving extremities, sensation intact. Extremities: No edema, no clubbing, distal peripheral pulses palpable. Skin: No rashes, lesions, no icterus MSK: Normal muscle bulk,tone ,power  Medications:  Scheduled Meds:  sodium chloride   Intravenous Once   acetaminophen  1,000 mg Oral Q6H   Chlorhexidine Gluconate Cloth  6 each Topical Daily   feeding supplement (ENSURE ENLIVE)  237 mL Oral TID BM   feeding supplement (PRO-STAT SUGAR FREE 64)  30 mL Oral TID   insulin aspart  0-15 Units Subcutaneous TID AC & HS   levothyroxine  112 mcg Oral Q0600   mouth rinse  15 mL Mouth Rinse BID   nystatin  5 mL Oral QID   nystatin   Topical QODAY   olopatadine  1 drop Both Eyes BID   pantoprazole  40 mg Oral QHS   sodium chloride flush  10-40 mL Intracatheter Q12H   sodium chloride flush  5 mL Intracatheter Q8H   tobramycin-dexamethasone  1 application Both Eyes BID   Continuous Infusions:  sodium chloride 250 mL (08/11/18 0037)   piperacillin-tazobactam (ZOSYN)  IV 3.375 g (08/11/18 1008)    Data Reviewed: I have personally reviewed following labs and imaging studies  CBC: Recent Labs  Lab 08/05/18 0830 08/08/18 1544 08/09/18 0455 08/10/18 0338 08/11/18 0422  WBC 11.4* 10.0 11.4* 8.7 7.6  NEUTROABS  --   --  8.6* 6.4 4.8  HGB 8.2* 7.7* 7.7* 7.0* 6.6*  HCT 28.1*  25.8* 25.4* 23.8* 22.5*  MCV 94.9 95.9 93.7 93.0 93.4  PLT 340 392 404* 418* 369   Basic Metabolic Panel: Recent Labs  Lab 08/05/18 0632 08/06/18 0253 08/07/18 0408 08/08/18 0449 08/09/18 0455 08/10/18 0338 08/11/18 0422  NA 142 142 141 143 142 139 142  K 5.2* 5.2* 5.1 4.9 5.0 4.4 4.5  CL 115* 115* 115* 115* 107 112* 115*  CO2 20* 22 22 22  19* 19* 21*  GLUCOSE 72 182* 154* 111* 91 124* 110*  BUN 70* 66* 65* 55* 47* 40* 36*  CREATININE 1.22* 1.15* 1.28* 0.99 1.00 1.10* 1.11*  CALCIUM 8.2* 8.1* 8.2* 8.3* 8.8* 8.2* 8.1*  MG  --  2.1 2.1  --   --   --   --   PHOS 4.2 3.8 5.0* 3.8  --   --   --    GFR: Estimated Creatinine Clearance: 47.3 mL/min (A) (by C-G formula based on SCr of 1.11 mg/dL (H)). Liver Function Tests: Recent  Labs  Lab 08/07/18 0408  AST 37  ALT 33  ALKPHOS 111  BILITOT 0.2*  PROT 5.2*  ALBUMIN 1.6*   No results for input(s): LIPASE, AMYLASE in the last 168 hours. No results for input(s): AMMONIA in the last 168 hours. Coagulation Profile: No results for input(s): INR, PROTIME in the last 168 hours. Cardiac Enzymes: No results for input(s): CKTOTAL, CKMB, CKMBINDEX, TROPONINI in the last 168 hours. BNP (last 3 results) No results for input(s): PROBNP in the last 8760 hours. HbA1C: No results for input(s): HGBA1C in the last 72 hours. CBG: Recent Labs  Lab 08/10/18 1139 08/10/18 1631 08/10/18 2115 08/11/18 0750 08/11/18 1145  GLUCAP 168* 142* 171* 108* 172*   Lipid Profile: No results for input(s): CHOL, HDL, LDLCALC, TRIG, CHOLHDL, LDLDIRECT in the last 72 hours. Thyroid Function Tests: No results for input(s): TSH, T4TOTAL, FREET4, T3FREE, THYROIDAB in the last 72 hours. Anemia Panel: No results for input(s): VITAMINB12, FOLATE, FERRITIN, TIBC, IRON, RETICCTPCT in the last 72 hours. Sepsis Labs: No results for input(s): PROCALCITON, LATICACIDVEN in the last 168 hours.  Recent Results (from the past 240 hour(s))  Aerobic/Anaerobic  Culture (surgical/deep wound)     Status: None (Preliminary result)   Collection Time: 08/09/18  2:52 PM   Specimen: Abscess  Result Value Ref Range Status   Specimen Description   Final    ABSCESS Performed at [facility] [address], [place], [place] [address]    Special Requests   Final    NONE Performed at [facility], [place] [address], [place], [place] [address]    Gram Stain   Final    ABUNDANT WBC PRESENT, PREDOMINANTLY PMN ABUNDANT GRAM POSITIVE COCCI IN PAIRS ABUNDANT GRAM NEGATIVE RODS    Culture   Final    ABUNDANT ENTEROCOCCUS FAECALIS SUSCEPTIBILITIES TO FOLLOW Performed at [facility], [place] [address], [place], [place] [address]    Report Status PENDING  Incomplete      Radiology Studies: Ct Image Guided Drainage By Percutaneous Catheter  Result Date: 08/10/2018 INDICATION: 78 year old female with a history abdominal abscess EXAM: CT GUIDED DRAINAGE OF ABSCESS MEDICATIONS: The patient is currently admitted to the hospital and receiving intravenous antibiotics. The antibiotics were administered within an appropriate time frame prior to the initiation of the procedure. ANESTHESIA/SEDATION: 1.0 mg IV Versed 50 mcg IV Fentanyl Moderate Sedation Time:  24 The patient was continuously monitored during the procedure by the interventional radiology nurse under my direct supervision. COMPLICATIONS: None TECHNIQUE: Informed written consent was obtained from the patient after a thorough discussion of the procedural risks, benefits and alternatives. All questions were addressed. Maximal Sterile Barrier Technique was utilized including caps, mask, sterile gowns, sterile gloves, sterile drape, hand hygiene and skin antiseptic. A timeout was performed prior to the initiation of the procedure. PROCEDURE: The operative field was prepped with chlorhexidine in a sterile fashion, and a sterile drape was applied covering the operative field. A  sterile gown and sterile gloves were used for the procedure. Local anesthesia was provided with 1% Lidocaine. Once a scout CT was acquired of the abdomen, we targeted the air in fluid collection in the left abdomen. The patient was prepped and draped in the usual sterile fashion. 1% lidocaine was used for local anesthesia. Using CT guidance, modified Seldinger technique was used to place a 10 [place] drain into the fluid and gas collection within the left abdomen. Frankly purulent material was aspirated. Catheter was sutured in  position and attached to bulb suction. Patient tolerated the procedure well and remained hemodynamically stable throughout. No complications were encountered and no significant blood loss. FINDINGS: Scout CT demonstrates surgical changes with persisting fluid and gas collection in the left abdomen. This was the collection targeted. Images demonstrate safe placement of 10 French drain into the fluid collection from left anterior approach. IMPRESSION: Status post CT-guided drainage of left abdominal abscess. Signed, Yvone Neu. Reyne Dumas, RPVI [facility] Electronically Signed   By: Gilmer Mor D.O.   On: 08/10/2018 08:25      LOS: 20 days   Time spent: More than 50% of that time was spent in counseling and/or coordination of care.  Lanae Boast, MD [facility]  08/11/2018, 2:00 PM

## 2018-08-11 NOTE — Progress Notes (Addendum)
19 Days Post-Op    CC: Acute abdominal pain with peritonitis  Subjective: Patient is in bed and is tolerating a soft diet.  She is on a calorie count.  Her p.o. intake is motivated more by need to eat rather than appetite.  Her midline wound had a wound VAC taken down.  There is some purulent drainage kind of at the base.  There is some tracking of the wound.  Regardless change her over to wet-to-dry's.  Ostomy is working well.  Both JP drains have milky whitish-brown fluid coming from both.  Objective: Vital signs in last 24 hours: Temp:  [97.6 F (36.4 C)-99.3 F (37.4 C)] 97.6 F (36.4 C) (06/29 0500) Pulse Rate:  [88-98] 88 (06/29 0500) Resp:  [12-16] 12 (06/29 0500) BP: (134-152)/(59-82) 134/82 (06/29 0500) SpO2:  [94 %-96 %] 94 % (06/29 0500) Last BM Date: 08/10/18 1200 p.o. recorded Left lower quadrant pain 35 Surgical drain 10 Urine 1650 350 through the wound VAC -from purulent appearing fluid 900 via ileostomy Afebrile vital signs are stable. Creatinine 1.11, chloride 115, WBC 7.6, Hemoglobin 6.6/hematocrit 22.5. Being transfused today CT scan 6/26 shows a 15.7 x 7.7 x 4.8 cm loculated air-fluid collection left anterior hemi-abdomen -IR drain placement 08/09/2018 Gram stain shows abundant WBC gram-positive cocci gram-negative rods/abundant Enterococcus faecalis 08/09/2018  Intake/Output from previous day: 06/28 0701 - 06/29 0700 In: [place] [P.O.:1200; IV Piggyback:150] Out: 7846 [Urine:1650; Drains:395; Stool:900] Intake/Output this shift: Total I/O In: 50 [P.O.:50] Out: -   General appearance: alert, cooperative and no distress Resp: clear to auscultation bilaterally GI: Midline incision is soupy at the base with some tracking.  The majority of the wound looks pretty good.  The 2 drains are both putting out milky white-brownish colored fluid.  Ileostomy is working well.   Lab Results:  Recent Labs    08/10/18 0338 08/11/18 0422  WBC 8.7 7.6  HGB 7.0* 6.6*   HCT 23.8* 22.5*  PLT 418* 369    BMET Recent Labs    08/10/18 0338 08/11/18 0422  NA 139 142  K 4.4 4.5  CL 112* 115*  CO2 19* 21*  GLUCOSE 124* 110*  BUN 40* 36*  CREATININE 1.10* 1.11*  CALCIUM 8.2* 8.1*   PT/INR No results for input(s): LABPROT, INR in the last 72 hours.  Recent Labs  Lab 08/07/18 0408  AST 37  ALT 33  ALKPHOS 111  BILITOT 0.2*  PROT 5.2*  ALBUMIN 1.6*     Lipase     Component Value Date/Time   LIPASE 12 07/25/2018 0500     Medications: . sodium chloride   Intravenous Once  . acetaminophen  1,000 mg Oral Q6H  . Chlorhexidine Gluconate Cloth  6 each Topical Daily  . feeding supplement (ENSURE ENLIVE)  237 mL Oral TID BM  . feeding supplement (PRO-STAT SUGAR FREE 64)  30 mL Oral TID  . insulin aspart  0-15 Units Subcutaneous TID AC & HS  . levothyroxine  112 mcg Oral Q0600  . mouth rinse  15 mL Mouth Rinse BID  . nystatin  5 mL Oral QID  . nystatin   Topical QODAY  . olopatadine  1 drop Both Eyes BID  . pantoprazole  40 mg Oral QHS  . sodium chloride flush  10-40 mL Intracatheter Q12H  . sodium chloride flush  5 mL Intracatheter Q8H  . tobramycin-dexamethasone  1 application Both Eyes BID   . sodium chloride 250 mL (08/11/18 0037)  . piperacillin-tazobactam (ZOSYN)  IV 3.375 g (08/11/18 1008)   . sodium chloride 250 mL (08/11/18 0037)  . piperacillin-tazobactam (ZOSYN)  IV 3.375 g (08/11/18 1008)   Assessment/Plan RLL pneumonia - Rx with Merrem  Completed VDRF,resolved Type 2 diabetes Hypothyroid - on Synthroid Rheumatoid arthritis OSA Morbid obesity BMI 44 AKI - Cr trending down Malnutrition - prealbumin11.5(08/04/18) -nutrition consult/calorie count Klebsiella UTI, resolved Anemia - being transfused 6/29 Thrombocytopenia -resolved  Septic shock Ischemic colitis, acute cholecystitis POD #19,S/pExploratory laparotomy, total abdominal colectomy, [name], cholecystectomy-6/10-Dr. Derrell Lolling - CT scan 6/26:  Loculated air-fluid collection anterior left hemidiaphragm adjacent small bowel measuring 15.7 x 7.7 x 4.8 cm - s/p Ir drain 08/09/18 -Midline wound VAC discontinued 08/11/2018 -wet-to-dry TID - Having decent bowel function currently. -Onsoft diet, TNA discontinued poor oral intake on calorie count   -prealbumin 11.5 - concern for rectal stump leak   ID -Meropenem completed, Diflucan 6/24-6/26; Mycostatin oral6/23>> day 7; 6/26 restart zosyn >> day 3 FEN -IVF,TNA discontinued, soft diet - calorie count in progress VTE -SCDs/anemia  Follow up -Dr. Marnette Burgess - son Onalee Hua [phone] 6/23), husband Henreitta Cea [phone]   Plan: Despite all the issues occurring patient looks fairly comfortable.  Continue IR and surgical drain.  Continue IV antibiotics.  Patient says she has only been to the side of the bed.  Will get OT and PT to see her.  She lives at home with her husband but she does not think he will be able to help her get out of bed.    LOS: 20 days    Glenyce Randle 08/11/2018 [phone]

## 2018-08-11 NOTE — Progress Notes (Addendum)
Lillyonna Christina Hgb 6.6 this am. [employer],  [place] notified, new orders obtained for transfusion of PRBC"S. Marvis Moeller, on-coming staff nurse informed.

## 2018-08-11 NOTE — Progress Notes (Signed)
CRITICAL VALUE ALERT  Critical Value: Hgb 6.6  Date & Time Notied:08/11/2018 at 0625  Provider Notified: [facility]  Orders Received/Actions taken: Pending returned paged  This am lab passed to on-coming dayshift nurse

## 2018-08-11 NOTE — Consult Note (Signed)
[place] Nurse wound follow up Wound type:surgical Measurement:20cm x 5cm x 3.5cm with tunneling at 12 oclock that is 5.5cm  Wound bed:75% clean/25% slough wound bed and base Drainage (amount, consistency, odor) moderate, brown, has odor Periwound:improved MARSI is healing  Dressing procedure/placement/frequency: Will Jennings at the bedside to assess wound, drainage is thick and has odor with pocket of thick drainage in the proximal end of the wound with the 5.5cm tunnel Agreed to change to saline moist dressing for a few days to evaluate the wound closer and clean it up possibly. Wick the tunneled area as well.  Explained this to patient and rationale for change.  Patient received IV/ pain  medication per bedside nurse prior to dressing change Patient tolerated procedure well Saline moist 4x4s used to pack into tunneled area and fill remainder of the wound bed, topped with ABD pads, secured with tape.    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 She is learning slowly. She ask appropriate questions. We really talked in details about the difference in a colostomy and an ileostomy. Risk of dehydration. Importance of protein for wound healing  Enrolled patient in [place] Discharge program: Yes  Explained to patient that we would continue to follow along for support with her ostomy care. The bedside nurses will be changing the abdominal dressing.   [place] Nurse will follow along with you for continued support with ostomy teaching and care Derotha Fishbaugh [facility], RN, [place], CNS,  [place]   [place] Nurse will follow along with you for continued support with ostomy teaching and care [place] MSN, RN, [place], [place], [place]

## 2018-08-11 NOTE — Progress Notes (Signed)
PT Cancellation Note  Patient Details Name: AKEIRA LAHM MRN: [id number] DOB: 20-Mar-1940   Cancelled Treatment:    Reason Eval/Treat Not Completed: Medical issues which prohibited therapy   Weston Anna, PT Acute Rehabilitation Services Pager: [phone] Office: [phone]

## 2018-08-12 LAB — AEROBIC/ANAEROBIC CULTURE W GRAM STAIN (SURGICAL/DEEP WOUND)

## 2018-08-12 LAB — TYPE AND SCREEN
ABO/RH(D): A POS
Antibody Screen: NEGATIVE
Unit division: 0

## 2018-08-12 LAB — COMPREHENSIVE METABOLIC PANEL
ALT: 40 U/L (ref 0–44)
AST: 37 U/L (ref 15–41)
Albumin: 1.7 g/dL — ABNORMAL LOW (ref 3.5–5.0)
Alkaline Phosphatase: 109 U/L (ref 38–126)
Anion gap: 8 (ref 5–15)
BUN: 26 mg/dL — ABNORMAL HIGH (ref 8–23)
CO2: 20 mmol/L — ABNORMAL LOW (ref 22–32)
Calcium: 8.1 mg/dL — ABNORMAL LOW (ref 8.9–10.3)
Chloride: 111 mmol/L (ref 98–111)
Creatinine, Ser: 1.08 mg/dL — ABNORMAL HIGH (ref 0.44–1.00)
GFR calc Af Amer: 56 mL/min — ABNORMAL LOW (ref >60)
GFR calc non Af Amer: 48 mL/min — ABNORMAL LOW (ref >60)
Glucose, Bld: 118 mg/dL — ABNORMAL HIGH (ref 70–99)
Potassium: 4.1 mmol/L (ref 3.5–5.1)
Sodium: 139 mmol/L (ref 135–145)
Total Bilirubin: 0.6 mg/dL (ref 0.3–1.2)
Total Protein: 5.4 g/dL — ABNORMAL LOW (ref 6.5–8.1)

## 2018-08-12 LAB — CBC
HCT: 26.4 % — ABNORMAL LOW (ref 36.0–46.0)
Hemoglobin: 8.2 g/dL — ABNORMAL LOW (ref 12.0–15.0)
MCH: 28.4 pg (ref 26.0–34.0)
MCHC: 31.1 g/dL (ref 30.0–36.0)
MCV: 91.3 fL (ref 80.0–100.0)
Platelets: 341 10*3/uL (ref 150–400)
RBC: 2.89 MIL/uL — ABNORMAL LOW (ref 3.87–5.11)
RDW: 14.7 % (ref 11.5–15.5)
WBC: 8.3 10*3/uL (ref 4.0–10.5)
nRBC: 0 % (ref 0.0–0.2)

## 2018-08-12 LAB — BPAM RBC
Blood Product Expiration Date: 202007142359
ISSUE DATE / TIME: 202006291242
Unit Type and Rh: 6200

## 2018-08-12 LAB — GLUCOSE, CAPILLARY
Glucose-Capillary: 105 mg/dL — ABNORMAL HIGH (ref 70–99)
Glucose-Capillary: 102 mg/dL — ABNORMAL HIGH (ref 70–99)
Glucose-Capillary: 126 mg/dL — ABNORMAL HIGH (ref 70–99)
Glucose-Capillary: 119 mg/dL — ABNORMAL HIGH (ref 70–99)

## 2018-08-12 LAB — PREALBUMIN: Prealbumin: 10.7 mg/dL — ABNORMAL LOW (ref 18–38)

## 2018-08-12 MED ORDER — SODIUM CHLORIDE 0.9 % IV SOLN
3.0000 g | Freq: Four times a day (QID) | INTRAVENOUS | Status: DC
  Administered 2018-08-12 – 2018-08-20 (×31): 3 g via INTRAVENOUS
  Filled 2018-08-12 (×7): qty 3
  Filled 2018-08-12: qty 8
  Filled 2018-08-12 (×5): qty 3
  Filled 2018-08-12 (×2): qty 8
  Filled 2018-08-12 (×17): qty 3

## 2018-08-12 NOTE — Progress Notes (Signed)
Occupational Therapy Treatment Patient Details Name: TOCARRA GASSEN MRN: [id number] DOB: 04/21/1940 Today's Date: 08/12/2018    History of present illness 78 year old white female admitted the hospital 6/9 with CT scan evidence of colitis transferred to the ICU  for septic shock, s/p colectomy and cholecystectomy 07/23/18   OT comments  Pt was able to get up to chair today via lateral scoot, max +2 A.  Self feeding with full set up and built up foam   Follow Up Recommendations  SNF    Equipment Recommendations  None recommended by OT    Recommendations for Other Services      Precautions / Restrictions Precautions Precautions: Fall Precaution Comments: multiple lines, JP drains on L,  and PICC RUE, abdominal wound VAC, flexiseal Restrictions Weight Bearing Restrictions: No       Mobility Bed Mobility Overal bed mobility: Needs Assistance Bed Mobility: Rolling;Sidelying to Sit Rolling: +2 for physical assistance;Mod assist Sidelying to sit: Max assist;+2 for physical assistance       General bed mobility comments: Mod assist +2 for rolling to side with use of bed pads, LE management, pt with use of bed rails to complete roll. Max assist +2 for sidelying to sit for trunk elevation, LE management, and scooting to EOB with use of bed pad.  Transfers Overall transfer level: Needs assistance Equipment used: Rolling walker (2 wheeled) Transfers: Sit to/from Stand;Lateral/Scoot Transfers Sit to Stand: Total assist;+2 physical assistance;+2 safety/equipment;From elevated surface        Lateral/Scoot Transfers: Max assist;+2 physical assistance General transfer comment: Attempted sit to stand, required total assist +2 and unable to extend knees/hips to come to standing. PT and OT decided on scoot pivot to drop arm recliner, requiring max assist +2 for translation of hips with bed pads and assist in positioning in recliner.    Balance Overall balance assessment: Needs  assistance Sitting-balance support: Feet supported;Bilateral upper extremity supported Sitting balance-Leahy Scale: Poor Sitting balance - Comments: able to sit independently for 30 seconds with UE support, pt with posterior leaning with fatigue requiring PT/OT assist to correct Postural control: Posterior lean                                 ADL either performed or assessed with clinical judgement   ADL   Eating/Feeding: Set up;Sitting;With adaptive utensils                       Toilet Transfer: Maximal assistance;+2 for physical assistance(lateral scoot to drop arm recliner)                   Vision       Perception     Praxis      Cognition Arousal/Alertness: Awake/alert Behavior During Therapy: WFL for tasks assessed/performed Overall Cognitive Status: Within Functional Limits for tasks assessed                                          Exercises    Shoulder Instructions       General Comments      Pertinent Vitals/ Pain       Pain Assessment: Faces Faces Pain Scale: Hurts little more Pain Location: feet, knees Pain Descriptors / Indicators: Aching;Sore Pain Intervention(s): Limited activity within patient's tolerance;Monitored during session;Repositioned  Home Living  Prior Functioning/Environment              Frequency  Min 2X/week        Progress Toward Goals  OT Goals(current goals can now be found in the care plan section)  Progress towards OT goals: Progressing toward goals  Acute Rehab OT Goals Patient Stated Goal: to get stronger OT Goal Formulation: With patient Time For Goal Achievement: 08/26/18 Potential to Achieve Goals: Fair  Plan Discharge plan remains appropriate    Co-evaluation    PT/OT/SLP Co-Evaluation/Treatment: Yes Reason for Co-Treatment: For patient/therapist safety;Complexity of the patient's impairments  (multi-system involvement) PT goals addressed during session: Mobility/safety with mobility OT goals addressed during session: ADL's and self-care      AM-PAC OT "6 Clicks" Daily Activity     Outcome Measure   Help from another person eating meals?: A Little Help from another person taking care of personal grooming?: A Little Help from another person toileting, which includes using toliet, bedpan, or urinal?: Total Help from another person bathing (including washing, rinsing, drying)?: Total Help from another person to put on and taking off regular upper body clothing?: A Lot Help from another person to put on and taking off regular lower body clothing?: Total 6 Click Score: 11    End of Session    OT Visit Diagnosis: Muscle weakness (generalized) (M62.81);Unsteadiness on feet (R26.81)   Activity Tolerance Patient tolerated treatment well;Patient limited by fatigue   Patient Left in chair;with call bell/phone within reach;with chair alarm set   Nurse Communication          Time: 2353-6144 OT Time Calculation (min): 27 min  Charges: OT General Charges $OT Visit: 1 Visit OT Treatments $Self Care/Home Management : 8-22 mins  Marica Otter, OTR/L Acute Rehabilitation Services [phone] [facility] pager [phone] office 08/12/2018   Keeghan Mcintire 08/12/2018, 1:26 PM

## 2018-08-12 NOTE — Progress Notes (Signed)
[facility] Progress Note  20 Days Post-Op  Subjective: CC: abdominal pain Patient having pain with dressing changes or lots of movement. Working on taking in more PO. Decreased appetite but states she can eat. Having ostomy output.   Objective: Vital signs in last 24 hours: Temp:  [98.2 F (36.8 C)-99.4 F (37.4 C)] 98.6 F (37 C) (06/30 0449) Pulse Rate:  [91-99] 97 (06/30 0449) Resp:  [15-20] 20 (06/30 0449) BP: (135-156)/(57-66) 135/66 (06/30 0449) SpO2:  [93 %-99 %] 94 % (06/30 0449) Last BM Date: 08/11/18  Intake/Output from previous day: 06/29 0701 - 06/30 0700 In: 170 [P.O.:170] Out: 1690 [Urine:1600; Drains:90] Intake/Output this shift: Total I/O In: 47 [IV Piggyback:50] Out: -   PE: Gen:  Alert, NAD, pleasant Card:  Regular rate and rhythm Pulm:  Normal effort, clear to auscultation bilaterally Abd: Soft, generalized TTP, non-distended, +BS, midline wound with some necrotic fibrous areas but also beefy granulation tissue, drains with purulent fluid, stoma pink and working Skin: warm and dry, no rashes  Psych: A&Ox3   Lab Results:  Recent Labs    08/11/18 0422 08/11/18 1802 08/12/18 0607  WBC 7.6  --  8.3  HGB 6.6* 8.2* 8.2*  HCT 22.5* 26.6* 26.4*  PLT 369  --  341   BMET Recent Labs    08/11/18 0422 08/12/18 0607  NA 142 139  K 4.5 4.1  CL 115* 111  CO2 21* 20*  GLUCOSE 110* 118*  BUN 36* 26*  CREATININE 1.11* 1.08*  CALCIUM 8.1* 8.1*   PT/INR No results for input(s): LABPROT, INR in the last 72 hours. CMP     Component Value Date/Time   NA 139 08/12/2018 0607   K 4.1 08/12/2018 0607   CL 111 08/12/2018 0607   CO2 20 (L) 08/12/2018 0607   GLUCOSE 118 (H) 08/12/2018 0607   BUN 26 (H) 08/12/2018 0607   CREATININE 1.08 (H) 08/12/2018 0607   CALCIUM 8.1 (L) 08/12/2018 0607   PROT 5.4 (L) 08/12/2018 0607   ALBUMIN 1.7 (L) 08/12/2018 0607   AST 37 08/12/2018 0607   ALT 40 08/12/2018 0607   ALKPHOS 109 08/12/2018 0607   BILITOT 0.6 08/12/2018 0607   GFRNONAA 48 (L) 08/12/2018 0607   GFRAA 56 (L) 08/12/2018 0607   Lipase     Component Value Date/Time   LIPASE 12 07/25/2018 0500       Studies/Results: No results found.  Anti-infectives: Anti-infectives (From admission, onward)   Start     Dose/Rate Route Frequency Ordered Stop   08/09/18 0900  piperacillin-tazobactam (ZOSYN) IVPB 3.375 g     3.375 g 12.5 mL/hr over 240 Minutes Intravenous Every 8 hours 08/09/18 0822     08/06/18 1400  fluconazole (DIFLUCAN) tablet 150 mg     150 mg Oral Daily 08/06/18 1205 08/08/18 0941   07/29/18 0600  meropenem (MERREM) 1 g in sodium chloride 0.9 % 100 mL IVPB  Status:  Discontinued     1 g 200 mL/hr over 30 Minutes Intravenous Every 12 hours 07/29/18 0457 08/03/18 1048   07/29/18 0500  ceFEPIme (MAXIPIME) 2 g in sodium chloride 0.9 % 100 mL IVPB  Status:  Discontinued     2 g 200 mL/hr over 30 Minutes Intravenous Every 24 hours 07/29/18 0436 07/29/18 0457   07/26/18 1400  piperacillin-tazobactam (ZOSYN) IVPB 3.375 g  Status:  Discontinued     3.375 g 12.5 mL/hr over 240 Minutes Intravenous Every 6 hours 07/26/18 0906 07/26/18 1339  07/26/18 1400  cefTRIAXone (ROCEPHIN) 1 g in sodium chloride 0.9 % 100 mL IVPB  Status:  Discontinued     1 g 200 mL/hr over 30 Minutes Intravenous Every 24 hours 07/26/18 1339 07/29/18 0436   07/23/18 0200  piperacillin-tazobactam (ZOSYN) IVPB 2.25 g  Status:  Discontinued     2.25 g 100 mL/hr over 30 Minutes Intravenous Every 6 hours 07/22/18 2248 07/26/18 0906   07/22/18 1800  piperacillin-tazobactam (ZOSYN) IVPB 3.375 g  Status:  Discontinued     3.375 g 12.5 mL/hr over 240 Minutes Intravenous Every 8 hours 07/22/18 0953 07/22/18 2248   07/22/18 1000  piperacillin-tazobactam (ZOSYN) IVPB 3.375 g     3.375 g 100 mL/hr over 30 Minutes Intravenous  Once 07/22/18 0953 07/22/18 1239   07/22/18 0745  ciprofloxacin (CIPRO) IVPB 400 mg     400 mg 200 mL/hr over 60 Minutes  Intravenous  Once 07/22/18 0739 07/22/18 0921   07/22/18 0745  metroNIDAZOLE (FLAGYL) IVPB 500 mg     500 mg 100 mL/hr over 60 Minutes Intravenous  Once 07/22/18 0739 07/22/18 1024       Assessment/Plan RLL pneumonia - Rx with Merrem  Completed VDRF,resolved Type 2 diabetes Hypothyroid - on Synthroid Rheumatoid arthritis OSA Morbid obesity BMI 44 AKI - Cr trending down Malnutrition - prealbumin10.7 today -nutrition consult/calorie count Klebsiella UTI, resolved Anemia - being transfused 6/29 Thrombocytopenia -resolved  Septic shock Ischemic colitis, acute cholecystitis S/pExploratory laparotomy, total abdominal colectomy, [name], cholecystectomy-6/10-Dr. Derrell Lolling - POD#20 - CT scan 6/26: Loculated air-fluid collection anterior left hemidiaphragm adjacent small bowel measuring 15.7 x 7.7 x 4.8 cm - S/p IR drain 08/09/18 - Midline wound VAC discontinued 08/11/2018 -wet-to-dry TID - Having decent bowel function currently. -Onsoft diet, TNA discontinued, poor oral intake on calorie count - nutrition consult - work on mobilizing more - concern for rectal stump leak   ID -Meropenem completed, Diflucan 6/24-6/26; PO Mycostatin 6/23>>, zosyn 6/26 >> FEN -IVF,soft diet  VTE -SCDs, no chemical prophylaxis in setting of anemia Follow up -Dr. Marnette Burgess - son Onalee Hua [phone], husband Henreitta Cea [phone]  LOS: 21 days    Wells Guiles , [facility] Surgery 08/12/2018, 10:34 AM Pager: [phone] Consults: [phone]

## 2018-08-12 NOTE — Progress Notes (Signed)
Physical Therapy Treatment Patient Details Name: Hailey Hughes MRN: [id number] DOB: 03-07-1940 Today's Date: 08/12/2018    History of Present Illness 78 year old white female admitted the hospital 6/9 with CT scan evidence of colitis transferred to the ICU  for septic shock, s/p colectomy and cholecystectomy 07/23/18    PT Comments    Pt motivated to participate in PT, but hesitant to progress to OOB activity. Pt required mod-max assist +2 for mobility today, able to transfer with scoot pivot from bed to drop arm recliner. Pt continuing to present with profound weakness in LEs, as well as LE and abdominal pain. PT to progress mobility as tolerated, recommending SNF placement at this time.    Follow Up Recommendations  SNF     Equipment Recommendations  Other (comment)(TBD)    Recommendations for Other Services       Precautions / Restrictions Precautions Precautions: Fall Precaution Comments: multiple lines, JP drain on L, A Line and PICC RUE, abdominal wound VAC, flexiseal Restrictions Weight Bearing Restrictions: No    Mobility  Bed Mobility Overal bed mobility: Needs Assistance Bed Mobility: Rolling;Sidelying to Sit Rolling: +2 for physical assistance;Mod assist Sidelying to sit: Max assist;+2 for physical assistance       General bed mobility comments: Mod assist +2 for rolling to side with use of bed pads, LE management, pt with use of bed rails to complete roll. Max assist +2 for sidelying to sit for trunk elevation, LE management, and scooting to EOB with use of bed pad.  Transfers Overall transfer level: Needs assistance Equipment used: Rolling walker (2 wheeled) Transfers: Sit to/from Stand;Lateral/Scoot Transfers Sit to Stand: Total assist;+2 physical assistance;+2 safety/equipment;From elevated surface        Lateral/Scoot Transfers: +2 safety/equipment;+2 physical assistance;Mod assist General transfer comment: Attempted sit to stand, required total  assist +2 and unable to extend knees/hips to come to standing. PT and OT decided on scoot pivot to drop arm recliner, requiring max assist +2 for translation of hips with bed pads and assist in positioning in recliner.  Ambulation/Gait Ambulation/Gait assistance: (NT)               Stairs             Wheelchair Mobility    Modified Rankin (Stroke Patients Only)       Balance Overall balance assessment: Needs assistance Sitting-balance support: Feet supported;Bilateral upper extremity supported Sitting balance-Leahy Scale: Poor Sitting balance - Comments: able to sit independently for 30 seconds with UE support, pt with posterior leaning with fatigue requiring PT/OT assist to correct Postural control: Posterior lean                                  Cognition Arousal/Alertness: Awake/alert Behavior During Therapy: WFL for tasks assessed/performed Overall Cognitive Status: Within Functional Limits for tasks assessed                                        Exercises General Exercises - Lower Extremity Ankle Circles/Pumps: 10 reps;Both;Seated;AROM Quad Sets: AROM;Both;10 reps;Seated Long Arc Quad: AROM;Both;10 reps;Seated    General Comments        Pertinent Vitals/Pain Pain Assessment: Faces Faces Pain Scale: Hurts little more Pain Location: feet, knees Pain Descriptors / Indicators: Aching;Sore Pain Intervention(s): Limited activity within patient's tolerance;Monitored during session;Repositioned  Home Living                      Prior Function            PT Goals (current goals can now be found in the care plan section) Acute Rehab PT Goals Patient Stated Goal: to get stronger PT Goal Formulation: With patient Time For Goal Achievement: 08/15/18 Potential to Achieve Goals: Fair Progress towards PT goals: Progressing toward goals    Frequency    Min 2X/week      PT Plan Current plan remains  appropriate    Co-evaluation PT/OT/SLP Co-Evaluation/Treatment: Yes Reason for Co-Treatment: For patient/therapist safety;To address functional/ADL transfers PT goals addressed during session: Mobility/safety with mobility OT goals addressed during session: ADL's and self-care      AM-PAC PT "6 Clicks" Mobility   Outcome Measure    Help needed moving from lying on your back to sitting on the side of a flat bed without using bedrails?: A Lot Help needed moving to and from a bed to a chair (including a wheelchair)?: Total Help needed standing up from a chair using your arms (e.g., wheelchair or bedside chair)?: Total Help needed to walk in hospital room?: Total Help needed climbing 3-5 steps with a railing? : Total 6 Click Score: 6    End of Session   Activity Tolerance: Patient limited by fatigue;Patient limited by pain Patient left: with call bell/phone within reach;in chair Nurse Communication: Mobility status;Need for lift equipment(lift pad placed under pt in chair) PT Visit Diagnosis: Unsteadiness on feet (R26.81);Difficulty in walking, not elsewhere classified (R26.2);Muscle weakness (generalized) (M62.81);Pain     Time: 8250-5397 PT Time Calculation (min) (ACUTE ONLY): 26 min  Charges:  $Therapeutic Activity: 8-22 mins                     Hailey Hughes, PT Acute Rehabilitation Services Pager [phone]  Office [phone]   Hailey Hughes 08/12/2018, 12:36 PM

## 2018-08-12 NOTE — Progress Notes (Signed)
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 Her condition rapidly deteriorated needing transfer to ICU on day of admission for persistent hypotension and lactic acidosis with no response with aggressive IV fluid resuscitation and consistent with septic shock as she ultimately required pressor support, intubation for acute proximal respiratory failure, acute renal failure.Patient underwent exploratory laparotomy on 6/10 to progressively worsening abdominal exam concerning for peritonitis and was found to have ischemic colitis requiring abdominal colectomy, ileostomy placement and cholecystectomy.   Hospital course complicated by prolonged weaning of pressor support, concern for aspiration given hypoxia and elevated requirement (6/14), and change in mentation though CT head at that time remained negative attributed to Dilaudid.  Most recently found to have fluid collection in her abdomen. Underwent aspiration and drain placement by interventional radiology on 6/27.  6/29: No acute events overnight, tolerating soft diet, wound VAC out. Hb 6.9- got 1 unit PRBC  6/30-hb improved appropriately, tolerating diet and oral intake increasing. Afebrile, wbc stable.  Subjective: Doing well.  Reports he ate 50% of her meal this morning.  Denies nausea vomiting chest pain abdominal pain. No Events overnight.  Assessment & Plan:  Ischemic colitis/acute cholecystitis-s/p total abdominal colectomy, [name], cholecystectomy 6/10: Surgical site is clean and  healing well.  Wound VAC has been removed 6/29. Being followed by general surgery and wound.JP drainx2 place with pus drainage once output slows down surgery is planning to repeat CT scan.  Now on oral diet and tolerating well. cont supplement. TPN weaned off.  Monitor pain control-remains on his scheduled Tylenol, PRN IV Dilaudid and oxycodone.  Left abdominal abscess:  foul smelling Vac output noted so CT abd was done- showed loculated fluid collection concerning for abscess s/p aspiration and drain placement by IR on 6/27 AND Zosyn started for 5 days on 6/27.Wound culture growing Enterococcus faecalis-sensitive to Unasyn/vancomycin,-holding for possible anaerobes.  Follow-up on culture.WBC is stable afebrile and Clinically improving.  Continue IV antibiotics, may need to extend the antibiotics based upon drainage.   Anemia of chronic disease hemoglobin down to 8 to 6.6 g on 7.2 g, s/p 1 unit PRBC 6/29 and improved. No acute blood loss noted.Iron panel consistent with anemia of chronic disease. Staying between 7 and 8, most part Recent Labs  Lab 08/09/18 0455 08/10/18 0338 08/11/18 0422 08/11/18 1802 08/12/18 0607  HGB 7.7* 7.0* 6.6* 8.2* 8.2*  HCT 25.4* 23.8* 22.5* 26.6* 26.4*   Moderate protein calorie malnutrition in patient with morbid obesity.  Secondary to patient's abdominal surgery/poor absorption.Prealbumin 11.5. Off TPN and tolerating diet, on calorie count appreciate dietitian input.  Continue supplement and augment nutrition.Meets criteria for morbid obesity with BMI greater than 40.    Septic shock, resolved. Likely bacterial translocation from ischemic colitis, no longer requiring pressor support, completed Zosyn (6/9-6/13), Rocephin 6/13-6/16), meropenem completed.  Currently on Zosyn for left abdominal abscess.  Acute hypoxic respiratory failure, resolved.on RA. she was Intubated on 6/10, extubated on 6/15. Suspected aspiration pneumonia (right lower lobe consolidation on  imaging) as well as severe sepsis from colitis.  Encourage ambulation//IS  Hyperkalemia: resolved.  AKI on CKD, Stage 3  resolved.Peak creatinine of 3. AKI from ATN. baseline 1.4-1.6(care everywhere) she is no stable, creat at 1.1.Avoiding nephrotoxins  Recent Labs  Lab 08/08/18 0449 08/09/18 0455 08/10/18 0338 08/11/18 0422 08/12/18 0607  CREATININE 0.99 1.00 1.10* 1.11* 1.08*    Hyperphosphatemia.Unclear etiology as kidney function seems stable, repeat lab shows resolution  Type 2 diabetes. Stable.continue sliding scale.  Hypothyroidism continue Synthroid   Deconditioning, continue to work with PT/OT recs SNF once medically stable.  Pressure injury of sacrum, not present on admission.Skin intact, continue local care, continue offloading.  Bilateral ankle pain:Uric acid level normal.  Continue pain control.  DVT prophylaxis: SCD Code Status: FULL Family Communication: called son's no no answer x2-no option to leave msg 6/29. I called again both his work no and cell no- no answer. Disposition Plan: remains inpatient pending clinical improvement, pending completion of IV antibiotics and once oral intake is adequate with calorie count. Will need SNF.  Consultants:  Critical care  General surgery  Interventional radiology  Procedures:  Exploratory laparotomy  Aspiration of residual fluid plus drain placement 6/27  Antimicrobials:  IV Zosyn 6/9-6/13, restarted 6/27-7/1  IV Rocephin 6/13-6/16  IV meropenem completed Anti-infectives (From admission, onward)   Start     Dose/Rate Route Frequency Ordered Stop   08/09/18 0900  piperacillin-tazobactam (ZOSYN) IVPB 3.375 g     3.375 g 12.5 mL/hr over 240 Minutes Intravenous Every 8 hours 08/09/18 0822     08/06/18 1400  fluconazole (DIFLUCAN) tablet 150 mg     150 mg Oral Daily 08/06/18 1205 08/08/18 0941   07/29/18 0600  meropenem (MERREM) 1 g in sodium chloride 0.9 % 100 mL IVPB  Status:  Discontinued     1  g 200 mL/hr over 30 Minutes Intravenous Every 12 hours 07/29/18 0457 08/03/18 1048   07/29/18 0500  ceFEPIme (MAXIPIME) 2 g in sodium chloride 0.9 % 100 mL IVPB  Status:  Discontinued     2 g 200 mL/hr over 30 Minutes Intravenous Every 24 hours 07/29/18 0436 07/29/18 0457   07/26/18 1400  piperacillin-tazobactam (ZOSYN) IVPB 3.375 g  Status:  Discontinued     3.375 g 12.5 mL/hr over 240 Minutes Intravenous Every 6 hours 07/26/18 0906 07/26/18 1339   07/26/18 1400  cefTRIAXone (ROCEPHIN) 1 g in sodium chloride 0.9 % 100 mL IVPB  Status:  Discontinued     1 g 200 mL/hr over 30 Minutes Intravenous Every 24 hours 07/26/18 1339 07/29/18 0436   07/23/18 0200  piperacillin-tazobactam (ZOSYN) IVPB 2.25 g  Status:  Discontinued     2.25 g 100 mL/hr over 30 Minutes Intravenous Every 6 hours 07/22/18 2248 07/26/18 0906   07/22/18 1800  piperacillin-tazobactam (ZOSYN) IVPB 3.375 g  Status:  Discontinued     3.375 g 12.5 mL/hr over 240 Minutes Intravenous Every 8 hours 07/22/18 0953 07/22/18 2248   07/22/18 1000  piperacillin-tazobactam (ZOSYN) IVPB 3.375 g     3.375 g 100 mL/hr over 30 Minutes Intravenous  Once 07/22/18 0953 07/22/18 1239   07/22/18 0745  ciprofloxacin (CIPRO) IVPB 400 mg     400 mg 200 mL/hr over 60 Minutes Intravenous  Once 07/22/18 0739 07/22/18 0921   07/22/18 0745  metroNIDAZOLE (FLAGYL) IVPB 500 mg     500 mg 100 mL/hr over 60 Minutes Intravenous  Once 07/22/18 0739 07/22/18 1024       Objective: Vitals:   08/11/18 1305 08/11/18 1529 08/11/18 2117 08/12/18 0449  BP: (!) 138/57 (!) 152/66 (!) 154/65 135/66  Pulse: 99 93 95 97  Resp: 16 16 15 20   Temp: 98.2 F (36.8 C) 98.9 F (37.2 C) 99.4 F (37.4 C) 98.6 F (37 C)  TempSrc: Oral Oral Oral Oral  SpO2: 96% 93% 96% 94%  Weight:      Height:        Intake/Output Summary (Last 24 hours) at 08/12/2018 1031 Last data filed at 08/12/2018 0806 Gross per 24 hour  Intake 170 ml  Output 1690 ml  Net -1520 ml    Filed Weights   07/30/18 0910 07/31/18 1016 08/07/18 0551  Weight: 112.3 kg 107.2 kg 110.1 kg   Weight change:   Body mass index is 44.4 kg/m.  Intake/Output from previous day: 06/29 0701 - 06/30 0700 In: 170 [P.O.:170] Out: 1690 [Urine:1600; Drains:90] Intake/Output this shift: Total I/O In: 50 [IV Piggyback:50] Out: -   Examination:  General exam: Alert awake oriented, chronically sick looking but appears to be in good spirit. HEENT:PERRL,Oral mucosa moist, Ear/Nose normal on gross exam.  Respiratory system: B/L clear, diminished at base  Cardiovascular system: S1 & S2 heard,No JVD, murmurs. Gastrointestinal system: Abdomen is  soft, ileostomy + on right abdomen, 2 drains present, wound site dressed clean, I did not remove the wound dressing. Nervous System:Alert and oriented. No focal neurological deficits/moving extremities Extremities: No edema, no clubbing, distal peripheral pulses palpable. Skin: No rashes, lesions, no icterus MSK: Normal muscle bulk,tone ,power  Medications:  Scheduled Meds: . sodium chloride   Intravenous Once  . acetaminophen  1,000 mg Oral Q6H  . Chlorhexidine Gluconate Cloth  6 each Topical Daily  . feeding supplement (ENSURE ENLIVE)  237 mL Oral TID BM  . feeding supplement (PRO-STAT SUGAR FREE 64)  30 mL Oral TID  . insulin aspart  0-15 Units Subcutaneous TID AC & HS  . levothyroxine  112 mcg Oral Q0600  . mouth rinse  15 mL Mouth Rinse BID  . nystatin  5 mL Oral QID  . nystatin   Topical QODAY  . olopatadine  1 drop Both Eyes BID  . pantoprazole  40 mg Oral QHS  . sodium chloride flush  10-40 mL Intracatheter Q12H  . sodium chloride flush  5 mL Intracatheter Q8H  . tobramycin-dexamethasone  1 application Both Eyes BID   Continuous Infusions: . sodium chloride 250 mL (08/11/18 0037)  . piperacillin-tazobactam (ZOSYN)  IV 3.375 g (08/12/18 0806)    Data Reviewed: I have personally reviewed following labs and imaging studies  CBC:  Recent Labs  Lab 08/08/18 1544 08/09/18 0455 08/10/18 0338 08/11/18 0422 08/11/18 1802 08/12/18 0607  WBC 10.0 11.4* 8.7 7.6  --  8.3  NEUTROABS  --  8.6* 6.4 4.8  --   --   HGB 7.7* 7.7* 7.0* 6.6* 8.2* 8.2*  HCT 25.8* 25.4* 23.8* 22.5* 26.6* 26.4*  MCV 95.9 93.7 93.0 93.4  --  91.3  PLT 392 404* 418* 369  --  341   Basic Metabolic Panel: Recent Labs  Lab 08/06/18 0253 08/07/18 0408 08/08/18 0449 08/09/18 0455 08/10/18 0338 08/11/18 0422 08/12/18 0607  NA 142 141 143 142 139 142 139  K 5.2* 5.1 4.9 5.0 4.4 4.5 4.1  CL 115* 115* 115* 107 112* 115* 111  CO2 22 22 22  19* 19* 21* 20*  GLUCOSE 182* 154* 111* 91 124* 110* 118*  BUN 66* 65* 55* 47* 40* 36* 26*  CREATININE 1.15* 1.28* 0.99 1.00 1.10* 1.11* 1.08*  CALCIUM 8.1* 8.2* 8.3* 8.8* 8.2* 8.1* 8.1*  MG 2.1 2.1  --   --   --   --   --   PHOS 3.8 5.0* 3.8  --   --   --   --    GFR: Estimated Creatinine Clearance: 48.6 mL/min (A) (by C-G formula based on SCr of 1.08 mg/dL (H)). Liver Function Tests: Recent Labs  Lab 08/07/18 0408 08/12/18 0607  AST 37 37  ALT 33 40  ALKPHOS 111 109  BILITOT 0.2* 0.6  PROT 5.2* 5.4*  ALBUMIN 1.6* 1.7*   No results for input(s): LIPASE, AMYLASE in the last 168 hours. No results for input(s): AMMONIA in the last 168 hours. Coagulation Profile: No results for input(s): INR, PROTIME in the last 168 hours. Cardiac Enzymes: No results for input(s): CKTOTAL, CKMB, CKMBINDEX, TROPONINI in the last 168 hours. BNP (last 3 results) No results for input(s): PROBNP in the last 8760 hours. HbA1C: No results for input(s): HGBA1C in the last 72 hours. CBG: Recent Labs  Lab 08/11/18 0750 08/11/18 1145 08/11/18 1620 08/11/18 2113 08/12/18 0753  GLUCAP 108* 172* 154* 109* 105*   Lipid Profile: No results for input(s): CHOL, HDL, LDLCALC, TRIG, CHOLHDL, LDLDIRECT in the last 72 hours. Thyroid Function Tests: No results for input(s): TSH, T4TOTAL, FREET4, T3FREE, THYROIDAB in the last  72 hours. Anemia Panel: No results for input(s): VITAMINB12, FOLATE, FERRITIN, TIBC, IRON, RETICCTPCT in the last 72 hours. Sepsis Labs: No results for input(s): PROCALCITON, LATICACIDVEN in the last 168 hours.  Recent Results (from the past 240 hour(s))  Aerobic/Anaerobic Culture (surgical/deep wound)     Status: None (Preliminary result)   Collection Time: 08/09/18  2:52 PM   Specimen: Abscess  Result Value Ref Range Status   Specimen Description   Final    ABSCESS Performed at [facility], [address] [place], [place] [address]    Special Requests   Final    NONE Performed at [facility], [address] [place], [place] [address]    Gram Stain   Final    ABUNDANT WBC PRESENT, PREDOMINANTLY PMN ABUNDANT GRAM POSITIVE COCCI IN PAIRS ABUNDANT GRAM NEGATIVE RODS    Culture   Final    ABUNDANT ENTEROCOCCUS FAECALIS HOLDING FOR POSSIBLE ANAEROBE Performed at [facility], [address] [place], [place] [address]    Report Status PENDING  Incomplete   Organism ID, Bacteria ENTEROCOCCUS FAECALIS  Final      Susceptibility   Enterococcus faecalis - MIC*    AMPICILLIN <=2 SENSITIVE Sensitive     VANCOMYCIN 2 SENSITIVE Sensitive     GENTAMICIN SYNERGY SENSITIVE Sensitive     * ABUNDANT ENTEROCOCCUS FAECALIS      Radiology Studies: No results found.    LOS: 21 days   Time spent: More than 50% of that time was spent in counseling and/or coordination of care.  Lanae Boastamesh Larin Depaoli, MD [facility]  08/12/2018, 10:31 AM

## 2018-08-12 NOTE — Progress Notes (Signed)
Referring Physician(s): Connor,C  Supervising Physician: Simonne Come  Patient Status:  [facility] - In-pt  Chief Complaint:  Abdominal pain/abscess  Subjective: Pt resting quietly in bed; has been up to chair today; no acute changes; some occ nausea, abd soreness   Allergies: Bee venom and Epinephrine  Medications: Prior to Admission medications   Medication Sig Start Date End Date Taking? Authorizing Provider  amLODipine-benazepril (LOTREL) 5-20 MG per capsule Take 1 capsule by mouth every evening.    Yes [provider]  aspirin 81 MG tablet Take 81 mg by mouth every evening.    Yes [provider]  atorvastatin (LIPITOR) 20 MG tablet Take 20 mg by mouth every evening.    Yes [provider]  bumetanide (BUMEX) 2 MG tablet Take 2 mg by mouth every evening.    Yes [provider]  celecoxib (CELEBREX) 200 MG capsule Take 200 mg by mouth every evening.    Yes [provider]  febuxostat (ULORIC) 40 MG tablet Take 40 mg by mouth every evening. 12/26/17  Yes [provider]  fexofenadine (ALLEGRA) 180 MG tablet Take 180 mg by mouth daily as needed for allergies.    Yes [provider]  fluticasone (FLONASE) 50 MCG/ACT nasal spray Place 2 sprays into both nostrils daily as needed for allergies.  12/30/17  Yes [provider]  HYDROcodone-acetaminophen (NORCO) 7.5-325 MG tablet Take 1 tablet by mouth 3 (three) times daily as needed. 02/18/18  Yes [provider]  levothyroxine (SYNTHROID, LEVOTHROID) 112 MCG tablet Take 112 mcg by mouth daily.   Yes [provider]  metFORMIN (GLUCOPHAGE-XR) 500 MG 24 hr tablet Take 1,000 mg by mouth every evening.  06/26/17  Yes [provider]  metoprolol succinate (TOPROL-XL) 50 MG 24 hr tablet Take 50 mg by mouth daily. 12/30/17  Yes [provider]  montelukast (SINGULAIR) 10 MG tablet Take 10 mg by mouth daily as needed (allergies).    Yes  [provider]  olopatadine (PATANOL) 0.1 % ophthalmic solution Place 1 drop into both eyes daily as needed for allergies.  12/30/17  Yes [provider]  pantoprazole (PROTONIX) 40 MG tablet Take 1 tablet (40 mg total) by mouth daily. 02/09/14  Yes Hart Carwin, MD  senna (SENOKOT) 8.6 MG tablet Take 1 tablet by mouth daily as needed for constipation.   Yes [provider]  tiZANidine (ZANAFLEX) 4 MG capsule Take 4 mg by mouth 2 (two) times daily as needed for muscle spasms.    Yes [provider]  tobramycin-dexamethasone (TOBRADEX) ophthalmic ointment Place 1 application into both eyes 2 (two) times daily.   Yes [provider]  triamcinolone cream (KENALOG) 0.1 % Apply 1 application topically daily as needed (ezcema).  05/29/17  Yes [provider]     Vital Signs: BP (!) 141/63 (BP Location: Left Arm)    Pulse 91    Temp 98.3 F (36.8 C) (Oral)    Resp (!) 24    Ht 5\' 2"  (1.575 m)    Wt 242 lb 11.6 oz (110.1 kg)    LMP  (LMP Unknown)    SpO2 93%    BMI 44.40 kg/m   Physical Exam; left abd drain intact, insertion site ok, output 55 cc beige colored fluid, site mildly tender  Imaging: Ct Abdomen Pelvis W Contrast  Result Date: 08/08/2018 CLINICAL DATA:  Postoperative abdominal pain, foul-smelling discharge, JP drain, wound VAC EXAM: CT ABDOMEN AND PELVIS WITH CONTRAST  TECHNIQUE: Multidetector CT imaging of the abdomen and pelvis was performed using the standard protocol following bolus administration of intravenous contrast. CONTRAST:  OMNIPAQUE IOHEXOL 300 MG/ML SOLN, 27mL OMNIPAQUE IOHEXOL 300 MG/ML SOLN, additional oral enteric contrast COMPARISON:  07/29/2018 FINDINGS: Lower chest: Moderate left, small right pleural effusions and associated atelectasis or consolidation. Hepatobiliary: No focal liver abnormality is seen. Status post cholecystectomy. No biliary dilatation. Pancreas: Unremarkable. No pancreatic ductal dilatation or  surrounding inflammatory changes. Spleen: Normal in size without significant abnormality. Adrenals/Urinary Tract: Adrenal glands are unremarkable. Kidneys are normal, without renal calculi, solid lesion, or hydronephrosis. Bladder is unremarkable. Stomach/Bowel: Stomach is within normal limits. Status post colectomy with right lower quadrant end ostomy. Vascular/Lymphatic: Aortic atherosclerosis. No enlarged abdominal or pelvic lymph nodes. Reproductive: No mass or other significant abnormality. Other: Surgical drain is positioned about the low abdomen, tip in the right lower quadrant. Small volume loculated appearing perisplenic ascites similar to prior (series 2, image 23). Loculated appearing air and fluid collection about the anterior left hemiabdomen adjacent to loops of small bowel measuring approximately 15.7 x 7.7 x 4.8 cm (series 2, image 50, series 6, image 58). Musculoskeletal: No acute or significant osseous findings. IMPRESSION: 1. Loculated appearing air and fluid collection about the anterior left hemiabdomen adjacent to loops of small bowel measuring approximately 15.7 x 7.7 x 4.8 cm (series 2, image 50, series 6, image 58), highly suspicious for abscess. 2. Small volume loculated appearing ascites. 3. Surgical drain in the low abdomen, status post colectomy with right lower quadrant end ostomy. 4.  Pleural effusions. Electronically Signed   By: Lauralyn Primes [name]   On: 08/08/2018 20:19   Ct Image Guided Drainage By Percutaneous Catheter  Result Date: 08/10/2018 INDICATION: 78 year old female with a history abdominal abscess EXAM: CT GUIDED DRAINAGE OF ABSCESS MEDICATIONS: The patient is currently admitted to the hospital and receiving intravenous antibiotics. The antibiotics were administered within an appropriate time frame prior to the initiation of the procedure. ANESTHESIA/SEDATION: 1.0 mg IV Versed 50 mcg IV Fentanyl Moderate Sedation Time:  24 The patient was continuously monitored during  the procedure by the interventional radiology nurse under my direct supervision. COMPLICATIONS: None TECHNIQUE: Informed written consent was obtained from the patient after a thorough discussion of the procedural risks, benefits and alternatives. All questions were addressed. Maximal Sterile Barrier Technique was utilized including caps, mask, sterile gowns, sterile gloves, sterile drape, hand hygiene and skin antiseptic. A timeout was performed prior to the initiation of the procedure. PROCEDURE: The operative field was prepped with chlorhexidine in a sterile fashion, and a sterile drape was applied covering the operative field. A sterile gown and sterile gloves were used for the procedure. Local anesthesia was provided with 1% Lidocaine. Once a scout CT was acquired of the abdomen, we targeted the air in fluid collection in the left abdomen. The patient was prepped and draped in the usual sterile fashion. 1% lidocaine was used for local anesthesia. Using CT guidance, modified Seldinger technique was used to place a 10 [place] drain into the fluid and gas collection within the left abdomen. Frankly purulent material was aspirated. Catheter was sutured in position and attached to bulb suction. Patient tolerated the procedure well and remained hemodynamically stable throughout. No complications were encountered and no significant blood loss. FINDINGS: Scout CT demonstrates surgical changes with persisting fluid and gas collection in the left abdomen. This was the collection targeted. Images demonstrate safe placement of 10 French drain into the fluid collection from  left anterior approach. IMPRESSION: Status post CT-guided drainage of left abdominal abscess. Signed, Dulcy Fanny. Dellia Nims, RPVI [facility] Radiology Electronically Signed   By: Corrie Mckusick D.O.   On: 08/10/2018 08:25    Labs:  CBC: Recent Labs    08/09/18 0455 08/10/18 0338 08/11/18 0422  08/11/18 1802 08/12/18 0607  WBC 11.4* 8.7 7.6  --  8.3  HGB 7.7* 7.0* 6.6* 8.2* 8.2*  HCT 25.4* 23.8* 22.5* 26.6* 26.4*  PLT 404* 418* 369  --  341    COAGS: No results for input(s): INR, APTT in the last 8760 hours.  BMP: Recent Labs    08/09/18 0455 08/10/18 0338 08/11/18 0422 08/12/18 0607  NA 142 139 142 139  K 5.0 4.4 4.5 4.1  CL 107 112* 115* 111  CO2 19* 19* 21* 20*  GLUCOSE 91 124* 110* 118*  BUN 47* 40* 36* 26*  CALCIUM 8.8* 8.2* 8.1* 8.1*  CREATININE 1.00 1.10* 1.11* 1.08*  GFRNONAA 53* 47* 47* 48*  GFRAA >60 55* 54* 56*    LIVER FUNCTION TESTS: Recent Labs    07/31/18 0500 08/04/18 0430 08/07/18 0408 08/12/18 0607  BILITOT 0.2* 0.5 0.2* 0.6  AST 23 35 37 37  ALT 13 33 33 40  ALKPHOS 51 99 111 109  PROT 4.1* 5.4* 5.2* 5.4*  ALBUMIN 1.3* 1.7* 1.6* 1.7*    Assessment and Plan: Pt with hx septic shock, ischemic colitis, acute cholecystitis S/pExploratory laparotomy, total abdominal colectomy, [name], cholecystectomy-6/10-Dr. Dalbert Batman; s/p left abd abscess drain placement 6/27; afebrile; WBC nl; hgb 8.2 (6.6), creat 1.08; cont current tx/drain irrigation;  check f/u CT once drain output declines; will likely also need drain injection before removal; other plans per [facility]   Electronically Signed: D. Rowe Robert, PA-C 08/12/2018, 1:36 PM   I spent a total of 15 minutes at the the patient's bedside AND on the patient's hospital floor or unit, greater than 50% of which was counseling/coordinating care for left abdominal abscess drain    Patient ID: Hailey Hughes, female   DOB: 1940/08/31, 78 y.o.   MRN: [id number]

## 2018-08-13 LAB — GLUCOSE, CAPILLARY
Glucose-Capillary: 122 mg/dL — ABNORMAL HIGH (ref 70–99)
Glucose-Capillary: 112 mg/dL — ABNORMAL HIGH (ref 70–99)
Glucose-Capillary: 139 mg/dL — ABNORMAL HIGH (ref 70–99)
Glucose-Capillary: 115 mg/dL — ABNORMAL HIGH (ref 70–99)

## 2018-08-13 NOTE — Progress Notes (Signed)
Nutrition Follow-up  DOCUMENTATION CODES:   Obesity unspecified  INTERVENTION:   -D/c Ensure per patient request -Continue Prostat liquid protein PO 30 ml TID with meals, each supplement provides 100 kcal, 15 grams protein.  NUTRITION DIAGNOSIS:   Increased nutrient needs related to acute illness as evidenced by estimated needs.  Ongoing.  GOAL:   Patient will meet greater than or equal to 90% of their needs  Progressing.  MONITOR:   PO intake, Supplement acceptance, Labs, Weight trends, Skin  REASON FOR ASSESSMENT:   Consult Assessment of nutrition requirement/status, Poor PO  ASSESSMENT:   78 y.o. female with medical history significant for HTN, hyperlipidemia, prior abdominal surgery d/t megacolon when she was 78 years old, hypothyroidism, type 2 DM, and rheumatoid arthritis. She presented to [facility] due to abdominal pain, N/V x1 day. Stated she has been experiencing generalized weakness and thought it was due to constipation which she was experiencing for the 3 days PTA. Had an enema with minimal hard stool output 6/8. Since then has been experiencing worsening abdominal cramping with nausea and vomiting x2.  **RD working remotely**  Patient currently consuming 50% of meals at this time. Pt consumed ~375 kcal and 17g protein with dinner last night and ~350 kcal and 11g protein for breakfast this morning. Pt does not like Ensure supplements, will d/c. Pt is accepting Prostat so will continue this order for additional protein.   Per weight records, weights are staying between 239-247 lbs.   Medications reviewed. Labs reviewed: CBGs: 112-122 GFR: 48  Diet Order:   Diet Order            DIET SOFT Room service appropriate? Yes; Fluid consistency: Thin  Diet effective now              EDUCATION NEEDS:   Not appropriate for education at this time  Skin:  Skin Assessment: Skin Integrity Issues: Skin Integrity Issues:: DTI DTI: sacrum (new 6/19) Incisions:  abdominal (6/10)  Last BM:  7/1  Height:   Ht Readings from Last 1 Encounters:  07/23/18 5\' 2"  (1.575 m)    Weight:   Wt Readings from Last 1 Encounters:  08/07/18 110.1 kg    Ideal Body Weight:  50 kg  BMI:  Body mass index is 44.4 kg/m.  Estimated Nutritional Needs:   Kcal:  2100-2300 kcal  Protein:  100-115 grams  Fluid:  >/= 1.8 L/day  Clayton Bibles, MS, RD, LDN [place] Dietitian Pager: [phone] After Hours Pager: [phone]

## 2018-08-13 NOTE — Progress Notes (Signed)
[facility] [place] [facility] Progress Note  21 Days Post-Op  Subjective: CC: no new complaints Patient reports mild abdominal pain which is worse with dressing changes. Was able to slide to the chair with PT yesterday. Willing to consider SNF upon discharge as she lives at home with her husband who is 41. Working on eating, reports she is eating about 50% of meals. Does not like the ensure, it makes her feel sick.  Objective: Vital signs in last 24 hours: Temp:  [98 F (36.7 C)-98.9 F (37.2 C)] 98 F (36.7 C) (07/01 0545) Pulse Rate:  [91-97] 97 (07/01 0545) Resp:  [14-24] 14 (07/01 0545) BP: (125-141)/(56-63) 125/56 (07/01 0545) SpO2:  [93 %-96 %] 96 % (07/01 0545) Last BM Date: 08/11/18  Intake/Output from previous day: 06/30 0701 - 07/01 0700 In: 545 [P.O.:240; I.V.:5; IV Piggyback:300] Out: 165 [Drains:165] Intake/Output this shift: No intake/output data recorded.  PE: Gen:  Alert, NAD, pleasant Card:  Regular rate and rhythm Pulm:  Normal effort, clear to auscultation bilaterally Abd: Soft, generalized TTP, non-distended, +BS, midline wound with some necrotic fibrous areas but also beefy granulation tissue, drains with purulent fluid, stoma pink and working Skin: warm and dry, no rashes  Psych: A&Ox3   Lab Results:  Recent Labs    08/11/18 0422 08/11/18 1802 08/12/18 0607  WBC 7.6  --  8.3  HGB 6.6* 8.2* 8.2*  HCT 22.5* 26.6* 26.4*  PLT 369  --  341   BMET Recent Labs    08/11/18 0422 08/12/18 0607  NA 142 139  K 4.5 4.1  CL 115* 111  CO2 21* 20*  GLUCOSE 110* 118*  BUN 36* 26*  CREATININE 1.11* 1.08*  CALCIUM 8.1* 8.1*   PT/INR No results for input(s): LABPROT, INR in the last 72 hours. CMP     Component Value Date/Time   NA 139 08/12/2018 0607   K 4.1 08/12/2018 0607   CL 111 08/12/2018 0607   CO2 20 (L) 08/12/2018 0607   GLUCOSE 118 (H) 08/12/2018 0607   BUN 26 (H) 08/12/2018 0607   CREATININE 1.08 (H) 08/12/2018 0607   CALCIUM 8.1 (L)  08/12/2018 0607   PROT 5.4 (L) 08/12/2018 0607   ALBUMIN 1.7 (L) 08/12/2018 0607   AST 37 08/12/2018 0607   ALT 40 08/12/2018 0607   ALKPHOS 109 08/12/2018 0607   BILITOT 0.6 08/12/2018 0607   GFRNONAA 48 (L) 08/12/2018 0607   GFRAA 56 (L) 08/12/2018 0607   Lipase     Component Value Date/Time   LIPASE 12 07/25/2018 0500       Studies/Results: No results found.  Anti-infectives: Anti-infectives (From admission, onward)   Start     Dose/Rate Route Frequency Ordered Stop   08/12/18 1600  Ampicillin-Sulbactam (UNASYN) 3 g in sodium chloride 0.9 % 100 mL IVPB     3 g 200 mL/hr over 30 Minutes Intravenous Every 6 hours 08/12/18 1358     08/09/18 0900  piperacillin-tazobactam (ZOSYN) IVPB 3.375 g  Status:  Discontinued     3.375 g 12.5 mL/hr over 240 Minutes Intravenous Every 8 hours 08/09/18 0822 08/12/18 1358   08/06/18 1400  fluconazole (DIFLUCAN) tablet 150 mg     150 mg Oral Daily 08/06/18 1205 08/08/18 0941   07/29/18 0600  meropenem (MERREM) 1 g in sodium chloride 0.9 % 100 mL IVPB  Status:  Discontinued     1 g 200 mL/hr over 30 Minutes Intravenous Every 12 hours 07/29/18 0457 08/03/18 1048   07/29/18  0500  ceFEPIme (MAXIPIME) 2 g in sodium chloride 0.9 % 100 mL IVPB  Status:  Discontinued     2 g 200 mL/hr over 30 Minutes Intravenous Every 24 hours 07/29/18 0436 07/29/18 0457   07/26/18 1400  piperacillin-tazobactam (ZOSYN) IVPB 3.375 g  Status:  Discontinued     3.375 g 12.5 mL/hr over 240 Minutes Intravenous Every 6 hours 07/26/18 0906 07/26/18 1339   07/26/18 1400  cefTRIAXone (ROCEPHIN) 1 g in sodium chloride 0.9 % 100 mL IVPB  Status:  Discontinued     1 g 200 mL/hr over 30 Minutes Intravenous Every 24 hours 07/26/18 1339 07/29/18 0436   07/23/18 0200  piperacillin-tazobactam (ZOSYN) IVPB 2.25 g  Status:  Discontinued     2.25 g 100 mL/hr over 30 Minutes Intravenous Every 6 hours 07/22/18 2248 07/26/18 0906   07/22/18 1800  piperacillin-tazobactam (ZOSYN) IVPB  3.375 g  Status:  Discontinued     3.375 g 12.5 mL/hr over 240 Minutes Intravenous Every 8 hours 07/22/18 0953 07/22/18 2248   07/22/18 1000  piperacillin-tazobactam (ZOSYN) IVPB 3.375 g     3.375 g 100 mL/hr over 30 Minutes Intravenous  Once 07/22/18 0953 07/22/18 1239   07/22/18 0745  ciprofloxacin (CIPRO) IVPB 400 mg     400 mg 200 mL/hr over 60 Minutes Intravenous  Once 07/22/18 0739 07/22/18 0921   07/22/18 0745  metroNIDAZOLE (FLAGYL) IVPB 500 mg     500 mg 100 mL/hr over 60 Minutes Intravenous  Once 07/22/18 0739 07/22/18 1024       Assessment/Plan RLL pneumonia - Rx with Merrem Completed VDRF,resolved Type 2 diabetes Hypothyroid- on Synthroid Rheumatoid arthritis OSA Morbid obesity BMI 44 AKI - Cr trending down Malnutrition - prealbumin10.7 yesterday -nutrition consult/calorie count Klebsiella UTI, resolved Anemia - being transfused 6/29 Thrombocytopenia -resolved  Septic shock Ischemic colitis, acute cholecystitis S/pExploratory laparotomy, total abdominal colectomy, [name], cholecystectomy-6/10-Dr. Derrell Lolling - POD#20 -CT scan 6/26:Loculated air-fluid collection anterior left hemidiaphragm adjacent small bowel measuring 15.7 x 7.7 x 4.8 cm - S/p IR drain 08/09/18 - Midline wound VAC discontinued 08/11/2018-wet-to-dryTID - Having decent bowel function currently. -Onsoft diet,TPN discontinued,poor oral intake on calorie count- nutrition consult pending, patient states she is eating about 50% of meals - work on mobilizing more -concern for rectal stump leak, rectal drainage appears to be decreasing   ID -Meropenem completed,Diflucan 6/24-6/26; PO Diflucan 6/23>6/26, zosyn 6/26>6/30; Unasyn 6/30>> FEN -IVF,soft diet VTE -SCDs, no chemical prophylaxis in setting of anemia Follow up -Dr. Marnette Burgess - son Onalee Hua [phone], husband Henreitta Cea [phone]  LOS: 22 days    Wells Guiles , [facility]  Surgery 08/13/2018, 9:59 AM Pager: [phone] Consults: [phone]

## 2018-08-13 NOTE — TOC Progression Note (Signed)
Transition of Care [facility]) - Progression Note    Patient Details  Name: Hailey Hughes MRN: [id number] Date of Birth: Apr 29, 1940  Transition of Care [facility]) CM/SW Contact  Rob Mciver, Juliann Pulse, RN Phone Number: 08/13/2018, 3:15 PM  Clinical Narrative: Received call from son Liberty Handy is aware of choices to choose from-[place], [place], [place] choice.      Expected Discharge Plan: [place] Barriers to Discharge: Continued Medical Work up  Expected Discharge Plan and Services Expected Discharge Plan: [place]   Discharge Planning Services: CM Consult   Living arrangements for the past 2 months: Single Family Home Expected Discharge Date: (unknown)               DME Arranged: N/A DME Agency: NA       HH Arranged: NA HH Agency: NA         Social Determinants of Health (SDOH) Interventions    Readmission Risk Interventions No flowsheet data found.

## 2018-08-13 NOTE — TOC Progression Note (Signed)
Transition of Care [facility]) - Progression Note    Patient Details  Name: PAIZLEY RAMELLA MRN: [id number] Date of Birth: 1940-06-25  Transition of Care [facility]) CM/SW Contact  Kinneth Fujiwara, Juliann Pulse, RN Phone Number: 08/13/2018, 12:06 PM  Clinical Narrative:  [facility] [address] [place], [place] [address] [phone] 2 out of 5 stars Below Average 2 out of 5 stars Below Average 1 out of 5 stars Much Below Average 5 out of 5 stars Much Above Average  [facility] [place] [address] [place], [place] [address] [phone] 1 out of 5 stars Much Below Average 1 out of 5 stars Much Below Average 2 out of 5 stars Below Average 1 out of 5 stars Much Below Average 1.62 [place], [place] [address] [phone] 5 out of 5 stars Much Above Average 4 out of 5 stars Above Average 5 out of 5 stars Much Above Average 5 out of 5 stars Much Above Average 1.77 [employer] [place], [place] [address] [phone] 2 out of 5 stars Below Average 2 out of 5 stars Below Average 2 out of 5 stars Below Average 2 out of 5 stars Below Average 2.65 [place] [address] [place], [place] [address] [phone] 2 out of 5 stars Below Average 2 out of 5 stars Below Average 2 out of 5 stars Below Average 2 out of 5 stars Below Average 2.72 [facility] [place], [place] [address] [phone] 5 out of 5 stars Much Above Average 5 out of 5 stars Much Above Average 5 out of 5 stars Much Above Average 5 out of 5 stars Much Above Average 3.91 [facility] [address] [place], [place] [address] [phone] 1 out of 5 stars Much Below Average 1 out of 5 stars Much Below Average 2 out of 5 stars Below Average 3 out  of 5 stars Average 4.01 [place] home has been cited for abuse. For more information about this, please click, "About Nursing Home Compare" at the top of this page. [place], [place] [address] [phone] 1 out of 5 stars Much Below Average 1 out of 5 stars Much Below Average 2 out of 5 stars Below Average 1 out of 5 stars Much Below Average 4.42 [place] home has been cited for abuse. For more information about this, please click, "About Nursing Home Compare" at the top of this page. [place], [place] [address] [phone] 2 out of 5 stars Below Average 2 out of 5 stars Below Average 2 out of 5 stars Below Average 3 out of 5 stars Average 4.63 [place], [place] [address] [phone] 5 out of 5 stars Much Above Average 4 out of 5 stars Above Average 5 out of 5 stars Much Above Average 5 out of 5 stars Much Above Average 4.94 [facility] [address] [place], [place] [address] [phone] 2 out of 5 stars Below Average 2 out of 5 stars Below Average 2 out of 5 stars Below Average 3 out of 5 stars Average 5.19 [place] [address] [place], [place] [address] [phone]  [phone] 5 out of 5 stars Much Above Average 5 out of 5 stars Much Above Average 3 out of 5 stars Average 2 out of 5 stars Below Average 5.33 [facility] [address] [place], [place] [address] [phone] 2 out of 5 stars Below Average 2 out of 5 stars Below Average 2 out of 5 stars Below Average 4 out of 5 stars Above Average 6.33 [facility] [address] [place], [place] [address] [phone] 3 out of 5 stars Average 3 out of 5 stars Average 2 out of 5 stars Below Average 3 out of 5  stars Average 7.11 [facility] [address] [place], [place] [address] [phone] 5 out of 5 stars Much Above Average 4 out of 5 stars Above Average 2 out of 5 stars Below Average 5 out of 5 stars Much Above Average 10.82 [facility] RE[facility] [address] [place], [place] [address] [phone] 1 out of 5 stars Much Below Average 1 out of 5 stars Much Below Average 2 out of 5 stars Below Average 3 out of 5 stars Average 11.16 [facility]   Expected Discharge Plan: Skilled Nursing Facility Barriers to Discharge: Continued Medical Work up  Expected Discharge Plan and Services Expected Discharge Plan: Skilled Nursing Facility   Discharge Planning Services: CM Consult   Living arrangements for the past 2 months: Single Family Home Expected Discharge Date: (unknown)               DME Arranged: N/A DME Agency: NA       HH Arranged: NA HH Agency: NA         Social Determinants of Health (SDOH) Interventions    Readmission Risk Interventions No flowsheet data found.

## 2018-08-13 NOTE — Progress Notes (Signed)
Referring Physician(s): Connor,C  Supervising Physician: Simonne Come  Patient Status:  [facility] OP  Chief Complaint: Abdominal pain/abscess   Subjective: Patient without acute changes   Allergies: Bee venom and Epinephrine  Medications: Prior to Admission medications   Medication Sig Start Date End Date Taking? Authorizing Provider  amLODipine-benazepril (LOTREL) 5-20 MG per capsule Take 1 capsule by mouth every evening.    Yes [provider]  aspirin 81 MG tablet Take 81 mg by mouth every evening.    Yes [provider]  atorvastatin (LIPITOR) 20 MG tablet Take 20 mg by mouth every evening.    Yes [provider]  bumetanide (BUMEX) 2 MG tablet Take 2 mg by mouth every evening.    Yes [provider]  celecoxib (CELEBREX) 200 MG capsule Take 200 mg by mouth every evening.    Yes [provider]  febuxostat (ULORIC) 40 MG tablet Take 40 mg by mouth every evening. 12/26/17  Yes [provider]  fexofenadine (ALLEGRA) 180 MG tablet Take 180 mg by mouth daily as needed for allergies.    Yes [provider]  fluticasone (FLONASE) 50 MCG/ACT nasal spray Place 2 sprays into both nostrils daily as needed for allergies.  12/30/17  Yes [provider]  HYDROcodone-acetaminophen (NORCO) 7.5-325 MG tablet Take 1 tablet by mouth 3 (three) times daily as needed. 02/18/18  Yes [provider]  levothyroxine (SYNTHROID, LEVOTHROID) 112 MCG tablet Take 112 mcg by mouth daily.   Yes [provider]  metFORMIN (GLUCOPHAGE-XR) 500 MG 24 hr tablet Take 1,000 mg by mouth every evening.  06/26/17  Yes [provider]  metoprolol succinate (TOPROL-XL) 50 MG 24 hr tablet Take 50 mg by mouth daily. 12/30/17  Yes [provider]  montelukast (SINGULAIR) 10 MG tablet Take 10 mg by mouth daily as needed (allergies).    Yes [provider]  olopatadine (PATANOL) 0.1 % ophthalmic solution Place 1  drop into both eyes daily as needed for allergies.  12/30/17  Yes [provider]  pantoprazole (PROTONIX) 40 MG tablet Take 1 tablet (40 mg total) by mouth daily. 02/09/14  Yes Hart Carwin, MD  senna (SENOKOT) 8.6 MG tablet Take 1 tablet by mouth daily as needed for constipation.   Yes [provider]  tiZANidine (ZANAFLEX) 4 MG capsule Take 4 mg by mouth 2 (two) times daily as needed for muscle spasms.    Yes [provider]  tobramycin-dexamethasone (TOBRADEX) ophthalmic ointment Place 1 application into both eyes 2 (two) times daily.   Yes [provider]  triamcinolone cream (KENALOG) 0.1 % Apply 1 application topically daily as needed (ezcema).  05/29/17  Yes [provider]     Vital Signs: BP (!) 125/56 (BP Location: Left Arm)    Pulse 97    Temp 98 F (36.7 C) (Oral)    Resp 14    Ht 5\' 2"  (1.575 m)    Wt 242 lb 11.6 oz (110.1 kg)    LMP  (LMP Unknown)    SpO2 96%    BMI 44.40 kg/m   Physical Exam awake, alert.  Left abdominal drain intact, insertion site okay, mildly tender to palpation, output 120 cc beige-colored fluid mixed with air  Imaging: Ct Image Guided Drainage By Percutaneous Catheter  Result Date: 08/10/2018 INDICATION: 78 year old female with a history abdominal abscess EXAM: CT GUIDED DRAINAGE OF ABSCESS MEDICATIONS: The patient is currently admitted to the hospital and receiving intravenous antibiotics. The  antibiotics were administered within an appropriate time frame prior to the initiation of the procedure. ANESTHESIA/SEDATION: 1.0 mg IV Versed 50 mcg IV Fentanyl Moderate Sedation Time:  24 The patient was continuously monitored during the procedure by the interventional radiology nurse under my direct supervision. COMPLICATIONS: None TECHNIQUE: Informed written consent was obtained from the patient after a thorough discussion of the procedural risks, benefits and alternatives. All questions were addressed. Maximal Sterile  Barrier Technique was utilized including caps, mask, sterile gowns, sterile gloves, sterile drape, hand hygiene and skin antiseptic. A timeout was performed prior to the initiation of the procedure. PROCEDURE: The operative field was prepped with chlorhexidine in a sterile fashion, and a sterile drape was applied covering the operative field. A sterile gown and sterile gloves were used for the procedure. Local anesthesia was provided with 1% Lidocaine. Once a scout CT was acquired of the abdomen, we targeted the air in fluid collection in the left abdomen. The patient was prepped and draped in the usual sterile fashion. 1% lidocaine was used for local anesthesia. Using CT guidance, modified Seldinger technique was used to place a 10 [place] drain into the fluid and gas collection within the left abdomen. Frankly purulent material was aspirated. Catheter was sutured in position and attached to bulb suction. Patient tolerated the procedure well and remained hemodynamically stable throughout. No complications were encountered and no significant blood loss. FINDINGS: Scout CT demonstrates surgical changes with persisting fluid and gas collection in the left abdomen. This was the collection targeted. Images demonstrate safe placement of 10 French drain into the fluid collection from left anterior approach. IMPRESSION: Status post CT-guided drainage of left abdominal abscess. Signed, Dulcy Fanny. Dellia Nims, RPVI [facility] Radiology Electronically Signed   By: Corrie Mckusick D.O.   On: 08/10/2018 08:25    Labs:  CBC: Recent Labs    08/09/18 0455 08/10/18 0338 08/11/18 0422 08/11/18 1802 08/12/18 0607  WBC 11.4* 8.7 7.6  --  8.3  HGB 7.7* 7.0* 6.6* 8.2* 8.2*  HCT 25.4* 23.8* 22.5* 26.6* 26.4*  PLT 404* 418* 369  --  341    COAGS: No results for input(s): INR, APTT in the last 8760 hours.  BMP: Recent Labs    08/09/18 0455 08/10/18 0338 08/11/18 0422  08/12/18 0607  NA 142 139 142 139  K 5.0 4.4 4.5 4.1  CL 107 112* 115* 111  CO2 19* 19* 21* 20*  GLUCOSE 91 124* 110* 118*  BUN 47* 40* 36* 26*  CALCIUM 8.8* 8.2* 8.1* 8.1*  CREATININE 1.00 1.10* 1.11* 1.08*  GFRNONAA 53* 47* 47* 48*  GFRAA >60 55* 54* 56*    LIVER FUNCTION TESTS: Recent Labs    07/31/18 0500 08/04/18 0430 08/07/18 0408 08/12/18 0607  BILITOT 0.2* 0.5 0.2* 0.6  AST 23 35 37 37  ALT 13 33 33 40  ALKPHOS 51 99 111 109  PROT 4.1* 5.4* 5.2* 5.4*  ALBUMIN 1.3* 1.7* 1.6* 1.7*    Assessment and Plan: Pt with hx septic shock, ischemic colitis, acute cholecystitis S/pExploratory laparotomy, total abdominal colectomy, [name], cholecystectomy-6/10-Dr. Dalbert Batman; s/p left abd abscess drain placement 6/27; afebrile; no new labs, OOB/PT; cont current tx; monitor labs closely; check f/u CT once drain output declines or sooner if clinical status worsens  Electronically Signed: D. Rowe Robert, PA-C 08/13/2018, 9:41 AM   I spent a total of 15 minutes at the the patient's bedside AND on the patient's hospital floor or unit, greater  than 50% of which was counseling/coordinating care for     Patient ID: Hailey Hughes, female   DOB: April 27, 1940, 78 y.o.   MRN: [id number]

## 2018-08-13 NOTE — TOC Progression Note (Signed)
Transition of Care [facility]) - Progression Note    Patient Details  Name: TUERE NWOSU MRN: [id number] Date of Birth: Jul 05, 1940  Transition of Care [facility]) CM/SW Contact  Lamica Mccart, Juliann Pulse, RN Phone Number: 08/13/2018, 12:40 PM  Clinical Narrative:  Bed offers given to patient, also left VM w/son Shanon Brow [phone]-await call back.     Expected Discharge Plan: [place] Barriers to Discharge: Continued Medical Work up  Expected Discharge Plan and Services Expected Discharge Plan: [place]   Discharge Planning Services: CM Consult   Living arrangements for the past 2 months: Single Family Home Expected Discharge Date: (unknown)               DME Arranged: N/A DME Agency: NA       HH Arranged: NA HH Agency: NA         Social Determinants of Health (SDOH) Interventions    Readmission Risk Interventions No flowsheet data found.

## 2018-08-13 NOTE — Consult Note (Signed)
[place] Nurse ostomy follow up Patient receiving care in [place]. Stoma type/location: RMQ ileostomy Stomal assessment/size: oval, slightly budded, dark pink, moist, producing pudding stool.  1 3/8 inches side to side, slightly less top to bottom Peristomal assessment: intact Treatment options for stomal/peristomal skin: barrier ring Ostomy pouching: 1pc. convex Education provided: With great difficulty, patient was able to open/ burp/ and close the existing pouch.  She was not able to perform any additional steps, but she did watch.  She will need rehab for strengthening and continued ostomy support after discharge.  Enrolled patient in [place] Discharge program: Yes, previously. Additional pouches and barrier rings at bedside.  Val Riles, RN, MSN, CWOCN, CNS-BC, pager [phone]

## 2018-08-13 NOTE — Progress Notes (Signed)
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 Her condition rapidly deteriorated needing transfer to ICU on day of admission for persistent hypotension and lactic acidosis with no response with aggressive IV fluid resuscitation and consistent with septic shock as she ultimately required pressor support, intubation for acute proximal respiratory failure, acute renal failure.Patient underwent exploratory laparotomy on 6/10 to progressively worsening abdominal exam concerning for peritonitis and was found to have ischemic colitis requiring abdominal colectomy, ileostomy placement and cholecystectomy.   Hospital course complicated by prolonged weaning of pressor support, concern for aspiration given hypoxia and elevated requirement (6/14), and change in mentation though CT head at that time remained negative attributed to Dilaudid. Most recently found to have fluid collection in her abdomen. Underwent aspiration and drain placement by interventional radiology on 6/27.  New events last 24 hours / Subjective: Ate about 50% of breakfast. No new complaints today.   Assessment & Plan:   Principal Problem:   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 Active Problems:   Hypothyroidism   History of Hirschsprung's disease   Hypotension   Elevated lactic acid level   CKD (chronic kidney disease) stage 3, GFR 30-59 ml/min (HCC)   Diabetes (HCC)   Hyperlipidemia   Acute respiratory failure (HCC)   Sepsis (HCC)    Pressure injury of skin   Moderate protein malnutrition (HCC)   Hyperphosphatemia   Abdominal fluid collection   Ischemic colitis/acute cholecystitis-s/p total abdominal colectomy, [name], cholecystectomy 6/10:Wound VAC removed 6/29. Being followed by general surgery and wound. JP drain x2 place with pus drainage once output slows down surgery is planning to repeat CT scan.  Now on oral diet. TPN weaned off. Calorie count.   Left abdominal abscess:  Foul smelling vac output noted so CT abd was done, which showed loculated fluid collection concerning for abscess s/p aspiration and drain placement by IR on 6/27 and Zosyn started for 5 days on 6/27.Wound culture growing Enterococcus faecalis-sensitive to Unasyn/vancomycin.  Follow-up on culture. WBC is stable afebrile and clinically improving. On unasyn  Anemia of chronic disease S/p 1 unit PRBC 6/29 and improved. No acute blood loss noted. Iron panel consistent with anemia of chronic disease. Hgb stable.   Moderate protein calorie malnutrition in patient with morbid obesity. Secondary to patient's abdominal surgery/poor absorption.Prealbumin 11.5. Off TPN and tolerating diet, on calorie count appreciate dietitian input. Meets criteria for morbid obesity with BMI greater than 40.    Septic shock, resolved. Likely bacterial translocation from ischemic colitis, no longer requiring pressor support.  Currently on Unasyn for left abdominal abscess.  Acute hypoxic respiratory failure, resolved. Now on RA. she was Intubated on 6/10, extubated on 6/15. Suspected aspiration pneumonia (right lower lobe consolidation on imaging) as well as severe sepsis from colitis.  Encourage ambulation/IS   AKI on CKD, Stage 3 resolved.Peak creatinine of 3. AKI from ATN. Baseline 1.4-1.6 (per care everywhere). Avoid nephrotoxins   Type 2 diabetes. Stable. Continue sliding scale.  Hypothyroidism Continue Synthroid   Deconditioning, continue to  work with PT/OT recs SNF once medically stable.  Pressure injury of sacrum, not present on admission.Skin intact, continue local care, continue offloading.  Bilateral ankle pain: Uric acid level normal.  Continue pain control.   DVT prophylaxis: SCD Code Status: Full Family Communication: Spoke with son over the phone for update this morning Disposition Plan: SNF placement pending general surgery recommendation for discharge    Consultants:   PCCM  General surgery  IR  Procedures:   Exploratory laparotomy, total abdominal colectomy, ileostomy, cholecystectomy 6/10 - Dr. Derrell LollingIngram   Aspiration of residual fluid plus drain placement by IR 6/27  Antimicrobials:  Anti-infectives (From admission, onward)   Start     Dose/Rate Route Frequency Ordered Stop   08/12/18 1600  Ampicillin-Sulbactam (UNASYN) 3 g in sodium chloride 0.9 % 100 mL IVPB     3 g 200 mL/hr over 30 Minutes Intravenous Every 6 hours 08/12/18 1358     08/09/18 0900  piperacillin-tazobactam (ZOSYN) IVPB 3.375 g  Status:  Discontinued     3.375 g 12.5 mL/hr over 240 Minutes Intravenous Every 8 hours 08/09/18 0822 08/12/18 1358   08/06/18 1400  fluconazole (DIFLUCAN) tablet 150 mg     150 mg Oral Daily 08/06/18 1205 08/08/18 0941   07/29/18 0600  meropenem (MERREM) 1 g in sodium chloride 0.9 % 100 mL IVPB  Status:  Discontinued     1 g 200 mL/hr over 30 Minutes Intravenous Every 12 hours 07/29/18 0457 08/03/18 1048   07/29/18 0500  ceFEPIme (MAXIPIME) 2 g in sodium chloride 0.9 % 100 mL IVPB  Status:  Discontinued     2 g 200 mL/hr over 30 Minutes Intravenous Every 24 hours 07/29/18 0436 07/29/18 0457   07/26/18 1400  piperacillin-tazobactam (ZOSYN) IVPB 3.375 g  Status:  Discontinued     3.375 g 12.5 mL/hr over 240 Minutes Intravenous Every 6 hours 07/26/18 0906 07/26/18 1339   07/26/18 1400  cefTRIAXone (ROCEPHIN) 1 g in sodium chloride 0.9 % 100 mL IVPB  Status:  Discontinued     1 g 200 mL/hr over 30  Minutes Intravenous Every 24 hours 07/26/18 1339 07/29/18 0436   07/23/18 0200  piperacillin-tazobactam (ZOSYN) IVPB 2.25 g  Status:  Discontinued     2.25 g 100 mL/hr over 30 Minutes Intravenous Every 6 hours 07/22/18 2248 07/26/18 0906   07/22/18 1800  piperacillin-tazobactam (ZOSYN) IVPB 3.375 g  Status:  Discontinued     3.375 g 12.5 mL/hr over 240 Minutes Intravenous Every 8 hours 07/22/18 0953 07/22/18 2248   07/22/18 1000  piperacillin-tazobactam (ZOSYN) IVPB 3.375 g     3.375 g 100 mL/hr over 30 Minutes Intravenous  Once 07/22/18 0953 07/22/18 1239   07/22/18 0745  ciprofloxacin (CIPRO) IVPB 400 mg     400 mg 200 mL/hr over 60 Minutes Intravenous  Once 07/22/18 0739 07/22/18 0921   07/22/18 0745  metroNIDAZOLE (FLAGYL) IVPB 500 mg     500 mg 100 mL/hr over 60 Minutes Intravenous  Once 07/22/18 0739 07/22/18 1024        Objective: Vitals:   08/12/18 1313 08/12/18 1500 08/12/18 2107 08/13/18 0545  BP: (!) 141/63  138/62 (!) 125/56  Pulse: 91  96 97  Resp: (!) 24 20 18 14   Temp: 98.3 F (36.8 C)  98.9 F (37.2 C) 98 F (36.7 C)  TempSrc: Oral  Oral Oral  SpO2: 93%  95% 96%  Weight:      Height:        Intake/Output Summary (Last 24 hours) at 08/13/2018 1028 Last  data filed at 08/13/2018 0500 Gross per 24 hour  Intake 495.02 ml  Output 165 ml  Net 330.02 ml   Filed Weights   07/30/18 0910 07/31/18 1016 08/07/18 0551  Weight: 112.3 kg 107.2 kg 110.1 kg    Examination:  General exam: Appears calm and comfortable  Respiratory system: Clear to auscultation. Respiratory effort normal. Cardiovascular system: S1 & S2 heard, RRR. No JVD, murmurs, rubs, gallops or clicks. No pedal edema. Gastrointestinal system: Abdomen is nondistended, soft and nontender. +ileostomy and 2 drains in place  Central nervous system: Alert and oriented. No focal neurological deficits. Extremities: Symmetric 5 x 5 power. Skin: No rashes, lesions or ulcers Psychiatry: Judgement and insight  appear normal. Mood & affect appropriate.   Data Reviewed: I have personally reviewed following labs and imaging studies  CBC: Recent Labs  Lab 08/08/18 1544 08/09/18 0455 08/10/18 0338 08/11/18 0422 08/11/18 1802 08/12/18 0607  WBC 10.0 11.4* 8.7 7.6  --  8.3  NEUTROABS  --  8.6* 6.4 4.8  --   --   HGB 7.7* 7.7* 7.0* 6.6* 8.2* 8.2*  HCT 25.8* 25.4* 23.8* 22.5* 26.6* 26.4*  MCV 95.9 93.7 93.0 93.4  --  91.3  PLT 392 404* 418* 369  --  341   Basic Metabolic Panel: Recent Labs  Lab 08/07/18 0408 08/08/18 0449 08/09/18 0455 08/10/18 0338 08/11/18 0422 08/12/18 0607  NA 141 143 142 139 142 139  K 5.1 4.9 5.0 4.4 4.5 4.1  CL 115* 115* 107 112* 115* 111  CO2 22 22 19* 19* 21* 20*  GLUCOSE 154* 111* 91 124* 110* 118*  BUN 65* 55* 47* 40* 36* 26*  CREATININE 1.28* 0.99 1.00 1.10* 1.11* 1.08*  CALCIUM 8.2* 8.3* 8.8* 8.2* 8.1* 8.1*  MG 2.1  --   --   --   --   --   PHOS 5.0* 3.8  --   --   --   --    GFR: Estimated Creatinine Clearance: 48.6 mL/min (A) (by C-G formula based on SCr of 1.08 mg/dL (H)). Liver Function Tests: Recent Labs  Lab 08/07/18 0408 08/12/18 0607  AST 37 37  ALT 33 40  ALKPHOS 111 109  BILITOT 0.2* 0.6  PROT 5.2* 5.4*  ALBUMIN 1.6* 1.7*   No results for input(s): LIPASE, AMYLASE in the last 168 hours. No results for input(s): AMMONIA in the last 168 hours. Coagulation Profile: No results for input(s): INR, PROTIME in the last 168 hours. Cardiac Enzymes: No results for input(s): CKTOTAL, CKMB, CKMBINDEX, TROPONINI in the last 168 hours. BNP (last 3 results) No results for input(s): PROBNP in the last 8760 hours. HbA1C: No results for input(s): HGBA1C in the last 72 hours. CBG: Recent Labs  Lab 08/12/18 0753 08/12/18 1219 08/12/18 1641 08/12/18 2110 08/13/18 0807  GLUCAP 105* 102* 126* 119* 122*   Lipid Profile: No results for input(s): CHOL, HDL, LDLCALC, TRIG, CHOLHDL, LDLDIRECT in the last 72 hours. Thyroid Function Tests: No  results for input(s): TSH, T4TOTAL, FREET4, T3FREE, THYROIDAB in the last 72 hours. Anemia Panel: No results for input(s): VITAMINB12, FOLATE, FERRITIN, TIBC, IRON, RETICCTPCT in the last 72 hours. Sepsis Labs: No results for input(s): PROCALCITON, LATICACIDVEN in the last 168 hours.  Recent Results (from the past 240 hour(s))  Aerobic/Anaerobic Culture (surgical/deep wound)     Status: None   Collection Time: 08/09/18  2:52 PM   Specimen: Abscess  Result Value Ref Range Status   Specimen Description   Final  ABSCESS Performed at [facility], [place] [address], [place], [place] [address]    Special Requests   Final    NONE Performed at [facility], [place] [address] [place], [place] [address]    Gram Stain   Final    ABUNDANT WBC PRESENT, PREDOMINANTLY PMN ABUNDANT GRAM POSITIVE COCCI IN PAIRS ABUNDANT GRAM NEGATIVE RODS Performed at [facility], [place] [address], [place], [place] [address]    Culture   Final    ABUNDANT ENTEROCOCCUS FAECALIS FEW MIXED ANAEROBIC FLORA PRESENT.  CALL LAB IF FURTHER IID REQUIRED.    Report Status 08/12/2018 FINAL  Final   Organism ID, Bacteria ENTEROCOCCUS FAECALIS  Final      Susceptibility   Enterococcus faecalis - MIC*    AMPICILLIN <=2 SENSITIVE Sensitive     VANCOMYCIN 2 SENSITIVE Sensitive     GENTAMICIN SYNERGY SENSITIVE Sensitive     * ABUNDANT ENTEROCOCCUS FAECALIS       Radiology Studies: No results found.    Scheduled Meds: . sodium chloride   Intravenous Once  . acetaminophen  1,000 mg Oral Q6H  . Chlorhexidine Gluconate Cloth  6 each Topical Daily  . feeding supplement (ENSURE ENLIVE)  237 mL Oral TID BM  . feeding supplement (PRO-STAT SUGAR FREE 64)  30 mL Oral TID  . insulin aspart  0-15 Units Subcutaneous TID AC & HS  . levothyroxine  112 mcg Oral Q0600  . mouth rinse  15 mL Mouth Rinse BID  . nystatin  5 mL Oral QID  . nystatin   Topical QODAY  . olopatadine  1 drop Both  Eyes BID  . pantoprazole  40 mg Oral QHS  . sodium chloride flush  10-40 mL Intracatheter Q12H  . sodium chloride flush  5 mL Intracatheter Q8H  . tobramycin-dexamethasone  1 application Both Eyes BID   Continuous Infusions: . sodium chloride 250 mL (08/11/18 0037)  . ampicillin-sulbactam (UNASYN) IV 3 g (08/13/18 0500)     LOS: 22 days    Time spent: 35 minutes   Dessa Phi, DO [facility] [email or web address] 08/13/2018, 10:28 AM

## 2018-08-14 LAB — CBC
HCT: 26.2 % — ABNORMAL LOW (ref 36.0–46.0)
Hemoglobin: 7.7 g/dL — ABNORMAL LOW (ref 12.0–15.0)
MCH: 27.8 pg (ref 26.0–34.0)
MCHC: 29.4 g/dL — ABNORMAL LOW (ref 30.0–36.0)
MCV: 94.6 fL (ref 80.0–100.0)
Platelets: 289 10*3/uL (ref 150–400)
RBC: 2.77 MIL/uL — ABNORMAL LOW (ref 3.87–5.11)
RDW: 14.6 % (ref 11.5–15.5)
WBC: 7.3 10*3/uL (ref 4.0–10.5)
nRBC: 0 % (ref 0.0–0.2)

## 2018-08-14 LAB — BASIC METABOLIC PANEL
Anion gap: 6 (ref 5–15)
BUN: 27 mg/dL — ABNORMAL HIGH (ref 8–23)
CO2: 20 mmol/L — ABNORMAL LOW (ref 22–32)
Calcium: 8 mg/dL — ABNORMAL LOW (ref 8.9–10.3)
Chloride: 112 mmol/L — ABNORMAL HIGH (ref 98–111)
Creatinine, Ser: 1.29 mg/dL — ABNORMAL HIGH (ref 0.44–1.00)
GFR calc Af Amer: 45 mL/min — ABNORMAL LOW (ref >60)
GFR calc non Af Amer: 39 mL/min — ABNORMAL LOW (ref >60)
Glucose, Bld: 145 mg/dL — ABNORMAL HIGH (ref 70–99)
Potassium: 4.5 mmol/L (ref 3.5–5.1)
Sodium: 138 mmol/L (ref 135–145)

## 2018-08-14 LAB — GLUCOSE, CAPILLARY
Glucose-Capillary: 127 mg/dL — ABNORMAL HIGH (ref 70–99)
Glucose-Capillary: 190 mg/dL — ABNORMAL HIGH (ref 70–99)
Glucose-Capillary: 140 mg/dL — ABNORMAL HIGH (ref 70–99)
Glucose-Capillary: 156 mg/dL — ABNORMAL HIGH (ref 70–99)

## 2018-08-14 NOTE — Progress Notes (Signed)
Referring Physician(s): Phylliss Blakes  Supervising Physician: Simonne Come  Patient Status:  [facility] - In-pt  Chief Complaint: None  Subjective:  Intraabdominal abscess s/p LLQ drain placement 08/09/2018 by Dr. Loreta Ave. Patient awake and alert laying in bed with no complaints at this time. LLQ drain site c/d/i.   Allergies: Bee venom and Epinephrine  Medications: Prior to Admission medications   Medication Sig Start Date End Date Taking? Authorizing Provider  amLODipine-benazepril (LOTREL) 5-20 MG per capsule Take 1 capsule by mouth every evening.    Yes [provider]  aspirin 81 MG tablet Take 81 mg by mouth every evening.    Yes [provider]  atorvastatin (LIPITOR) 20 MG tablet Take 20 mg by mouth every evening.    Yes [provider]  bumetanide (BUMEX) 2 MG tablet Take 2 mg by mouth every evening.    Yes [provider]  celecoxib (CELEBREX) 200 MG capsule Take 200 mg by mouth every evening.    Yes [provider]  febuxostat (ULORIC) 40 MG tablet Take 40 mg by mouth every evening. 12/26/17  Yes [provider]  fexofenadine (ALLEGRA) 180 MG tablet Take 180 mg by mouth daily as needed for allergies.    Yes [provider]  fluticasone (FLONASE) 50 MCG/ACT nasal spray Place 2 sprays into both nostrils daily as needed for allergies.  12/30/17  Yes [provider]  HYDROcodone-acetaminophen (NORCO) 7.5-325 MG tablet Take 1 tablet by mouth 3 (three) times daily as needed. 02/18/18  Yes [provider]  levothyroxine (SYNTHROID, LEVOTHROID) 112 MCG tablet Take 112 mcg by mouth daily.   Yes [provider]  metFORMIN (GLUCOPHAGE-XR) 500 MG 24 hr tablet Take 1,000 mg by mouth every evening.  06/26/17  Yes [provider]  metoprolol succinate (TOPROL-XL) 50 MG 24 hr tablet Take 50 mg by mouth daily. 12/30/17  Yes [provider]  montelukast (SINGULAIR) 10 MG tablet Take 10 mg  by mouth daily as needed (allergies).    Yes [provider]  olopatadine (PATANOL) 0.1 % ophthalmic solution Place 1 drop into both eyes daily as needed for allergies.  12/30/17  Yes [provider]  pantoprazole (PROTONIX) 40 MG tablet Take 1 tablet (40 mg total) by mouth daily. 02/09/14  Yes Hart Carwin, MD  senna (SENOKOT) 8.6 MG tablet Take 1 tablet by mouth daily as needed for constipation.   Yes [provider]  tiZANidine (ZANAFLEX) 4 MG capsule Take 4 mg by mouth 2 (two) times daily as needed for muscle spasms.    Yes [provider]  tobramycin-dexamethasone (TOBRADEX) ophthalmic ointment Place 1 application into both eyes 2 (two) times daily.   Yes [provider]  triamcinolone cream (KENALOG) 0.1 % Apply 1 application topically daily as needed (ezcema).  05/29/17  Yes [provider]     Vital Signs: BP 130/80 (BP Location: Left Arm)   Pulse 90   Temp 98.5 F (36.9 C)   Resp 16   Ht 5\' 2"  (1.575 m)   Wt 242 lb 11.6 oz (110.1 kg)   LMP  (LMP Unknown)   SpO2 92%   BMI 44.40 kg/m   Physical Exam Vitals signs and nursing note reviewed.  Constitutional:      General: She is not in acute distress.    Appearance: Normal appearance.  Pulmonary:     Effort: Pulmonary effort is normal. No respiratory distress.  Abdominal:     Palpations: Abdomen is  soft.     Tenderness: There is no abdominal tenderness.     Comments: LLQ drain site without tenderness, erythema, drainage, or active bleeding; drain with minimal output (80 cc yesterday) of thick light brown fluid in JP drain; drain flushes/aspirates without resistance.  Skin:    General: Skin is warm and dry.  Neurological:     Mental Status: She is alert and oriented to person, place, and time.  Psychiatric:        Mood and Affect: Mood normal.        Behavior: Behavior normal.        Thought Content: Thought content normal.        Judgment: Judgment normal.      Imaging: No results found.  Labs:  CBC: Recent Labs    08/10/18 0338 08/11/18 0422 08/11/18 1802 08/12/18 0607 08/14/18 0355  WBC 8.7 7.6  --  8.3 7.3  HGB 7.0* 6.6* 8.2* 8.2* 7.7*  HCT 23.8* 22.5* 26.6* 26.4* 26.2*  PLT 418* 369  --  341 289    COAGS: No results for input(s): INR, APTT in the last 8760 hours.  BMP: Recent Labs    08/10/18 0338 08/11/18 0422 08/12/18 0607 08/14/18 0355  NA 139 142 139 138  K 4.4 4.5 4.1 4.5  CL 112* 115* 111 112*  CO2 19* 21* 20* 20*  GLUCOSE 124* 110* 118* 145*  BUN 40* 36* 26* 27*  CALCIUM 8.2* 8.1* 8.1* 8.0*  CREATININE 1.10* 1.11* 1.08* 1.29*  GFRNONAA 47* 47* 48* 39*  GFRAA 55* 54* 56* 45*    LIVER FUNCTION TESTS: Recent Labs    07/31/18 0500 08/04/18 0430 08/07/18 0408 08/12/18 0607  BILITOT 0.2* 0.5 0.2* 0.6  AST 23 35 37 37  ALT 13 33 33 40  ALKPHOS 51 99 111 109  PROT 4.1* 5.4* 5.2* 5.4*  ALBUMIN 1.3* 1.7* 1.6* 1.7*    Assessment and Plan:  Intraabdominal abscess s/p LLQ drain placement 08/09/2018 by Dr. Earleen Newport. LLQ drain stable with minimal output (80 cc yesterday) of thick light brown fluid in JP drain. Continue with Qshift flushes/monitor of output. Plan for follow-up CT and possible drain injection when output <10 cc/day to assess for possible removal. Further plans per TRH/CCS- appreciate and agree with management. IR to follow.   Electronically Signed: Earley Abide, PA-C 08/14/2018, 10:52 AM   I spent a total of 25 Minutes at the the patient's bedside AND on the patient's hospital floor or unit, greater than 50% of which was counseling/coordinating care for intraabdominal abscess s/p LLQ drain placement.

## 2018-08-14 NOTE — Progress Notes (Signed)
[facility] Progress Note  22 Days Post-Op  Subjective: CC: no new complaints Working on eating. Denies nausea. Abdominal pain well controlled. Having stool output, worked with WOC RN on emptying pouch yesterday. Does not like the heel protectors that were put on yesterday.   Objective: Vital signs in last 24 hours: Temp:  [98.5 F (36.9 C)-98.9 F (37.2 C)] 98.5 F (36.9 C) (07/02 0437) Pulse Rate:  [90-97] 90 (07/02 0437) Resp:  [16] 16 (07/02 0437) BP: (130-137)/(58-80) 130/80 (07/02 0437) SpO2:  [89 %-92 %] 92 % (07/02 0437) Last BM Date: 08/13/18  Intake/Output from previous day: 07/01 0701 - 07/02 0700 In: 720 [P.O.:720] Out: 1380 [Urine:1000; Drains:80; Stool:300] Intake/Output this shift: No intake/output data recorded.  PE: Gen: Alert, NAD, pleasant Card: Regular rate and rhythm Pulm: Normal effort, clear to auscultation bilaterally Abd: Soft,mild generalized TTP, non-distended,+BS, midline wound with some necrotic fibrous areas but also beefy granulation tissue, drains with purulent fluid, stoma pink and working Skin: warm and dry, no rashes  Psych: A&Ox3   Lab Results:  Recent Labs    08/12/18 0607 08/14/18 0355  WBC 8.3 7.3  HGB 8.2* 7.7*  HCT 26.4* 26.2*  PLT 341 289   BMET Recent Labs    08/12/18 0607 08/14/18 0355  NA 139 138  K 4.1 4.5  CL 111 112*  CO2 20* 20*  GLUCOSE 118* 145*  BUN 26* 27*  CREATININE 1.08* 1.29*  CALCIUM 8.1* 8.0*   PT/INR No results for input(s): LABPROT, INR in the last 72 hours. CMP     Component Value Date/Time   NA 138 08/14/2018 0355   K 4.5 08/14/2018 0355   CL 112 (H) 08/14/2018 0355   CO2 20 (L) 08/14/2018 0355   GLUCOSE 145 (H) 08/14/2018 0355   BUN 27 (H) 08/14/2018 0355   CREATININE 1.29 (H) 08/14/2018 0355   CALCIUM 8.0 (L) 08/14/2018 0355   PROT 5.4 (L) 08/12/2018 0607   ALBUMIN 1.7 (L) 08/12/2018 0607   AST 37 08/12/2018 0607   ALT 40 08/12/2018 0607   ALKPHOS 109  08/12/2018 0607   BILITOT 0.6 08/12/2018 0607   GFRNONAA 39 (L) 08/14/2018 0355   GFRAA 45 (L) 08/14/2018 0355   Lipase     Component Value Date/Time   LIPASE 12 07/25/2018 0500       Studies/Results: No results found.  Anti-infectives: Anti-infectives (From admission, onward)   Start     Dose/Rate Route Frequency Ordered Stop   08/12/18 1600  Ampicillin-Sulbactam (UNASYN) 3 g in sodium chloride 0.9 % 100 mL IVPB     3 g 200 mL/hr over 30 Minutes Intravenous Every 6 hours 08/12/18 1358     08/09/18 0900  piperacillin-tazobactam (ZOSYN) IVPB 3.375 g  Status:  Discontinued     3.375 g 12.5 mL/hr over 240 Minutes Intravenous Every 8 hours 08/09/18 0822 08/12/18 1358   08/06/18 1400  fluconazole (DIFLUCAN) tablet 150 mg     150 mg Oral Daily 08/06/18 1205 08/08/18 0941   07/29/18 0600  meropenem (MERREM) 1 g in sodium chloride 0.9 % 100 mL IVPB  Status:  Discontinued     1 g 200 mL/hr over 30 Minutes Intravenous Every 12 hours 07/29/18 0457 08/03/18 1048   07/29/18 0500  ceFEPIme (MAXIPIME) 2 g in sodium chloride 0.9 % 100 mL IVPB  Status:  Discontinued     2 g 200 mL/hr over 30 Minutes Intravenous Every 24 hours 07/29/18 0436 07/29/18 0457   07/26/18 1400  piperacillin-tazobactam (ZOSYN) IVPB 3.375 g  Status:  Discontinued     3.375 g 12.5 mL/hr over 240 Minutes Intravenous Every 6 hours 07/26/18 0906 07/26/18 1339   07/26/18 1400  cefTRIAXone (ROCEPHIN) 1 g in sodium chloride 0.9 % 100 mL IVPB  Status:  Discontinued     1 g 200 mL/hr over 30 Minutes Intravenous Every 24 hours 07/26/18 1339 07/29/18 0436   07/23/18 0200  piperacillin-tazobactam (ZOSYN) IVPB 2.25 g  Status:  Discontinued     2.25 g 100 mL/hr over 30 Minutes Intravenous Every 6 hours 07/22/18 2248 07/26/18 0906   07/22/18 1800  piperacillin-tazobactam (ZOSYN) IVPB 3.375 g  Status:  Discontinued     3.375 g 12.5 mL/hr over 240 Minutes Intravenous Every 8 hours 07/22/18 0953 07/22/18 2248   07/22/18 1000   piperacillin-tazobactam (ZOSYN) IVPB 3.375 g     3.375 g 100 mL/hr over 30 Minutes Intravenous  Once 07/22/18 0953 07/22/18 1239   07/22/18 0745  ciprofloxacin (CIPRO) IVPB 400 mg     400 mg 200 mL/hr over 60 Minutes Intravenous  Once 07/22/18 0739 07/22/18 0921   07/22/18 0745  metroNIDAZOLE (FLAGYL) IVPB 500 mg     500 mg 100 mL/hr over 60 Minutes Intravenous  Once 07/22/18 0739 07/22/18 1024       Assessment/Plan RLL pneumonia - Rx with Merrem Completed VDRF,resolved Type 2 diabetes Hypothyroid- on Synthroid Rheumatoid arthritis OSA Morbid obesity BMI 44 AKI - Cr trending down Malnutrition - prealbumin10.7 yesterday-nutrition consult/calorie count Klebsiella UTI, resolved Anemia - being transfused 6/29 Thrombocytopenia -resolved  Septic shock Ischemic colitis, acute cholecystitis S/pExploratory laparotomy, total abdominal colectomy, [name], cholecystectomy-6/10-Dr. Dalbert Batman - POD#21 -CT scan 6/26:Loculated air-fluid collection anterior left hemidiaphragm adjacent small bowel measuring 15.7 x 7.7 x 4.8 cm -S/p IRdrain 08/09/18 - Midline wound VAC discontinued 08/11/2018-wet-to-dryTID - Having decent bowel function currently. -Onsoft diet,appreciate nutrition input, continue to work on increasing PO intake - work on mobilizing more -concern for rectal stump leak, rectal drainage appears to be decreasing - repeat CT tomorrow    ID -Meropenem completed,Diflucan 6/24-6/26;PODiflucan 6/23>6/26,zosyn 6/26>6/30; Unasyn 6/30>> FEN -IVF,soft diet VTE -SCDs, no chemical prophylaxis in setting of anemia Follow up -Dr. Adline Peals - son Shanon Brow [phone], husband Hoy Morn [phone]  LOS: 23 days    Brigid Re , [facility] 08/14/2018, 8:26 AM Pager: [phone] Consults: [phone]

## 2018-08-14 NOTE — Care Management Important Message (Signed)
Important Message  Patient DetailsIM Letter given to  Velva Harman RN to present to the Patient Name: ANNAKA CLEAVER MRN: [id number] Date of Birth: February 15, 1940   Medicare Important Message Given:  Yes     Kerin Salen 08/14/2018, 10:22 AM

## 2018-08-14 NOTE — TOC Progression Note (Signed)
Transition of Care [facility]) - Progression Note    Patient Details  Name: Hailey Hughes MRN: [id number] Date of Birth: 13-Dec-1940  [facility] CM/SW Contact  Haili Donofrio, Juliann Pulse, RN Phone Number: 08/14/2018, 8:48 AM  Clinical Narrative: Son Shanon Brow chose [employer]. [facility] has accepted-tel#[phone].      Expected Discharge Plan: [place] Barriers to Discharge: Continued Medical Work up  Expected Discharge Plan and Services Expected Discharge Plan: [place]   Discharge Planning Services: CM Consult   Living arrangements for the past 2 months: Single Family Home Expected Discharge Date: (unknown)               DME Arranged: N/A DME Agency: NA       HH Arranged: NA HH Agency: NA         Social Determinants of Health (SDOH) Interventions    Readmission Risk Interventions No flowsheet data found.

## 2018-08-14 NOTE — Progress Notes (Signed)
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 Her condition rapidly deteriorated needing transfer to ICU on day of admission for persistent hypotension and lactic acidosis with no response with aggressive IV fluid resuscitation and consistent with septic shock as she ultimately required pressor support, intubation for acute proximal respiratory failure, acute renal failure.Patient underwent exploratory laparotomy on 6/10 to progressively worsening abdominal exam concerning for peritonitis and was found to have ischemic colitis requiring abdominal colectomy, ileostomy placement and cholecystectomy.   Hospital course complicated by prolonged weaning of pressor support, concern for aspiration given hypoxia and elevated requirement (6/14), and change in mentation though CT head at that time remained negative attributed to Dilaudid. Most recently found to have fluid collection in her abdomen. Underwent aspiration and drain placement by interventional radiology on 6/27.  New events last 24 hours / Subjective: Eating breakfast, no new complaints today.   Assessment & Plan:   Principal Problem:   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 Active Problems:   Hypothyroidism   History of Hirschsprung's disease   Hypotension   Elevated lactic acid level   CKD (chronic kidney disease) stage 3, GFR 30-59 ml/min (HCC)   Diabetes (HCC)   Hyperlipidemia   Acute respiratory failure (HCC)   Sepsis (HCC)  Pressure injury of skin   Moderate protein malnutrition (HCC)   Hyperphosphatemia   Abdominal fluid collection   Ischemic colitis/acute cholecystitis-s/p total abdominal colectomy, [name], cholecystectomy 6/10:Wound VAC removed 6/29. Being followed by general surgery and wound. JP drain x2 place with pus drainage once output slows down surgery is planning to repeat CT scan 7/3.  Now on oral diet. TPN weaned off. Calorie count.   Left abdominal abscess:  Foul smelling vac output noted so CT abd was done, which showed loculated fluid collection concerning for abscess s/p aspiration and drain placement by IR on 6/27 and Zosyn started for 5 days on 6/27.Wound culture growing Enterococcus faecalis-sensitive to Unasyn/vancomycin.  Follow-up on culture. WBC is stable afebrile and clinically improving. On unasyn  Anemia of chronic disease S/p 1 unit PRBC 6/29 and improved. No acute blood loss noted. Iron panel consistent with anemia of chronic disease. Hgb stable.   Moderate protein calorie malnutrition in patient with morbid obesity. Secondary to patient's abdominal surgery/poor absorption.Prealbumin 11.5. Off TPN and tolerating diet, on calorie count appreciate dietitian input. Meets criteria for morbid obesity with BMI greater than 40.    Septic shock, resolved. Likely bacterial translocation from ischemic colitis, no longer requiring pressor support.  Currently on Unasyn for left abdominal abscess.  Acute hypoxic respiratory failure, resolved. Now on RA. she was Intubated on 6/10, extubated on 6/15. Suspected aspiration pneumonia (right lower lobe consolidation on imaging) as well as severe sepsis from colitis.  Encourage ambulation/IS   AKI on CKD, Stage 3 resolved.Peak creatinine of 3. AKI from ATN. Baseline 1.4-1.6 (per care everywhere). Avoid nephrotoxins   Type 2 diabetes. Stable. Continue sliding scale.  Hypothyroidism Continue Synthroid   Deconditioning, continue  to work with PT/OT recs SNF once medically stable.  Pressure injury of sacrum,  not present on admission.Skin intact, continue local care, continue offloading.  Bilateral ankle pain: Uric acid level normal.  Continue pain control.   DVT prophylaxis: SCD Code Status: Full Family Communication: None Disposition Plan: SNF placement pending general surgery recommendation for discharge, repeat CT A/P 7/3    Consultants:   PCCM  General surgery  IR  Procedures:   Exploratory laparotomy, total abdominal colectomy, ileostomy, cholecystectomy 6/10 - Dr. Derrell LollingIngram   Aspiration of residual fluid plus drain placement by IR 6/27  Antimicrobials:  Anti-infectives (From admission, onward)   Start     Dose/Rate Route Frequency Ordered Stop   08/12/18 1600  Ampicillin-Sulbactam (UNASYN) 3 g in sodium chloride 0.9 % 100 mL IVPB     3 g 200 mL/hr over 30 Minutes Intravenous Every 6 hours 08/12/18 1358     08/09/18 0900  piperacillin-tazobactam (ZOSYN) IVPB 3.375 g  Status:  Discontinued     3.375 g 12.5 mL/hr over 240 Minutes Intravenous Every 8 hours 08/09/18 0822 08/12/18 1358   08/06/18 1400  fluconazole (DIFLUCAN) tablet 150 mg     150 mg Oral Daily 08/06/18 1205 08/08/18 0941   07/29/18 0600  meropenem (MERREM) 1 g in sodium chloride 0.9 % 100 mL IVPB  Status:  Discontinued     1 g 200 mL/hr over 30 Minutes Intravenous Every 12 hours 07/29/18 0457 08/03/18 1048   07/29/18 0500  ceFEPIme (MAXIPIME) 2 g in sodium chloride 0.9 % 100 mL IVPB  Status:  Discontinued     2 g 200 mL/hr over 30 Minutes Intravenous Every 24 hours 07/29/18 0436 07/29/18 0457   07/26/18 1400  piperacillin-tazobactam (ZOSYN) IVPB 3.375 g  Status:  Discontinued     3.375 g 12.5 mL/hr over 240 Minutes Intravenous Every 6 hours 07/26/18 0906 07/26/18 1339   07/26/18 1400  cefTRIAXone (ROCEPHIN) 1 g in sodium chloride 0.9 % 100 mL IVPB  Status:  Discontinued     1 g 200 mL/hr over 30 Minutes Intravenous Every 24  hours 07/26/18 1339 07/29/18 0436   07/23/18 0200  piperacillin-tazobactam (ZOSYN) IVPB 2.25 g  Status:  Discontinued     2.25 g 100 mL/hr over 30 Minutes Intravenous Every 6 hours 07/22/18 2248 07/26/18 0906   07/22/18 1800  piperacillin-tazobactam (ZOSYN) IVPB 3.375 g  Status:  Discontinued     3.375 g 12.5 mL/hr over 240 Minutes Intravenous Every 8 hours 07/22/18 0953 07/22/18 2248   07/22/18 1000  piperacillin-tazobactam (ZOSYN) IVPB 3.375 g     3.375 g 100 mL/hr over 30 Minutes Intravenous  Once 07/22/18 0953 07/22/18 1239   07/22/18 0745  ciprofloxacin (CIPRO) IVPB 400 mg     400 mg 200 mL/hr over 60 Minutes Intravenous  Once 07/22/18 0739 07/22/18 0921   07/22/18 0745  metroNIDAZOLE (FLAGYL) IVPB 500 mg     500 mg 100 mL/hr over 60 Minutes Intravenous  Once 07/22/18 0739 07/22/18 1024       Objective: Vitals:   08/13/18 0545 08/13/18 1336 08/13/18 2049 08/14/18 0437  BP: (!) 125/56 (!) 137/58 132/60 130/80  Pulse: 97 97 93 90  Resp: 14 16 16 16   Temp: 98 F (36.7 C) 98.8 F (37.1 C) 98.9 F (37.2 C) 98.5 F (36.9 C)  TempSrc: Oral Oral    SpO2: 96% (!) 89% 91% 92%  Weight:      Height:        Intake/Output Summary (Last 24 hours) at 08/14/2018 [id number] Last data filed at 08/14/2018 [id number] Gross per  24 hour  Intake 480 ml  Output 1180 ml  Net -700 ml   Filed Weights   07/30/18 0910 07/31/18 1016 08/07/18 0551  Weight: 112.3 kg 107.2 kg 110.1 kg    Examination: General exam: Appears calm and comfortable  Respiratory system: Clear to auscultation. Respiratory effort normal. Cardiovascular system: S1 & S2 heard, RRR. No JVD, murmurs, rubs, gallops or clicks. No pedal edema. Gastrointestinal system: Abdomen is nondistended, soft and nontender. +colostomy and +2 drains in place. Central nervous system: Alert and oriented. No focal neurological deficits. Extremities: Symmetric 5 x 5 power. Skin: No rashes, lesions or ulcers Psychiatry: Judgement and insight appear  normal. Mood & affect appropriate.    Data Reviewed: I have personally reviewed following labs and imaging studies  CBC: Recent Labs  Lab 08/09/18 0455 08/10/18 0338 08/11/18 0422 08/11/18 1802 08/12/18 0607 08/14/18 0355  WBC 11.4* 8.7 7.6  --  8.3 7.3  NEUTROABS 8.6* 6.4 4.8  --   --   --   HGB 7.7* 7.0* 6.6* 8.2* 8.2* 7.7*  HCT 25.4* 23.8* 22.5* 26.6* 26.4* 26.2*  MCV 93.7 93.0 93.4  --  91.3 94.6  PLT 404* 418* 369  --  341 289   Basic Metabolic Panel: Recent Labs  Lab 08/08/18 0449 08/09/18 0455 08/10/18 0338 08/11/18 0422 08/12/18 0607 08/14/18 0355  NA 143 142 139 142 139 138  K 4.9 5.0 4.4 4.5 4.1 4.5  CL 115* 107 112* 115* 111 112*  CO2 22 19* 19* 21* 20* 20*  GLUCOSE 111* 91 124* 110* 118* 145*  BUN 55* 47* 40* 36* 26* 27*  CREATININE 0.99 1.00 1.10* 1.11* 1.08* 1.29*  CALCIUM 8.3* 8.8* 8.2* 8.1* 8.1* 8.0*  PHOS 3.8  --   --   --   --   --    GFR: Estimated Creatinine Clearance: 40.7 mL/min (A) (by C-G formula based on SCr of 1.29 mg/dL (H)). Liver Function Tests: Recent Labs  Lab 08/12/18 0607  AST 37  ALT 40  ALKPHOS 109  BILITOT 0.6  PROT 5.4*  ALBUMIN 1.7*   No results for input(s): LIPASE, AMYLASE in the last 168 hours. No results for input(s): AMMONIA in the last 168 hours. Coagulation Profile: No results for input(s): INR, PROTIME in the last 168 hours. Cardiac Enzymes: No results for input(s): CKTOTAL, CKMB, CKMBINDEX, TROPONINI in the last 168 hours. BNP (last 3 results) No results for input(s): PROBNP in the last 8760 hours. HbA1C: No results for input(s): HGBA1C in the last 72 hours. CBG: Recent Labs  Lab 08/13/18 0807 08/13/18 1125 08/13/18 1615 08/13/18 2052 08/14/18 0726  GLUCAP 122* 112* 139* 115* 127*   Lipid Profile: No results for input(s): CHOL, HDL, LDLCALC, TRIG, CHOLHDL, LDLDIRECT in the last 72 hours. Thyroid Function Tests: No results for input(s): TSH, T4TOTAL, FREET4, T3FREE, THYROIDAB in the last 72  hours. Anemia Panel: No results for input(s): VITAMINB12, FOLATE, FERRITIN, TIBC, IRON, RETICCTPCT in the last 72 hours. Sepsis Labs: No results for input(s): PROCALCITON, LATICACIDVEN in the last 168 hours.  Recent Results (from the past 240 hour(s))  Aerobic/Anaerobic Culture (surgical/deep wound)     Status: None   Collection Time: 08/09/18  2:52 PM   Specimen: Abscess  Result Value Ref Range Status   Specimen Description   Final    ABSCESS Performed at [facility], [address], [place], [place] [address]    Special Requests   Final    NONE Performed at [facility], [address], [place], [place] [address]    Gram Stain   Final    ABUNDANT WBC PRESENT, PREDOMINANTLY PMN ABUNDANT GRAM POSITIVE COCCI IN PAIRS ABUNDANT GRAM NEGATIVE RODS Performed at [facility], [address], [place], [place] [address]    Culture   Final    ABUNDANT ENTEROCOCCUS FAECALIS FEW MIXED ANAEROBIC FLORA PRESENT.  CALL LAB IF FURTHER IID REQUIRED.    Report Status 08/12/2018 FINAL  Final   Organism ID, Bacteria ENTEROCOCCUS FAECALIS  Final      Susceptibility   Enterococcus faecalis - MIC*    AMPICILLIN <=2 SENSITIVE Sensitive     VANCOMYCIN 2 SENSITIVE Sensitive     GENTAMICIN SYNERGY SENSITIVE Sensitive     * ABUNDANT ENTEROCOCCUS FAECALIS       Radiology Studies: No results found.    Scheduled Meds: . acetaminophen  1,000 mg Oral Q6H  . Chlorhexidine Gluconate Cloth  6 each Topical Daily  . feeding supplement (ENSURE ENLIVE)  237 mL Oral TID BM  . feeding supplement (PRO-STAT SUGAR FREE 64)  30 mL Oral TID  . insulin aspart  0-15 Units Subcutaneous TID AC & HS  . levothyroxine  112 mcg Oral Q0600  . mouth rinse  15 mL Mouth Rinse BID  . nystatin  5 mL Oral QID  . nystatin   Topical QODAY  . olopatadine  1 drop Both Eyes BID  . pantoprazole  40 mg Oral QHS  . sodium chloride flush  10-40 mL Intracatheter Q12H  . sodium  chloride flush  5 mL Intracatheter Q8H  . tobramycin-dexamethasone  1 application Both Eyes BID   Continuous Infusions: . sodium chloride 250 mL (08/11/18 0037)  . ampicillin-sulbactam (UNASYN) IV 3 g (08/14/18 0549)     LOS: 23 days    Time spent: 25 minutes   Noralee Stain, DO [facility] [email or web address] 08/14/2018, 9:39 AM

## 2018-08-15 ENCOUNTER — Inpatient Hospital Stay (HOSPITAL_COMMUNITY): Payer: Medicare Other

## 2018-08-15 LAB — CBC
HCT: 25.4 % — ABNORMAL LOW (ref 36.0–46.0)
Hemoglobin: 7.6 g/dL — ABNORMAL LOW (ref 12.0–15.0)
MCH: 27.9 pg (ref 26.0–34.0)
MCHC: 29.9 g/dL — ABNORMAL LOW (ref 30.0–36.0)
MCV: 93.4 fL (ref 80.0–100.0)
Platelets: 264 10*3/uL (ref 150–400)
RBC: 2.72 MIL/uL — ABNORMAL LOW (ref 3.87–5.11)
RDW: 14.4 % (ref 11.5–15.5)
WBC: 6.1 10*3/uL (ref 4.0–10.5)
nRBC: 0 % (ref 0.0–0.2)

## 2018-08-15 LAB — GLUCOSE, CAPILLARY
Glucose-Capillary: 105 mg/dL — ABNORMAL HIGH (ref 70–99)
Glucose-Capillary: 123 mg/dL — ABNORMAL HIGH (ref 70–99)
Glucose-Capillary: 156 mg/dL — ABNORMAL HIGH (ref 70–99)
Glucose-Capillary: 110 mg/dL — ABNORMAL HIGH (ref 70–99)

## 2018-08-15 LAB — BASIC METABOLIC PANEL
Anion gap: 4 — ABNORMAL LOW (ref 5–15)
BUN: 23 mg/dL (ref 8–23)
CO2: 23 mmol/L (ref 22–32)
Calcium: 8 mg/dL — ABNORMAL LOW (ref 8.9–10.3)
Chloride: 111 mmol/L (ref 98–111)
Creatinine, Ser: 1.01 mg/dL — ABNORMAL HIGH (ref 0.44–1.00)
GFR calc Af Amer: >60 mL/min (ref >60)
GFR calc non Af Amer: 53 mL/min — ABNORMAL LOW (ref >60)
Glucose, Bld: 120 mg/dL — ABNORMAL HIGH (ref 70–99)
Potassium: 4.2 mmol/L (ref 3.5–5.1)
Sodium: 138 mmol/L (ref 135–145)

## 2018-08-15 MED ORDER — IOHEXOL 300 MG/ML  SOLN
100.0000 mL | Freq: Once | INTRAMUSCULAR | Status: AC | PRN
  Administered 2018-08-15: 100 mL via INTRAVENOUS

## 2018-08-15 MED ORDER — IOHEXOL 300 MG/ML  SOLN: 30.0000 mL | Freq: Once | INTRAMUSCULAR | Status: DC | PRN

## 2018-08-15 MED ORDER — SODIUM CHLORIDE (PF) 0.9 % IJ SOLN
INTRAMUSCULAR | Status: AC
  Filled 2018-08-15: qty 50

## 2018-08-15 NOTE — Progress Notes (Signed)
Occupational Therapy Treatment Patient Details Name: Hailey Hughes MRN: [id number] DOB: 25-Dec-1940 Today's Date: 08/15/2018    History of present illness 78 year old white female admitted the hospital 6/9 with CT scan evidence of colitis transferred to the ICU  for septic shock, s/p colectomy and cholecystectomy 07/23/18   OT comments  Improved LUE strength and sitting balance. Needs mod/max +2 lateral scoot to chair.   Follow Up Recommendations  SNF    Equipment Recommendations  None recommended by OT    Recommendations for Other Services      Precautions / Restrictions Precautions Precautions: Fall Precaution Comments: JP drains Restrictions Weight Bearing Restrictions: No       Mobility Bed Mobility         Supine to sit: Mod assist;+2 for physical assistance     General bed mobility comments: HOB raised  Transfers                Lateral/Scoot Transfers: Mod assist;Max assist;+2 physical assistance General transfer comment: lateral scoot to recliner. Improved forward weight shift to assist with scooting    Balance     Sitting balance-Leahy Scale: Fair                                     ADL either performed or assessed with clinical judgement   ADL       Grooming: Brushing hair;Set up;Sitting                   Toilet Transfer: Moderate assistance;Maximal assistance;+2 for physical assistance Toilet Transfer Details (indicate cue type and reason): lateral scoot to recliner           General ADL Comments: Improved ROM/Strength of LUE today.  Used bil to comb hair.  Performed AAROM RUE after getting up to chair:  strength remains 2+/5     Vision       Perception     Praxis      Cognition Arousal/Alertness: Awake/alert Behavior During Therapy: WFL for tasks assessed/performed Overall Cognitive Status: Within Functional Limits for tasks assessed                                           Exercises Other Exercises Other Exercises: 1 set 10 AAROM RUE.  Now L has at least 3/5 strength   Shoulder Instructions       General Comments      Pertinent Vitals/ Pain       Pain Assessment: Faces Faces Pain Scale: Hurts even more Pain Location: feet, knees Pain Descriptors / Indicators: Aching;Sore Pain Intervention(s): Limited activity within patient's tolerance;Monitored during session;Premedicated before session;Repositioned  Home Living                                          Prior Functioning/Environment              Frequency  Min 2X/week        Progress Toward Goals  OT Goals(current goals can now be found in the care plan section)  Progress towards OT goals: Progressing toward goals     Plan      Co-evaluation    PT/OT/SLP Co-Evaluation/Treatment: Yes Reason for Co-Treatment: For  patient/therapist safety PT goals addressed during session: Mobility/safety with mobility OT goals addressed during session: ADL's and self-care      AM-PAC OT "6 Clicks" Daily Activity     Outcome Measure   Help from another person eating meals?: A Little Help from another person taking care of personal grooming?: A Little Help from another person toileting, which includes using toliet, bedpan, or urinal?: Total Help from another person bathing (including washing, rinsing, drying)?: A Lot Help from another person to put on and taking off regular upper body clothing?: A Lot Help from another person to put on and taking off regular lower body clothing?: Total 6 Click Score: 12    End of Session    OT Visit Diagnosis: Muscle weakness (generalized) (M62.81);Unsteadiness on feet (R26.81)   Activity Tolerance Patient tolerated treatment well;Patient limited by fatigue   Patient Left in chair;with call bell/phone within reach;with chair alarm set   Nurse Communication Mobility status;Need for lift equipment(maximove pad in chair)         Time: 5409-8119 OT Time Calculation (min): 33 min  Charges: OT General Charges $OT Visit: 1 Visit OT Treatments $Self Care/Home Management : 8-22 mins  Lesle Chris, OTR/L Acute Rehabilitation Services [phone] [place] pager [phone] office 08/15/2018   [place] 08/15/2018, 2:19 PM

## 2018-08-15 NOTE — Progress Notes (Signed)
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 Her condition rapidly deteriorated needing transfer to ICU on day of admission for persistent hypotension and lactic acidosis with no response with aggressive IV fluid resuscitation and consistent with septic shock as she ultimately required pressor support, intubation for acute proximal respiratory failure, acute renal failure.Patient underwent exploratory laparotomy on 6/10 to progressively worsening abdominal exam concerning for peritonitis and was found to have ischemic colitis requiring abdominal colectomy, ileostomy placement and cholecystectomy.   Hospital course complicated by prolonged weaning of pressor support, concern for aspiration given hypoxia and elevated requirement (6/14), and change in mentation though CT head at that time remained negative attributed to Dilaudid. Most recently found to have fluid collection in her abdomen. Underwent aspiration and drain placement by interventional radiology on 6/27.  New events last 24 hours / Subjective: Tolerating p.o. well.  No new complaints today.   Assessment & Plan:   Principal Problem:   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 Active Problems:   Hypothyroidism   History of Hirschsprung's disease   Hypotension   Elevated lactic acid level   CKD (chronic kidney disease) stage 3, GFR 30-59 ml/Hughes (HCC)   Diabetes (HCC)   Hyperlipidemia   Acute respiratory failure (HCC)   Sepsis (HCC)  Pressure injury of skin   Moderate protein malnutrition (HCC)   Hyperphosphatemia   Abdominal fluid collection   Ischemic colitis/acute cholecystitis-s/p total abdominal colectomy, [name], cholecystectomy 6/10:Wound VAC removed 6/29. Being followed by general surgery and wound. JP drain x2 place with pus drainage. Now on oral diet. TPN weaned off. Calorie count.  Repeat CT abdomen pelvis today per surgery  Left abdominal abscess:  Foul smelling vac output noted so CT abd was done, which showed loculated fluid collection concerning for abscess s/p aspiration and drain placement by IR on 6/27 and Zosyn started for 5 days on 6/27.Wound culture growing Enterococcus faecalis-sensitive to Unasyn/vancomycin.  Follow-up on culture. WBC is stable afebrile and clinically improving. On unasyn  Anemia of chronic disease S/p 1 unit PRBC 6/29 and improved. No acute blood loss noted. Iron panel consistent with anemia of chronic disease. Hgb stable.   Moderate protein calorie malnutrition in patient with morbid obesity. Secondary to patient's abdominal surgery/poor absorption.Prealbumin 11.5. Off TPN and tolerating diet, on calorie count appreciate dietitian input. Meets criteria for morbid obesity with BMI greater than 40.    Septic shock, resolved. Likely bacterial translocation from ischemic colitis, no longer requiring pressor support.  Currently on Unasyn for left abdominal abscess.  Acute hypoxic respiratory failure, resolved. Now on RA. she was Intubated on 6/10, extubated on 6/15. Suspected aspiration pneumonia (right lower lobe consolidation on imaging) as well as severe sepsis from colitis.  Encourage ambulation/IS   AKI on CKD, Stage 3 resolved.Peak creatinine of 3. AKI from ATN. Baseline 1.4-1.6 (per care everywhere). Avoid nephrotoxins   Type 2 diabetes. Stable. Continue sliding scale.  Hypothyroidism Continue Synthroid   Deconditioning, continue to work with PT/OT recs  SNF once medically stable.  Pressure injury of sacrum, not present on admission.Skin  intact, continue local care, continue offloading.  Bilateral ankle pain: Uric acid level normal.  Continue pain control.   DVT prophylaxis: SCD Code Status: Full Family Communication: Spoke with son over the phone for update Disposition Plan: SNF placement pending general surgery recommendation for discharge, repeat CT A/P pending today.  Repeat COVID testing ordered today for SNF discharge   Consultants:   PCCM  General surgery  IR  Procedures:   Exploratory laparotomy, total abdominal colectomy, ileostomy, cholecystectomy 6/10 - Dr. Derrell Lolling   Aspiration of residual fluid plus drain placement by IR 6/27  Antimicrobials:  Anti-infectives (From admission, onward)   Start     Dose/Rate Route Frequency Ordered Stop   08/12/18 1600  Ampicillin-Sulbactam (UNASYN) 3 g in sodium chloride 0.9 % 100 mL IVPB     3 g 200 mL/hr over 30 Minutes Intravenous Every 6 hours 08/12/18 1358     08/09/18 0900  piperacillin-tazobactam (ZOSYN) IVPB 3.375 g  Status:  Discontinued     3.375 g 12.5 mL/hr over 240 Minutes Intravenous Every 8 hours 08/09/18 0822 08/12/18 1358   08/06/18 1400  fluconazole (DIFLUCAN) tablet 150 mg     150 mg Oral Daily 08/06/18 1205 08/08/18 0941   07/29/18 0600  meropenem (MERREM) 1 g in sodium chloride 0.9 % 100 mL IVPB  Status:  Discontinued     1 g 200 mL/hr over 30 Minutes Intravenous Every 12 hours 07/29/18 0457 08/03/18 1048   07/29/18 0500  ceFEPIme (MAXIPIME) 2 g in sodium chloride 0.9 % 100 mL IVPB  Status:  Discontinued     2 g 200 mL/hr over 30 Minutes Intravenous Every 24 hours 07/29/18 0436 07/29/18 0457   07/26/18 1400  piperacillin-tazobactam (ZOSYN) IVPB 3.375 g  Status:  Discontinued     3.375 g 12.5 mL/hr over 240 Minutes Intravenous Every 6 hours 07/26/18 0906 07/26/18 1339   07/26/18 1400  cefTRIAXone (ROCEPHIN) 1 g in sodium chloride 0.9 % 100 mL IVPB   Status:  Discontinued     1 g 200 mL/hr over 30 Minutes Intravenous Every 24 hours 07/26/18 1339 07/29/18 0436   07/23/18 0200  piperacillin-tazobactam (ZOSYN) IVPB 2.25 g  Status:  Discontinued     2.25 g 100 mL/hr over 30 Minutes Intravenous Every 6 hours 07/22/18 2248 07/26/18 0906   07/22/18 1800  piperacillin-tazobactam (ZOSYN) IVPB 3.375 g  Status:  Discontinued     3.375 g 12.5 mL/hr over 240 Minutes Intravenous Every 8 hours 07/22/18 0953 07/22/18 2248   07/22/18 1000  piperacillin-tazobactam (ZOSYN) IVPB 3.375 g     3.375 g 100 mL/hr over 30 Minutes Intravenous  Once 07/22/18 0953 07/22/18 1239   07/22/18 0745  ciprofloxacin (CIPRO) IVPB 400 mg     400 mg 200 mL/hr over 60 Minutes Intravenous  Once 07/22/18 0739 07/22/18 0921   07/22/18 0745  metroNIDAZOLE (FLAGYL) IVPB 500 mg     500 mg 100 mL/hr over 60 Minutes Intravenous  Once 07/22/18 0739 07/22/18 1024       Objective: Vitals:   08/14/18 1116 08/14/18 1332 08/14/18 2040 08/15/18 0421  BP:  (!) 146/63 (!) 155/65 (!) 145/63  Pulse:  98 97 88  Resp:  18 16 16   Temp:  98.4 F (36.9 C) 99.2 F (37.3 C) 98.1 F (36.7 C)  TempSrc:      SpO2:  91% 95% 93%  Weight: 103 kg     Height:        Intake/Output Summary (Last 24 hours) at  08/15/2018 1007 Last data filed at 08/15/2018 0900 Gross per 24 hour  Intake 710.78 ml  Output 1500 ml  Net -789.22 ml   Filed Weights   07/31/18 1016 08/07/18 0551 08/14/18 1116  Weight: 107.2 kg 110.1 kg 103 kg    Examination: General exam: Appears calm and comfortable  Respiratory system: Clear to auscultation. Respiratory effort normal. Cardiovascular system: S1 & S2 heard, RRR. No JVD, murmurs, rubs, gallops or clicks. No pedal edema. Gastrointestinal system: Abdomen is nondistended, soft and nontender. No organomegaly or masses felt. Normal bowel sounds heard. + Abdominal drain with pus Central nervous system: Alert and oriented. No focal neurological deficits. Extremities:  Symmetric 5 x 5 power. Skin: No rashes, lesions or ulcers Psychiatry: Judgement and insight appear normal. Mood & affect appropriate.    Data Reviewed: I have personally reviewed following labs and imaging studies  CBC: Recent Labs  Lab 08/09/18 0455 08/10/18 0338 08/11/18 0422 08/11/18 1802 08/12/18 0607 08/14/18 0355 08/15/18 0520  WBC 11.4* 8.7 7.6  --  8.3 7.3 6.1  NEUTROABS 8.6* 6.4 4.8  --   --   --   --   HGB 7.7* 7.0* 6.6* 8.2* 8.2* 7.7* 7.6*  HCT 25.4* 23.8* 22.5* 26.6* 26.4* 26.2* 25.4*  MCV 93.7 93.0 93.4  --  91.3 94.6 93.4  PLT 404* 418* 369  --  341 289 264   Basic Metabolic Panel: Recent Labs  Lab 08/10/18 0338 08/11/18 0422 08/12/18 0607 08/14/18 0355 08/15/18 0520  NA 139 142 139 138 138  K 4.4 4.5 4.1 4.5 4.2  CL 112* 115* 111 112* 111  CO2 19* 21* 20* 20* 23  GLUCOSE 124* 110* 118* 145* 120*  BUN 40* 36* 26* 27* 23  CREATININE 1.10* 1.11* 1.08* 1.29* 1.01*  CALCIUM 8.2* 8.1* 8.1* 8.0* 8.0*   GFR: Estimated Creatinine Clearance: 50 mL/Hughes (A) (by C-G formula based on SCr of 1.01 mg/dL (H)). Liver Function Tests: Recent Labs  Lab 08/12/18 0607  AST 37  ALT 40  ALKPHOS 109  BILITOT 0.6  PROT 5.4*  ALBUMIN 1.7*   No results for input(s): LIPASE, AMYLASE in the last 168 hours. No results for input(s): AMMONIA in the last 168 hours. Coagulation Profile: No results for input(s): INR, PROTIME in the last 168 hours. Cardiac Enzymes: No results for input(s): CKTOTAL, CKMB, CKMBINDEX, TROPONINI in the last 168 hours. BNP (last 3 results) No results for input(s): PROBNP in the last 8760 hours. HbA1C: No results for input(s): HGBA1C in the last 72 hours. CBG: Recent Labs  Lab 08/14/18 0726 08/14/18 1123 08/14/18 1611 08/14/18 2042 08/15/18 0736  GLUCAP 127* 190* 140* 156* 105*   Lipid Profile: No results for input(s): CHOL, HDL, LDLCALC, TRIG, CHOLHDL, LDLDIRECT in the last 72 hours. Thyroid Function Tests: No results for input(s):  TSH, T4TOTAL, FREET4, T3FREE, THYROIDAB in the last 72 hours. Anemia Panel: No results for input(s): VITAMINB12, FOLATE, FERRITIN, TIBC, IRON, RETICCTPCT in the last 72 hours. Sepsis Labs: No results for input(s): PROCALCITON, LATICACIDVEN in the last 168 hours.  Recent Results (from the past 240 hour(s))  Aerobic/Anaerobic Culture (surgical/deep wound)     Status: None   Collection Time: 08/09/18  2:52 PM   Specimen: Abscess  Result Value Ref Range Status   Specimen Description   Final    ABSCESS Performed at [facility], [address] [place], [place] [address]    Special Requests   Final    NONE Performed at [facility], [address], [place], [place] [address]    Gram Stain   Final    ABUNDANT WBC PRESENT, PREDOMINANTLY PMN ABUNDANT GRAM POSITIVE COCCI IN PAIRS ABUNDANT GRAM NEGATIVE RODS Performed at [facility], [address], [place], [place] [address]    Culture   Final    ABUNDANT ENTEROCOCCUS FAECALIS FEW MIXED ANAEROBIC FLORA PRESENT.  CALL LAB IF FURTHER IID REQUIRED.    Report Status 08/12/2018 FINAL  Final   Organism ID, Bacteria ENTEROCOCCUS FAECALIS  Final      Susceptibility   Enterococcus faecalis - MIC*    AMPICILLIN <=2 SENSITIVE Sensitive     VANCOMYCIN 2 SENSITIVE Sensitive     GENTAMICIN SYNERGY SENSITIVE Sensitive     * ABUNDANT ENTEROCOCCUS FAECALIS       Radiology Studies: No results found.    Scheduled Meds: . acetaminophen  1,000 mg Oral Q6H  . feeding supplement (ENSURE ENLIVE)  237 mL Oral TID BM  . feeding supplement (PRO-STAT SUGAR FREE 64)  30 mL Oral TID  . insulin aspart  0-15 Units Subcutaneous TID AC & HS  . levothyroxine  112 mcg Oral Q0600  . mouth rinse  15 mL Mouth Rinse BID  . nystatin  5 mL Oral QID  . nystatin   Topical QODAY  . olopatadine  1 drop Both Eyes BID  . pantoprazole  40 mg Oral QHS  . sodium chloride (PF)      . sodium chloride flush  10-40 mL  Intracatheter Q12H  . sodium chloride flush  5 mL Intracatheter Q8H  . tobramycin-dexamethasone  1 application Both Eyes BID   Continuous Infusions: . sodium chloride 250 mL (08/11/18 0037)  . ampicillin-sulbactam (UNASYN) IV 3 g (08/15/18 0540)     LOS: 24 days    Time spent: 25 minutes   Noralee Stain, DO [facility] [email or web address] 08/15/2018, 10:07 AM

## 2018-08-15 NOTE — Progress Notes (Signed)
Patient ID: Hailey Hughes, female   DOB: Nov 23, 1940, 78 y.o.   MRN: [id number]   Acute Care Surgery Service Progress Note:    Chief Complaint/Subjective: Not having any c/o No drain output recorded yesterday Reports that she is eating some  Objective: Vital signs in last 24 hours: Temp:  [98.1 F (36.7 C)-99.2 F (37.3 C)] 98.1 F (36.7 C) (07/03 0421) Pulse Rate:  [88-98] 88 (07/03 0421) Resp:  [16-18] 16 (07/03 0421) BP: (145-155)/(63-65) 145/63 (07/03 0421) SpO2:  [91 %-95 %] 93 % (07/03 0421) Weight:  [103 kg] 103 kg (07/02 1116) Last BM Date: 08/15/18(illeostomy)  Intake/Output from previous day: 07/02 0701 - 07/03 0700 In: 1550.8 [P.O.:480; I.V.:5; IV Piggyback:1065.8] Out: 1250 [Urine:1000; Stool:250] Intake/Output this shift: Total I/O In: 0  Out: 250 [Stool:250]  Lungs: cta, nonlabored  Cardiovascular: reg  Abd: soft, obese, drains - purulent; ostomy with liquid in bag  Extremities: no edema, +SCDs  Neuro: alert, nonfocal  Lab Results: CBC  Recent Labs    08/14/18 0355 08/15/18 0520  WBC 7.3 6.1  HGB 7.7* 7.6*  HCT 26.2* 25.4*  PLT 289 264   BMET Recent Labs    08/14/18 0355 08/15/18 0520  NA 138 138  K 4.5 4.2  CL 112* 111  CO2 20* 23  GLUCOSE 145* 120*  BUN 27* 23  CREATININE 1.29* 1.01*  CALCIUM 8.0* 8.0*   LFT Hepatic Function Latest Ref Rng & Units 08/12/2018 08/07/2018 08/04/2018  Total Protein 6.5 - 8.1 g/dL 5.4(L) 5.2(L) 5.4(L)  Albumin 3.5 - 5.0 g/dL 1.7(L) 1.6(L) 1.7(L)  AST 15 - 41 U/L 37 37 35  ALT 0 - 44 U/L 40 33 33  Alk Phosphatase 38 - 126 U/L 109 111 99  Total Bilirubin 0.3 - 1.2 mg/dL 0.6 0.2(L) 0.5   PT/INR No results for input(s): LABPROT, INR in the last 72 hours. ABG No results for input(s): PHART, HCO3 in the last 72 hours.  Invalid input(s): PCO2, PO2  Studies/Results:  Anti-infectives: Anti-infectives (From admission, onward)   Start     Dose/Rate Route Frequency Ordered Stop   08/12/18 1600   Ampicillin-Sulbactam (UNASYN) 3 g in sodium chloride 0.9 % 100 mL IVPB     3 g 200 mL/hr over 30 Minutes Intravenous Every 6 hours 08/12/18 1358     08/09/18 0900  piperacillin-tazobactam (ZOSYN) IVPB 3.375 g  Status:  Discontinued     3.375 g 12.5 mL/hr over 240 Minutes Intravenous Every 8 hours 08/09/18 0822 08/12/18 1358   08/06/18 1400  fluconazole (DIFLUCAN) tablet 150 mg     150 mg Oral Daily 08/06/18 1205 08/08/18 0941   07/29/18 0600  meropenem (MERREM) 1 g in sodium chloride 0.9 % 100 mL IVPB  Status:  Discontinued     1 g 200 mL/hr over 30 Minutes Intravenous Every 12 hours 07/29/18 0457 08/03/18 1048   07/29/18 0500  ceFEPIme (MAXIPIME) 2 g in sodium chloride 0.9 % 100 mL IVPB  Status:  Discontinued     2 g 200 mL/hr over 30 Minutes Intravenous Every 24 hours 07/29/18 0436 07/29/18 0457   07/26/18 1400  piperacillin-tazobactam (ZOSYN) IVPB 3.375 g  Status:  Discontinued     3.375 g 12.5 mL/hr over 240 Minutes Intravenous Every 6 hours 07/26/18 0906 07/26/18 1339   07/26/18 1400  cefTRIAXone (ROCEPHIN) 1 g in sodium chloride 0.9 % 100 mL IVPB  Status:  Discontinued     1 g 200 mL/hr over 30 Minutes Intravenous Every 24  hours 07/26/18 1339 07/29/18 0436   07/23/18 0200  piperacillin-tazobactam (ZOSYN) IVPB 2.25 g  Status:  Discontinued     2.25 g 100 mL/hr over 30 Minutes Intravenous Every 6 hours 07/22/18 2248 07/26/18 0906   07/22/18 1800  piperacillin-tazobactam (ZOSYN) IVPB 3.375 g  Status:  Discontinued     3.375 g 12.5 mL/hr over 240 Minutes Intravenous Every 8 hours 07/22/18 0953 07/22/18 2248   07/22/18 1000  piperacillin-tazobactam (ZOSYN) IVPB 3.375 g     3.375 g 100 mL/hr over 30 Minutes Intravenous  Once 07/22/18 0953 07/22/18 1239   07/22/18 0745  ciprofloxacin (CIPRO) IVPB 400 mg     400 mg 200 mL/hr over 60 Minutes Intravenous  Once 07/22/18 0739 07/22/18 0921   07/22/18 0745  metroNIDAZOLE (FLAGYL) IVPB 500 mg     500 mg 100 mL/hr over 60 Minutes Intravenous   Once 07/22/18 0739 07/22/18 1024      Medications: Scheduled Meds: . acetaminophen  1,000 mg Oral Q6H  . feeding supplement (ENSURE ENLIVE)  237 mL Oral TID BM  . feeding supplement (PRO-STAT SUGAR FREE 64)  30 mL Oral TID  . insulin aspart  0-15 Units Subcutaneous TID AC & HS  . levothyroxine  112 mcg Oral Q0600  . mouth rinse  15 mL Mouth Rinse BID  . nystatin  5 mL Oral QID  . nystatin   Topical QODAY  . olopatadine  1 drop Both Eyes BID  . pantoprazole  40 mg Oral QHS  . sodium chloride (PF)      . sodium chloride flush  10-40 mL Intracatheter Q12H  . sodium chloride flush  5 mL Intracatheter Q8H  . tobramycin-dexamethasone  1 application Both Eyes BID   Continuous Infusions: . sodium chloride 250 mL (08/11/18 0037)  . ampicillin-sulbactam (UNASYN) IV 3 g (08/15/18 0540)   PRN Meds:.hydrALAZINE, HYDROmorphone (DILAUDID) injection, ondansetron (ZOFRAN) IV, oxyCODONE, sodium chloride flush  Assessment/Plan: Patient Active Problem List   Diagnosis Date Noted  . Abdominal fluid collection 08/08/2018  . Hyperphosphatemia 08/07/2018  . Moderate protein malnutrition ([place]) 08/06/2018  . Pressure injury of skin 08/05/2018  . Acute respiratory failure ([place])   . Sepsis ([place])   . 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 07/22/2018  . Hypotension   . Elevated lactic acid level   . CKD (chronic kidney disease) stage 3, GFR 30-59 ml/min (HCC) 12/30/2017  . Mild episode of recurrent major depressive disorder ([place]) 12/30/2017  . Anxiety 04/07/2015  . Diabetes ([place]) 04/07/2015  . Hyperlipidemia 04/07/2015  . DYSPEPSIA 05/26/2008  . Epigastric pain 05/26/2008  . Hypothyroidism 05/21/2008  . DIVERTICULOSIS, COLON 05/21/2008  . Constipation 05/21/2008  . Stenosis of rectum and anus 05/21/2008  . FIBROSITIS 05/21/2008  . 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  . 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 Diverticulosis of large  intestine without perforation or abscess without bleeding 05/21/2008   RLL pneumonia - Rx with Merrem Completed VDRF,resolved Type 2 diabetes Hypothyroid- on Synthroid Rheumatoid arthritis OSA Morbid obesity BMI 44 AKI - Cr trending down Malnutrition - prealbumin10.7yesterday-nutrition consult/calorie count Klebsiella UTI, resolved Anemia - being transfused 6/29 Thrombocytopenia -resolved  Septic shock Ischemic colitis, acute cholecystitis S/pExploratory laparotomy, total abdominal colectomy, [name], cholecystectomy-6/10-Dr. Dalbert Batman - POD#22 -CT scan 6/26:Loculated air-fluid collection anterior left hemidiaphragm adjacent small bowel measuring 15.7 x 7.7 x 4.8 cm -S/p IRdrain 08/09/18 - Midline wound VAC discontinued 08/11/2018-wet-to-dryTID - Having decent bowel function currently. -Onsoft diet,appreciate nutrition input, continue to work  on increasing PO intake - work on mobilizing more -concern for rectal stump leak, rectal drainage appears to be decreasing - repeat CT today    ID -Meropenem completed,Diflucan 6/24-6/26;PODiflucan6/23>6/26,zosyn 6/26>6/30; Unasyn 6/30>> FEN -IVF,soft diet VTE -SCDs, no chemical prophylaxis in setting of anemia Follow up -Dr. Adline Peals - son Shanon Brow [phone], husband Hoy Morn [phone]  Nothing really new today; check CT to evaluate resolution of abscess   LOS: 24 days    Leighton Ruff. Redmond Pulling, MD, FACS General, Bariatric, & Minimally Invasive Surgery [phone] [facility]

## 2018-08-15 NOTE — Progress Notes (Signed)
Physical Therapy Treatment Patient Details Name: Hailey Hughes MRN: [id number] DOB: 03-15-1940 Today's Date: 08/15/2018    History of Present Illness 78 year old white female admitted the hospital 6/9 with CT scan evidence of colitis transferred to the ICU  for septic shock, s/p colectomy and cholecystectomy 07/23/18    PT Comments    Pt progressing very slowly with mobility. Pt still requiring max assist +2 for scoot pivot transfer to chair, limited by generalized weakness and abdominal pain. Pt tolerates limited LE exercises, PT encouraging pt to perform ankle pumps, quad sets, and long arc quads when out of bed, pt agreeable. PT continuing to recommend SNF level of care post-acutely.    Follow Up Recommendations  SNF     Equipment Recommendations  Other (comment)(TBD)    Recommendations for Other Services       Precautions / Restrictions Precautions Precautions: Fall Precaution Comments: JP drains Restrictions Weight Bearing Restrictions: No    Mobility  Bed Mobility Overal bed mobility: Needs Assistance Bed Mobility: Supine to Sit   Sidelying to sit: HOB elevated;+2 for physical assistance;Mod assist Supine to sit: Mod assist;+2 for physical assistance     General bed mobility comments: Mod assist +2 for supine to sit for trunk elevation, LE management, and scooting to EOB with use of bed pad. Pt with use of bedrails and high HOB elevation to come to sitting.  Transfers Overall transfer level: Needs assistance   Transfers: Lateral/Scoot Transfers          Lateral/Scoot Transfers: +2 safety/equipment;+2 physical assistance;Mod assist;Max assist General transfer comment: max assist +2 for scooting to drop arm recliner from bed for translation of LEs and trunk. Verbal cuing for shifting weight anteriorly to offweight gluteal region to enhance scooting ability. Pt required trunk and LE assist, as well as positioning assist once in the recliner with use of bed  pads.  Ambulation/Gait Ambulation/Gait assistance: (NT)               Stairs             Wheelchair Mobility    Modified Rankin (Stroke Patients Only)       Balance Overall balance assessment: Needs assistance Sitting-balance support: Feet supported;No upper extremity supported Sitting balance-Leahy Scale: Fair Sitting balance - Comments: able to sit independently with UE support for up to 1 minute, requires assist to correct posterior leaning with fatigue Postural control: Right lateral lean;Posterior lean Standing balance support: (NT this session due to LE weakness)                                Cognition Arousal/Alertness: Awake/alert Behavior During Therapy: WFL for tasks assessed/performed Overall Cognitive Status: Within Functional Limits for tasks assessed                                        Exercises General Exercises - Lower Extremity Long Arc Quad: AROM;Both;10 reps;Pearlie.Gang) Other Exercises Other Exercises: 1 set 10 AAROM RUE.  Now L has at least 3/5 strength    General Comments        Pertinent Vitals/Pain Pain Assessment: Faces Faces Pain Scale: Hurts little more Pain Location: abdomen with mobility Pain Descriptors / Indicators: Sore;Discomfort Pain Intervention(s): Limited activity within patient's tolerance;Monitored during session;Repositioned    Home Living  Prior Function            PT Goals (current goals can now be found in the care plan section) Acute Rehab PT Goals Patient Stated Goal: to get stronger PT Goal Formulation: With patient Time For Goal Achievement: 08/22/18 Potential to Achieve Goals: Fair Progress towards PT goals: Progressing toward goals    Frequency    Min 2X/week      PT Plan Current plan remains appropriate    Co-evaluation PT/OT/SLP Co-Evaluation/Treatment: Yes Reason for Co-Treatment: For patient/therapist safety PT goals  addressed during session: Mobility/safety with mobility OT goals addressed during session: ADL's and self-care      AM-PAC PT "6 Clicks" Mobility   Outcome Measure    Help needed moving from lying on your back to sitting on the side of a flat bed without using bedrails?: A Lot Help needed moving to and from a bed to a chair (including a wheelchair)?: Total Help needed standing up from a chair using your arms (e.g., wheelchair or bedside chair)?: Total Help needed to walk in hospital room?: Total Help needed climbing 3-5 steps with a railing? : Total 6 Click Score: 6    End of Session   Activity Tolerance: Patient limited by fatigue;Patient limited by pain Patient left: with call bell/phone within reach;in chair Nurse Communication: Mobility status;Need for lift equipment(lift pad placed under pt in chair) PT Visit Diagnosis: Unsteadiness on feet (R26.81);Difficulty in walking, not elsewhere classified (R26.2);Muscle weakness (generalized) (M62.81);Pain     Time: 9373-4287 PT Time Calculation (min) (ACUTE ONLY): 23 min  Charges:  $Therapeutic Activity: 8-22 mins                     Mykle Pascua Terrial Rhodes, PT Acute Rehabilitation Services Pager [phone]  Office [phone]    Koren Sermersheim D Despina Hidden 08/15/2018, 3:54 PM

## 2018-08-16 LAB — BASIC METABOLIC PANEL
Anion gap: 6 (ref 5–15)
BUN: 22 mg/dL (ref 8–23)
CO2: 21 mmol/L — ABNORMAL LOW (ref 22–32)
Calcium: 8 mg/dL — ABNORMAL LOW (ref 8.9–10.3)
Chloride: 111 mmol/L (ref 98–111)
Creatinine, Ser: 0.97 mg/dL (ref 0.44–1.00)
GFR calc Af Amer: >60 mL/min (ref >60)
GFR calc non Af Amer: 55 mL/min — ABNORMAL LOW (ref >60)
Glucose, Bld: 127 mg/dL — ABNORMAL HIGH (ref 70–99)
Potassium: 3.9 mmol/L (ref 3.5–5.1)
Sodium: 138 mmol/L (ref 135–145)

## 2018-08-16 LAB — NOVEL CORONAVIRUS, NAA (HOSP ORDER, SEND-OUT TO REF LAB; TAT 18-24 HRS): SARS-CoV-2, NAA: NOT DETECTED

## 2018-08-16 LAB — CBC
HCT: 26.3 % — ABNORMAL LOW (ref 36.0–46.0)
Hemoglobin: 7.6 g/dL — ABNORMAL LOW (ref 12.0–15.0)
MCH: 27 pg (ref 26.0–34.0)
MCHC: 28.9 g/dL — ABNORMAL LOW (ref 30.0–36.0)
MCV: 93.6 fL (ref 80.0–100.0)
Platelets: 289 10*3/uL (ref 150–400)
RBC: 2.81 MIL/uL — ABNORMAL LOW (ref 3.87–5.11)
RDW: 14.2 % (ref 11.5–15.5)
WBC: 4.7 10*3/uL (ref 4.0–10.5)
nRBC: 0 % (ref 0.0–0.2)

## 2018-08-16 LAB — GLUCOSE, CAPILLARY
Glucose-Capillary: 110 mg/dL — ABNORMAL HIGH (ref 70–99)
Glucose-Capillary: 170 mg/dL — ABNORMAL HIGH (ref 70–99)
Glucose-Capillary: 114 mg/dL — ABNORMAL HIGH (ref 70–99)
Glucose-Capillary: 141 mg/dL — ABNORMAL HIGH (ref 70–99)

## 2018-08-16 MED ORDER — TRAZODONE HCL 50 MG PO TABS
50.0000 mg | ORAL_TABLET | Freq: Every evening | ORAL | Status: DC | PRN
  Administered 2018-08-16 – 2018-08-19 (×4): 50 mg via ORAL
  Filled 2018-08-16 (×4): qty 1

## 2018-08-16 NOTE — Progress Notes (Signed)
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 Her condition rapidly deteriorated needing transfer to ICU on day of admission for persistent hypotension and lactic acidosis with no response with aggressive IV fluid resuscitation and consistent with septic shock as she ultimately required pressor support, intubation for acute proximal respiratory failure, acute renal failure.Patient underwent exploratory laparotomy on 6/10 to progressively worsening abdominal exam concerning for peritonitis and was found to have ischemic colitis requiring abdominal colectomy, ileostomy placement and cholecystectomy.   Hospital course complicated by prolonged weaning of pressor support, concern for aspiration given hypoxia and elevated requirement (6/14), and change in mentation though CT head at that time remained negative attributed to Dilaudid. Most recently found to have fluid collection in her abdomen. Underwent aspiration and drain placement by interventional radiology on 6/27.  New events last 24 hours / Subjective: No new complaints or issues overnight  Assessment & Plan:   Principal Problem:   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 Active Problems:   Hypothyroidism   History of Hirschsprung's disease   Hypotension   Elevated lactic acid level   CKD (chronic kidney disease) stage 3, GFR 30-59 ml/min (HCC)   Diabetes (HCC)   Hyperlipidemia   Acute respiratory failure (HCC)   Sepsis (HCC)   Pressure  injury of skin   Moderate protein malnutrition (HCC)   Hyperphosphatemia   Abdominal fluid collection   Ischemic colitis/acute cholecystitis-s/p total abdominal colectomy, [name], cholecystectomy 6/10:Wound VAC removed 6/29. Being followed by general surgery and wound. JP drain x2 place with pus drainage. Now on oral diet. TPN weaned off. Calorie count.    Left abdominal abscess:  Foul smelling vac output noted so CT abd was done, which showed loculated fluid collection concerning for abscess s/p aspiration and drain placement by IR on 6/27 and Zosyn started for 5 days on 6/27.Wound culture growing Enterococcus faecalis-sensitive to Unasyn/vancomycin.  Follow-up on culture. WBC is stable afebrile and clinically improving. On unasyn. Repeat CT abdomen pelvis reveals later collection with air-fluid levels, indwelling drainage catheter, mildly decreased since 6/26  Anemia of chronic disease S/p 1 unit PRBC 6/29 and improved. No acute blood loss noted. Iron panel consistent with anemia of chronic disease. Hgb stable.   Moderate protein calorie malnutrition in patient with morbid obesity. Secondary to patient's abdominal surgery/poor absorption.Prealbumin 11.5. Off TPN and tolerating diet, on calorie count appreciate dietitian input. Meets criteria for morbid obesity with BMI greater than 40.    Septic shock, resolved. Likely bacterial translocation from ischemic colitis, no longer requiring pressor support.  Currently on Unasyn for left abdominal abscess.  Acute hypoxic respiratory failure, resolved. Now on RA. she was Intubated on 6/10, extubated on 6/15. Suspected aspiration pneumonia (right lower lobe consolidation on imaging) as well as severe sepsis from colitis.  Encourage ambulation/IS   AKI on CKD, Stage 3 resolved.Peak creatinine of 3. AKI from ATN. Baseline 1.4-1.6 (per care everywhere). Avoid nephrotoxins   Type 2 diabetes. Stable. Continue sliding scale.   Hypothyroidism Continue Synthroid   Deconditioning, continue to work with PT/OT recs SNF once medically  stable.  Pressure injury of sacrum, not present on admission.Skin intact, continue local care, continue offloading.  Bilateral ankle pain: Uric acid level normal.  Continue pain control.   DVT prophylaxis: SCD Code Status: Full Family Communication: Discussed with son over the phone  Disposition Plan: SNF placement pending general surgery recommendation for discharge. Repeat COVID testing pending for SNF discharge   Consultants:   PCCM  General surgery  IR  Procedures:   Exploratory laparotomy, total abdominal colectomy, ileostomy, cholecystectomy 6/10 - Dr. Derrell Lolling   Aspiration of residual fluid plus drain placement by IR 6/27  Antimicrobials:  Anti-infectives (From admission, onward)   Start     Dose/Rate Route Frequency Ordered Stop   08/12/18 1600  Ampicillin-Sulbactam (UNASYN) 3 g in sodium chloride 0.9 % 100 mL IVPB     3 g 200 mL/hr over 30 Minutes Intravenous Every 6 hours 08/12/18 1358     08/09/18 0900  piperacillin-tazobactam (ZOSYN) IVPB 3.375 g  Status:  Discontinued     3.375 g 12.5 mL/hr over 240 Minutes Intravenous Every 8 hours 08/09/18 0822 08/12/18 1358   08/06/18 1400  fluconazole (DIFLUCAN) tablet 150 mg     150 mg Oral Daily 08/06/18 1205 08/08/18 0941   07/29/18 0600  meropenem (MERREM) 1 g in sodium chloride 0.9 % 100 mL IVPB  Status:  Discontinued     1 g 200 mL/hr over 30 Minutes Intravenous Every 12 hours 07/29/18 0457 08/03/18 1048   07/29/18 0500  ceFEPIme (MAXIPIME) 2 g in sodium chloride 0.9 % 100 mL IVPB  Status:  Discontinued     2 g 200 mL/hr over 30 Minutes Intravenous Every 24 hours 07/29/18 0436 07/29/18 0457   07/26/18 1400  piperacillin-tazobactam (ZOSYN) IVPB 3.375 g  Status:  Discontinued     3.375 g 12.5 mL/hr over 240 Minutes Intravenous Every 6 hours 07/26/18 0906 07/26/18 1339   07/26/18 1400  cefTRIAXone (ROCEPHIN)  1 g in sodium chloride 0.9 % 100 mL IVPB  Status:  Discontinued     1 g 200 mL/hr over 30 Minutes Intravenous Every 24 hours 07/26/18 1339 07/29/18 0436   07/23/18 0200  piperacillin-tazobactam (ZOSYN) IVPB 2.25 g  Status:  Discontinued     2.25 g 100 mL/hr over 30 Minutes Intravenous Every 6 hours 07/22/18 2248 07/26/18 0906   07/22/18 1800  piperacillin-tazobactam (ZOSYN) IVPB 3.375 g  Status:  Discontinued     3.375 g 12.5 mL/hr over 240 Minutes Intravenous Every 8 hours 07/22/18 0953 07/22/18 2248   07/22/18 1000  piperacillin-tazobactam (ZOSYN) IVPB 3.375 g     3.375 g 100 mL/hr over 30 Minutes Intravenous  Once 07/22/18 0953 07/22/18 1239   07/22/18 0745  ciprofloxacin (CIPRO) IVPB 400 mg     400 mg 200 mL/hr over 60 Minutes Intravenous  Once 07/22/18 0739 07/22/18 0921   07/22/18 0745  metroNIDAZOLE (FLAGYL) IVPB 500 mg     500 mg 100 mL/hr over 60 Minutes Intravenous  Once 07/22/18 0739 07/22/18 1024       Objective: Vitals:   08/15/18 0421 08/15/18 1409 08/15/18 2051 08/16/18 0633  BP: (!) 145/63 (!) 154/69 (!) 151/77 (!) 169/74  Pulse: 88 97 94 78  Resp: 16 20 19 19   Temp: 98.1 F (36.7 C) 97.6 F (36.4 C) 97.6 F (36.4 C) 98 F (36.7 C)  TempSrc:   Oral Oral  SpO2: 93% 96% 100% 97%  Weight:      Height:        Intake/Output  Summary (Last 24 hours) at 08/16/2018 1016 Last data filed at 08/16/2018 0800 Gross per 24 hour  Intake 1114.2 ml  Output 1522 ml  Net -407.8 ml   Filed Weights   07/31/18 1016 08/07/18 0551 08/14/18 1116  Weight: 107.2 kg 110.1 kg 103 kg    Examination: General exam: Appears calm and comfortable  Respiratory system: Clear to auscultation. Respiratory effort normal. Cardiovascular system: S1 & S2 heard, RRR. No JVD, murmurs, rubs, gallops or clicks. No pedal edema. Gastrointestinal system: Abdomen is nondistended, soft and nontender. No organomegaly or masses felt. Normal bowel sounds heard. Central nervous system: Alert and oriented.  No focal neurological deficits. Extremities: Symmetric 5 x 5 power. Skin: No rashes, lesions or ulcers Psychiatry: Judgement and insight appear normal. Mood & affect appropriate.    Data Reviewed: I have personally reviewed following labs and imaging studies  CBC: Recent Labs  Lab 08/10/18 0338 08/11/18 0422 08/11/18 1802 08/12/18 0607 08/14/18 0355 08/15/18 0520 08/16/18 0321  WBC 8.7 7.6  --  8.3 7.3 6.1 4.7  NEUTROABS 6.4 4.8  --   --   --   --   --   HGB 7.0* 6.6* 8.2* 8.2* 7.7* 7.6* 7.6*  HCT 23.8* 22.5* 26.6* 26.4* 26.2* 25.4* 26.3*  MCV 93.0 93.4  --  91.3 94.6 93.4 93.6  PLT 418* 369  --  341 289 264 409   Basic Metabolic Panel: Recent Labs  Lab 08/11/18 0422 08/12/18 0607 08/14/18 0355 08/15/18 0520 08/16/18 0321  NA 142 139 138 138 138  K 4.5 4.1 4.5 4.2 3.9  CL 115* 111 112* 111 111  CO2 21* 20* 20* 23 21*  GLUCOSE 110* 118* 145* 120* 127*  BUN 36* 26* 27* 23 22  CREATININE 1.11* 1.08* 1.29* 1.01* 0.97  CALCIUM 8.1* 8.1* 8.0* 8.0* 8.0*   GFR: Estimated Creatinine Clearance: 52.1 mL/min (by C-G formula based on SCr of 0.97 mg/dL). Liver Function Tests: Recent Labs  Lab 08/12/18 0607  AST 37  ALT 40  ALKPHOS 109  BILITOT 0.6  PROT 5.4*  ALBUMIN 1.7*   No results for input(s): LIPASE, AMYLASE in the last 168 hours. No results for input(s): AMMONIA in the last 168 hours. Coagulation Profile: No results for input(s): INR, PROTIME in the last 168 hours. Cardiac Enzymes: No results for input(s): CKTOTAL, CKMB, CKMBINDEX, TROPONINI in the last 168 hours. BNP (last 3 results) No results for input(s): PROBNP in the last 8760 hours. HbA1C: No results for input(s): HGBA1C in the last 72 hours. CBG: Recent Labs  Lab 08/15/18 0736 08/15/18 1145 08/15/18 1654 08/15/18 2054 08/16/18 0714  GLUCAP 105* 123* 156* 110* 110*   Lipid Profile: No results for input(s): CHOL, HDL, LDLCALC, TRIG, CHOLHDL, LDLDIRECT in the last 72 hours. Thyroid Function  Tests: No results for input(s): TSH, T4TOTAL, FREET4, T3FREE, THYROIDAB in the last 72 hours. Anemia Panel: No results for input(s): VITAMINB12, FOLATE, FERRITIN, TIBC, IRON, RETICCTPCT in the last 72 hours. Sepsis Labs: No results for input(s): PROCALCITON, LATICACIDVEN in the last 168 hours.  Recent Results (from the past 240 hour(s))  Aerobic/Anaerobic Culture (surgical/deep wound)     Status: None   Collection Time: 08/09/18  2:52 PM   Specimen: Abscess  Result Value Ref Range Status   Specimen Description   Final    ABSCESS Performed at [facility] [address] [place], [place] [address]    Special Requests   Final    NONE Performed at [facility]  [facility], [address] [place], [place] [address]    Gram Stain   Final    ABUNDANT WBC PRESENT, PREDOMINANTLY PMN ABUNDANT GRAM POSITIVE COCCI IN PAIRS ABUNDANT GRAM NEGATIVE RODS Performed at [facility], [address] [place], [place] [address]    Culture   Final    ABUNDANT ENTEROCOCCUS FAECALIS FEW MIXED ANAEROBIC FLORA PRESENT.  CALL LAB IF FURTHER IID REQUIRED.    Report Status 08/12/2018 FINAL  Final   Organism ID, Bacteria ENTEROCOCCUS FAECALIS  Final      Susceptibility   Enterococcus faecalis - MIC*    AMPICILLIN <=2 SENSITIVE Sensitive     VANCOMYCIN 2 SENSITIVE Sensitive     GENTAMICIN SYNERGY SENSITIVE Sensitive     * ABUNDANT ENTEROCOCCUS FAECALIS       Radiology Studies: Ct Abdomen Pelvis W Contrast  Result Date: 08/15/2018 CLINICAL DATA:  Inpatient. Status post total colectomy with ileostomy 07/23/2018 with cholecystectomy. Left abdominal abscess status post percutaneous drainage. EXAM: CT ABDOMEN AND PELVIS WITH CONTRAST TECHNIQUE: Multidetector CT imaging of the abdomen and pelvis was performed using the standard protocol following bolus administration of intravenous contrast. CONTRAST:  100mL OMNIPAQUE IOHEXOL 300 MG/ML  SOLN COMPARISON:  08/08/2018 CT  abdomen/pelvis. FINDINGS: Lower chest: Tip of superior approach central venous catheter is seen at the cavoatrial junction. Coronary atherosclerosis. Small dependent bilateral pleural effusions, left greater than right, slightly increased bilaterally. Associated mild-to-moderate dependent bibasilar passive atelectasis. Right lower lobe 7 mm solid pulmonary nodule (series 4/image 17), stable since 07/22/2018 CT. Hepatobiliary: Normal liver size. No liver mass. Cholecystectomy. No biliary ductal dilatation. Pancreas: Normal, with no mass or duct dilation. Spleen: Normal size. No mass. Adrenals/Urinary Tract: Normal adrenals. No hydronephrosis. 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 No additional renal lesions. Normal bladder. Stomach/Bowel: Normal non-distended stomach. Status post total colectomy with ventral right abdominal and ileostomy. Normal caliber small bowel loops without small bowel wall thickening. Stable appearance of the relatively decompressed heart managed pouch. Stable horizontal curvilinear pocket of extraluminal gas in the posterior peritoneal cavity abutting the Hartmann's pouch suture line on the left (series 2/images 57-58). Vascular/Lymphatic: Atherosclerotic nonaneurysmal abdominal aorta. Patent portal, splenic, hepatic and renal veins. No pathologically enlarged lymph nodes in the abdomen or pelvis. Reproductive: Grossly normal uterus.  No adnexal mass. Other: Left peritoneal 6.0 x 4.2 x 18.5 cm loculated collection with air-fluid level with indwelling percutaneous pigtail drainage catheter (series 2/image 49 and series 5/image 39), mildly decreased from 7.7 x 4.8 x 20.8 cm on 08/08/2018 CT using similar measurement technique. Left lower quadrant percutaneous surgical drain terminates in the right lower peritoneum. Stable small crescent-shaped perisplenic collection measuring 6.2 x 1.8 cm (series 2/image 23) stable tiny collection under the left  hemidiaphragm adjacent to the gastric fundus measuring 3.1 x 1.9 cm (series 2/image 15). No new focal fluid collections. Partially healed midline laparotomy wound. Musculoskeletal: No aggressive appearing focal osseous lesions. Marked thoracolumbar spondylosis. IMPRESSION: 1. Left peritoneal loculated collection with air-fluid level with indwelling percutaneous pigtail drainage catheter, mildly decreased since 08/08/2018 CT abdomen study. 2. Additional small left upper quadrant peritoneal collections in the perisplenic and subdiaphragmatic locations, stable, as detailed. No new fluid collections. 3. Stable free air in a horizontal curvilinear configuration abutting Hartmann's pouch suture line on the left. 4. Small dependent bilateral pleural effusions, left greater than right, slightly increased bilaterally. 5. Right lower lobe 7 mm solid pulmonary nodule, stable since 07/22/2018 CT. Non-contrast chest CT at  6-12 months is recommended. If the nodule is stable at time of repeat CT, then future CT at 18-24 months (from today's scan) is considered optional for low-risk patients, but is recommended for high-risk patients. This recommendation follows the consensus statement: Guidelines for Management of Incidental Pulmonary Nodules Detected on CT Images:From the Fleischner Society 2017; published online before print (10.1148/radiol.[id number]). 6.  Aortic Atherosclerosis (ICD10-I70.0). Electronically Signed   By: Delbert PhenixJason A Poff [name]   On: 08/15/2018 12:46      Scheduled Meds: . acetaminophen  1,000 mg Oral Q6H  . feeding supplement (ENSURE ENLIVE)  237 mL Oral TID BM  . feeding supplement (PRO-STAT SUGAR FREE 64)  30 mL Oral TID  . insulin aspart  0-15 Units Subcutaneous TID AC & HS  . levothyroxine  112 mcg Oral Q0600  . mouth rinse  15 mL Mouth Rinse BID  . nystatin  5 mL Oral QID  . nystatin   Topical QODAY  . olopatadine  1 drop Both Eyes BID  . pantoprazole  40 mg Oral QHS  . sodium chloride flush   10-40 mL Intracatheter Q12H  . sodium chloride flush  5 mL Intracatheter Q8H  . tobramycin-dexamethasone  1 application Both Eyes BID   Continuous Infusions: . sodium chloride 250 mL (08/11/18 0037)  . ampicillin-sulbactam (UNASYN) IV 3 g (08/16/18 0531)     LOS: 25 days    Time spent: 25 minutes   Noralee StainJennifer Pryor Guettler, DO [facility] [email or web address] 08/16/2018, 10:16 AM

## 2018-08-16 NOTE — Progress Notes (Signed)
Dressing change on Mid abdomen surgical incision with wet to dry dressing, applied gauze, abdominal pad and tape; pt tolerated the procedure; refused for pain med for dressing change.

## 2018-08-16 NOTE — Progress Notes (Signed)
24 Days Post-Op   Subjective/Chief Complaint: Denies pain Reports appetite improving   Objective: Vital signs in last 24 hours: Temp:  [97.6 F (36.4 C)-98 F (36.7 C)] 98 F (36.7 C) (07/04 0737) Pulse Rate:  [78-97] 78 (07/04 0633) Resp:  [19-20] 19 (07/04 0633) BP: (151-169)/(69-77) 169/74 (07/04 1062) SpO2:  [96 %-100 %] 97 % (07/04 6948) Last BM Date: 08/15/18  Intake/Output from previous day: 07/03 0701 - 07/04 0700 In: 1354.2 [P.O.:1080; I.V.:30; IV Piggyback:134.2] Out: 1972 [Urine:1300; Drains:22; Stool:650] Intake/Output this shift: No intake/output data recorded.  Exam: Awake and alert, appears comfortable Abdomen soft, ostomy ok, purulence in drain  Lab Results:  Recent Labs    08/15/18 0520 08/16/18 0321  WBC 6.1 4.7  HGB 7.6* 7.6*  HCT 25.4* 26.3*  PLT 264 289   BMET Recent Labs    08/15/18 0520 08/16/18 0321  NA 138 138  K 4.2 3.9  CL 111 111  CO2 23 21*  GLUCOSE 120* 127*  BUN 23 22  CREATININE 1.01* 0.97  CALCIUM 8.0* 8.0*   PT/INR No results for input(s): LABPROT, INR in the last 72 hours. ABG No results for input(s): PHART, HCO3 in the last 72 hours.  Invalid input(s): PCO2, PO2  Studies/Results: Ct Abdomen Pelvis W Contrast  Result Date: 08/15/2018 CLINICAL DATA:  Inpatient. Status post total colectomy with ileostomy 07/23/2018 with cholecystectomy. Left abdominal abscess status post percutaneous drainage. EXAM: CT ABDOMEN AND PELVIS WITH CONTRAST TECHNIQUE: Multidetector CT imaging of the abdomen and pelvis was performed using the standard protocol following bolus administration of intravenous contrast. CONTRAST:  158mL OMNIPAQUE IOHEXOL 300 MG/ML  SOLN COMPARISON:  08/08/2018 CT abdomen/pelvis. FINDINGS: Lower chest: Tip of superior approach central venous catheter is seen at the cavoatrial junction. Coronary atherosclerosis. Small dependent bilateral pleural effusions, left greater than right, slightly increased bilaterally.  Associated mild-to-moderate dependent bibasilar passive atelectasis. Right lower lobe 7 mm solid pulmonary nodule (series 4/image 17), stable since 07/22/2018 CT. Hepatobiliary: Normal liver size. No liver mass. Cholecystectomy. No biliary ductal dilatation. Pancreas: Normal, with no mass or duct dilation. Spleen: Normal size. No mass. Adrenals/Urinary Tract: Normal adrenals. No hydronephrosis. 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 No additional renal lesions. Normal bladder. Stomach/Bowel: Normal non-distended stomach. Status post total colectomy with ventral right abdominal and ileostomy. Normal caliber small bowel loops without small bowel wall thickening. Stable appearance of the relatively decompressed heart managed pouch. Stable horizontal curvilinear pocket of extraluminal gas in the posterior peritoneal cavity abutting the Hartmann's pouch suture line on the left (series 2/images 57-58). Vascular/Lymphatic: Atherosclerotic nonaneurysmal abdominal aorta. Patent portal, splenic, hepatic and renal veins. No pathologically enlarged lymph nodes in the abdomen or pelvis. Reproductive: Grossly normal uterus.  No adnexal mass. Other: Left peritoneal 6.0 x 4.2 x 18.5 cm loculated collection with air-fluid level with indwelling percutaneous pigtail drainage catheter (series 2/image 49 and series 5/image 39), mildly decreased from 7.7 x 4.8 x 20.8 cm on 08/08/2018 CT using similar measurement technique. Left lower quadrant percutaneous surgical drain terminates in the right lower peritoneum. Stable small crescent-shaped perisplenic collection measuring 6.2 x 1.8 cm (series 2/image 23) stable tiny collection under the left hemidiaphragm adjacent to the gastric fundus measuring 3.1 x 1.9 cm (series 2/image 15). No new focal fluid collections. Partially healed midline laparotomy wound. Musculoskeletal: No aggressive appearing focal osseous lesions. Marked thoracolumbar spondylosis.  IMPRESSION: 1. Left peritoneal loculated collection with air-fluid level with indwelling percutaneous pigtail drainage catheter, mildly decreased  since 08/08/2018 CT abdomen study. 2. Additional small left upper quadrant peritoneal collections in the perisplenic and subdiaphragmatic locations, stable, as detailed. No new fluid collections. 3. Stable free air in a horizontal curvilinear configuration abutting Hartmann's pouch suture line on the left. 4. Small dependent bilateral pleural effusions, left greater than right, slightly increased bilaterally. 5. Right lower lobe 7 mm solid pulmonary nodule, stable since 07/22/2018 CT. Non-contrast chest CT at 6-12 months is recommended. If the nodule is stable at time of repeat CT, then future CT at 18-24 months (from today's scan) is considered optional for low-risk patients, but is recommended for high-risk patients. This recommendation follows the consensus statement: Guidelines for Management of Incidental Pulmonary Nodules Detected on CT Images:From the Fleischner Society 2017; published online before print (10.1148/radiol.[id number]). 6.  Aortic Atherosclerosis (ICD10-I70.0). Electronically Signed   By: Delbert Phenix [name]   On: 08/15/2018 12:46    Anti-infectives: Anti-infectives (From admission, onward)   Start     Dose/Rate Route Frequency Ordered Stop   08/12/18 1600  Ampicillin-Sulbactam (UNASYN) 3 g in sodium chloride 0.9 % 100 mL IVPB     3 g 200 mL/hr over 30 Minutes Intravenous Every 6 hours 08/12/18 1358     08/09/18 0900  piperacillin-tazobactam (ZOSYN) IVPB 3.375 g  Status:  Discontinued     3.375 g 12.5 mL/hr over 240 Minutes Intravenous Every 8 hours 08/09/18 0822 08/12/18 1358   08/06/18 1400  fluconazole (DIFLUCAN) tablet 150 mg     150 mg Oral Daily 08/06/18 1205 08/08/18 0941   07/29/18 0600  meropenem (MERREM) 1 g in sodium chloride 0.9 % 100 mL IVPB  Status:  Discontinued     1 g 200 mL/hr over 30 Minutes Intravenous Every 12 hours  07/29/18 0457 08/03/18 1048   07/29/18 0500  ceFEPIme (MAXIPIME) 2 g in sodium chloride 0.9 % 100 mL IVPB  Status:  Discontinued     2 g 200 mL/hr over 30 Minutes Intravenous Every 24 hours 07/29/18 0436 07/29/18 0457   07/26/18 1400  piperacillin-tazobactam (ZOSYN) IVPB 3.375 g  Status:  Discontinued     3.375 g 12.5 mL/hr over 240 Minutes Intravenous Every 6 hours 07/26/18 0906 07/26/18 1339   07/26/18 1400  cefTRIAXone (ROCEPHIN) 1 g in sodium chloride 0.9 % 100 mL IVPB  Status:  Discontinued     1 g 200 mL/hr over 30 Minutes Intravenous Every 24 hours 07/26/18 1339 07/29/18 0436   07/23/18 0200  piperacillin-tazobactam (ZOSYN) IVPB 2.25 g  Status:  Discontinued     2.25 g 100 mL/hr over 30 Minutes Intravenous Every 6 hours 07/22/18 2248 07/26/18 0906   07/22/18 1800  piperacillin-tazobactam (ZOSYN) IVPB 3.375 g  Status:  Discontinued     3.375 g 12.5 mL/hr over 240 Minutes Intravenous Every 8 hours 07/22/18 0953 07/22/18 2248   07/22/18 1000  piperacillin-tazobactam (ZOSYN) IVPB 3.375 g     3.375 g 100 mL/hr over 30 Minutes Intravenous  Once 07/22/18 0953 07/22/18 1239   07/22/18 0745  ciprofloxacin (CIPRO) IVPB 400 mg     400 mg 200 mL/hr over 60 Minutes Intravenous  Once 07/22/18 0739 07/22/18 0921   07/22/18 0745  metroNIDAZOLE (FLAGYL) IVPB 500 mg     500 mg 100 mL/hr over 60 Minutes Intravenous  Once 07/22/18 0739 07/22/18 1024      Assessment/Plan: s/p Procedure(s): TOTAL ABDOMINAL COLECTOMY, [name], CHOLECYSTECTOMY (N/A)  Septic shock Ischemic colitis, acute cholecystitis S/pExploratory laparotomy, total abdominal colectomy, [name], cholecystectomy-6/10-Dr. Derrell Lolling -  POD#22 -CT scan 6/26:Loculated air-fluid collection anterior left hemidiaphragm adjacent small bowel measuring 15.7 x 7.7 x 4.8 cm -S/p IRdrain 08/09/18 - Midline wound VAC discontinued 08/11/2018-wet-to-dryTID  Continue to encourage po Continue wet to dry dressing  changes CT yesterday shows continued slight improvement   LOS: 25 days    Abigail Miyamotoouglas Amere Bricco 08/16/2018

## 2018-08-17 LAB — CBC
HCT: 26.4 % — ABNORMAL LOW (ref 36.0–46.0)
Hemoglobin: 8 g/dL — ABNORMAL LOW (ref 12.0–15.0)
MCH: 27.9 pg (ref 26.0–34.0)
MCHC: 30.3 g/dL (ref 30.0–36.0)
MCV: 92 fL (ref 80.0–100.0)
Platelets: 288 K/uL (ref 150–400)
RBC: 2.87 MIL/uL — ABNORMAL LOW (ref 3.87–5.11)
RDW: 14.3 % (ref 11.5–15.5)
WBC: 6.1 K/uL (ref 4.0–10.5)
nRBC: 0 % (ref 0.0–0.2)

## 2018-08-17 LAB — BASIC METABOLIC PANEL WITH GFR
Anion gap: 5 (ref 5–15)
BUN: 22 mg/dL (ref 8–23)
CO2: 22 mmol/L (ref 22–32)
Calcium: 8.1 mg/dL — ABNORMAL LOW (ref 8.9–10.3)
Chloride: 112 mmol/L — ABNORMAL HIGH (ref 98–111)
Creatinine, Ser: 0.87 mg/dL (ref 0.44–1.00)
GFR calc Af Amer: >60 mL/min (ref >60)
GFR calc non Af Amer: >60 mL/min (ref >60)
Glucose, Bld: 102 mg/dL — ABNORMAL HIGH (ref 70–99)
Potassium: 4 mmol/L (ref 3.5–5.1)
Sodium: 139 mmol/L (ref 135–145)

## 2018-08-17 LAB — GLUCOSE, CAPILLARY
Glucose-Capillary: 87 mg/dL (ref 70–99)
Glucose-Capillary: 100 mg/dL — ABNORMAL HIGH (ref 70–99)
Glucose-Capillary: 122 mg/dL — ABNORMAL HIGH (ref 70–99)
Glucose-Capillary: 137 mg/dL — ABNORMAL HIGH (ref 70–99)

## 2018-08-17 MED ORDER — HYPROMELLOSE (GONIOSCOPIC) 2.5 % OP SOLN: 1.0000 [drp] | OPHTHALMIC | Status: DC | PRN

## 2018-08-17 MED ORDER — POLYVINYL ALCOHOL 1.4 % OP SOLN
1.0000 [drp] | OPHTHALMIC | Status: DC | PRN
  Administered 2018-08-17: 1 [drp] via OPHTHALMIC
  Filled 2018-08-17: qty 15

## 2018-08-17 MED ORDER — DIPHENHYDRAMINE HCL 25 MG PO CAPS
25.0000 mg | ORAL_CAPSULE | Freq: Once | ORAL | Status: AC
  Administered 2018-08-17: 25 mg via ORAL
  Filled 2018-08-17: qty 1

## 2018-08-17 NOTE — Progress Notes (Signed)
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 Her condition rapidly deteriorated needing transfer to ICU on day of admission for persistent hypotension and lactic acidosis with no response with aggressive IV fluid resuscitation and consistent with septic shock as she ultimately required pressor support, intubation for acute proximal respiratory failure, acute renal failure.Patient underwent exploratory laparotomy on 6/10 to progressively worsening abdominal exam concerning for peritonitis and was found to have ischemic colitis requiring abdominal colectomy, ileostomy placement and cholecystectomy.   Hospital course complicated by prolonged weaning of pressor support, concern for aspiration given hypoxia and elevated requirement (6/14), and change in mentation though CT head at that time remained negative attributed to Dilaudid. Most recently found to have fluid collection in her abdomen. Underwent aspiration and drain placement by interventional radiology on 6/27.  New events last 24 hours / Subjective: Eating breakfast in bed and tolerating well.  No complaints today.  Assessment & Plan:   Principal Problem:   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 Active Problems:   Hypothyroidism   History of Hirschsprung's disease   Hypotension   Elevated lactic acid level   CKD (chronic kidney disease) stage 3, GFR 30-59 ml/min (HCC)   Diabetes (HCC)   Hyperlipidemia   Acute respiratory failure (HCC)  Sepsis (HCC)   Pressure injury of skin   Moderate protein malnutrition (HCC)   Hyperphosphatemia   Abdominal fluid collection   Ischemic colitis/acute cholecystitis-s/p total abdominal colectomy, [name], cholecystectomy 6/10:Wound VAC removed 6/29. Being followed by general surgery and wound. JP drain x2 place with pus drainage. Now on oral diet. TPN weaned off. Calorie count.    Left abdominal abscess:  Foul smelling vac output noted so CT abd was done, which showed loculated fluid collection concerning for abscess s/p aspiration and drain placement by IR on 6/27 and Zosyn started for 5 days on 6/27.Wound culture growing Enterococcus faecalis-sensitive to Unasyn/vancomycin.  Follow-up on culture. WBC is stable afebrile and clinically improving. On unasyn. Repeat CT abdomen pelvis reveals later collection with air-fluid levels, indwelling drainage catheter, mildly decreased since 6/26  Anemia of chronic disease S/p 1 unit PRBC 6/29 and improved. No acute blood loss noted. Iron panel consistent with anemia of chronic disease. Hgb stable.   Moderate protein calorie malnutrition in patient with morbid obesity. Secondary to patient's abdominal surgery/poor absorption.Prealbumin 11.5. Off TPN and tolerating diet, on calorie count appreciate dietitian input. Meets criteria for morbid obesity with BMI greater than 40.    Septic shock, resolved. Likely bacterial translocation from ischemic colitis, no longer requiring pressor support.  Currently on Unasyn for left abdominal abscess.  Acute hypoxic respiratory failure, resolved. Now on RA. she was Intubated on 6/10, extubated on 6/15. Suspected aspiration pneumonia (right lower lobe consolidation on imaging) as well as severe sepsis from colitis.  Encourage ambulation/IS   AKI on CKD, Stage 3 resolved.Peak creatinine of 3. AKI from ATN. Baseline 1.4-1.6 (per care everywhere). Avoid nephrotoxins   Type 2 diabetes. Stable. Continue  sliding scale.  Hypothyroidism Continue Synthroid   Deconditioning, continue to work with PT/OT recs  SNF once medically stable.  Pressure injury of sacrum, not present on admission.Skin intact, continue local care, continue offloading.  Bilateral ankle pain: Uric acid level normal.  Continue pain control.   DVT prophylaxis: SCD Code Status: Full Family Communication: None Disposition Plan: SNF placement pending general surgery recommendation for discharge. Repeat COVID testing negative   Consultants:   PCCM  General surgery  IR  Procedures:   Exploratory laparotomy, total abdominal colectomy, ileostomy, cholecystectomy 6/10 - Dr. Derrell Lolling   Aspiration of residual fluid plus drain placement by IR 6/27  Antimicrobials:  Anti-infectives (From admission, onward)   Start     Dose/Rate Route Frequency Ordered Stop   08/12/18 1600  Ampicillin-Sulbactam (UNASYN) 3 g in sodium chloride 0.9 % 100 mL IVPB     3 g 200 mL/hr over 30 Minutes Intravenous Every 6 hours 08/12/18 1358     08/09/18 0900  piperacillin-tazobactam (ZOSYN) IVPB 3.375 g  Status:  Discontinued     3.375 g 12.5 mL/hr over 240 Minutes Intravenous Every 8 hours 08/09/18 0822 08/12/18 1358   08/06/18 1400  fluconazole (DIFLUCAN) tablet 150 mg     150 mg Oral Daily 08/06/18 1205 08/08/18 0941   07/29/18 0600  meropenem (MERREM) 1 g in sodium chloride 0.9 % 100 mL IVPB  Status:  Discontinued     1 g 200 mL/hr over 30 Minutes Intravenous Every 12 hours 07/29/18 0457 08/03/18 1048   07/29/18 0500  ceFEPIme (MAXIPIME) 2 g in sodium chloride 0.9 % 100 mL IVPB  Status:  Discontinued     2 g 200 mL/hr over 30 Minutes Intravenous Every 24 hours 07/29/18 0436 07/29/18 0457   07/26/18 1400  piperacillin-tazobactam (ZOSYN) IVPB 3.375 g  Status:  Discontinued     3.375 g 12.5 mL/hr over 240 Minutes Intravenous Every 6 hours 07/26/18 0906 07/26/18 1339   07/26/18 1400  cefTRIAXone (ROCEPHIN) 1 g in sodium chloride 0.9 %  100 mL IVPB  Status:  Discontinued     1 g 200 mL/hr over 30 Minutes Intravenous Every 24 hours 07/26/18 1339 07/29/18 0436   07/23/18 0200  piperacillin-tazobactam (ZOSYN) IVPB 2.25 g  Status:  Discontinued     2.25 g 100 mL/hr over 30 Minutes Intravenous Every 6 hours 07/22/18 2248 07/26/18 0906   07/22/18 1800  piperacillin-tazobactam (ZOSYN) IVPB 3.375 g  Status:  Discontinued     3.375 g 12.5 mL/hr over 240 Minutes Intravenous Every 8 hours 07/22/18 0953 07/22/18 2248   07/22/18 1000  piperacillin-tazobactam (ZOSYN) IVPB 3.375 g     3.375 g 100 mL/hr over 30 Minutes Intravenous  Once 07/22/18 0953 07/22/18 1239   07/22/18 0745  ciprofloxacin (CIPRO) IVPB 400 mg     400 mg 200 mL/hr over 60 Minutes Intravenous  Once 07/22/18 0739 07/22/18 0921   07/22/18 0745  metroNIDAZOLE (FLAGYL) IVPB 500 mg     500 mg 100 mL/hr over 60 Minutes Intravenous  Once 07/22/18 0739 07/22/18 1024       Objective: Vitals:   08/16/18 0633 08/16/18 1319 08/16/18 1946 08/17/18 0545  BP: (!) 169/74 (!) 174/75 (!) 164/78 (!) 152/89  Pulse: 78 91 97 89  Resp: 19 16 16 16   Temp: 98 F (36.7 C) 98.2 F (36.8 C) 99.2 F (37.3 C) 98.2 F (36.8 C)  TempSrc: Oral Oral Oral Oral  SpO2: 97% 99% 95% 96%  Weight:      Height:        Intake/Output Summary (Last 24 hours) at 08/17/2018  1012 Last data filed at 08/17/2018 0750 Gross per 24 hour  Intake 755 ml  Output 1630 ml  Net -875 ml   Filed Weights   07/31/18 1016 08/07/18 0551 08/14/18 1116  Weight: 107.2 kg 110.1 kg 103 kg    Examination: General exam: Appears calm and comfortable  Respiratory system: Clear to auscultation. Respiratory effort normal. Cardiovascular system: S1 & S2 heard, RRR. No JVD, murmurs, rubs, gallops or clicks. No pedal edema. Gastrointestinal system: Abdomen is nondistended, soft and nontender. No organomegaly or masses felt. Normal bowel sounds heard. Central nervous system: Alert and oriented. No focal neurological  deficits. Extremities: Symmetric 5 x 5 power. Skin: No rashes, lesions or ulcers Psychiatry: Judgement and insight appear normal. Mood & affect appropriate.    Data Reviewed: I have personally reviewed following labs and imaging studies  CBC: Recent Labs  Lab 08/11/18 0422  08/12/18 0607 08/14/18 0355 08/15/18 0520 08/16/18 0321 08/17/18 0257  WBC 7.6  --  8.3 7.3 6.1 4.7 6.1  NEUTROABS 4.8  --   --   --   --   --   --   HGB 6.6*   < > 8.2* 7.7* 7.6* 7.6* 8.0*  HCT 22.5*   < > 26.4* 26.2* 25.4* 26.3* 26.4*  MCV 93.4  --  91.3 94.6 93.4 93.6 92.0  PLT 369  --  341 289 264 289 288   < > = values in this interval not displayed.   Basic Metabolic Panel: Recent Labs  Lab 08/12/18 0607 08/14/18 0355 08/15/18 0520 08/16/18 0321 08/17/18 0257  NA 139 138 138 138 139  K 4.1 4.5 4.2 3.9 4.0  CL 111 112* 111 111 112*  CO2 20* 20* 23 21* 22  GLUCOSE 118* 145* 120* 127* 102*  BUN 26* 27* 23 22 22   CREATININE 1.08* 1.29* 1.01* 0.97 0.87  CALCIUM 8.1* 8.0* 8.0* 8.0* 8.1*   GFR: Estimated Creatinine Clearance: 58.1 mL/min (by C-G formula based on SCr of 0.87 mg/dL). Liver Function Tests: Recent Labs  Lab 08/12/18 0607  AST 37  ALT 40  ALKPHOS 109  BILITOT 0.6  PROT 5.4*  ALBUMIN 1.7*   No results for input(s): LIPASE, AMYLASE in the last 168 hours. No results for input(s): AMMONIA in the last 168 hours. Coagulation Profile: No results for input(s): INR, PROTIME in the last 168 hours. Cardiac Enzymes: No results for input(s): CKTOTAL, CKMB, CKMBINDEX, TROPONINI in the last 168 hours. BNP (last 3 results) No results for input(s): PROBNP in the last 8760 hours. HbA1C: No results for input(s): HGBA1C in the last 72 hours. CBG: Recent Labs  Lab 08/16/18 0714 08/16/18 1121 08/16/18 1623 08/16/18 2140 08/17/18 0723  GLUCAP 110* 170* 114* 141* 87   Lipid Profile: No results for input(s): CHOL, HDL, LDLCALC, TRIG, CHOLHDL, LDLDIRECT in the last 72 hours. Thyroid  Function Tests: No results for input(s): TSH, T4TOTAL, FREET4, T3FREE, THYROIDAB in the last 72 hours. Anemia Panel: No results for input(s): VITAMINB12, FOLATE, FERRITIN, TIBC, IRON, RETICCTPCT in the last 72 hours. Sepsis Labs: No results for input(s): PROCALCITON, LATICACIDVEN in the last 168 hours.  Recent Results (from the past 240 hour(s))  Aerobic/Anaerobic Culture (surgical/deep wound)     Status: None   Collection Time: 08/09/18  2:52 PM   Specimen: Abscess  Result Value Ref Range Status   Specimen Description   Final    ABSCESS Performed at [facility] [address], [place], [place] [address]    Special  Requests   Final    NONE Performed at [facility], [address], [place], [place] [address]    Gram Stain   Final    ABUNDANT WBC PRESENT, PREDOMINANTLY PMN ABUNDANT GRAM POSITIVE COCCI IN PAIRS ABUNDANT GRAM NEGATIVE RODS Performed at [facility], [address], [place], [place] [address]    Culture   Final    ABUNDANT ENTEROCOCCUS FAECALIS FEW MIXED ANAEROBIC FLORA PRESENT.  CALL LAB IF FURTHER IID REQUIRED.    Report Status 08/12/2018 FINAL  Final   Organism ID, Bacteria ENTEROCOCCUS FAECALIS  Final      Susceptibility   Enterococcus faecalis - MIC*    AMPICILLIN <=2 SENSITIVE Sensitive     VANCOMYCIN 2 SENSITIVE Sensitive     GENTAMICIN SYNERGY SENSITIVE Sensitive     * ABUNDANT ENTEROCOCCUS FAECALIS  Novel Coronavirus, NAA (hospital order; send-out to ref lab)     Status: None   Collection Time: 08/15/18  1:08 PM   Specimen: Nasopharyngeal Swab; Respiratory  Result Value Ref Range Status   SARS-CoV-2, NAA NOT DETECTED NOT DETECTED Final    Comment: (NOTE) This test was developed and its performance characteristics determined by [employer] [facility]. This test has not been FDA cleared or approved. This test has been authorized by FDA under an Emergency Use Authorization (EUA). This test is only authorized  for the duration of time the declaration that circumstances exist justifying the authorization of the emergency use of in vitro diagnostic tests for detection of SARS-CoV-2 virus and/or diagnosis of COVID-19 infection under section 564(b)(1) of the Act, 21 U.S.C. 969GSP-3(S)(4), unless the authorization is terminated or revoked sooner. When diagnostic testing is negative, the possibility of a false negative result should be considered in the context of a patient's recent exposures and the presence of clinical signs and symptoms consistent with COVID-19. An individual without symptoms of COVID-19 and who is not shedding SARS-CoV-2 virus would expect to have a negative (not detected) result in this assay. Performed  At: [facility] [address] [place], [place] [id number] Jolene Schimke MD AK:[id number]    Coronavirus Source NASOPHARYNGEAL  Final    Comment: Performed at [facility], [address] [place], [place] [address]       Radiology Studies: Ct Abdomen Pelvis W Contrast  Result Date: 08/15/2018 CLINICAL DATA:  Inpatient. Status post total colectomy with ileostomy 07/23/2018 with cholecystectomy. Left abdominal abscess status post percutaneous drainage. EXAM: CT ABDOMEN AND PELVIS WITH CONTRAST TECHNIQUE: Multidetector CT imaging of the abdomen and pelvis was performed using the standard protocol following bolus administration of intravenous contrast. CONTRAST:  OMNIPAQUE IOHEXOL 300 MG/ML  SOLN COMPARISON:  08/08/2018 CT abdomen/pelvis. FINDINGS: Lower chest: Tip of superior approach central venous catheter is seen at the cavoatrial junction. Coronary atherosclerosis. Small dependent bilateral pleural effusions, left greater than right, slightly increased bilaterally. Associated mild-to-moderate dependent bibasilar passive atelectasis. Right lower lobe 7 mm solid pulmonary nodule (series 4/image 17), stable since 07/22/2018 CT. Hepatobiliary:  Normal liver size. No liver mass. Cholecystectomy. No biliary ductal dilatation. Pancreas: Normal, with no mass or duct dilation. Spleen: Normal size. No mass. Adrenals/Urinary Tract: Normal adrenals. No hydronephrosis. 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 No additional renal lesions. Normal bladder. Stomach/Bowel: Normal non-distended stomach. Status post total colectomy with ventral right abdominal and ileostomy. Normal caliber small bowel loops without small bowel wall thickening. Stable appearance of the relatively decompressed heart managed pouch. Stable horizontal  curvilinear pocket of extraluminal gas in the posterior peritoneal cavity abutting the Hartmann's pouch suture line on the left (series 2/images 57-58). Vascular/Lymphatic: Atherosclerotic nonaneurysmal abdominal aorta. Patent portal, splenic, hepatic and renal veins. No pathologically enlarged lymph nodes in the abdomen or pelvis. Reproductive: Grossly normal uterus.  No adnexal mass. Other: Left peritoneal 6.0 x 4.2 x 18.5 cm loculated collection with air-fluid level with indwelling percutaneous pigtail drainage catheter (series 2/image 49 and series 5/image 39), mildly decreased from 7.7 x 4.8 x 20.8 cm on 08/08/2018 CT using similar measurement technique. Left lower quadrant percutaneous surgical drain terminates in the right lower peritoneum. Stable small crescent-shaped perisplenic collection measuring 6.2 x 1.8 cm (series 2/image 23) stable tiny collection under the left hemidiaphragm adjacent to the gastric fundus measuring 3.1 x 1.9 cm (series 2/image 15). No new focal fluid collections. Partially healed midline laparotomy wound. Musculoskeletal: No aggressive appearing focal osseous lesions. Marked thoracolumbar spondylosis. IMPRESSION: 1. Left peritoneal loculated collection with air-fluid level with indwelling percutaneous pigtail drainage catheter, mildly decreased since 08/08/2018 CT abdomen study.  2. Additional small left upper quadrant peritoneal collections in the perisplenic and subdiaphragmatic locations, stable, as detailed. No new fluid collections. 3. Stable free air in a horizontal curvilinear configuration abutting Hartmann's pouch suture line on the left. 4. Small dependent bilateral pleural effusions, left greater than right, slightly increased bilaterally. 5. Right lower lobe 7 mm solid pulmonary nodule, stable since 07/22/2018 CT. Non-contrast chest CT at 6-12 months is recommended. If the nodule is stable at time of repeat CT, then future CT at 18-24 months (from today's scan) is considered optional for low-risk patients, but is recommended for high-risk patients. This recommendation follows the consensus statement: Guidelines for Management of Incidental Pulmonary Nodules Detected on CT Images:From the Fleischner Society 2017; published online before print (10.1148/radiol.[id number]). 6.  Aortic Atherosclerosis (ICD10-I70.0). Electronically Signed   By: Delbert PhenixJason A Poff [name]   On: 08/15/2018 12:46      Scheduled Meds:  acetaminophen  1,000 mg Oral Q6H   feeding supplement (ENSURE ENLIVE)  237 mL Oral TID BM   feeding supplement (PRO-STAT SUGAR FREE 64)  30 mL Oral TID   insulin aspart  0-15 Units Subcutaneous TID AC & HS   levothyroxine  112 mcg Oral Q0600   mouth rinse  15 mL Mouth Rinse BID   nystatin  5 mL Oral QID   nystatin   Topical QODAY   olopatadine  1 drop Both Eyes BID   pantoprazole  40 mg Oral QHS   sodium chloride flush  10-40 mL Intracatheter Q12H   sodium chloride flush  5 mL Intracatheter Q8H   tobramycin-dexamethasone  1 application Both Eyes BID   Continuous Infusions:  sodium chloride 250 mL (08/11/18 0037)   ampicillin-sulbactam (UNASYN) IV 3 g (08/17/18 0506)     LOS: 26 days    Time spent: 20 minutes   Noralee StainJennifer Satoya Feeley, DO [facility] [email or web address] 08/17/2018, 10:12 AM

## 2018-08-18 LAB — GLUCOSE, CAPILLARY
Glucose-Capillary: 112 mg/dL — ABNORMAL HIGH (ref 70–99)
Glucose-Capillary: 170 mg/dL — ABNORMAL HIGH (ref 70–99)
Glucose-Capillary: 125 mg/dL — ABNORMAL HIGH (ref 70–99)
Glucose-Capillary: 142 mg/dL — ABNORMAL HIGH (ref 70–99)

## 2018-08-18 MED ORDER — DIPHENHYDRAMINE HCL 25 MG PO CAPS
25.0000 mg | ORAL_CAPSULE | Freq: Once | ORAL | Status: AC
  Administered 2018-08-18: 25 mg via ORAL
  Filled 2018-08-18: qty 1

## 2018-08-18 NOTE — Consult Note (Signed)
[facility] Nurse ostomy follow up Stoma type/location: RMQ ileostomy.  Patient had teaching session last week. Is minimally partcipative due to dexterity issues.  She is planning to discharge to rehab for strengthening and ongoing care.  Stomal assessment/size: 1 3/8" pale pink and moist.  Liquid brown effluent Peristomal assessment: intact.  Using convex pouch due to abdominal plane and body habitus with barrier ring Treatment options for stomal/peristomal skin: barrier ring and convexity Output liquid stool Ostomy pouching: 1pc.convex with barrier ring Education provided: Instructed that pouch changes will be 2-3 times week.  emptying several times daily. She is asking about activitylevel with ostomy.  Informed that she can do all the things she enjoyed prior to surgery with an ostomy.  Enrolled patient in [place] Start Discharge program: Yes [facility] team will follow.  Domenic Moras MSN, RN, FNP-BC CWON Wound, Ostomy, Continence Nurse Pager [phone]

## 2018-08-18 NOTE — TOC Progression Note (Signed)
Transition of Care [facility]) - Progression Note    Patient Details  Name: Hailey Hughes MRN: [id number] Date of Birth: 01-30-1941  Transition of [facility]) CM/SW Contact  Leeroy Cha, RN Phone Number: 08/18/2018, 3:32 PM  Clinical Narrative:    Patient has a bed at [facility] care   Expected Discharge Plan: [place] Barriers to Discharge: Continued Medical Work up  Expected Discharge Plan and Services Expected Discharge Plan: [place]   Discharge Planning Services: CM Consult   Living arrangements for the past 2 months: Single Family Home Expected Discharge Date: (unknown)               DME Arranged: N/A DME Agency: NA       HH Arranged: NA HH Agency: NA         Social Determinants of Health (SDOH) Interventions    Readmission Risk Interventions No flowsheet data found.

## 2018-08-18 NOTE — Progress Notes (Signed)
26 Days Post-Op    CC: Abdominal pain  Subjective: Patient in bed with a wick in place.  Says she is too weak to get out of bed.  Tolerating soft diet well.  Objective: Vital signs in last 24 hours: Temp:  [98 F (36.7 C)-98.5 F (36.9 C)] 98 F (36.7 C) (07/06 0531) Pulse Rate:  [87-95] 87 (07/06 0531) Resp:  [16-19] 19 (07/06 0531) BP: (157-178)/(66-82) 157/66 (07/06 0531) SpO2:  [91 %-95 %] 94 % (07/06 0531) Last BM Date: 08/16/18 120 PO Urine 1200 Drain 115 LUQ;  110 LLQ Ileostomy 675 Afebrile, VS No labs  CT 7/3 -  Left peritoneal loculated collection with air-fluid level with indwelling percutaneous pigtail drainage catheter, mildly decreased since 08/08/2018 CT abdomen study. 2. Additional small left upper quadrant peritoneal collections in the perisplenic and subdiaphragmatic locations, stable, as detailed. No new fluid collections. 3. Stable free air in a horizontal curvilinear configuration abutting Hartmann's pouch suture line on the left. 4. Small dependent bilateral pleural effusions, left greater than right, slightly increased bilaterally. 5. Right lower lobe 7 mm solid pulmonary nodule, stable since       Intake/Output from previous day: 07/05 0701 - 07/06 0700 In: 604 [P.O.:120; IV Piggyback:469] Out: 1990 [Urine:1200; Drains:115; Stool:675] Intake/Output this shift: No intake/output data recorded.  General appearance: alert, cooperative and no distress Resp: clear to auscultation bilaterally GI: soft, non-tender; bowel sounds normal; no masses,  no organomegaly and ileostomy working well, tolerating PO's.  drains both full of white purulent fluid.    Today 7/6  6/29  Lab Results:  Recent Labs    08/16/18 0321 08/17/18 0257  WBC 4.7 6.1  HGB 7.6* 8.0*  HCT 26.3* 26.4*  PLT 289 288    BMET Recent Labs    08/16/18 0321 08/17/18 0257  NA 138 139  K 3.9 4.0  CL 111 112*  CO2 21* 22  GLUCOSE 127* 102*  BUN 22 22  CREATININE  0.97 0.87  CALCIUM 8.0* 8.1*   PT/INR No results for input(s): LABPROT, INR in the last 72 hours.  Recent Labs  Lab 08/12/18 0607  AST 37  ALT 40  ALKPHOS 109  BILITOT 0.6  PROT 5.4*  ALBUMIN 1.7*     Lipase     Component Value Date/Time   LIPASE 12 07/25/2018 0500     Medications: . acetaminophen  1,000 mg Oral Q6H  . feeding supplement (ENSURE ENLIVE)  237 mL Oral TID BM  . feeding supplement (PRO-STAT SUGAR FREE 64)  30 mL Oral TID  . insulin aspart  0-15 Units Subcutaneous TID AC & HS  . levothyroxine  112 mcg Oral Q0600  . mouth rinse  15 mL Mouth Rinse BID  . nystatin  5 mL Oral QID  . nystatin   Topical QODAY  . olopatadine  1 drop Both Eyes BID  . pantoprazole  40 mg Oral QHS  . sodium chloride flush  10-40 mL Intracatheter Q12H  . sodium chloride flush  5 mL Intracatheter Q8H  . tobramycin-dexamethasone  1 application Both Eyes BID    Assessment/Plan RLL pneumonia - Rx with Merrem Completed VDRF,resolved Type 2 diabetes Hypothyroid- on Synthroid Rheumatoid arthritis OSA Morbid obesity BMI 44 AKI - Cr trending down Malnutrition - prealbumin10.7yesterday-nutrition consult/calorie count Klebsiella UTI, resolved Anemia - being transfused 6/29 Thrombocytopenia -resolved  Septic shock Ischemic colitis, acute cholecystitis S/pExploratory laparotomy, total abdominal colectomy, [name], cholecystectomy-6/10-Dr. Dalbert Batman - POD#26 -CT scan 6/26:Loculated air-fluid collection anterior left hemidiaphragm  adjacent small bowel measuring 15.7 x 7.7 x 4.8 cm -S/p IRdrain 08/09/18 - Midline wound VAC discontinued 08/11/2018-wet-to-dryTID - Having decent bowel function currently. -Onsoft diet,appreciate nutrition input, continue to work on increasing PO intake - work on mobilizing more -concern for rectal stump leak, rectal drainage appears to be decreasing - CT scan 7/3   ID -Meropenem completed,Diflucan  6/24-6/26;PODiflucan6/23>6/26,zosyn 6/26>6/30; Unasyn 6/30>> day 7 FEN -IVF,soft diet VTE -SCDs, no chemical prophylaxis in setting of anemia Follow up -Dr. Marnette Burgess - son Onalee Hua [phone], husband Henreitta Cea [phone]  Plan: Continue drains.  Wound slowly improving. She needs to be OOB and mobilized.  She will not improve on a wick in bed.  Nurse today has never had her and knows nothing about mobilizing her.    LOS: 27 days     Dwyane Dupree 08/18/2018 [phone]

## 2018-08-18 NOTE — Progress Notes (Signed)
Referring Physician(s): Connor,C  Supervising Physician: Daryll Brod  Patient Status:  [facility] - In-pt  Chief Complaint: Abdominal pain/abscess   Subjective: Patient without new complaints.  Denies worsening abdominal pain or vomiting    Allergies: Bee venom and Epinephrine  Medications: Prior to Admission medications   Medication Sig Start Date End Date Taking? Authorizing Provider  amLODipine-benazepril (LOTREL) 5-20 MG per capsule Take 1 capsule by mouth every evening.    Yes [provider]  aspirin 81 MG tablet Take 81 mg by mouth every evening.    Yes [provider]  atorvastatin (LIPITOR) 20 MG tablet Take 20 mg by mouth every evening.    Yes [provider]  bumetanide (BUMEX) 2 MG tablet Take 2 mg by mouth every evening.    Yes [provider]  celecoxib (CELEBREX) 200 MG capsule Take 200 mg by mouth every evening.    Yes [provider]  febuxostat (ULORIC) 40 MG tablet Take 40 mg by mouth every evening. 12/26/17  Yes [provider]  fexofenadine (ALLEGRA) 180 MG tablet Take 180 mg by mouth daily as needed for allergies.    Yes [provider]  fluticasone (FLONASE) 50 MCG/ACT nasal spray Place 2 sprays into both nostrils daily as needed for allergies.  12/30/17  Yes [provider]  HYDROcodone-acetaminophen (NORCO) 7.5-325 MG tablet Take 1 tablet by mouth 3 (three) times daily as needed. 02/18/18  Yes [provider]  levothyroxine (SYNTHROID, LEVOTHROID) 112 MCG tablet Take 112 mcg by mouth daily.   Yes [provider]  metFORMIN (GLUCOPHAGE-XR) 500 MG 24 hr tablet Take 1,000 mg by mouth every evening.  06/26/17  Yes [provider]  metoprolol succinate (TOPROL-XL) 50 MG 24 hr tablet Take 50 mg by mouth daily. 12/30/17  Yes [provider]  montelukast (SINGULAIR) 10 MG tablet Take 10 mg by mouth daily as needed (allergies).    Yes [provider]   olopatadine (PATANOL) 0.1 % ophthalmic solution Place 1 drop into both eyes daily as needed for allergies.  12/30/17  Yes [provider]  pantoprazole (PROTONIX) 40 MG tablet Take 1 tablet (40 mg total) by mouth daily. 02/09/14  Yes Lafayette Dragon, MD  senna (SENOKOT) 8.6 MG tablet Take 1 tablet by mouth daily as needed for constipation.   Yes [provider]  tiZANidine (ZANAFLEX) 4 MG capsule Take 4 mg by mouth 2 (two) times daily as needed for muscle spasms.    Yes [provider]  tobramycin-dexamethasone (TOBRADEX) ophthalmic ointment Place 1 application into both eyes 2 (two) times daily.   Yes [provider]  triamcinolone cream (KENALOG) 0.1 % Apply 1 application topically daily as needed (ezcema).  05/29/17  Yes [provider]     Vital Signs: BP (!) 168/71   Pulse 94   Temp 98.4 F (36.9 C) (Oral)   Resp (!) 21   Ht 5\' 2"  (1.575 m)   Wt 227 lb 1.2 oz (103 kg)   LMP  (LMP Unknown)   SpO2 95%   BMI 41.53 kg/m   Physical Exam left abd drain intact, insertion site okay, output 115 cc of beige-colored fluid.  Drain irrigated without difficulty  Imaging: Ct Abdomen Pelvis W Contrast  Result Date: 08/15/2018 CLINICAL DATA:  Inpatient. Status post total colectomy with ileostomy 07/23/2018 with cholecystectomy. Left abdominal abscess status post percutaneous drainage. EXAM: CT ABDOMEN AND PELVIS WITH CONTRAST TECHNIQUE: Multidetector CT imaging of the abdomen and pelvis  was performed using the standard protocol following bolus administration of intravenous contrast. CONTRAST:  OMNIPAQUE IOHEXOL 300 MG/ML  SOLN COMPARISON:  08/08/2018 CT abdomen/pelvis. FINDINGS: Lower chest: Tip of superior approach central venous catheter is seen at the cavoatrial junction. Coronary atherosclerosis. Small dependent bilateral pleural effusions, left greater than right, slightly increased bilaterally. Associated mild-to-moderate dependent bibasilar  passive atelectasis. Right lower lobe 7 mm solid pulmonary nodule (series 4/image 17), stable since 07/22/2018 CT. Hepatobiliary: Normal liver size. No liver mass. Cholecystectomy. No biliary ductal dilatation. Pancreas: Normal, with no mass or duct dilation. Spleen: Normal size. No mass. Adrenals/Urinary Tract: Normal adrenals. No hydronephrosis. 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 No additional renal lesions. Normal bladder. Stomach/Bowel: Normal non-distended stomach. Status post total colectomy with ventral right abdominal and ileostomy. Normal caliber small bowel loops without small bowel wall thickening. Stable appearance of the relatively decompressed heart managed pouch. Stable horizontal curvilinear pocket of extraluminal gas in the posterior peritoneal cavity abutting the Hartmann's pouch suture line on the left (series 2/images 57-58). Vascular/Lymphatic: Atherosclerotic nonaneurysmal abdominal aorta. Patent portal, splenic, hepatic and renal veins. No pathologically enlarged lymph nodes in the abdomen or pelvis. Reproductive: Grossly normal uterus.  No adnexal mass. Other: Left peritoneal 6.0 x 4.2 x 18.5 cm loculated collection with air-fluid level with indwelling percutaneous pigtail drainage catheter (series 2/image 49 and series 5/image 39), mildly decreased from 7.7 x 4.8 x 20.8 cm on 08/08/2018 CT using similar measurement technique. Left lower quadrant percutaneous surgical drain terminates in the right lower peritoneum. Stable small crescent-shaped perisplenic collection measuring 6.2 x 1.8 cm (series 2/image 23) stable tiny collection under the left hemidiaphragm adjacent to the gastric fundus measuring 3.1 x 1.9 cm (series 2/image 15). No new focal fluid collections. Partially healed midline laparotomy wound. Musculoskeletal: No aggressive appearing focal osseous lesions. Marked thoracolumbar spondylosis. IMPRESSION: 1. Left peritoneal loculated  collection with air-fluid level with indwelling percutaneous pigtail drainage catheter, mildly decreased since 08/08/2018 CT abdomen study. 2. Additional small left upper quadrant peritoneal collections in the perisplenic and subdiaphragmatic locations, stable, as detailed. No new fluid collections. 3. Stable free air in a horizontal curvilinear configuration abutting Hartmann's pouch suture line on the left. 4. Small dependent bilateral pleural effusions, left greater than right, slightly increased bilaterally. 5. Right lower lobe 7 mm solid pulmonary nodule, stable since 07/22/2018 CT. Non-contrast chest CT at 6-12 months is recommended. If the nodule is stable at time of repeat CT, then future CT at 18-24 months (from today's scan) is considered optional for low-risk patients, but is recommended for high-risk patients. This recommendation follows the consensus statement: Guidelines for Management of Incidental Pulmonary Nodules Detected on CT Images:From the Fleischner Society 2017; published online before print (10.1148/radiol.4235361443). 6.  Aortic Atherosclerosis (ICD10-I70.0). Electronically Signed   By: Delbert Phenix [name]   On: 08/15/2018 12:46    Labs:  CBC: Recent Labs    08/14/18 0355 08/15/18 0520 08/16/18 0321 08/17/18 0257  WBC 7.3 6.1 4.7 6.1  HGB 7.7* 7.6* 7.6* 8.0*  HCT 26.2* 25.4* 26.3* 26.4*  PLT 289 264 289 288    COAGS: No results for input(s): INR, APTT in the last 8760 hours.  BMP: Recent Labs    08/14/18 0355 08/15/18 0520 08/16/18 0321 08/17/18 0257  NA 138 138 138 139  K 4.5 4.2 3.9 4.0  CL 112* 111 111 112*  CO2 20* 23 21* 22  GLUCOSE 145* 120* 127* 102*  BUN 27* 23  22 22  CALCIUM 8.0* 8.0* 8.0* 8.1*  CREATININE 1.29* 1.01* 0.97 0.87  GFRNONAA 39* 53* 55* >60  GFRAA 45* >60 >60 >60    LIVER FUNCTION TESTS: Recent Labs    07/31/18 0500 08/04/18 0430 08/07/18 0408 08/12/18 0607  BILITOT 0.2* 0.5 0.2* 0.6  AST 23 35 37 37  ALT 13 33 33 40   ALKPHOS 51 99 111 109  PROT 4.1* 5.4* 5.2* 5.4*  ALBUMIN 1.3* 1.7* 1.6* 1.7*    Assessment and Plan: Pt with hx septic shock, ischemic colitis, acute cholecystitis S/pExploratory laparotomy, total abdominal colectomy, [name], cholecystectomy-6/10-Dr. Derrell Lolling; s/p left abd abscess drain placement 6/27; afebrile; last CT from 7/3 with mildly decreased size of left peritoneal loculated abscess with small left upper quadrant peritoneal collections in the perisplenic and subdiaphragmatic locations which are stable.  No new fluid collections.  Plans as outlined by [facility].  Out of bed with PT/OT, incentive spirometry.  Will need drain injection before removal and repeat CT in 10-14 days.   Electronically Signed: D. Jeananne Rama, PA-C 08/18/2018, 4:11 PM   I spent a total of 15 minutes at the the patient's bedside AND on the patient's hospital floor or unit, greater than 50% of which was counseling/coordinating care for abdominal abscess drain    Patient ID: Hailey Hughes, female   DOB: 1940/05/12, 78 y.o.   MRN: [id number]

## 2018-08-18 NOTE — Progress Notes (Signed)
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 Her condition rapidly deteriorated needing transfer to ICU on day of admission for persistent hypotension and lactic acidosis with no response with aggressive IV fluid resuscitation and consistent with septic shock as she ultimately required pressor support, intubation for acute proximal respiratory failure, acute renal failure.Patient underwent exploratory laparotomy on 6/10 to progressively worsening abdominal exam concerning for peritonitis and was found to have ischemic colitis requiring abdominal colectomy, ileostomy placement and cholecystectomy.   Hospital course complicated by prolonged weaning of pressor support, concern for aspiration given hypoxia and elevated requirement (6/14), and change in mentation though CT head at that time remained negative attributed to Dilaudid. Most recently found to have fluid collection in her abdomen. Underwent aspiration and drain placement by interventional radiology on 6/27.  New events last 24 hours / Subjective: Tolerating oral intake, no complaints of any pain or shortness of breath.  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 Being followed by general surgery and wound. JP drain x2 place with pus drainage. Now on oral diet. TPN weaned off. Calorie count.    Left abdominal abscess:  Foul smelling vac output noted so CT abd was done, which showed loculated fluid collection concerning for abscess s/p aspiration and drain placement by IR on 6/27 and Zosyn started for 5 days on 6/27.Wound culture growing Enterococcus faecalis-sensitive to Unasyn/vancomycin.  Follow-up on culture. WBC is stable afebrile and clinically improving. On unasyn. Repeat CT abdomen pelvis reveals later collection with air-fluid levels, indwelling drainage catheter, mildly decreased since 6/26  Anemia of chronic disease S/p 1 unit PRBC 6/29 and improved. No acute blood loss noted. Iron panel consistent with anemia of chronic disease. Hgb stable.   Moderate protein calorie malnutrition in patient with morbid obesity. Secondary to patient's abdominal surgery/poor absorption.Prealbumin 11.5. Off TPN and tolerating diet, on calorie count appreciate dietitian input. Meets criteria for morbid obesity with BMI greater than 40.    Septic shock, resolved. Likely bacterial translocation from ischemic colitis, no longer requiring pressor support.  Currently on Unasyn for left abdominal abscess.  Acute hypoxic respiratory failure, resolved. Now on RA. she was Intubated on 6/10, extubated on 6/15. Suspected aspiration pneumonia (right lower lobe consolidation on imaging) as well as severe sepsis from colitis.  Encourage ambulation/IS   AKI on CKD, Stage 3 resolved.Peak creatinine of 3. AKI from ATN. Baseline 1.4-1.6 (per care everywhere). Avoid nephrotoxins   Type 2 diabetes. Stable.  Continue sliding scale.  Hypothyroidism Continue Synthroid   Deconditioning, continue to work  with PT/OT recs SNF once medically stable.  Pressure injury of sacrum, not present on admission.Skin intact, continue local care, continue offloading.  Bilateral ankle pain: Uric acid level normal.  Continue pain control.   DVT prophylaxis: SCD Code Status: Full Family Communication: None Disposition Plan: DC to SNF pending general surgery recommendation for discharge. Repeat COVID testing negative   Consultants:   PCCM  General surgery  IR  Procedures:   Exploratory laparotomy, total abdominal colectomy, ileostomy, cholecystectomy 6/10 - Dr. Derrell LollingIngram   Aspiration of residual fluid plus drain placement by IR 6/27  Antimicrobials:  Anti-infectives (From admission, onward)   Start     Dose/Rate Route Frequency Ordered Stop   08/12/18 1600  Ampicillin-Sulbactam (UNASYN) 3 g in sodium chloride 0.9 % 100 mL IVPB     3 g 200 mL/hr over 30 Minutes Intravenous Every 6 hours 08/12/18 1358     08/09/18 0900  piperacillin-tazobactam (ZOSYN) IVPB 3.375 g  Status:  Discontinued     3.375 g 12.5 mL/hr over 240 Minutes Intravenous Every 8 hours 08/09/18 0822 08/12/18 1358   08/06/18 1400  fluconazole (DIFLUCAN) tablet 150 mg     150 mg Oral Daily 08/06/18 1205 08/08/18 0941   07/29/18 0600  meropenem (MERREM) 1 g in sodium chloride 0.9 % 100 mL IVPB  Status:  Discontinued     1 g 200 mL/hr over 30 Minutes Intravenous Every 12 hours 07/29/18 0457 08/03/18 1048   07/29/18 0500  ceFEPIme (MAXIPIME) 2 g in sodium chloride 0.9 % 100 mL IVPB  Status:  Discontinued     2 g 200 mL/hr over 30 Minutes Intravenous Every 24 hours 07/29/18 0436 07/29/18 0457   07/26/18 1400  piperacillin-tazobactam (ZOSYN) IVPB 3.375 g  Status:  Discontinued     3.375 g 12.5 mL/hr over 240 Minutes Intravenous Every 6 hours 07/26/18 0906 07/26/18 1339   07/26/18 1400  cefTRIAXone (ROCEPHIN) 1 g in sodium chloride  0.9 % 100 mL IVPB  Status:  Discontinued     1 g 200 mL/hr over 30 Minutes Intravenous Every 24 hours 07/26/18 1339 07/29/18 0436   07/23/18 0200  piperacillin-tazobactam (ZOSYN) IVPB 2.25 g  Status:  Discontinued     2.25 g 100 mL/hr over 30 Minutes Intravenous Every 6 hours 07/22/18 2248 07/26/18 0906   07/22/18 1800  piperacillin-tazobactam (ZOSYN) IVPB 3.375 g  Status:  Discontinued     3.375 g 12.5 mL/hr over 240 Minutes Intravenous Every 8 hours 07/22/18 0953 07/22/18 2248   07/22/18 1000  piperacillin-tazobactam (ZOSYN) IVPB 3.375 g     3.375 g 100 mL/hr over 30 Minutes Intravenous  Once 07/22/18 0953 07/22/18 1239   07/22/18 0745  ciprofloxacin (CIPRO) IVPB 400 mg     400 mg 200 mL/hr over 60 Minutes Intravenous  Once 07/22/18 0739 07/22/18 0921   07/22/18 0745  metroNIDAZOLE (FLAGYL) IVPB 500 mg     500 mg 100 mL/hr over 60 Minutes Intravenous  Once 07/22/18 0739 07/22/18 1024       Objective: Vitals:   08/17/18 0545 08/17/18 1314 08/17/18 2101 08/18/18 0531  BP: (!) 152/89 (!) 178/82 (!) 158/82 (!) 157/66  Pulse: 89 95 87 87  Resp: 16 16 16 19   Temp: 98.2 F (36.8 C) 98.5 F (36.9 C) 98.3 F (36.8 C) 98 F (36.7 C)  TempSrc: Oral Oral Oral Oral  SpO2: 96% 95% 91% 94%  Weight:      Height:        Intake/Output Summary (Last  24 hours) at 08/18/2018 1002 Last data filed at 08/18/2018 0700 Gross per 24 hour  Intake 604 ml  Output 1835 ml  Net -1231 ml   Filed Weights   07/31/18 1016 08/07/18 0551 08/14/18 1116  Weight: 107.2 kg 110.1 kg 103 kg    Examination: General exam: Appears calm and comfortable  Respiratory system: Clear to auscultation. Respiratory effort normal. Cardiovascular system: S1 & S2 heard, RRR. No JVD, murmurs, rubs, gallops or clicks. No pedal edema. Gastrointestinal system: Abdomen is nondistended, soft.  2 drains in place with turbid output  Central nervous system: Alert and oriented. No focal neurological deficits. Extremities:  Symmetric 5 x 5 power. Skin: No rashes, lesions or ulcers Psychiatry: Judgement and insight appear normal. Mood & affect appropriate.   Data Reviewed: I have personally reviewed following labs and imaging studies  CBC: Recent Labs  Lab 08/12/18 0607 08/14/18 0355 08/15/18 0520 08/16/18 0321 08/17/18 0257  WBC 8.3 7.3 6.1 4.7 6.1  HGB 8.2* 7.7* 7.6* 7.6* 8.0*  HCT 26.4* 26.2* 25.4* 26.3* 26.4*  MCV 91.3 94.6 93.4 93.6 92.0  PLT 341 289 264 289 397   Basic Metabolic Panel: Recent Labs  Lab 08/12/18 0607 08/14/18 0355 08/15/18 0520 08/16/18 0321 08/17/18 0257  NA 139 138 138 138 139  K 4.1 4.5 4.2 3.9 4.0  CL 111 112* 111 111 112*  CO2 20* 20* 23 21* 22  GLUCOSE 118* 145* 120* 127* 102*  BUN 26* 27* 23 22 22   CREATININE 1.08* 1.29* 1.01* 0.97 0.87  CALCIUM 8.1* 8.0* 8.0* 8.0* 8.1*   GFR: Estimated Creatinine Clearance: 58.1 mL/min (by C-G formula based on SCr of 0.87 mg/dL). Liver Function Tests: Recent Labs  Lab 08/12/18 0607  AST 37  ALT 40  ALKPHOS 109  BILITOT 0.6  PROT 5.4*  ALBUMIN 1.7*   No results for input(s): LIPASE, AMYLASE in the last 168 hours. No results for input(s): AMMONIA in the last 168 hours. Coagulation Profile: No results for input(s): INR, PROTIME in the last 168 hours. Cardiac Enzymes: No results for input(s): CKTOTAL, CKMB, CKMBINDEX, TROPONINI in the last 168 hours. BNP (last 3 results) No results for input(s): PROBNP in the last 8760 hours. HbA1C: No results for input(s): HGBA1C in the last 72 hours. CBG: Recent Labs  Lab 08/17/18 0723 08/17/18 1136 08/17/18 1545 08/17/18 2104 08/18/18 0740  GLUCAP 87 100* 122* 137* 112*   Lipid Profile: No results for input(s): CHOL, HDL, LDLCALC, TRIG, CHOLHDL, LDLDIRECT in the last 72 hours. Thyroid Function Tests: No results for input(s): TSH, T4TOTAL, FREET4, T3FREE, THYROIDAB in the last 72 hours. Anemia Panel: No results for input(s): VITAMINB12, FOLATE, FERRITIN, TIBC, IRON,  RETICCTPCT in the last 72 hours. Sepsis Labs: No results for input(s): PROCALCITON, LATICACIDVEN in the last 168 hours.  Recent Results (from the past 240 hour(s))  Aerobic/Anaerobic Culture (surgical/deep wound)     Status: None   Collection Time: 08/09/18  2:52 PM   Specimen: Abscess  Result Value Ref Range Status   Specimen Description   Final    ABSCESS Performed at [facility] [address] [place], [place] [address]    Special Requests   Final    NONE Performed at [facility] [place] [address] [place], [place] [address]    Gram Stain   Final    ABUNDANT WBC PRESENT, PREDOMINANTLY PMN ABUNDANT GRAM POSITIVE COCCI IN PAIRS ABUNDANT GRAM NEGATIVE RODS Performed at [facility], [place]  [address] [place], [place] [address]    Culture   Final    ABUNDANT ENTEROCOCCUS FAECALIS FEW MIXED ANAEROBIC FLORA PRESENT.  CALL LAB IF FURTHER IID REQUIRED.    Report Status 08/12/2018 FINAL  Final   Organism ID, Bacteria ENTEROCOCCUS FAECALIS  Final      Susceptibility   Enterococcus faecalis - MIC*    AMPICILLIN <=2 SENSITIVE Sensitive     VANCOMYCIN 2 SENSITIVE Sensitive     GENTAMICIN SYNERGY SENSITIVE Sensitive     * ABUNDANT ENTEROCOCCUS FAECALIS  Novel Coronavirus, NAA (hospital order; send-out to ref lab)     Status: None   Collection Time: 08/15/18  1:08 PM   Specimen: Nasopharyngeal Swab; Respiratory  Result Value Ref Range Status   SARS-CoV-2, NAA NOT DETECTED NOT DETECTED Final    Comment: (NOTE) This test was developed and its performance characteristics determined by [facility]. This test has not been FDA cleared or approved. This test has been authorized by FDA under an Emergency Use Authorization (EUA). This test is only authorized for the duration of time the declaration that circumstances exist justifying the authorization of the emergency use of in vitro diagnostic tests for detection of SARS-CoV-2 virus  and/or diagnosis of COVID-19 infection under section 564(b)(1) of the Act, 21 U.S.C. 308MVH-8(I)(6360bbb-3(b)(1), unless the authorization is terminated or revoked sooner. When diagnostic testing is negative, the possibility of a false negative result should be considered in the context of a patient's recent exposures and the presence of clinical signs and symptoms consistent with COVID-19. An individual without symptoms of COVID-19 and who is not shedding SARS-CoV-2 virus would expect to have a negative (not detected) result in this assay. Performed  At: [facility] [place] [address] [place], [place] [id number] Jolene SchimkeNagendra Sanjai MD LK:[phone]    Coronavirus Source NASOPHARYNGEAL  Final    Comment: Performed at [facility], [address] [place], [place] [address]       Radiology Studies: No results found.    Scheduled Meds: . acetaminophen  1,000 mg Oral Q6H  . feeding supplement (ENSURE ENLIVE)  237 mL Oral TID BM  . feeding supplement (PRO-STAT SUGAR FREE 64)  30 mL Oral TID  . insulin aspart  0-15 Units Subcutaneous TID AC & HS  . levothyroxine  112 mcg Oral Q0600  . mouth rinse  15 mL Mouth Rinse BID  . nystatin  5 mL Oral QID  . nystatin   Topical QODAY  . olopatadine  1 drop Both Eyes BID  . pantoprazole  40 mg Oral QHS  . sodium chloride flush  10-40 mL Intracatheter Q12H  . sodium chloride flush  5 mL Intracatheter Q8H  . tobramycin-dexamethasone  1 application Both Eyes BID   Continuous Infusions: . sodium chloride 250 mL (08/18/18 0628)  . ampicillin-sulbactam (UNASYN) IV 3 g (08/18/18 0639)     LOS: 27 days    Time spent: 20 minutes   Noralee StainJennifer Damontay Alred, DO [facility] [email or web address] 08/18/2018, 10:02 AM

## 2018-08-19 LAB — GLUCOSE, CAPILLARY
Glucose-Capillary: 101 mg/dL — ABNORMAL HIGH (ref 70–99)
Glucose-Capillary: 126 mg/dL — ABNORMAL HIGH (ref 70–99)
Glucose-Capillary: 129 mg/dL — ABNORMAL HIGH (ref 70–99)
Glucose-Capillary: 129 mg/dL — ABNORMAL HIGH (ref 70–99)

## 2018-08-19 LAB — SARS CORONAVIRUS 2 BY RT PCR (HOSPITAL ORDER, PERFORMED IN ~~LOC~~ HOSPITAL LAB): SARS Coronavirus 2: NEGATIVE

## 2018-08-19 MED ORDER — DIPHENHYDRAMINE HCL 25 MG PO CAPS
25.0000 mg | ORAL_CAPSULE | Freq: Once | ORAL | Status: AC
  Administered 2018-08-19: 25 mg via ORAL
  Filled 2018-08-19: qty 1

## 2018-08-19 NOTE — Progress Notes (Signed)
Physical Therapy Treatment Patient Details Name: Hailey Hughes MRN: [id number] DOB: Jun 14, 1940 Today's Date: 08/19/2018    History of Present Illness 78 year old white female admitted the hospital 6/9 with CT scan evidence of colitis transferred to the ICU  for septic shock, s/p colectomy and cholecystectomy 07/23/18.  Hospital course complicated by prolonged weaning of pressor support, concern for aspiration given hypoxia and elevated requirement (6/14), and change in mentation though CT head at that time remained negative attributed to Dilaudid.  Most recently found to have fluid collection in her abdomen. Underwent aspiration and drain placement by interventional radiology on 6/27.    PT Comments    Pt assisted with sitting upright.  Pt then agreeable to use stedy to initiate standing tolerance.  Pt assisted with sit to stands 3-4 times and then used stedy to transfer pt to recliner.  Pt continues to require at least max assist +2 for safety, so continue to recommend SNF.     Follow Up Recommendations  SNF     Equipment Recommendations  Other (comment)(per next venue)    Recommendations for Other Services       Precautions / Restrictions Precautions Precautions: Fall Precaution Comments: JP drains    Mobility  Bed Mobility Overal bed mobility: Needs Assistance Bed Mobility: Supine to Sit     Supine to sit: Mod assist;+2 for physical assistance     General bed mobility comments: pt requires frequent prompts to attempt self assist first, assist required for trunk upright due to back pain and scooting to EOB utilizing bed pad  Transfers Overall transfer level: Needs assistance   Transfers: Sit to/from Stand Sit to Stand: Max assist;+2 physical assistance         General transfer comment: performed x3 for technique and strengthening, assist to rise and hold upright; cues for technique, pt with purulent drainage from rectum with initial standing (RN notified); utilized  stedy to assist pt to recliner (pt and RN aware of lift for back to bed for safety)  Ambulation/Gait                 Stairs             Wheelchair Mobility    Modified Rankin (Stroke Patients Only)       Balance Overall balance assessment: Needs assistance Sitting-balance support: No upper extremity supported Sitting balance-Leahy Scale: Fair                                      Cognition Arousal/Alertness: Awake/alert Behavior During Therapy: WFL for tasks assessed/performed Overall Cognitive Status: Within Functional Limits for tasks assessed                                        Exercises      General Comments        Pertinent Vitals/Pain Pain Assessment: Faces Faces Pain Scale: Hurts little more Pain Location: back Pain Descriptors / Indicators: Sore;Discomfort Pain Intervention(s): Monitored during session;Repositioned    Home Living                      Prior Function            PT Goals (current goals can now be found in the care plan section) Acute Rehab PT Goals PT Goal  Formulation: With patient Time For Goal Achievement: 09/02/18 Potential to Achieve Goals: Fair Progress towards PT goals: Progressing toward goals    Frequency    Min 2X/week      PT Plan Current plan remains appropriate    Co-evaluation              AM-PAC PT "6 Clicks" Mobility   Outcome Measure  Help needed turning from your back to your side while in a flat bed without using bedrails?: A Lot Help needed moving from lying on your back to sitting on the side of a flat bed without using bedrails?: A Lot Help needed moving to and from a bed to a chair (including a wheelchair)?: Total Help needed standing up from a chair using your arms (e.g., wheelchair or bedside chair)?: Total Help needed to walk in hospital room?: Total Help needed climbing 3-5 steps with a railing? : Total 6 Click Score: 8    End of  Session Equipment Utilized During Treatment: Gait belt Activity Tolerance: Patient tolerated treatment well Patient left: in chair;with chair alarm set;with call bell/phone within reach Nurse Communication: Mobility status;Need for lift equipment PT Visit Diagnosis: Difficulty in walking, not elsewhere classified (R26.2);Muscle weakness (generalized) (M62.81)     Time: 7124-5809 PT Time Calculation (min) (ACUTE ONLY): 31 min  Charges:  $Therapeutic Activity: 23-37 mins           Zenovia Jarred, PT, DPT Acute Rehabilitation Services Office: [phone] Pager: [phone]    Sarajane Jews 08/19/2018, 12:52 PM

## 2018-08-19 NOTE — Progress Notes (Signed)
27 Days Post-Op    CC: Abdominal pain  Subjective:  Tolerating soft diet well. Denies any n/v. Has been tolerating diet  Objective: Vital signs in last 24 hours: Temp:  [97.9 F (36.6 C)-98.5 F (36.9 C)] 97.9 F (36.6 C) (07/07 0555) Pulse Rate:  [87-99] 87 (07/07 0555) Resp:  [20] 20 (07/07 0555) BP: (157-175)/(73-77) 157/73 (07/07 0555) SpO2:  [97 %-98 %] 97 % (07/07 0555) Last BM Date: 08/18/18 120 PO Urine 1200 Drain 115 LUQ;  110 LLQ Ileostomy 675 Afebrile, VS No labs  CT 7/3 -  Left peritoneal loculated collection with air-fluid level with indwelling percutaneous pigtail drainage catheter, mildly decreased since 08/08/2018 CT abdomen study. 2. Additional small left upper quadrant peritoneal collections in the perisplenic and subdiaphragmatic locations, stable, as detailed. No new fluid collections. 3. Stable free air in a horizontal curvilinear configuration abutting Hartmann's pouch suture line on the left. 4. Small dependent bilateral pleural effusions, left greater than right, slightly increased bilaterally. 5. Right lower lobe 7 mm solid pulmonary nodule, stable since  Intake/Output from previous day: 07/06 0701 - 07/07 0700 In: 1036.9 [P.O.:760; I.V.:20; IV Piggyback:246.9] Out: 1475 [Urine:925; Drains:200; Stool:350] Intake/Output this shift: Total I/O In: 1657 [P.O.:1657] Out: 375 [Stool:375]  General appearance: alert, cooperative and no distress Resp: clear to auscultation bilaterally GI: soft, non-tender; bowel sounds normal; no masses,  no organomegaly and ileostomy working well, drains both with Solomia Harrell turbid fluid, stable in appearance.    Lab Results:  Recent Labs    08/17/18 0257  WBC 6.1  HGB 8.0*  HCT 26.4*  PLT 288    BMET Recent Labs    08/17/18 0257  NA 139  K 4.0  CL 112*  CO2 22  GLUCOSE 102*  BUN 22  CREATININE 0.87  CALCIUM 8.1*   PT/INR No results for input(s): LABPROT, INR in the last 72 hours.  No results  for input(s): AST, ALT, ALKPHOS, BILITOT, PROT, ALBUMIN in the last 168 hours.   Lipase     Component Value Date/Time   LIPASE 12 07/25/2018 0500     Medications: . acetaminophen  1,000 mg Oral Q6H  . feeding supplement (ENSURE ENLIVE)  237 mL Oral TID BM  . feeding supplement (PRO-STAT SUGAR FREE 64)  30 mL Oral TID  . insulin aspart  0-15 Units Subcutaneous TID AC & HS  . levothyroxine  112 mcg Oral Q0600  . mouth rinse  15 mL Mouth Rinse BID  . nystatin  5 mL Oral QID  . nystatin   Topical QODAY  . olopatadine  1 drop Both Eyes BID  . pantoprazole  40 mg Oral QHS  . sodium chloride flush  10-40 mL Intracatheter Q12H  . sodium chloride flush  5 mL Intracatheter Q8H  . tobramycin-dexamethasone  1 application Both Eyes BID    Assessment/Plan RLL pneumonia - Rx with Merrem Completed VDRF,resolved Type 2 diabetes Hypothyroid- on Synthroid Rheumatoid arthritis OSA Morbid obesity BMI 44 AKI - Cr trending down Malnutrition - prealbumin10.7yesterday-nutrition consult/calorie count Klebsiella UTI, resolved Anemia - being transfused 6/29 Thrombocytopenia -resolved  Septic shock Ischemic colitis, acute cholecystitis S/pExploratory laparotomy, total abdominal colectomy, [name], cholecystectomy-6/10-Dr. Dalbert Batman - POD#27 -CT scan 6/26:Loculated air-fluid collection anterior left hemidiaphragm adjacent small bowel measuring 15.7 x 7.7 x 4.8 cm -S/p IRdrain 08/09/18 - Midline wound VAC discontinued 08/11/2018-wet-to-dryTID - Having decent bowel function currently. -Onsoft diet,appreciate nutrition input, continue to work on increasing PO intake - work on mobilizing more -concern for rectal stump  leak, rectal drainage appears to be decreasing - CT scan 7/3   ID -Meropenem completed,Diflucan 6/24-6/26;PODiflucan6/23>6/26,zosyn 6/26>6/30; Unasyn 6/30>> day 7 FEN -IVF,soft diet VTE -SCDs, no chemical prophylaxis in setting of  anemia Follow up -Dr. Marnette Burgess - son Onalee Hua [phone], husband Henreitta Cea [phone]  Plan: Continue drains.  Wound slowly improving. She needs to be OOB and mobilized.  Anticipate discharge to SNF this week - will need follow-up imaging in 1-2 wks to monitor intra-abdominal fluid collections and drains   LOS: 28 days     Stephanie Coup [facility] 08/19/2018 [phone]

## 2018-08-19 NOTE — Progress Notes (Signed)
Occupational Therapy Treatment Patient Details Name: Hailey Hughes MRN: [id number] DOB: October 13, 1940 Today's Date: 08/19/2018    History of present illness 78 year old white female admitted the hospital 6/9 with CT scan evidence of colitis transferred to the ICU  for septic shock, s/p colectomy and cholecystectomy 07/23/18.  Hospital course complicated by prolonged weaning of pressor support, concern for aspiration given hypoxia and elevated requirement (6/14), and change in mentation though CT head at that time remained negative attributed to Dilaudid.  Most recently found to have fluid collection in her abdomen. Underwent aspiration and drain placement by interventional radiology on 6/27.   OT comments  Pt just back to bed:  Introduced AE and practiced rolling.  Goals updated  Follow Up Recommendations  SNF    Equipment Recommendations  None recommended by OT    Recommendations for Other Services      Precautions / Restrictions Precautions Precautions: Fall Precaution Comments: JP drains Restrictions Weight Bearing Restrictions: No       Mobility Bed Mobility Overal bed mobility: Needs Assistance Bed Mobility: Supine to Sit Rolling: Mod assist(to R only)   Supine to sit: Mod assist;+2 for physical assistance     General bed mobility comments: pt requires frequent prompts to attempt self assist first, assist required for trunk upright due to back pain and scooting to EOB utilizing bed pad  Transfers Overall transfer level: Needs assistance   Transfers: Sit to/from Stand Sit to Stand: Max assist;+2 physical assistance         General transfer comment: performed x3 for technique and strengthening, assist to rise and hold upright; cues for technique, pt with purulent drainage from rectum with initial standing (RN notified); utilized stedy to assist pt to recliner (pt and RN aware of lift for back to bed for safety)    Balance Overall balance assessment: Needs  assistance Sitting-balance support: No upper extremity supported Sitting balance-Leahy Scale: Fair                                     ADL either performed or assessed with clinical judgement   ADL                                         General ADL Comments: pt just back to bed.  Introduced AE from bed level; will try to use in chair next time. Pt reports she is washing UB and doing grooming.       Vision       Perception     Praxis      Cognition Arousal/Alertness: Awake/alert Behavior During Therapy: WFL for tasks assessed/performed Overall Cognitive Status: Within Functional Limits for tasks assessed                                          Exercises     Shoulder Instructions       General Comments      Pertinent Vitals/ Pain       Pain Assessment: Faces Faces Pain Scale: Hurts a little bit Pain Location: back Pain Descriptors / Indicators: Sore Pain Intervention(s): Limited activity within patient's tolerance;Monitored during session;Repositioned  Home Living  Prior Functioning/Environment              Frequency  Min 2X/week        Progress Toward Goals  OT Goals(current goals can now be found in the care plan section)  Progress towards OT goals: Progressing toward goals(changed goals)     Plan      Co-evaluation                 AM-PAC OT "6 Clicks" Daily Activity     Outcome Measure   Help from another person eating meals?: A Lot Help from another person taking care of personal grooming?: A Lot Help from another person toileting, which includes using toliet, bedpan, or urinal?: Total Help from another person bathing (including washing, rinsing, drying)?: A Lot Help from another person to put on and taking off regular upper body clothing?: A Little Help from another person to put on and taking off regular lower body  clothing?: Total 6 Click Score: 11    End of Session    OT Visit Diagnosis: Muscle weakness (generalized) (M62.81);Unsteadiness on feet (R26.81)   Activity Tolerance Patient tolerated treatment well;Patient limited by fatigue   Patient Left in bed;with call bell/phone within reach;with bed alarm set   Nurse Communication          Time: 7793-9030 OT Time Calculation (min): 18 min  Charges: OT General Charges $OT Visit: 1 Visit OT Treatments $Self Care/Home Management : 8-22 mins  Marica Otter, OTR/L Acute Rehabilitation Services [phone] [facility] pager [phone] office 08/19/2018   Daniyah Fohl 08/19/2018, 2:31 PM

## 2018-08-19 NOTE — Discharge Instructions (Signed)
[facility]      [facility] °[phone] ° °OPEN ABDOMINAL SURGERY: POST OP INSTRUCTIONS ° °Always review your discharge instruction sheet given to you by the facility where your surgery was performed. ° °IF YOU HAVE DISABILITY OR FAMILY LEAVE FORMS, YOU MUST BRING THEM TO THE OFFICE FOR PROCESSING.  PLEASE DO NOT GIVE THEM TO YOUR DOCTOR. ° °1. A prescription for pain medication may be given to you upon discharge.  Take your pain medication as prescribed, if needed.  If narcotic pain medicine is not needed, then you may take acetaminophen (Tylenol) or ibuprofen (Advil) as needed. °2. Take your usually prescribed medications unless otherwise directed. °3. If you need a refill on your pain medication, please contact your pharmacy. They will contact our office to request authorization.  Prescriptions will not be filled after 5pm or on week-ends. °4. You should follow a light diet the first few days after arrival home, such as soup and crackers, pudding, etc.unless your doctor has advised otherwise. A high-fiber, low fat diet can be resumed as tolerated.   Be sure to include lots of fluids daily. Most patients will experience some swelling and bruising on the chest and neck area.  Ice packs will help.  Swelling and bruising can take several days to resolve °5. Most patients will experience some swelling and bruising in the area of the incision. Ice pack will help. Swelling and bruising can take several days to resolve..  °6. It is common to experience some constipation if taking pain medication after surgery.  Increasing fluid intake and taking a stool softener will usually help or prevent this problem from occurring.  A mild laxative (Milk of Magnesia or Miralax) should be taken according to package directions if there are no bowel movements after 48 hours. °7.  You may have steri-strips (small skin tapes) in place directly over the incision.  These strips should be left on the skin for 7-10 days.  If your  surgeon used skin glue on the incision, you may shower in 24 hours.  The glue will flake off over the next 2-3 weeks.  Any sutures or staples will be removed at the office during your follow-up visit. You may find that a light gauze bandage over your incision may keep your staples from being rubbed or pulled. You may shower and replace the bandage daily. °8. ACTIVITIES:  You may resume regular (light) daily activities beginning the next day--such as daily self-care, walking, climbing stairs--gradually increasing activities as tolerated.  You may have sexual intercourse when it is comfortable.  Refrain from any heavy lifting or straining until approved by your doctor. °a. You may drive when you no longer are taking prescription pain medication, you can comfortably wear a seatbelt, and you can safely maneuver your car and apply brakes °b. Return to Work: ___________________________________ °9. You should see your doctor in the office for a follow-up appointment approximately two weeks after your surgery.  Make sure that you call for this appointment within a day or two after you arrive home to insure a convenient appointment time. °OTHER INSTRUCTIONS:  °_____________________________________________________________ °_____________________________________________________________ ° °WHEN TO CALL YOUR DOCTOR: °1. Fever over 101.0 °2. Inability to urinate °3. Nausea and/or vomiting °4. Extreme swelling or bruising °5. Continued bleeding from incision. °6. Increased pain, redness, or drainage from the incision. °7. Difficulty swallowing or breathing °8. Muscle cramping or spasms. °9. Numbness or tingling in hands or feet or around lips. ° °The clinic staff is available to   answer your questions during regular business hours.  Please dont hesitate to call and ask to speak to one of the nurses if you have concerns.  For further questions, please visit [email or web address]   Mechanical Wound Debridement Wet to  dry dressings twice a day. Mechanical wound debridement is a treatment to remove dead tissue from a wound. This helps the wound heal. The treatment involves cleaning the wound by using mechanical force. This may include:  Wound irrigation. This method involves rinsing the wound out with fluid.  Wet-to-dry dressing. This method involves putting wet dressings into the wound, allowing them to dry, then removing the dry dressings to remove debris and tissue from the wound.  Using a pad or gauze to remove dead tissue and debris from the wound.  Pulsatile lavage. This method involves cleaning the wound with a pressurized stream of fluid. Depending on the wound, you may need to repeat this procedure or change to another form of debridement as your wound starts to heal. Tell a health care provider about:  Any allergies you have.  All medicines you are taking, including vitamins, herbs, eye drops, creams, and over-the-counter medicines.  Any blood disorders you have.  Any medical conditions you have, or have had, especially conditions that cause a decrease in blood flow to the wound area, such as peripheral vascular disease, or conditions that affect your body's defense (immune) system or white blood count.  Any surgeries you have had.  Whether you are pregnant or may be pregnant. What are the risks? Generally, this is a safe procedure. However, problems may occur, including:  Infection.  Bleeding.  Damage to healthy tissue in and around your wound.  Soreness or pain.  Failure of the wound to heal.  Scarring. What happens during the procedure?   Your health care provider may apply a numbing medicine (topical anesthetic) to the wound.  Your health care provider may irrigate your wound with a germ-free salt solution (saline). This helps to loosen or remove debris, bacteria, and dead tissue.  Depending on the type of mechanical wound debridement you are having, your health care  provider may do one of the following: ? Put a dressing on your wound. You may have a dry gauze pad placed into the wound. Your health care provider will remove the gauze after the wound is dry. Any dead tissue and debris that has dried into the gauze will be lifted out of the wound (wet-to-dry debridement). ? Use a type of pad (monofilament fiber debridement pad) that has a fluffy surface on one side that picks up dead tissue and debris from your wound. Your health care provider wets the pad and wipes it over your wound for several minutes. ? Flush (irrigate) your wound with a pressurized stream of solution such as saline or water (pulsatile lavage).  Once your health care provider is finished, he or she may apply a light dressing to your wound. The procedure may vary among health care providers and hospitals. What happens after the procedure?  You may get medicine for pain. Summary  Mechanical wound debridement is a treatment to remove dead tissue from a wound. This helps the wound heal.  Depending on the wound, you may need to repeat this procedure or change to another form of debridement as your wound starts to heal.  This treatment may include: wound irrigation, wet-to-dry dressing, using a pad or gauze to remove dead tissue and debris from the wound, or pulsatile lavage. This information is not  intended to replace advice given to you by your health care provider. Make sure you discuss any questions you have with your health care provider. Document Released: 10/20/2014 Document Revised: 01/06/2018 Document Reviewed: 01/06/2018 Elsevier Patient Education  2020 [employer].

## 2018-08-19 NOTE — Progress Notes (Signed)
Patient does not feel comfortable with nursing staff getting her OOB (to start mobilizing).  Patient is now turning better and using bedpan (instead of purewick). She would like for PT to work with her first to get OOB. Roderick Pee

## 2018-08-19 NOTE — Progress Notes (Signed)
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 Her condition rapidly deteriorated needing transfer to ICU on day of admission for persistent hypotension and lactic acidosis with no response with aggressive IV fluid resuscitation and consistent with septic shock as she ultimately required pressor support, intubation for acute proximal respiratory failure, acute renal failure.Patient underwent exploratory laparotomy on 6/10 to progressively worsening abdominal exam concerning for peritonitis and was found to have ischemic colitis requiring abdominal colectomy, ileostomy placement and cholecystectomy.   Hospital course complicated by prolonged weaning of pressor support, concern for aspiration given hypoxia and elevated requirement (6/14), and change in mentation though CT head at that time remained negative attributed to Dilaudid. Most recently found to have fluid collection in her abdomen. Underwent aspiration and drain placement by interventional radiology on 6/27.  New events last 24 hours / Subjective: No issues overnight.   Assessment & Plan:   Principal Problem:   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 Active Problems:   Hypothyroidism   History of Hirschsprung's disease   Hypotension   Elevated lactic acid level   CKD (chronic kidney disease) stage 3, GFR 30-59 ml/min (HCC)   Diabetes (HCC)   Hyperlipidemia   Acute respiratory failure (HCC)   Sepsis (HCC)   Pressure injury of skin  Moderate protein malnutrition (HCC)   Hyperphosphatemia   Abdominal fluid collection   Ischemic colitis/acute cholecystitis-s/p total abdominal colectomy, [name], cholecystectomy 6/10: Wound VAC removed 6/29. Being followed by general surgery and wound. JP drain x2 place with pus drainage. Now on oral diet. TPN weaned off. Calorie count.    Left abdominal abscess:  Foul smelling vac output noted so CT abd was done, which showed loculated fluid collection concerning for abscess s/p aspiration and drain placement by IR on 6/27 and Zosyn started for 5 days on 6/27.Wound culture growing Enterococcus faecalis-sensitive to Unasyn/vancomycin.  Follow-up on culture. WBC is stable afebrile and clinically improving. On unasyn. Repeat CT abdomen pelvis reveals later collection with air-fluid levels, indwelling drainage catheter, mildly decreased since 6/26. Plan for drain injection and repeat CT in 10-14 days.   Anemia of chronic disease S/p 1 unit PRBC 6/29 and improved. No acute blood loss noted. Iron panel consistent with anemia of chronic disease. Hgb stable.   Moderate protein calorie malnutrition in patient with morbid obesity. Secondary to patient's abdominal surgery/poor absorption.Prealbumin 11.5. Off TPN and tolerating diet, on calorie count appreciate dietitian input. Meets criteria for morbid obesity with BMI greater than 40.    Septic shock, resolved. Likely bacterial translocation from ischemic colitis, no longer requiring pressor support.  Currently on Unasyn for left abdominal abscess.  Acute hypoxic respiratory failure, resolved. Now on RA. she was Intubated on 6/10, extubated on 6/15. Suspected aspiration pneumonia (right lower lobe consolidation on imaging) as well as severe sepsis from colitis.  Encourage ambulation/IS   AKI on CKD, Stage 3 resolved.Peak creatinine of 3. AKI from ATN. Baseline 1.4-1.6 (per care everywhere). Avoid nephrotoxins   Type 2 diabetes.  Stable. Continue sliding scale.  Hypothyroidism Continue Synthroid   Deconditioning, continue  to work with PT/OT recs SNF once medically stable.  Pressure injury of sacrum, not present on admission.Skin intact, continue local care, continue offloading.  Bilateral ankle pain: Uric acid level normal.  Continue pain control.   DVT prophylaxis: SCD Code Status: Full Family Communication: Spoke with son over the phone. He voiced concern regarding PT only coming by 1-2 times a week.  Disposition Plan: DC to SNF once insurance auth and repeat COVID pending    Consultants:   PCCM  General surgery  IR  Procedures:   Exploratory laparotomy, total abdominal colectomy, ileostomy, cholecystectomy 6/10 - Dr. Dalbert Batman   Aspiration of residual fluid plus drain placement by IR 6/27  Antimicrobials:  Anti-infectives (From admission, onward)   Start     Dose/Rate Route Frequency Ordered Stop   08/12/18 1600  Ampicillin-Sulbactam (UNASYN) 3 g in sodium chloride 0.9 % 100 mL IVPB     3 g 200 mL/hr over 30 Minutes Intravenous Every 6 hours 08/12/18 1358     08/09/18 0900  piperacillin-tazobactam (ZOSYN) IVPB 3.375 g  Status:  Discontinued     3.375 g 12.5 mL/hr over 240 Minutes Intravenous Every 8 hours 08/09/18 0822 08/12/18 1358   08/06/18 1400  fluconazole (DIFLUCAN) tablet 150 mg     150 mg Oral Daily 08/06/18 1205 08/08/18 0941   07/29/18 0600  meropenem (MERREM) 1 g in sodium chloride 0.9 % 100 mL IVPB  Status:  Discontinued     1 g 200 mL/hr over 30 Minutes Intravenous Every 12 hours 07/29/18 0457 08/03/18 1048   07/29/18 0500  ceFEPIme (MAXIPIME) 2 g in sodium chloride 0.9 % 100 mL IVPB  Status:  Discontinued     2 g 200 mL/hr over 30 Minutes Intravenous Every 24 hours 07/29/18 0436 07/29/18 0457   07/26/18 1400  piperacillin-tazobactam (ZOSYN) IVPB 3.375 g  Status:  Discontinued     3.375 g 12.5 mL/hr over 240 Minutes Intravenous Every 6 hours 07/26/18 0906 07/26/18 1339    07/26/18 1400  cefTRIAXone (ROCEPHIN) 1 g in sodium chloride 0.9 % 100 mL IVPB  Status:  Discontinued     1 g 200 mL/hr over 30 Minutes Intravenous Every 24 hours 07/26/18 1339 07/29/18 0436   07/23/18 0200  piperacillin-tazobactam (ZOSYN) IVPB 2.25 g  Status:  Discontinued     2.25 g 100 mL/hr over 30 Minutes Intravenous Every 6 hours 07/22/18 2248 07/26/18 0906   07/22/18 1800  piperacillin-tazobactam (ZOSYN) IVPB 3.375 g  Status:  Discontinued     3.375 g 12.5 mL/hr over 240 Minutes Intravenous Every 8 hours 07/22/18 0953 07/22/18 2248   07/22/18 1000  piperacillin-tazobactam (ZOSYN) IVPB 3.375 g     3.375 g 100 mL/hr over 30 Minutes Intravenous  Once 07/22/18 0953 07/22/18 1239   07/22/18 0745  ciprofloxacin (CIPRO) IVPB 400 mg     400 mg 200 mL/hr over 60 Minutes Intravenous  Once 07/22/18 0739 07/22/18 0921   07/22/18 0745  metroNIDAZOLE (FLAGYL) IVPB 500 mg     500 mg 100 mL/hr over 60 Minutes Intravenous  Once 07/22/18 0739 07/22/18 1024       Objective: Vitals:   08/18/18 0531 08/18/18 1438 08/18/18 2032 08/19/18 0555  BP: (!) 157/66 (!) 168/71 (!) 175/77 (!) 157/73  Pulse: 87 94 99 87  Resp: 19 (!) 21 20 20   Temp: 98 F (36.7 C) 98.4 F (36.9 C) 98.5 F (36.9 C) 97.9 F (36.6 C)  TempSrc: Oral Oral Oral Oral  SpO2: 94% 95% 98%  97%  Weight:      Height:        Intake/Output Summary (Last 24 hours) at 08/19/2018 1000 Last data filed at 08/19/2018 0651 Gross per 24 hour  Intake 1036.94 ml  Output 1475 ml  Net -438.06 ml   Filed Weights   07/31/18 1016 08/07/18 0551 08/14/18 1116  Weight: 107.2 kg 110.1 kg 103 kg    Examination: General exam: Appears calm and comfortable  Respiratory system: Clear to auscultation. Respiratory effort normal. Cardiovascular system: S1 & S2 heard, RRR. No JVD, murmurs, rubs, gallops or clicks. No pedal edema. Gastrointestinal system: Abdomen is nondistended, soft and nontender. No organomegaly or masses felt. Normal bowel sounds  heard. Central nervous system: Alert and oriented. No focal neurological deficits. Extremities: Symmetric 5 x 5 power. Skin: No rashes, lesions or ulcers Psychiatry: Judgement and insight appear normal. Mood & affect appropriate.    Data Reviewed: I have personally reviewed following labs and imaging studies  CBC: Recent Labs  Lab 08/14/18 0355 08/15/18 0520 08/16/18 0321 08/17/18 0257  WBC 7.3 6.1 4.7 6.1  HGB 7.7* 7.6* 7.6* 8.0*  HCT 26.2* 25.4* 26.3* 26.4*  MCV 94.6 93.4 93.6 92.0  PLT 289 264 289 288   Basic Metabolic Panel: Recent Labs  Lab 08/14/18 0355 08/15/18 0520 08/16/18 0321 08/17/18 0257  NA 138 138 138 139  K 4.5 4.2 3.9 4.0  CL 112* 111 111 112*  CO2 20* 23 21* 22  GLUCOSE 145* 120* 127* 102*  BUN 27* 23 22 22   CREATININE 1.29* 1.01* 0.97 0.87  CALCIUM 8.0* 8.0* 8.0* 8.1*   GFR: Estimated Creatinine Clearance: 58.1 mL/min (by C-G formula based on SCr of 0.87 mg/dL). Liver Function Tests: No results for input(s): AST, ALT, ALKPHOS, BILITOT, PROT, ALBUMIN in the last 168 hours. No results for input(s): LIPASE, AMYLASE in the last 168 hours. No results for input(s): AMMONIA in the last 168 hours. Coagulation Profile: No results for input(s): INR, PROTIME in the last 168 hours. Cardiac Enzymes: No results for input(s): CKTOTAL, CKMB, CKMBINDEX, TROPONINI in the last 168 hours. BNP (last 3 results) No results for input(s): PROBNP in the last 8760 hours. HbA1C: No results for input(s): HGBA1C in the last 72 hours. CBG: Recent Labs  Lab 08/18/18 0740 08/18/18 1128 08/18/18 1635 08/18/18 2026 08/19/18 0747  GLUCAP 112* 170* 125* 142* 101*   Lipid Profile: No results for input(s): CHOL, HDL, LDLCALC, TRIG, CHOLHDL, LDLDIRECT in the last 72 hours. Thyroid Function Tests: No results for input(s): TSH, T4TOTAL, FREET4, T3FREE, THYROIDAB in the last 72 hours. Anemia Panel: No results for input(s): VITAMINB12, FOLATE, FERRITIN, TIBC, IRON,  RETICCTPCT in the last 72 hours. Sepsis Labs: No results for input(s): PROCALCITON, LATICACIDVEN in the last 168 hours.  Recent Results (from the past 240 hour(s))  Aerobic/Anaerobic Culture (surgical/deep wound)     Status: None   Collection Time: 08/09/18  2:52 PM   Specimen: Abscess  Result Value Ref Range Status   Specimen Description   Final    ABSCESS Performed at [facility], [address], [place], [place]    Special Requests   Final    NONE Performed at [facility], [address], [place], [place]    Gram Stain   Final    ABUNDANT WBC PRESENT, PREDOMINANTLY PMN ABUNDANT GRAM POSITIVE COCCI IN PAIRS ABUNDANT GRAM NEGATIVE RODS Performed at [facility], [address], [place], [place]  Culture   Final    ABUNDANT ENTEROCOCCUS FAECALIS FEW MIXED ANAEROBIC FLORA PRESENT.  CALL LAB IF FURTHER IID REQUIRED.    Report Status 08/12/2018 FINAL  Final   Organism ID, Bacteria ENTEROCOCCUS FAECALIS  Final      Susceptibility   Enterococcus faecalis - MIC*    AMPICILLIN <=2 SENSITIVE Sensitive     VANCOMYCIN 2 SENSITIVE Sensitive     GENTAMICIN SYNERGY SENSITIVE Sensitive     * ABUNDANT ENTEROCOCCUS FAECALIS  Novel Coronavirus, NAA (hospital order; send-out to ref lab)     Status: None   Collection Time: 08/15/18  1:08 PM   Specimen: Nasopharyngeal Swab; Respiratory  Result Value Ref Range Status   SARS-CoV-2, NAA NOT DETECTED NOT DETECTED Final    Comment: (NOTE) This test was developed and its performance characteristics determined by [employer]. This test has not been FDA cleared or approved. This test has been authorized by FDA under an Emergency Use Authorization (EUA). This test is only authorized for the duration of time the declaration that circumstances exist justifying the authorization of the emergency use of in vitro diagnostic tests for detection of SARS-CoV-2 virus  and/or diagnosis of COVID-19 infection under section 564(b)(1) of the Act, 21 U.S.C. 080EMV-3(K)(1), unless the authorization is terminated or revoked sooner. When diagnostic testing is negative, the possibility of a false negative result should be considered in the context of a patient's recent exposures and the presence of clinical signs and symptoms consistent with COVID-19. An individual without symptoms of COVID-19 and who is not shedding SARS-CoV-2 virus would expect to have a negative (not detected) result in this assay. Performed  At: [facility] [address] [place], [place] [id number] Jolene Schimke MD YF:[id number]    Coronavirus Source NASOPHARYNGEAL  Final    Comment: Performed at [facility], [address] [place], [place] [address]       Radiology Studies: No results found.    Scheduled Meds: . acetaminophen  1,000 mg Oral Q6H  . feeding supplement (ENSURE ENLIVE)  237 mL Oral TID BM  . feeding supplement (PRO-STAT SUGAR FREE 64)  30 mL Oral TID  . insulin aspart  0-15 Units Subcutaneous TID AC & HS  . levothyroxine  112 mcg Oral Q0600  . mouth rinse  15 mL Mouth Rinse BID  . nystatin  5 mL Oral QID  . nystatin   Topical QODAY  . olopatadine  1 drop Both Eyes BID  . pantoprazole  40 mg Oral QHS  . sodium chloride flush  10-40 mL Intracatheter Q12H  . sodium chloride flush  5 mL Intracatheter Q8H  . tobramycin-dexamethasone  1 application Both Eyes BID   Continuous Infusions: . sodium chloride 250 mL (08/18/18 0628)  . ampicillin-sulbactam (UNASYN) IV 3 g (08/19/18 0646)     LOS: 28 days    Time spent: 20 minutes   Noralee Stain, DO [facility] [email or web address] 08/19/2018, 10:00 AM

## 2018-08-20 LAB — GLUCOSE, CAPILLARY
Glucose-Capillary: 90 mg/dL (ref 70–99)
Glucose-Capillary: 144 mg/dL — ABNORMAL HIGH (ref 70–99)

## 2018-08-20 MED ORDER — OXYCODONE HCL 5 MG PO TABS: 5.0000 mg | ORAL_TABLET | ORAL | 0 refills | Status: AC | PRN

## 2018-08-20 MED ORDER — ACETAMINOPHEN 500 MG PO TABS: 1000.0000 mg | ORAL_TABLET | Freq: Four times a day (QID) | ORAL | 0 refills | Status: AC

## 2018-08-20 MED ORDER — AMOXICILLIN-POT CLAVULANATE 875-125 MG PO TABS: 1.0000 | ORAL_TABLET | Freq: Two times a day (BID) | ORAL | 0 refills | Status: AC

## 2018-08-20 MED ORDER — INSULIN ASPART 100 UNIT/ML ~~LOC~~ SOLN: 0.0000 [IU] | Freq: Three times a day (TID) | SUBCUTANEOUS | 11 refills | Status: DC

## 2018-08-20 MED ORDER — AMOXICILLIN-POT CLAVULANATE 875-125 MG PO TABS
1.0000 | ORAL_TABLET | Freq: Two times a day (BID) | ORAL | Status: DC
  Administered 2018-08-20: 1 via ORAL
  Filled 2018-08-20: qty 1

## 2018-08-20 MED ORDER — TRAZODONE HCL 50 MG PO TABS: 50.0000 mg | ORAL_TABLET | Freq: Every evening | ORAL | Status: DC | PRN

## 2018-08-20 NOTE — TOC Progression Note (Signed)
Transition of Care [facility]) - Progression Note    Patient Details  Name: Hailey Hughes MRN: [id number] Date of Birth: 03-10-1940  [facility] of [facility]) CM/SW Contact  Servando Snare, [place] Phone Number: 08/20/2018, 11:28 AM  Clinical Narrative:   Patient and family chose bed at [facility]. Patient will report to room 27B. LCSW faxed dc docs to facility.   RN report #: [phone]    Expected Discharge Plan: Skilled Nursing Facility Barriers to Discharge: No Barriers Identified  Expected Discharge Plan and Services Expected Discharge Plan: [place]   Discharge Planning Services: CM Consult   Living arrangements for the past 2 months: Single Family Home Expected Discharge Date: 08/20/18               DME Arranged: N/A DME Agency: NA       HH Arranged: NA HH Agency: NA         Social Determinants of Health (SDOH) Interventions    Readmission Risk Interventions No flowsheet data found.

## 2018-08-20 NOTE — Progress Notes (Signed)
Nutrition Follow-up  DOCUMENTATION CODES:   Obesity unspecified  INTERVENTION:   - Continue Prostat liquid protein PO 30 ml TID with meals, each supplement provides 100 kcal, 15 grams protein.  NUTRITION DIAGNOSIS:   Increased nutrient needs related to acute illness as evidenced by estimated needs.  Ongoing.  GOAL:   Patient will meet greater than or equal to 90% of their needs  Progressing.  MONITOR:   PO intake, Supplement acceptance, Labs, Weight trends, Skin  ASSESSMENT:   78 y.o. female with medical history significant for HTN, hyperlipidemia, prior abdominal surgery d/t megacolon when she was 78 years old, hypothyroidism, type 2 DM, and rheumatoid arthritis. She presented to [facility] due to abdominal pain, N/V x1 day. Stated she has been experiencing generalized weakness and thought it was due to constipation which she was experiencing for the 3 days PTA. Had an enema with minimal hard stool output 6/8. Since then has been experiencing worsening abdominal cramping with nausea and vomiting x2.  **RD working remotely**  Patient currently consuming 25-60% of meals at this time. PO intakes seem to be improving. Pt is taking 2/3 of Prostat supplements ordered. Patient awaiting discharge to SNF.  Per weight records, pt is now -15 lbs since 6/25. Per I/Os: -9.6L since admit.  Medications reviewed. Labs reviewed: CBGs: 90-129  Diet Order:   Diet Order            DIET SOFT Room service appropriate? Yes; Fluid consistency: Thin  Diet effective now              EDUCATION NEEDS:   Not appropriate for education at this time  Skin:  Skin Assessment: Skin Integrity Issues: Skin Integrity Issues:: DTI DTI: sacrum (new 6/19) Incisions: abdominal (6/10)  Last BM:  7/7  Height:   Ht Readings from Last 1 Encounters:  07/23/18 5\' 2"  (1.575 m)    Weight:   Wt Readings from Last 1 Encounters:  08/14/18 103 kg    Ideal Body Weight:  50 kg  BMI:  Body mass index is  41.53 kg/m.  Estimated Nutritional Needs:   Kcal:  2100-2300 kcal  Protein:  100-115 grams  Fluid:  >/= 1.8 L/day  Clayton Bibles, MS, RD, LDN [place] Dietitian Pager: [phone] After Hours Pager: [phone]

## 2018-08-20 NOTE — Progress Notes (Signed)
28 Days Post-Op    CC: Abdominal pain  Subjective: Tolerating soft diet well. Denies any n/v. Feeling reasonably well  Objective: Vital signs in last 24 hours: Temp:  [98.2 F (36.8 C)] 98.2 F (36.8 C) (07/08 0704) Pulse Rate:  [84-87] 87 (07/08 0704) Resp:  [12-16] 16 (07/08 0704) BP: (159-160)/(69-70) 159/69 (07/08 0704) SpO2:  [95 %-99 %] 99 % (07/08 0704) Last BM Date: 08/19/18  Ileostomy 975 Afebrile, VS  CT 7/3 -  Left peritoneal loculated collection with air-fluid level with indwelling percutaneous pigtail drainage catheter, mildly decreased since 08/08/2018 CT abdomen study. 2. Additional small left upper quadrant peritoneal collections in the perisplenic and subdiaphragmatic locations, stable, as detailed. No new fluid collections. 3. Stable free air in a horizontal curvilinear configuration abutting Hartmann's pouch suture line on the left. 4. Small dependent bilateral pleural effusions, left greater than right, slightly increased bilaterally. 5. Right lower lobe 7 mm solid pulmonary nodule, stable since  Intake/Output from previous day: 07/07 0701 - 07/08 0700 In: 1851.1 [P.O.:1657; IV Piggyback:184.1] Out: 1505 [Urine:450; Drains:80; Stool:975] Intake/Output this shift: No intake/output data recorded.  General appearance: alert, cooperative and no distress Resp: clear to auscultation bilaterally GI: soft, non-tender; bowel sounds normal; no masses,  no organomegaly and ileostomy working well, drains both with Jaxx Huish turbid fluid, stable in appearance.    Lab Results:  No results for input(s): WBC, HGB, HCT, PLT in the last 72 hours.  BMET No results for input(s): NA, K, CL, CO2, GLUCOSE, BUN, CREATININE, CALCIUM in the last 72 hours. PT/INR No results for input(s): LABPROT, INR in the last 72 hours.  No results for input(s): AST, ALT, ALKPHOS, BILITOT, PROT, ALBUMIN in the last 168 hours.   Lipase     Component Value Date/Time   LIPASE 12  07/25/2018 0500     Medications: . acetaminophen  1,000 mg Oral Q6H  . feeding supplement (ENSURE ENLIVE)  237 mL Oral TID BM  . feeding supplement (PRO-STAT SUGAR FREE 64)  30 mL Oral TID  . insulin aspart  0-15 Units Subcutaneous TID AC & HS  . levothyroxine  112 mcg Oral Q0600  . mouth rinse  15 mL Mouth Rinse BID  . nystatin  5 mL Oral QID  . nystatin   Topical QODAY  . olopatadine  1 drop Both Eyes BID  . pantoprazole  40 mg Oral QHS  . sodium chloride flush  10-40 mL Intracatheter Q12H  . sodium chloride flush  5 mL Intracatheter Q8H  . tobramycin-dexamethasone  1 application Both Eyes BID    Assessment/Plan RLL pneumonia - Rx with Merrem Completed VDRF,resolved Type 2 diabetes Hypothyroid- on Synthroid Rheumatoid arthritis OSA Morbid obesity BMI 44 AKI - Cr trending down Malnutrition - prealbumin10.7yesterday-nutrition consult/calorie count Klebsiella UTI, resolved Anemia - being transfused 6/29 Thrombocytopenia -resolved  Septic shock Ischemic colitis, acute cholecystitis S/pExploratory laparotomy, total abdominal colectomy, [name], cholecystectomy-6/10-Dr. Derrell Lolling - POD#28 -CT scan 6/26:Loculated air-fluid collection anterior left hemidiaphragm adjacent small bowel measuring 15.7 x 7.7 x 4.8 cm -S/p IRdrain 08/09/18 - Midline wound VAC discontinued 08/11/2018-wet-to-dryTID - Having decent bowel function currently. -Onsoft diet,appreciate nutrition input, continue to work on increasing PO intake - work on mobilizing more -concern for rectal stump leak, rectal drainage decreasing - CT scan 7/3   ID -Meropenem completed,Diflucan 6/24-6/26;PODiflucan6/23>6/26,zosyn 6/26>6/30; Unasyn 6/30>> day 7 FEN -IVF,soft diet VTE -SCDs Follow up -Dr. Marnette Burgess - son Onalee Hua [phone], husband Henreitta Cea [phone]  Plan: Continue drains.  Wound slowly improving. She  needs to be OOB and mobilized.  Anticipate discharge to  SNF this week - will need follow-up imaging in 1-2 wks to monitor intra-abdominal fluid collections and drains   LOS: 29 days     Sharon [facility] 08/20/2018 [phone]

## 2018-08-20 NOTE — Discharge Summary (Signed)
Physician Discharge Summary  Hailey Hughes IRS:[id number] DOB: 1940/09/05 DOA: 07/22/2018  PCP: Barbie Banner, MD  Admit date: 07/22/2018 Discharge date: 08/20/2018  Admitted From: home  Disposition:  SNF   Recommendations for Outpatient Follow-up:  1. Follow up with PCP in 1-2 weeks 2. Please obtain BMP/CBC in one week 3. 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 Her condition rapidly deteriorated needing transfer to ICU on day of admission for persistent hypotension and lactic acidosis with no response with aggressive IV fluid resuscitation and consistent with septic shock as she ultimately required pressor support, intubation for acute hypoxic respiratory failure, acute renal failure.Patient underwent exploratory laparotomy on 6/10 due to progressively worsening abdominal exam concerning for peritonitis and was found to have ischemic colitis requiring abdominal colectomy, ileostomy placement and cholecystectomy.   Hospital course complicated by prolonged weaning of pressor support, concern for aspiration given hypoxia and change in mentation though CT head at that time remained negative attributed to Dilaudid. Most recently found to have fluid collection in her abdomen. Underwent aspiration and drain placement by interventional radiology on 6/27.   Discharge Diagnoses:  Principal Problem:   Gangrenous ischemic colitis s/p colectomy/ileostomy  07/23/2018 Active Problems:   Hypothyroidism   History of Hirschsprung's disease   Hypotension   Elevated lactic acid level   CKD (chronic kidney disease) stage 3, GFR 30-59 ml/min (HCC)   Diabetes (HCC)   Hyperlipidemia   Acute respiratory failure (HCC)   Sepsis (HCC)   Pressure injury of skin   Moderate protein malnutrition (HCC)   Hyperphosphatemia   Abdominal fluid collection  Ischemic colitis/acute cholecystitis-s/ptotal abdominal colectomy, [name], cholecystectomy 6/10: Wound VAC removed 6/29. Being followed by general surgery and wound. JP drain x2 place with pusdrainage.Now on oral diet. TPN weaned off. Encourage oral diet and supplements.  Left abdominal abscess:Foul smelling vac outputnoted,CT abd was done, which showed loculated fluid collection concerning for abscess s/p aspiration and drain placement by IR on 6/27 and Zosyn started for 5 days on 6/27.Wound culture growingEnterococcus faecalis-sensitive to Unasyn/vancomycin. WBC is stable afebrile andclinically improving. On unasyn. Repeat CT abdomen pelvis reveals later collection with air-fluid levels, indwelling drainage catheter, mildly decreased since 6/26. Plan for drain injection and repeat CT in 10-14 days.  -IV Zosyn 6/26-6/30 -IV Unasyn 6/30-08/20/2018 Will continue on oral Augmentin for 10 more days until surgery follow-up.  Anemia of chronic diseaseS/p1 unit PRBC 6/29 and improved.No acute blood loss noted. Iron panel consistent with anemia of chronic disease.Hgb stable.   Moderate protein calorie malnutrition in patient with morbid obesity.Secondary to patient's abdominal surgery/poor absorption.Prealbumin 11.5. Off TPN and tolerating diet, on calorie count appreciate dietitian input. Meets criteria for morbid obesity with BMI greater than 40.   Septic shock, resolved.   Acute hypoxic respiratory failure, resolved. Now on RA. she was Intubated on 6/10, extubated on 6/15. Suspected  aspiration pneumonia (right lower lobe consolidation on imaging) as well as severe sepsis from colitis. Encourage ambulation/IS   AKI on CKD, Stage 3 resolved.Peak creatinine of 3.  AKI from [facility]. Baseline 1.4-1.6 (per care everywhere). Avoid nephrotoxins  Type 2 diabetes. Stable. Continue sliding scale.  Patient was on metformin at home.  She will benefit with being on sliding scale insulin until good oral intake and clinical improvement.  Hypothyroidism Continue Synthroid   Deconditioning, continue to work with PT/OT recs SNFonce medically stable.  Patient is clinically stable.  Discussed with surgery.  Discontinue PICC line.  She can be transferred to a skilled level of care today to continue inpatient therapies.  She will need follow-up with surgery in 10 to 14 days.   Discharge Instructions  Discharge Instructions    Call MD for:  redness, tenderness, or signs of infection (pain, swelling, redness, odor or green/yellow discharge around incision site)   Complete by: As directed    Call MD for:  severe uncontrolled pain   Complete by: As directed    Change dressing   Complete by: As directed    Wet to dry dressing to open wound twice a day.   Diet - low sodium heart healthy   Complete by: As directed    Discharge instructions   Complete by: As directed    Routine colostomy care Drain care of the abdomen   Increase activity slowly   Complete by: As directed      Allergies as of 08/20/2018      Reactions   Bee Venom Anaphylaxis   Epinephrine Other (See Comments)   REACTION: Nervousness       Medication List    STOP taking these medications   amLODipine-benazepril 5-20 MG capsule Commonly known as: LOTREL   aspirin 81 MG tablet   bumetanide 2 MG tablet Commonly known as: BUMEX   celecoxib 200 MG capsule Commonly known as: CELEBREX   febuxostat 40 MG tablet Commonly known as: ULORIC   fexofenadine 180 MG tablet Commonly known as: ALLEGRA   fluticasone  50 MCG/ACT nasal spray Commonly known as: FLONASE   HYDROcodone-acetaminophen 7.5-325 MG tablet Commonly known as: NORCO   montelukast 10 MG tablet Commonly known as: SINGULAIR   tiZANidine 4 MG capsule Commonly known as: ZANAFLEX   tobramycin-dexamethasone ophthalmic ointment Commonly known as: TOBRADEX   triamcinolone cream 0.1 % Commonly known as: KENALOG     TAKE these medications   acetaminophen 500 MG tablet Commonly known as: TYLENOL Take 2 tablets (1,000 mg total) by mouth every 6 (six) hours.   amoxicillin-clavulanate 875-125 MG tablet Commonly known as: AUGMENTIN Take 1 tablet by mouth every 12 (twelve) hours for 10 days.   atorvastatin 20 MG tablet Commonly known as: LIPITOR Take 20 mg by mouth every evening.   insulin aspart 100 UNIT/ML injection Commonly known as: novoLOG Inject 0-15 Units into the skin 4 (four) times daily -  before meals and at bedtime.   levothyroxine 112 MCG tablet Commonly known as: SYNTHROID Take 112 mcg by mouth daily.   metFORMIN 500 MG 24 hr tablet Commonly known as: GLUCOPHAGE-XR Take 1,000 mg by mouth every evening.   metoprolol succinate 50 MG 24 hr tablet Commonly known as: TOPROL-XL Take 50 mg by mouth daily.   olopatadine 0.1 % ophthalmic solution Commonly known as: PATANOL Place 1 drop into both eyes daily as needed for allergies.   oxyCODONE 5 MG immediate release tablet Commonly known as: Oxy IR/ROXICODONE Take 1 tablet (5 mg total) by mouth every 4 (four) hours as needed for up to 5 days for moderate pain.   pantoprazole 40 MG tablet Commonly known as:  PROTONIX Take 1 tablet (40 mg total) by mouth daily.   senna 8.6 MG tablet Commonly known as: SENOKOT Take 1 tablet by mouth daily as needed for constipation.   traZODone 50 MG tablet Commonly known as: DESYREL Take 1 tablet (50 mg total) by mouth at bedtime as needed for sleep.            Discharge Care Instructions  (From admission, onward)          Start     Ordered   08/19/18 0000  Change dressing    Comments: Wet to dry dressing to open wound twice a day.   08/19/18 2130         Follow-up Information    Claud Kelp, MD Follow up.   Specialty: General Surgery Why: our office will call with an appointment.  If coming from the Nursing facility, you need a staff or family member with you. If you cannot have an escort this day please reschedule. Bring photo ID and insurance information. Contact information: [address] [place] [address] [phone]          Allergies  Allergen Reactions  . Bee Venom Anaphylaxis  . Epinephrine Other (See Comments)    REACTION: Nervousness     Consultations:  General surgery  PCCM   Procedures/Studies: Ct Abdomen Pelvis Wo Contrast  Result Date: 07/29/2018 CLINICAL DATA:  Colitis. Septic shock. Colectomy for ischemic colitis. Liver pressure. EXAM: CT ABDOMEN AND PELVIS WITHOUT CONTRAST TECHNIQUE: Multidetector CT imaging of the abdomen and pelvis was performed following the standard protocol without IV contrast. COMPARISON:  CT 07/22/2018 FINDINGS: Lower chest: Dense consolidation in the LEFT lower lobe with air bronchograms. Mild consolidation in LEFT lower lobe. Bilateral small effusions. Hepatobiliary: No focal hepatic lesion. Postcholecystectomy. Non IV contrast exam. Pancreas: Pancreas is normal. No ductal dilatation. No pancreatic inflammation. Spleen: Normal spleen Adrenals/urinary tract: Adrenal glands normal. No renal obstruction. 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 Bladder is mildly distended. Stomach/Bowel: Stomach is fluid-filled. The duodenum is normal. There is mild dilatation of the small bowel up to 3.2 cm. Loop of small bowel in the LEFT abdomen is edematous with bowel wall measuring up to 20 mm (image 45/5 and 51/2). No evidence of high-grade obstruction. No pneumatosis or intraperitoneal free air identified. RIGHT lower  quadrant end ileostomy noted. There is a fluid within the mesentery associated with the small bowel. One collection of fluid in the RIGHT upper quadrant measures 5.0 by 4.3 cm (image 44/2). This collection is minimal organization. There is a percutaneous drain in the lateral RIGHT abdomen paralleling the distal ileum. Vascular/Lymphatic: Abdominal aorta is normal caliber with atherosclerotic calcification. There is no retroperitoneal or periportal lymphadenopathy. No pelvic lymphadenopathy. Reproductive: Uterus and adnexa grossly normal Other: No intraperitoneal free air. Musculoskeletal: No aggressive osseous lesion. IMPRESSION: 1. Dense consolidation RIGHT lower lobe is concerning for pneumonia. 2. No evidence of bowel obstruction. Edematous small bowel in the LEFT abdomen. No pneumatosis. 3. Mild intraabdominal free fluid. One pocket of fluid in the RIGHT upper quadrant with minimal organization measures 5 cm. 4. Percutaneous drain in the RIGHT abdomen. 5. Post colectomy and end ileostomy. Electronically Signed   By: Genevive Bi [name]   On: 07/29/2018 10:56   Ct Abdomen Pelvis Wo Contrast  Result Date: 07/22/2018 CLINICAL DATA:  Abdominal pain for several days EXAM: CT ABDOMEN AND PELVIS WITHOUT CONTRAST TECHNIQUE: Multidetector CT imaging of the abdomen and pelvis was performed following the standard  protocol without IV contrast. COMPARISON:  MRI from 06/19/2008 FINDINGS: Lower chest: Dependent atelectatic changes are noted. Hepatobiliary: Liver is well visualized and within normal limits. A mild amount of perihepatic ascites is noted. The gallbladder is well distended without significant inflammatory change. Pancreas: Unremarkable. No pancreatic ductal dilatation or surrounding inflammatory changes. Spleen: Normal in size without focal abnormality. Adrenals/Urinary Tract: Adrenal glands are within normal limits. Kidneys are well visualized bilaterally. 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 No obstructive  changes are seen. The ureters and urinary bladder are within normal limits. Stomach/Bowel: Changes of reflux are noted. Small hiatal hernia is noted. Colon is distended with fluid and fecal material although no obstructive lesion is seen. Very mild pericolonic inflammatory changes are noted in the distal transverse colon. This may represent a generalized colitis. No small bowel abnormality is seen. Vascular/Lymphatic: Aortic atherosclerosis. No enlarged abdominal or pelvic lymph nodes. Reproductive: Uterus and bilateral adnexa are unremarkable. Other: Minimal free fluid is noted within the pelvis. Postsurgical changes are noted in the anterior abdominal wall consistent with the given clinical history. Musculoskeletal: Degenerative changes are noted. IMPRESSION: Mild dependent atelectatic changes. Well distended gallbladder without definitive signs of inflammatory change. Colon is mildly distended with fluid and fecal material with very mild pericolonic changes which may represent a generalized colitis. Mild ascites Mild hiatal hernia. Stable right renal angiomyolipoma. Electronically Signed   By: Alcide Clever [name]   On: 07/22/2018 07:33   Dg Chest 1 View  Result Date: 07/29/2018 CLINICAL DATA:  78 y/o  F; shortness of breath. EXAM: CHEST  1 VIEW COMPARISON:  07/29/2018 chest radiograph. FINDINGS: Stable cardiac silhouette given projection and technique. Aortic atherosclerosis with calcification. Stable left-greater-than-right lung base consolidations. Enteric tube extends below the field of view into the abdomen. Right PICC line tip projects over lower SVC. No pleural effusion or pneumothorax. No acute osseous abnormality is evident. IMPRESSION: Stable left-greater-than-right lung base consolidations. Right PICC line tip projects over lower SVC. Enteric tube tip extends below the field of view into the abdomen. Electronically Signed   By: Mitzi Hansen [name]   On: 07/29/2018 22:29   Dg Abd 1  View  Result Date: 07/29/2018 CLINICAL DATA:  78 y/o  F; shortness of breath. EXAM: ABDOMEN - 1 VIEW COMPARISON:  07/29/2018 CT abdomen and pelvis and abdomen radiographs. FINDINGS: Enteric tube tip projects over the gastric body. Advanced rotatory dextrocurvature of lumbar spine. Normal included bowel gas pattern, the right abdomen is excluded. IMPRESSION: Enteric tube tip projects over the gastric body. Electronically Signed   By: Mitzi Hansen [name]   On: 07/29/2018 22:30   Dg Abd 1 View  Result Date: 07/29/2018 CLINICAL DATA:  Abdominal distension. EXAM: ABDOMEN - 1 VIEW COMPARISON:  Abdominal radiographs 07/23/2018. CT of the abdomen and pelvis 07/22/2018. FINDINGS: Moderate prominence of small bowel loops are present following total colectomy and cholecystectomy. Surgical drain is in place. Degenerative changes are noted in the lumbar spine. IMPRESSION: 1. Mild prominence of small bowel loops without obstruction likely represents mild ileus. 2. Total colectomy. Electronically Signed   By: Marin Roberts [name]   On: 07/29/2018 05:26   Dg Abd 1 View  Result Date: 07/23/2018 CLINICAL DATA:  Check gastric catheter placement EXAM: ABDOMEN - 1 VIEW COMPARISON:  None. FINDINGS: Gastric catheter is noted within the stomach. No free air is seen. Paucity of bowel gas is noted. IMPRESSION: Gastric catheter within the stomach. Electronically Signed   By: Alcide Clever [name]   On: 07/23/2018  08:25   Ct Head Wo Contrast  Result Date: 07/29/2018 CLINICAL DATA:  Fever.  Acute mental status changes. EXAM: CT HEAD WITHOUT CONTRAST TECHNIQUE: Contiguous axial images were obtained from the base of the skull through the vertex without intravenous contrast. COMPARISON:  None. FINDINGS: Brain: Mild atrophy and white matter changes are within normal limits for age. No acute infarct, hemorrhage, or mass lesion is present. The ventricles are of normal size. No significant extraaxial fluid collection is  present. Vascular: Atherosclerotic changes are noted in the cavernous internal carotid arteries and at the dural margin of both vertebral arteries. There is no hyperdense vessel. Skull: Calvarium is intact. No focal lytic or blastic lesions are present. No significant extra cranial soft tissue lesions are present. Sinuses/Orbits: The paranasal sinuses and mastoid air cells are clear. The globes and orbits are within normal limits. IMPRESSION: 1. Normal CT appearance of the brain for age. 2. Diffuse atherosclerotic calcifications. Electronically Signed   By: Marin Roberts [name]   On: 07/29/2018 06:31   Ct Abdomen Pelvis W Contrast  Result Date: 08/15/2018 CLINICAL DATA:  Inpatient. Status post total colectomy with ileostomy 07/23/2018 with cholecystectomy. Left abdominal abscess status post percutaneous drainage. EXAM: CT ABDOMEN AND PELVIS WITH CONTRAST TECHNIQUE: Multidetector CT imaging of the abdomen and pelvis was performed using the standard protocol following bolus administration of intravenous contrast. CONTRAST:  OMNIPAQUE IOHEXOL 300 MG/ML  SOLN COMPARISON:  08/08/2018 CT abdomen/pelvis. FINDINGS: Lower chest: Tip of superior approach central venous catheter is seen at the cavoatrial junction. Coronary atherosclerosis. Small dependent bilateral pleural effusions, left greater than right, slightly increased bilaterally. Associated mild-to-moderate dependent bibasilar passive atelectasis. Right lower lobe 7 mm solid pulmonary nodule (series 4/image 17), stable since 07/22/2018 CT. Hepatobiliary: Normal liver size. No liver mass. Cholecystectomy. No biliary ductal dilatation. Pancreas: Normal, with no mass or duct dilation. Spleen: Normal size. No mass. Adrenals/Urinary Tract: Normal adrenals. No hydronephrosis. 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 No additional renal lesions. Normal bladder. Stomach/Bowel: Normal non-distended stomach. Status post total  colectomy with ventral right abdominal and ileostomy. Normal caliber small bowel loops without small bowel wall thickening. Stable appearance of the relatively decompressed heart managed pouch. Stable horizontal curvilinear pocket of extraluminal gas in the posterior peritoneal cavity abutting the Hartmann's pouch suture line on the left (series 2/images 57-58). Vascular/Lymphatic: Atherosclerotic nonaneurysmal abdominal aorta. Patent portal, splenic, hepatic and renal veins. No pathologically enlarged lymph nodes in the abdomen or pelvis. Reproductive: Grossly normal uterus.  No adnexal mass. Other: Left peritoneal 6.0 x 4.2 x 18.5 cm loculated collection with air-fluid level with indwelling percutaneous pigtail drainage catheter (series 2/image 49 and series 5/image 39), mildly decreased from 7.7 x 4.8 x 20.8 cm on 08/08/2018 CT using similar measurement technique. Left lower quadrant percutaneous surgical drain terminates in the right lower peritoneum. Stable small crescent-shaped perisplenic collection measuring 6.2 x 1.8 cm (series 2/image 23) stable tiny collection under the left hemidiaphragm adjacent to the gastric fundus measuring 3.1 x 1.9 cm (series 2/image 15). No new focal fluid collections. Partially healed midline laparotomy wound. Musculoskeletal: No aggressive appearing focal osseous lesions. Marked thoracolumbar spondylosis. IMPRESSION: 1. Left peritoneal loculated collection with air-fluid level with indwelling percutaneous pigtail drainage catheter, mildly decreased since 08/08/2018 CT abdomen study. 2. Additional small left upper quadrant peritoneal collections in the perisplenic and subdiaphragmatic locations, stable, as detailed. No new fluid collections. 3. Stable free air in a horizontal curvilinear configuration abutting Hartmann's pouch suture  line on the left. 4. Small dependent bilateral pleural effusions, left greater than right, slightly increased bilaterally. 5. Right lower lobe 7 mm  solid pulmonary nodule, stable since 07/22/2018 CT. Non-contrast chest CT at 6-12 months is recommended. If the nodule is stable at time of repeat CT, then future CT at 18-24 months (from today's scan) is considered optional for low-risk patients, but is recommended for high-risk patients. This recommendation follows the consensus statement: Guidelines for Management of Incidental Pulmonary Nodules Detected on CT Images:From the Fleischner Society 2017; published online before print (10.1148/radiol.[id number]). 6.  Aortic Atherosclerosis (ICD10-I70.0). Electronically Signed   By: Delbert PhenixJason A Poff [name]   On: 08/15/2018 12:46   Ct Abdomen Pelvis W Contrast  Result Date: 08/08/2018 CLINICAL DATA:  Postoperative abdominal pain, foul-smelling discharge, JP drain, wound VAC EXAM: CT ABDOMEN AND PELVIS WITH CONTRAST TECHNIQUE: Multidetector CT imaging of the abdomen and pelvis was performed using the standard protocol following bolus administration of intravenous contrast. CONTRAST:  100mL OMNIPAQUE IOHEXOL 300 MG/ML SOLN, 30mL OMNIPAQUE IOHEXOL 300 MG/ML SOLN, additional oral enteric contrast COMPARISON:  07/29/2018 FINDINGS: Lower chest: Moderate left, small right pleural effusions and associated atelectasis or consolidation. Hepatobiliary: No focal liver abnormality is seen. Status post cholecystectomy. No biliary dilatation. Pancreas: Unremarkable. No pancreatic ductal dilatation or surrounding inflammatory changes. Spleen: Normal in size without significant abnormality. Adrenals/Urinary Tract: Adrenal glands are unremarkable. Kidneys are normal, without renal calculi, solid lesion, or hydronephrosis. Bladder is unremarkable. Stomach/Bowel: Stomach is within normal limits. Status post colectomy with right lower quadrant end ostomy. Vascular/Lymphatic: Aortic atherosclerosis. No enlarged abdominal or pelvic lymph nodes. Reproductive: No mass or other significant abnormality. Other: Surgical drain is positioned about the  low abdomen, tip in the right lower quadrant. Small volume loculated appearing perisplenic ascites similar to prior (series 2, image 23). Loculated appearing air and fluid collection about the anterior left hemiabdomen adjacent to loops of small bowel measuring approximately 15.7 x 7.7 x 4.8 cm (series 2, image 50, series 6, image 58). Musculoskeletal: No acute or significant osseous findings. IMPRESSION: 1. Loculated appearing air and fluid collection about the anterior left hemiabdomen adjacent to loops of small bowel measuring approximately 15.7 x 7.7 x 4.8 cm (series 2, image 50, series 6, image 58), highly suspicious for abscess. 2. Small volume loculated appearing ascites. 3. Surgical drain in the low abdomen, status post colectomy with right lower quadrant end ostomy. 4.  Pleural effusions. Electronically Signed   By: Lauralyn PrimesAlex  Bibbey [name]   On: 08/08/2018 20:19   Dg Chest Port 1 View  Result Date: 08/02/2018 CLINICAL DATA:  Respiratory failure EXAM: PORTABLE CHEST 1 VIEW COMPARISON:  07/29/2018 chest radiograph. FINDINGS: Interval removal of enteric tube. Right PICC terminates over the right atrium. Stable cardiomediastinal silhouette with normal heart size. No pneumothorax. No pleural effusion. Hazy parahilar and bibasilar lung opacities are not substantially changed. IMPRESSION: 1. Right PICC terminates over the right atrium. 2. No substantial change in hazy parahilar and bibasilar lung opacities. Electronically Signed   By: Delbert PhenixJason A Poff [name]   On: 08/02/2018 07:14   Dg Chest Port 1 View  Result Date: 07/29/2018 CLINICAL DATA:  Hypoxia. EXAM: PORTABLE CHEST 1 VIEW COMPARISON:  One-view chest x-ray 07/28/2018 FINDINGS: Heart size is normal. The patient has been extubated. Left subclavian line is stable. Lung volumes remain low. Aeration of both lungs is slightly improved. Bibasilar airspace opacities remain. Left greater than right pleural effusion is present. IMPRESSION: 1. Improving aeration the  bilateral lower lobe airspace disease.  Disease remains worse on the left. 2. Interval extubation and removal of NG tube. 3. Left subclavian line is stable. 4. Persistent low lung volumes. Electronically Signed   By: Marin Roberts [name]   On: 07/29/2018 05:35   Dg Chest Port 1 View  Result Date: 07/28/2018 CLINICAL DATA:  Acute respiratory failure. EXAM: PORTABLE CHEST 1 VIEW COMPARISON:  07/27/2018.  07/26/2018. FINDINGS: Endotracheal tube, left subclavian line, NG tube in stable position. Heart size normal. Bilateral pulmonary infiltrates/edema and small bilateral pleural effusions again noted. These findings have progressed from prior exam. IMPRESSION: 1.  Lines and tubes in stable position. 2. Progressive bilateral pulmonary infiltrates/edema small bilateral pleural effusions Electronically Signed   By: Maisie Fus  Register   On: 07/28/2018 07:57   Dg Chest Port 1 View  Result Date: 07/27/2018 CLINICAL DATA:  Shortness of breath EXAM: PORTABLE CHEST 1 VIEW COMPARISON:  07/26/2018 FINDINGS: Support devices are in stable position. Bilateral perihilar and lower lobe airspace opacities, slightly improved. Heart is normal size. Suspect small effusions. No acute bony abnormality. IMPRESSION: Bilateral lower lobe airspace opacities and perihilar opacities, slightly improved since prior study. Suspect small effusions. Electronically Signed   By: Charlett Nose [name]   On: 07/27/2018 03:27   Dg Chest Port 1 View  Result Date: 07/26/2018 CLINICAL DATA:  Pleural effusion. EXAM: PORTABLE CHEST 1 VIEW COMPARISON:  07/25/2018 FINDINGS: The patient is again rotated to the right. Endotracheal tube terminates just below the clavicles, well above the carina. Enteric tube courses into the stomach with tip not imaged. Left subclavian catheter terminates over the SVC. The cardiomediastinal silhouette is unchanged. The lungs remain hypoinflated with unchanged pulmonary vascular congestion. Bilateral pleural effusions and  patchy bibasilar airspace opacities also not significantly changed. No pneumothorax is identified. IMPRESSION: Unchanged pulmonary vascular congestion, bilateral pleural effusions, and bibasilar atelectasis or pneumonia. Electronically Signed   By: Sebastian Ache [name]   On: 07/26/2018 07:40   Dg Chest Port 1 View  Result Date: 07/25/2018 CLINICAL DATA:  Respiratory failure EXAM: PORTABLE CHEST 1 VIEW COMPARISON:  07/24/2018 FINDINGS: Cardiac shadow is stable. Endotracheal tube and gastric catheter as well as a left subclavian central line are again noted and stable. Central vascular congestion is noted with evidence of bilateral pleural effusions and bibasilar atelectasis. No new focal infiltrate is seen. IMPRESSION: Relatively stable appearance of the chest when compared with the previous day. Electronically Signed   By: Alcide Clever [name]   On: 07/25/2018 08:00   Dg Chest Port 1 View  Result Date: 07/24/2018 CLINICAL DATA:  Follow-up left basilar atelectasis EXAM: PORTABLE CHEST 1 VIEW COMPARISON:  07/23/2018 FINDINGS: Endotracheal tube, left subclavian central line and gastric catheter are noted. Significant increase in pleural fluid is noted bilaterally right greater than left. Mild underlying vascular congestion is seen as well as patchy left basilar infiltrate. IMPRESSION: Increasing effusions bilaterally. Tubes and lines as described above. Stable left basilar atelectasis. Electronically Signed   By: Alcide Clever [name]   On: 07/24/2018 08:08   Dg Chest Port 1 View  Result Date: 07/23/2018 CLINICAL DATA:  Respiratory failure EXAM: PORTABLE CHEST 1 VIEW COMPARISON:  07/23/2018 FINDINGS: Cardiac shadow is stable. Endotracheal tube, gastric catheter and left subclavian central line are noted. Patchy left basilar atelectasis is seen. No other focal abnormality is noted. Overall inspiratory effort is poor. IMPRESSION: Left basilar atelectasis. Tubes and lines as described. Electronically Signed   By: Alcide Clever [name]   On: 07/23/2018 08:26   Dg Chest  Port 1 View  Result Date: 07/23/2018 CLINICAL DATA:  Sepsis EXAM: PORTABLE CHEST 1 VIEW COMPARISON:  Chest x-ray dated 05/06/2014. FINDINGS: The lung volumes are low. There is a left subclavian central venous catheter in place with tip projecting over the SVC. There is a left basilar airspace opacity. No pneumothorax. There may be trace bilateral pleural effusions. There is no acute osseous abnormality. IMPRESSION: 1. Well-positioned left subclavian central venous catheter without evidence of a pneumothorax. 2. Left basilar airspace opacity concerning for pneumonia. 3. Low lung volumes. Electronically Signed   By: Katherine Mantlehristopher  Green [name]   On: 07/23/2018 03:44   Ct Image Guided Drainage By Percutaneous Catheter  Result Date: 08/10/2018 INDICATION: 78 year old female with a history abdominal abscess EXAM: CT GUIDED DRAINAGE OF ABSCESS MEDICATIONS: The patient is currently admitted to the hospital and receiving intravenous antibiotics. The antibiotics were administered within an appropriate time frame prior to the initiation of the procedure. ANESTHESIA/SEDATION: 1.0 mg IV Versed 50 mcg IV Fentanyl Moderate Sedation Time:  24 The patient was continuously monitored during the procedure by the interventional radiology nurse under my direct supervision. COMPLICATIONS: None TECHNIQUE: Informed written consent was obtained from the patient after a thorough discussion of the procedural risks, benefits and alternatives. All questions were addressed. Maximal Sterile Barrier Technique was utilized including caps, mask, sterile gowns, sterile gloves, sterile drape, hand hygiene and skin antiseptic. A timeout was performed prior to the initiation of the procedure. PROCEDURE: The operative field was prepped with chlorhexidine in a sterile fashion, and a sterile drape was applied covering the operative field. A sterile gown and sterile gloves were used for the procedure. Local  anesthesia was provided with 1% Lidocaine. Once a scout CT was acquired of the abdomen, we targeted the air in fluid collection in the left abdomen. The patient was prepped and draped in the usual sterile fashion. 1% lidocaine was used for local anesthesia. Using CT guidance, modified Seldinger technique was used to place a 10 [place] drain into the fluid and gas collection within the left abdomen. Frankly purulent material was aspirated. Catheter was sutured in position and attached to bulb suction. Patient tolerated the procedure well and remained hemodynamically stable throughout. No complications were encountered and no significant blood loss. FINDINGS: Scout CT demonstrates surgical changes with persisting fluid and gas collection in the left abdomen. This was the collection targeted. Images demonstrate safe placement of 10 French drain into the fluid collection from left anterior approach. IMPRESSION: Status post CT-guided drainage of left abdominal abscess. Signed, Yvone NeuJaime S. Reyne DumasWagner, DO, RPVI [facility] Electronically Signed   By: Gilmer MorJaime  Wagner D.O.   On: 08/10/2018 08:25   [place] Ekg Site Rite  Result Date: 07/29/2018 If Site Rite image not attached, placement could not be confirmed due to current cardiac rhythm.      Subjective: Patient seen and examined on the day of discharge.  Stable overnight.  She was eager to go to a skilled nursing place where she can work with therapies every day.  Remains afebrile.  Has occasional abdominal pain mostly on the left lower quadrant and sometimes referred pain to left shoulder.   Discharge Exam: Vitals:   08/19/18 2223 08/20/18 0704  BP: (!) 160/70 (!) 159/69  Pulse: 84 87  Resp: 12 16  Temp: 98.2 F (36.8 C) 98.2 F (36.8 C)  SpO2: 95% 99%   Vitals:   08/18/18 2032 08/19/18 0555 08/19/18 2223 08/20/18 0704  BP: (!) 175/77 Marland Kitchen(!)  157/73 (!) 160/70 (!) 159/69  Pulse: 99 87 84 87  Resp: Temp: 98.5 F (36.9 C) 97.9 F (36.6 C) 98.2 F (36.8 C) 98.2 F (36.8 C)  TempSrc: Oral Oral Oral Oral  SpO2: 98% 97% 95% 99%  Weight:      Height:        General: Pt is alert, awake, not in acute distress Cardiovascular: RRR, S1/S2 +, no rubs, no gallops Respiratory: CTA bilaterally, no wheezing, no rhonchi Abdominal: Soft, NT, ND, bowel sounds + Ileostomy right lower quadrant with loose stool. Midline surgical scar with dressing Left lower quadrant JP drain with minimal abscess Left lower quadrant indwelling catheter with minimal drainage. Extremities: no edema, no cyanosis    The results of significant diagnostics from this hospitalization (including imaging, microbiology, ancillary and laboratory) are listed below for reference.     Microbiology: Recent Results (from the past 240 hour(s))  Novel Coronavirus, NAA (hospital order; send-out to ref lab)     Status: None   Collection Time: 08/15/18  1:08 PM   Specimen: Nasopharyngeal Swab; Respiratory  Result Value Ref Range Status   SARS-CoV-2, NAA NOT DETECTED NOT DETECTED Final    Comment: (NOTE) This test was developed and its performance characteristics determined by [employer]. This test has not been FDA cleared or approved. This test has been authorized by FDA under an Emergency Use Authorization (EUA). This test is only authorized for the duration of time the declaration that circumstances exist justifying the authorization of the emergency use of in vitro diagnostic tests for detection of SARS-CoV-2 virus and/or diagnosis of COVID-19 infection under section 564(b)(1) of the Act, 21 U.S.C. 161WRU-0(A)(5), unless the authorization is terminated or revoked sooner. When diagnostic testing is negative, the possibility of a false negative result should be considered in the context of a patient's recent exposures and the presence of clinical signs and symptoms consistent with COVID-19. An individual  without symptoms of COVID-19 and who is not shedding SARS-CoV-2 virus would expect to have a negative (not detected) result in this assay. Performed  At: [facility] [address] [place], [place] [id number] Jolene Schimke MD NW:[id number]    Coronavirus Source NASOPHARYNGEAL  Final    Comment: Performed at [facility], [address], [place], [place] [address]  SARS Coronavirus 2 (CEPHEID - Performed in [facility] hospital lab), Hosp Order     Status: None   Collection Time: 08/19/18  2:45 PM   Specimen: Nasopharyngeal Swab  Result Value Ref Range Status   SARS Coronavirus 2 NEGATIVE NEGATIVE Final    Comment: (NOTE) If result is NEGATIVE SARS-CoV-2 target nucleic acids are NOT DETECTED. The SARS-CoV-2 RNA is generally detectable in upper and lower  respiratory specimens during the acute phase of infection. The lowest  concentration of SARS-CoV-2 viral copies this assay can detect is 250  copies / mL. A negative result does not preclude SARS-CoV-2 infection  and should not be used as the sole basis for treatment or other  patient management decisions.  A negative result may occur with  improper specimen collection / handling, submission of specimen other  than nasopharyngeal swab, presence of viral mutation(s) within the  areas targeted by this assay, and inadequate number of viral copies  (<250 copies / mL). A negative result must be combined with clinical  observations, patient history, and epidemiological information. If result is POSITIVE SARS-CoV-2 target nucleic acids are DETECTED. The SARS-CoV-2 RNA is generally  detectable in upper and lower  respiratory specimens dur ing the acute phase of infection.  Positive  results are indicative of active infection with SARS-CoV-2.  Clinical  correlation with patient history and other diagnostic information is  necessary to determine patient infection status.  Positive results do  not rule out  bacterial infection or co-infection with other viruses. If result is PRESUMPTIVE POSTIVE SARS-CoV-2 nucleic acids MAY BE PRESENT.   A presumptive positive result was obtained on the submitted specimen  and confirmed on repeat testing.  While 2019 novel coronavirus  (SARS-CoV-2) nucleic acids may be present in the submitted sample  additional confirmatory testing may be necessary for epidemiological  and / or clinical management purposes  to differentiate between  SARS-CoV-2 and other Sarbecovirus currently known to infect humans.  If clinically indicated additional testing with an alternate test  methodology [phone]) is advised. The SARS-CoV-2 RNA is generally  detectable in upper and lower respiratory sp ecimens during the acute  phase of infection. The expected result is Negative. Fact Sheet for Patients:  BoilerBrush.com.cy Fact Sheet for Healthcare Providers: https://pope.com/ This test is not yet approved or cleared by the [place] FDA and has been authorized for detection and/or diagnosis of SARS-CoV-2 by FDA under an Emergency Use Authorization (EUA).  This EUA will remain in effect (meaning this test can be used) for the duration of the COVID-19 declaration under Section 564(b)(1) of the Act, 21 U.S.C. section 360bbb-3(b)(1), unless the authorization is terminated or revoked sooner. Performed at [facility], [address], [place], [place] [address]      Labs: BNP (last 3 results) No results for input(s): BNP in the last 8760 hours. Basic Metabolic Panel: Recent Labs  Lab 08/14/18 0355 08/15/18 0520 08/16/18 0321 08/17/18 0257  NA 138 138 138 139  K 4.5 4.2 3.9 4.0  CL 112* 111 111 112*  CO2 20* 23 21* 22  GLUCOSE 145* 120* 127* 102*  BUN 27* 23 22 22   CREATININE 1.29* 1.01* 0.97 0.87  CALCIUM 8.0* 8.0* 8.0* 8.1*   Liver Function Tests: No results for input(s): AST, ALT, ALKPHOS,  BILITOT, PROT, ALBUMIN in the last 168 hours. No results for input(s): LIPASE, AMYLASE in the last 168 hours. No results for input(s): AMMONIA in the last 168 hours. CBC: Recent Labs  Lab 08/14/18 0355 08/15/18 0520 08/16/18 0321 08/17/18 0257  WBC 7.3 6.1 4.7 6.1  HGB 7.7* 7.6* 7.6* 8.0*  HCT 26.2* 25.4* 26.3* 26.4*  MCV 94.6 93.4 93.6 92.0  PLT 289 264 289 288   Cardiac Enzymes: No results for input(s): CKTOTAL, CKMB, CKMBINDEX, TROPONINI in the last 168 hours. BNP: Invalid input(s): POCBNP CBG: Recent Labs  Lab 08/19/18 0747 08/19/18 1214 08/19/18 1725 08/19/18 2218 08/20/18 0749  GLUCAP 101* 126* 129* 129* 90   D-Dimer No results for input(s): DDIMER in the last 72 hours. Hgb A1c No results for input(s): HGBA1C in the last 72 hours. Lipid Profile No results for input(s): CHOL, HDL, LDLCALC, TRIG, CHOLHDL, LDLDIRECT in the last 72 hours. Thyroid function studies No results for input(s): TSH, T4TOTAL, T3FREE, THYROIDAB in the last 72 hours.  Invalid input(s): FREET3 Anemia work up No results for input(s): VITAMINB12, FOLATE, FERRITIN, TIBC, IRON, RETICCTPCT in the last 72 hours. Urinalysis    Component Value Date/Time   COLORURINE AMBER (A) 07/22/2018 0247   APPEARANCEUR CLOUDY (A) 07/22/2018 0247   LABSPEC 1.013 07/22/2018 0247   PHURINE 5.0 07/22/2018 0247   GLUCOSEU NEGATIVE 07/22/2018  [place] (A) 07/22/2018 0247   BILIRUBINUR NEGATIVE 07/22/2018 0247   KETONESUR 5 (A) 07/22/2018 0247   PROTEINUR 100 (A) 07/22/2018 0247   NITRITE NEGATIVE 07/22/2018 0247   LEUKOCYTESUR SMALL (A) 07/22/2018 0247   Sepsis Labs Invalid input(s): PROCALCITONIN,  WBC,  LACTICIDVEN Microbiology Recent Results (from the past 240 hour(s))  Novel Coronavirus, NAA (hospital order; send-out to ref lab)     Status: None   Collection Time: 08/15/18  1:08 PM   Specimen: Nasopharyngeal Swab; Respiratory  Result Value Ref Range Status   SARS-CoV-2, NAA NOT DETECTED NOT  DETECTED Final    Comment: (NOTE) This test was developed and its performance characteristics determined by [employer]. This test has not been FDA cleared or approved. This test has been authorized by FDA under an Emergency Use Authorization (EUA). This test is only authorized for the duration of time the declaration that circumstances exist justifying the authorization of the emergency use of in vitro diagnostic tests for detection of SARS-CoV-2 virus and/or diagnosis of COVID-19 infection under section 564(b)(1) of the Act, 21 U.S.C. 829HBZ-1(I)(9), unless the authorization is terminated or revoked sooner. When diagnostic testing is negative, the possibility of a false negative result should be considered in the context of a patient's recent exposures and the presence of clinical signs and symptoms consistent with COVID-19. An individual without symptoms of COVID-19 and who is not shedding SARS-CoV-2 virus would expect to have a negative (not detected) result in this assay. Performed  At: [facility] [place], [place] [id number] Rush Farmer MD BP:[id number]    [place]  Final    Comment: Performed at [facility] [address] [place],  [address]  SARS Coronavirus 2 (CEPHEID - Performed in [place] hospital lab), Hosp Order     Status: None   Collection Time: 08/19/18  2:45 PM   Specimen: Nasopharyngeal Swab  Result Value Ref Range Status   SARS Coronavirus 2 NEGATIVE NEGATIVE Final    Comment: (NOTE) If result is NEGATIVE SARS-CoV-2 target nucleic acids are NOT DETECTED. The SARS-CoV-2 RNA is generally detectable in upper and lower  respiratory specimens during the acute phase of infection. The lowest  concentration of SARS-CoV-2 viral copies this assay can detect is 250  copies / mL. A negative result does not preclude SARS-CoV-2 infection  and should not be used as the sole basis for  treatment or other  patient management decisions.  A negative result may occur with  improper specimen collection / handling, submission of specimen other  than nasopharyngeal swab, presence of viral mutation(s) within the  areas targeted by this assay, and inadequate number of viral copies  (<250 copies / mL). A negative result must be combined with clinical  observations, patient history, and epidemiological information. If result is POSITIVE SARS-CoV-2 target nucleic acids are DETECTED. The SARS-CoV-2 RNA is generally detectable in upper and lower  respiratory specimens dur ing the acute phase of infection.  Positive  results are indicative of active infection with SARS-CoV-2.  Clinical  correlation with patient history and other diagnostic information is  necessary to determine patient infection status.  Positive results do  not rule out bacterial infection or co-infection with other viruses. If result is PRESUMPTIVE POSTIVE SARS-CoV-2 nucleic acids MAY BE PRESENT.   A presumptive positive result was obtained on the submitted specimen  and confirmed on repeat testing.  While 2019 novel coronavirus  (SARS-CoV-2) nucleic acids may be  present in the submitted sample  additional confirmatory testing may be necessary for epidemiological  and / or clinical management purposes  to differentiate between  SARS-CoV-2 and other Sarbecovirus currently known to infect humans.  If clinically indicated additional testing with an alternate test  methodology [phone]) is advised. The SARS-CoV-2 RNA is generally  detectable in upper and lower respiratory sp ecimens during the acute  phase of infection. The expected result is Negative. Fact Sheet for Patients:  BoilerBrush.com.cy Fact Sheet for Healthcare Providers: https://pope.com/ This test is not yet approved or cleared by the [place] FDA and has been authorized for detection and/or  diagnosis of SARS-CoV-2 by FDA under an Emergency Use Authorization (EUA).  This EUA will remain in effect (meaning this test can be used) for the duration of the COVID-19 declaration under Section 564(b)(1) of the Act, 21 U.S.C. section 360bbb-3(b)(1), unless the authorization is terminated or revoked sooner. Performed at [facility], [address], [place], [place] [address]      Time coordinating discharge: 35 minutes  SIGNED:   Dorcas Carrow, MD  [facility] 08/20/2018, 10:28 AM Pager   If 7PM-7AM, please contact night-coverage [email or web address] Password [email or web address]

## 2018-08-21 ENCOUNTER — Other Ambulatory Visit: Payer: Self-pay | Admitting: Surgery

## 2018-08-21 DIAGNOSIS — R188 Other ascites: Secondary | ICD-10-CM

## 2018-08-28 ENCOUNTER — Other Ambulatory Visit: Payer: Medicare Other

## 2018-08-29 ENCOUNTER — Ambulatory Visit (HOSPITAL_COMMUNITY)
Admission: RE | Admit: 2018-08-29 | Discharge: 2018-08-29 | Disposition: A | Discharge status: Home / Self Care | Payer: Medicare Other | Source: Ambulatory Visit | Attending: Surgery | Admitting: Surgery

## 2018-08-29 ENCOUNTER — Ambulatory Visit (HOSPITAL_COMMUNITY): Payer: Medicare Other

## 2018-08-29 ENCOUNTER — Other Ambulatory Visit: Payer: Self-pay

## 2018-08-29 DIAGNOSIS — R188 Other ascites: Secondary | ICD-10-CM

## 2018-08-29 MED ORDER — IOHEXOL 300 MG/ML  SOLN
100.0000 mL | Freq: Once | INTRAMUSCULAR | Status: AC | PRN
  Administered 2018-08-29: 12:00:00 100 mL via INTRAVENOUS

## 2018-09-03 ENCOUNTER — Encounter: Payer: Self-pay | Admitting: General Surgery

## 2019-02-09 ENCOUNTER — Other Ambulatory Visit: Payer: Self-pay

## 2019-02-09 ENCOUNTER — Encounter: Payer: Self-pay | Admitting: Podiatry

## 2019-02-09 ENCOUNTER — Ambulatory Visit: Payer: Medicare Other | Admitting: Podiatry

## 2019-02-09 VITALS — BP 190/92 | HR 66 | Temp 98.3°F

## 2019-02-09 DIAGNOSIS — B351 Tinea unguium: Secondary | ICD-10-CM

## 2019-02-09 DIAGNOSIS — E0843 Diabetes mellitus due to underlying condition with diabetic autonomic (poly)neuropathy: Secondary | ICD-10-CM

## 2019-02-09 DIAGNOSIS — L600 Ingrowing nail: Secondary | ICD-10-CM

## 2019-02-09 DIAGNOSIS — M79676 Pain in unspecified toe(s): Secondary | ICD-10-CM

## 2019-02-09 MED ORDER — GENTAMICIN SULFATE 0.1 % EX CREA: 1.0000 "application " | TOPICAL_CREAM | Freq: Two times a day (BID) | CUTANEOUS | 1 refills | Status: DC

## 2019-02-09 NOTE — Patient Instructions (Signed)

## 2019-02-15 NOTE — Progress Notes (Signed)
   Subjective: Patient presents today for evaluation of pain to the lateral border of the right third toe that began about one month ago. Patient is concerned for possible ingrown nail. Touching the area and applying pressure to the toe increases the pain. She has not had any treatment for the symptoms.  She also complains of thickened, elongated nails 1-5 bilaterally that cause pain while ambulating in shoes. She is unable to trim her own nails. Patient presents today for further treatment and evaluation.  Past Medical History:  Diagnosis Date  . Depression   . Diverticulosis   . DM2 (diabetes mellitus, type 2) (HCC)   . Hiatal hernia   . 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 HTN (hypertension)   . Hyperlipidemia   . Hypothyroidism   . Imperforate anus   . Obesity   . OSA (obstructive sleep apnea)   . Palpitations   . Reflux esophagitis     Objective:  General: Well developed, nourished, in no acute distress, alert and oriented x3   Dermatology: Skin is warm, dry and supple bilateral. Lateral border of the right third toe appears to be erythematous with evidence of an ingrowing nail. Pain on palpation noted to the border of the nail fold. Nails are tender, long, thickened and dystrophic with subungual debris, consistent with onychomycosis, 1-5 bilateral. No signs of infection noted.  Vascular: Dorsalis Pedis artery and Posterior Tibial artery pedal pulses palpable. No lower extremity edema noted.   Neruologic: Grossly intact via light touch bilateral.  Musculoskeletal: Muscular strength within normal limits in all groups bilateral. Normal range of motion noted to all pedal and ankle joints.   Assesement: #1 Paronychia with ingrowing nail lateral border right third toe #2 Pain in toe #3 Incurvated nail #4 Diabetes Mellitus w/ peripheral neuropathy #5 Onychomycosis of nail due to dermatophyte  bilateral  Plan of Care:  1. Patient evaluated.  2. Discussed treatment alternatives and plan of care. Explained nail avulsion procedure and post procedure course to patient. 3. Patient opted for permanent partial nail avulsion of the lateral border of the right third toe.  4. Prior to procedure, local anesthesia infiltration utilized using 3 ml of a 50:50 mixture of 2% plain lidocaine and 0.5% plain marcaine in a normal digital block fashion and a betadine prep performed.  5. Partial permanent nail avulsion with chemical matrixectomy performed using 3x30sec applications of phenol followed by alcohol flush.  6. Light dressing applied. 7. Prescription for Gentamicin cream provided to patient to use daily with a bandage.  8. Mechanical debridement of nails 1-5 bilaterally performed using a nail nipper. Filed with dremel without incident.  9. Return to clinic in 3 weeks.  Felecia Shelling, DPM [facility]  Dr. Felecia Shelling, DPM    [address]                                        [place], [place] [address]                Office [phone]  Fax [phone]

## 2019-02-23 ENCOUNTER — Ambulatory Visit: Payer: Medicare PPO | Admitting: Podiatry

## 2019-02-23 ENCOUNTER — Ambulatory Visit: Payer: Medicare Other | Admitting: Podiatry

## 2019-02-23 ENCOUNTER — Other Ambulatory Visit: Payer: Self-pay

## 2019-02-23 DIAGNOSIS — E0843 Diabetes mellitus due to underlying condition with diabetic autonomic (poly)neuropathy: Secondary | ICD-10-CM | POA: Diagnosis not present

## 2019-02-23 DIAGNOSIS — L03031 Cellulitis of right toe: Secondary | ICD-10-CM

## 2019-02-23 DIAGNOSIS — L600 Ingrowing nail: Secondary | ICD-10-CM

## 2019-02-23 MED ORDER — DOXYCYCLINE HYCLATE 100 MG PO TABS: 100.0000 mg | ORAL_TABLET | Freq: Two times a day (BID) | ORAL | 0 refills | Status: AC

## 2019-02-26 NOTE — Progress Notes (Signed)
Subjective: Patient presents today for follow up evaluation of a permanent partial nail avulsion procedure of the lateral border of the right third toe that was performed on 02/09/2019.  She reports some continued tenderness of the toe. She reports associated redness and swelling. She has been using the Gentamicin cream as directed.  She is also here for evaluation of pain to the lateral border of the right great toe that began a few weeks ago. Patient is concerned for possible ingrown nail. Applying pressure to the toe increases the pain. She has not had any treatment for the pain. Patient presents today for further treatment and evaluation.  Past Medical History:  Diagnosis Date  . Depression   . Diverticulosis   . DM2 (diabetes mellitus, type 2) (HCC)   . Hiatal hernia   . 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 HTN (hypertension)   . Hyperlipidemia   . Hypothyroidism   . Imperforate anus   . Obesity   . OSA (obstructive sleep apnea)   . Palpitations   . Reflux esophagitis     Objective:  General: Well developed, nourished, in no acute distress, alert and oriented x3   Dermatology: Skin is warm, dry and supple bilateral. Lateral border of the right great toe appears to be erythematous with evidence of an ingrowing nail. Pain on palpation noted to the border of the nail fold. Nail and respective nail fold appears to be healing appropriately. Open wound to the associated nail fold with a granular wound base and moderate amount of fibrotic tissue. Minimal drainage noted. Mild erythema around the periungual region likely due to phenol chemical matricectomy.  Vascular: Dorsalis Pedis artery and Posterior Tibial artery pedal pulses palpable. No lower extremity edema noted.   Neruologic: Grossly intact via light touch bilateral.  Musculoskeletal: Muscular strength within normal limits in all groups bilateral. Normal  range of motion noted to all pedal and ankle joints.   Assesement: #1 Paronychia with ingrowing nail lateral border right great toe #2 Pain in toe #3 Incurvated nail #4 postop permanent partial nail avulsion lateral border right third toe  #5 open wound periungual nail fold of respective digit.   Plan of Care:  1. Patient evaluated.  2. Discussed treatment alternatives and plan of care. Explained nail avulsion procedure and post procedure course to patient. 3. Patient opted for permanent partial nail avulsion of the lateral border right great toe.  4. Prior to procedure, local anesthesia infiltration utilized using 3 ml of a 50:50 mixture of 2% plain lidocaine and 0.5% plain marcaine in a normal hallux block fashion and a betadine prep performed.  5. Partial permanent nail avulsion with chemical matrixectomy performed using 3x30sec applications of phenol followed by alcohol flush.  6. Light dressing applied. 7. Continue using Gentamicin cream.  8. Prescription for Doxycycline 100 mg provided to patient.  9. Return to clinic in 3 weeks.  Felecia Shelling, DPM [facility]  Dr. Felecia Shelling, DPM    [address]                                        [place], [place] [address]                Office [phone]  Fax [phone]

## 2019-03-16 ENCOUNTER — Ambulatory Visit: Payer: Medicare PPO | Admitting: Podiatry

## 2019-11-16 ENCOUNTER — Ambulatory Visit (INDEPENDENT_AMBULATORY_CARE_PROVIDER_SITE_OTHER): Payer: Medicare PPO | Admitting: Podiatry

## 2019-11-16 ENCOUNTER — Other Ambulatory Visit: Payer: Self-pay

## 2019-11-16 DIAGNOSIS — B351 Tinea unguium: Secondary | ICD-10-CM

## 2019-11-16 DIAGNOSIS — M79675 Pain in left toe(s): Secondary | ICD-10-CM | POA: Diagnosis not present

## 2019-11-16 DIAGNOSIS — M79674 Pain in right toe(s): Secondary | ICD-10-CM

## 2019-11-24 NOTE — Progress Notes (Signed)
   SUBJECTIVE Patient presents to office today complaining of elongated, thickened nails that cause pain while ambulating in shoes.  She is unable to trim her own nails. Patient is here for further evaluation and treatment.  Past Medical History:  Diagnosis Date  . Depression   . Diverticulosis   . DM2 (diabetes mellitus, type 2) (HCC)   . Hiatal hernia   . 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 HTN (hypertension)   . Hyperlipidemia   . Hypothyroidism   . Imperforate anus   . Obesity   . OSA (obstructive sleep apnea)   . Palpitations   . Reflux esophagitis     OBJECTIVE General Patient is awake, alert, and oriented x 3 and in no acute distress. Derm Skin is dry and supple bilateral. Negative open lesions or macerations. Remaining integument unremarkable. Nails are tender, long, thickened and dystrophic with subungual debris, consistent with onychomycosis, 1-5 bilateral. No signs of infection noted. Vasc  DP and PT pedal pulses palpable bilaterally. Temperature gradient within normal limits.  Neuro Epicritic and protective threshold sensation grossly intact bilaterally.  Musculoskeletal Exam No symptomatic pedal deformities noted bilateral. Muscular strength within normal limits.  ASSESSMENT 1.  Pain due to onychomycosis of toenails both feet 2.  History of partial nail matricectomy's lateral border right great toe.  Lateral border right third toe.  PLAN OF CARE 1. Patient evaluated today.  2. Instructed to maintain good pedal hygiene and foot care.  3. Mechanical debridement of nails 1-5 bilaterally performed using a nail nipper. Filed with dremel without incident.  4. Return to clinic in 3 mos.    Felecia Shelling, DPM [facility]  Dr. Felecia Shelling, DPM    [address]                                        [place], [place] [address]                Office [phone]  Fax [phone]

## 2020-02-09 ENCOUNTER — Other Ambulatory Visit: Payer: Self-pay | Admitting: Family Medicine

## 2020-02-09 DIAGNOSIS — Z1231 Encounter for screening mammogram for malignant neoplasm of breast: Secondary | ICD-10-CM

## 2020-02-09 DIAGNOSIS — Z780 Asymptomatic menopausal state: Secondary | ICD-10-CM

## 2020-05-03 IMAGING — CT CT HEAD WITHOUT CONTRAST
3 series · 15 of 47 positions shown, 18 images · non-contrast
Comparison: None.

CLINICAL DATA: Fever.  Acute mental status changes.

EXAM:
CT HEAD WITHOUT CONTRAST
TECHNIQUE: Contiguous axial images were obtained from the base of the skull
through the vertex without intravenous contrast.

[Series 2: head wo · axial · 0.47mm/px · z∈[-111,+19]mm · 9 of 32 slices shown, 12 images]
[im 3/32  brain]
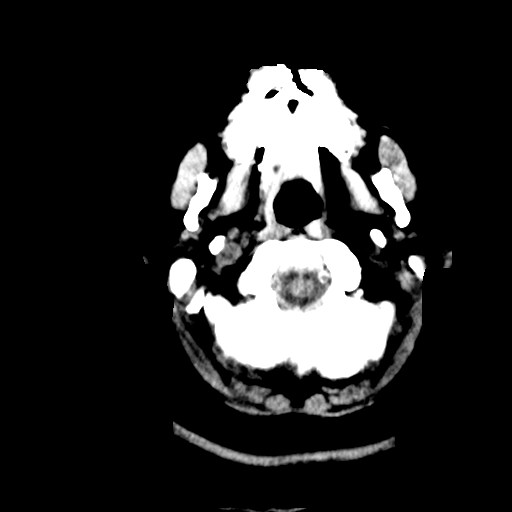
[im 3/32  bone]
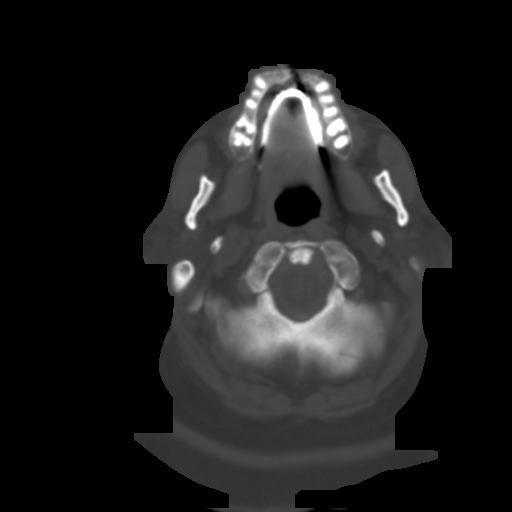
[im 6/32  brain]
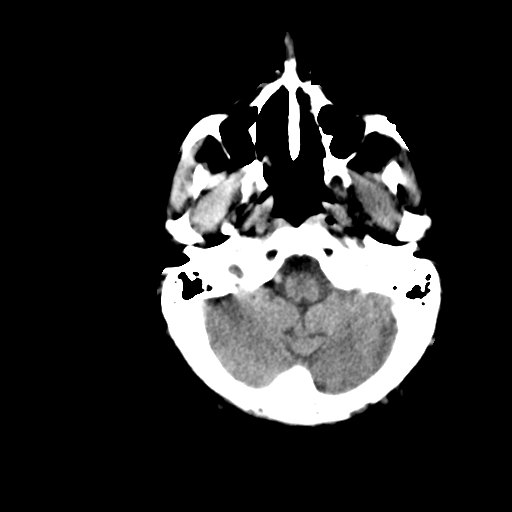
[im 9/32  brain]
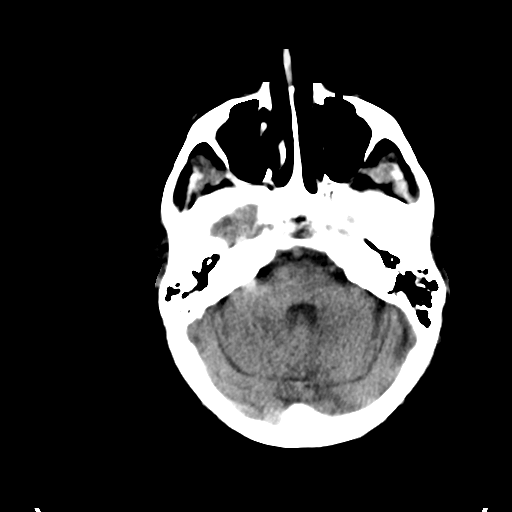
[im 12/32  brain]
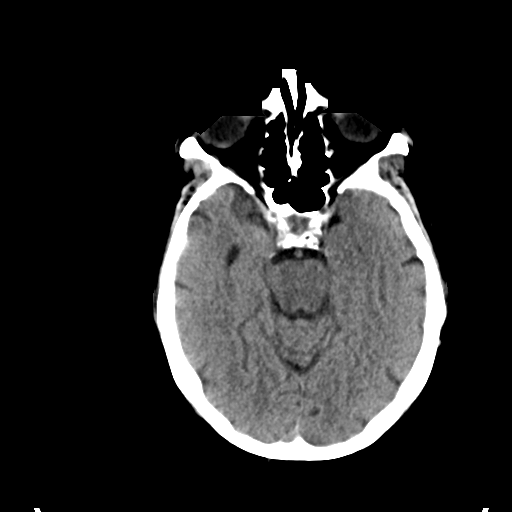
[im 17/32  brain]
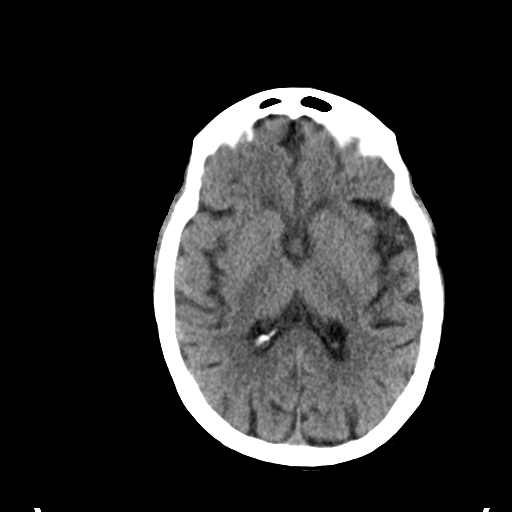
[im 17/32  bone]
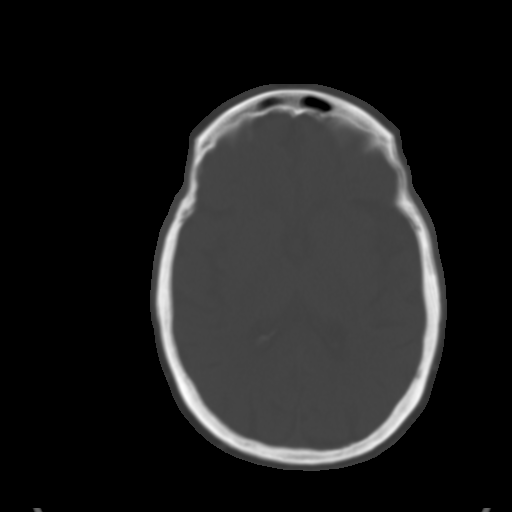
[im 20/32  brain]
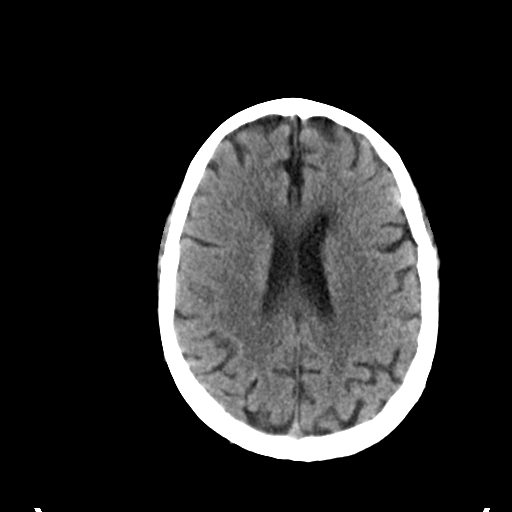
[im 23/32  brain]
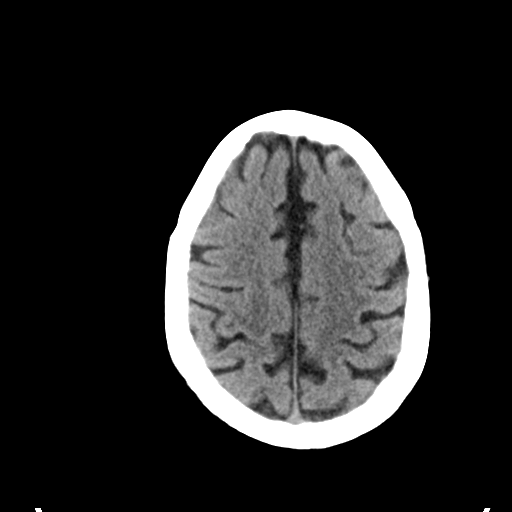
[im 26/32  brain]
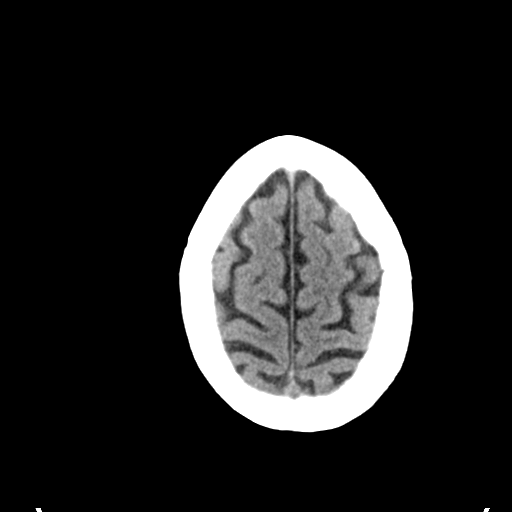
[im 29/32  brain]
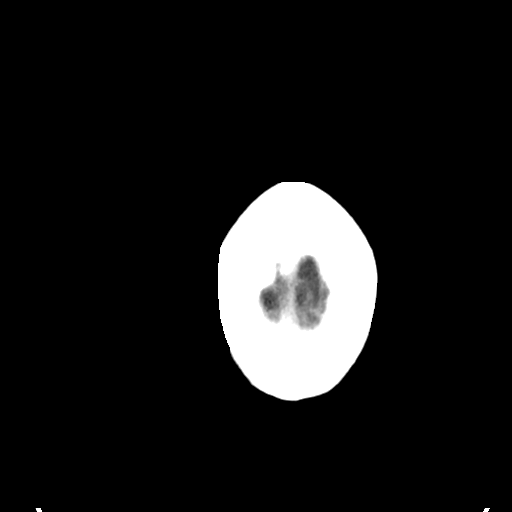
[im 29/32  bone]
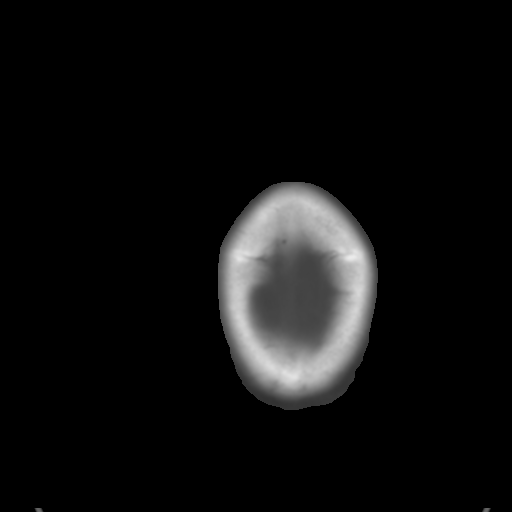

[Series 4: coronal soft tissue · coronal · 0.26mm/px · 3 of 62 slices shown]
[im 21/62  brain]
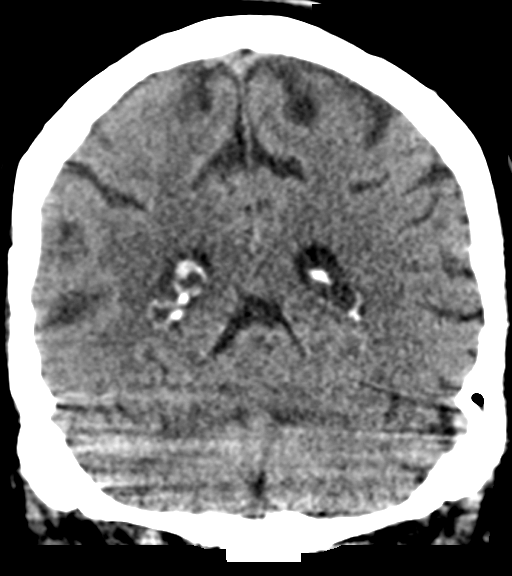
[im 28/62  brain]
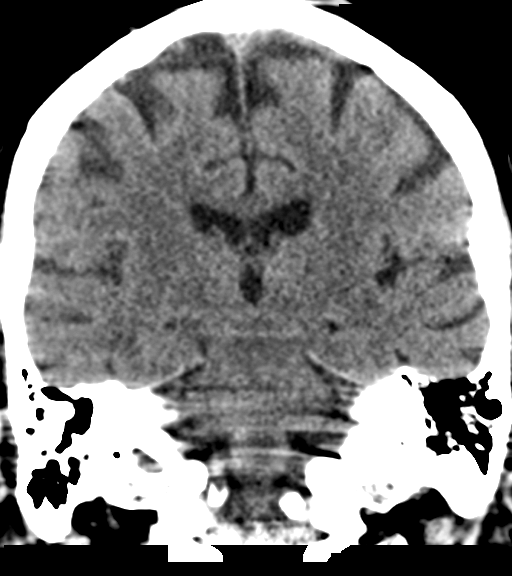
[im 34/62  brain]
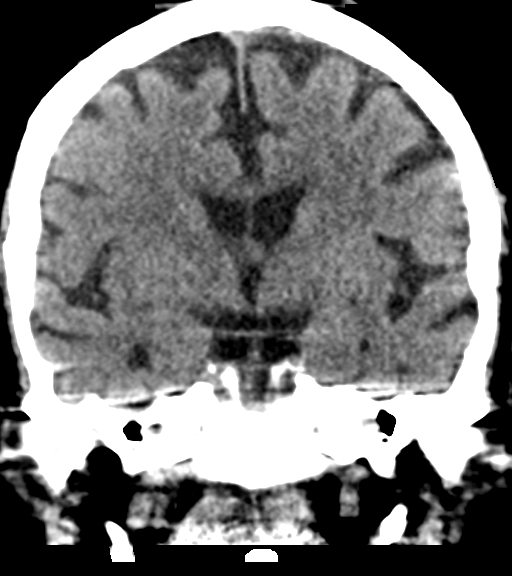

[Series 5: sagittal soft tissue · sagittal · 0.30mm/px · 3 of 47 slices shown]
[im 16/47  brain]
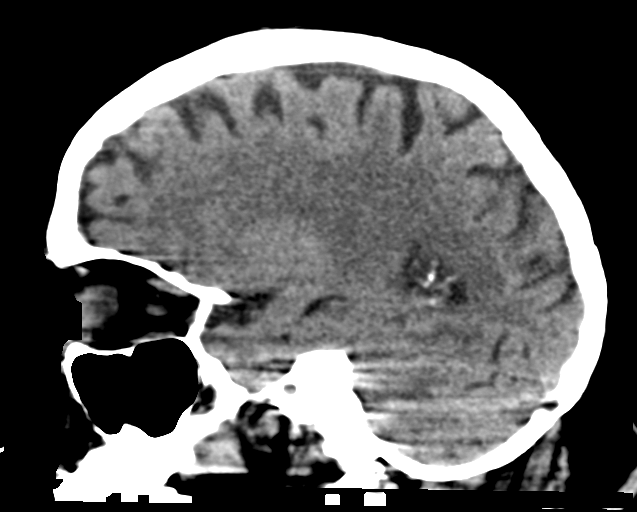
[im 24/47  brain]
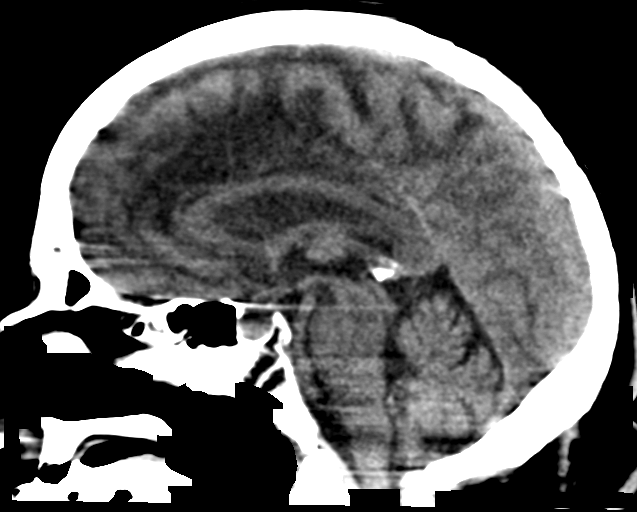
[im 31/47  brain]
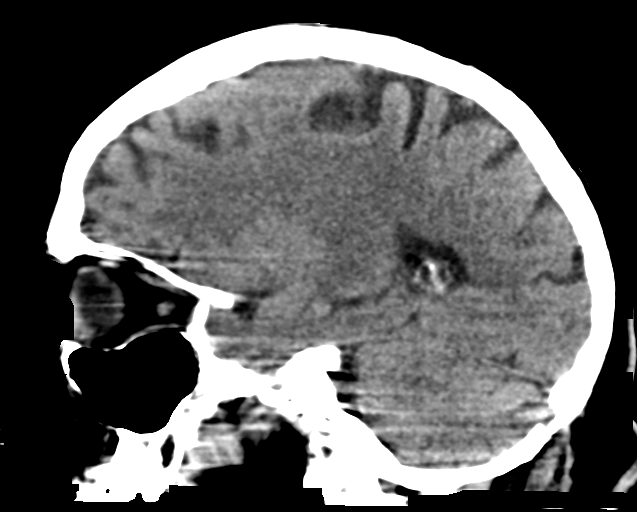

[15 of 47 positions shown; findings below may reference images not displayed]

FINDINGS: Brain: Mild atrophy and white matter changes are within normal
limits for age. No acute infarct, hemorrhage, or mass lesion is
present. The ventricles are of normal size. No significant
extraaxial fluid collection is present.

Vascular: Atherosclerotic changes are noted in the cavernous
internal carotid arteries and at the dural margin of both vertebral
arteries. There is no hyperdense vessel.

Skull: Calvarium is intact. No focal lytic or blastic lesions are
present. No significant extra cranial soft tissue lesions are
present.

Sinuses/Orbits: The paranasal sinuses and mastoid air cells are
clear. The globes and orbits are within normal limits.
IMPRESSION: 1. Normal CT appearance of the brain for age.
2. Diffuse atherosclerotic calcifications.

## 2020-05-03 IMAGING — CT CT ABDOMEN AND PELVIS WITHOUT CONTRAST
2 of 4 series · 16 of 46 positions shown, 18 images · non-contrast
Comparison: CT 07/22/2018

CLINICAL DATA: Colitis. Septic shock. Colectomy for ischemic
colitis. Liver pressure.

EXAM:
CT ABDOMEN AND PELVIS WITHOUT CONTRAST
TECHNIQUE: Multidetector CT imaging of the abdomen and pelvis was performed
following the standard protocol without IV contrast.

[Series 2: axial st · axial · 0.98mm/px · z∈[-322,+54]mm · 13 of 85 slices shown, 15 images]
[im 5/85  soft-tissue]
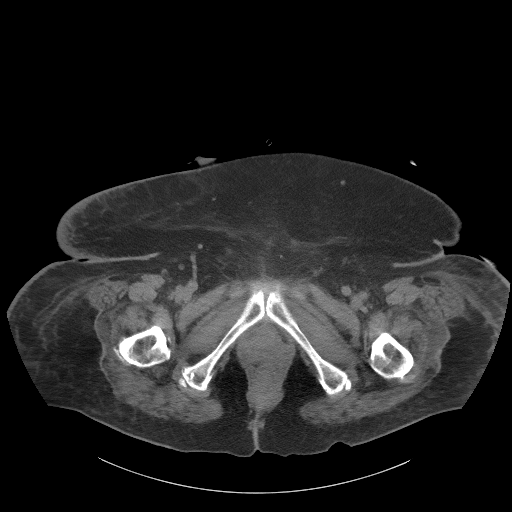
[im 5/85  bone]
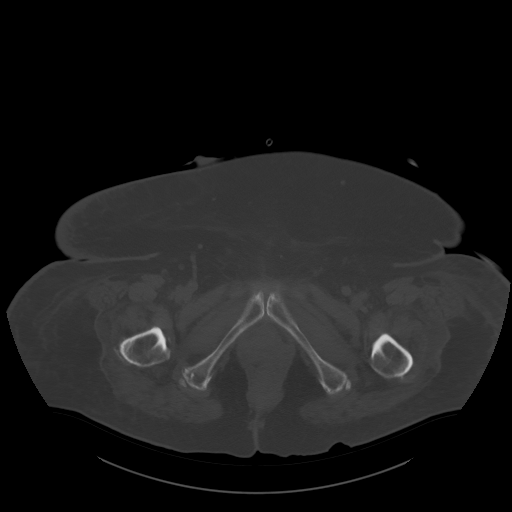
[im 14/85  soft-tissue]
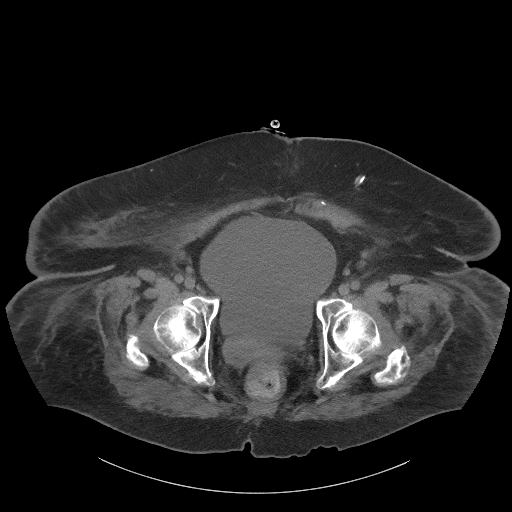
[im 18/85  soft-tissue]
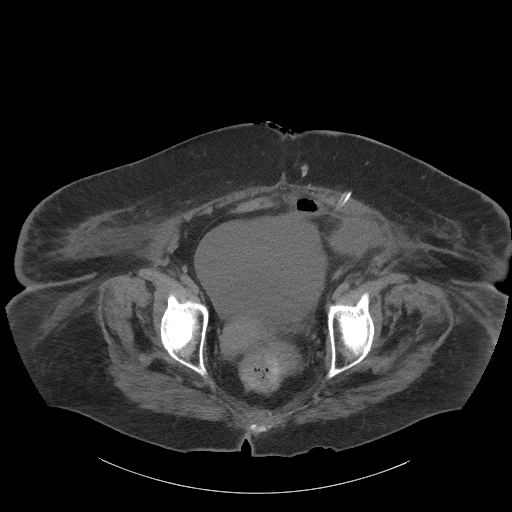
[im 23/85  soft-tissue]
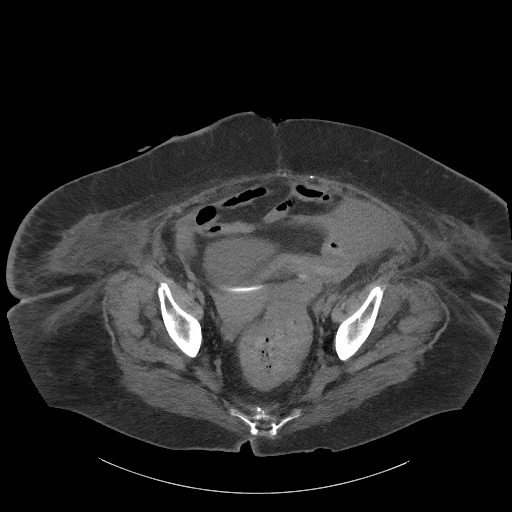
[im 31/85  soft-tissue]
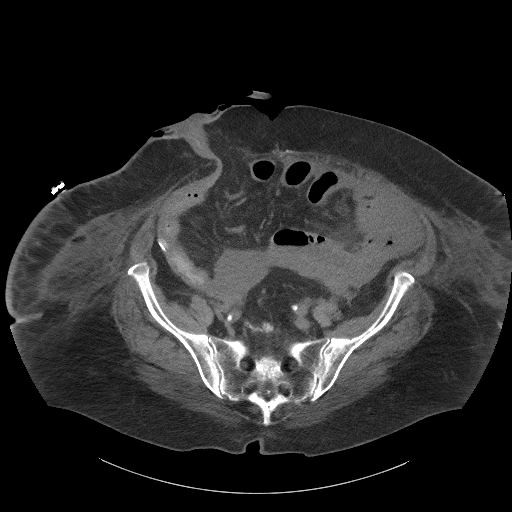
[im 36/85  soft-tissue]
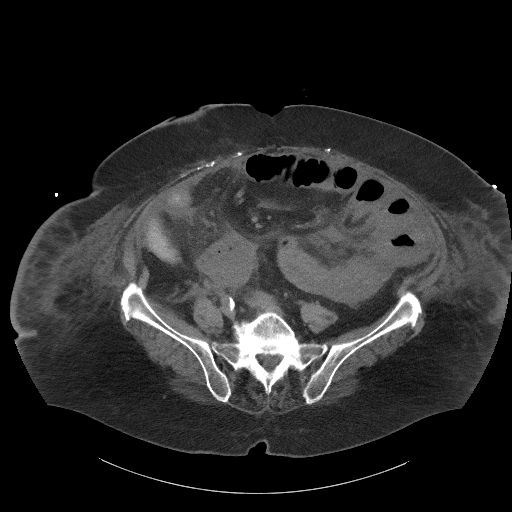
[im 45/85  soft-tissue]
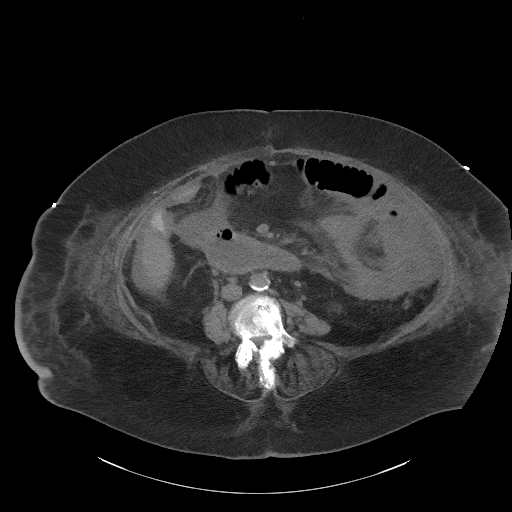
[im 49/85  soft-tissue]
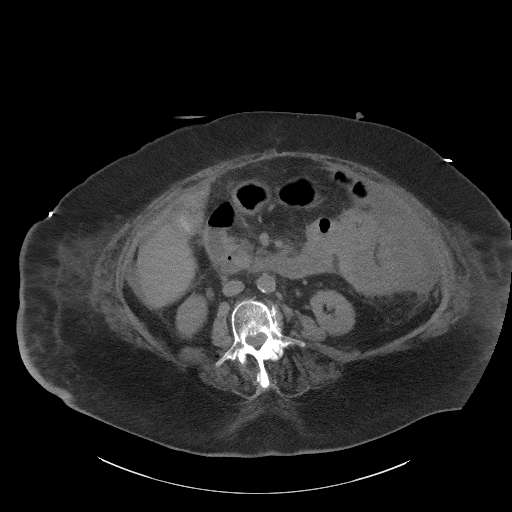
[im 54/85  soft-tissue]
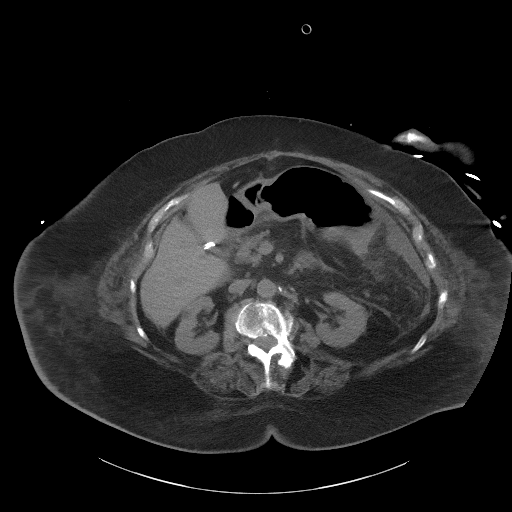
[im 54/85  bone]
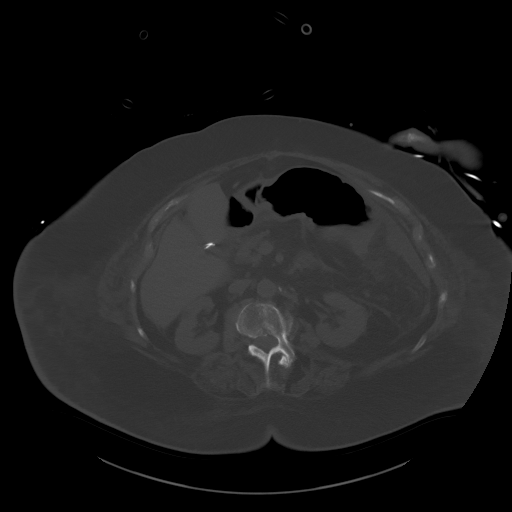
[im 62/85  soft-tissue]
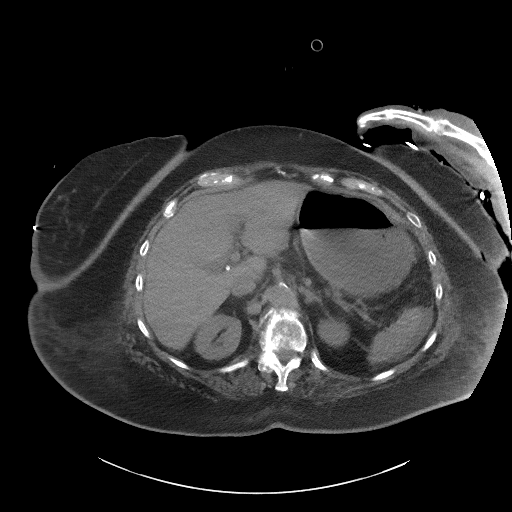
[im 67/85  soft-tissue]
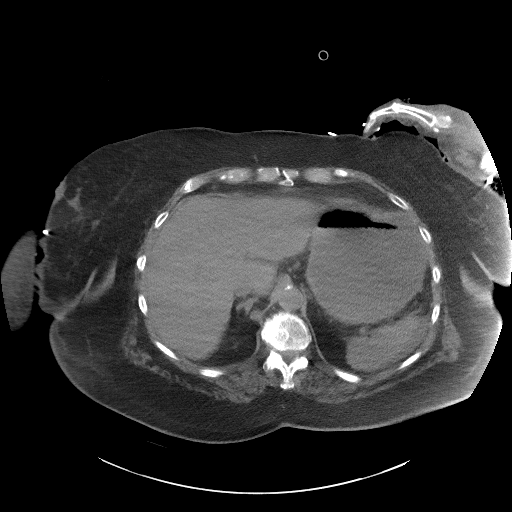
[im 71/85  soft-tissue]
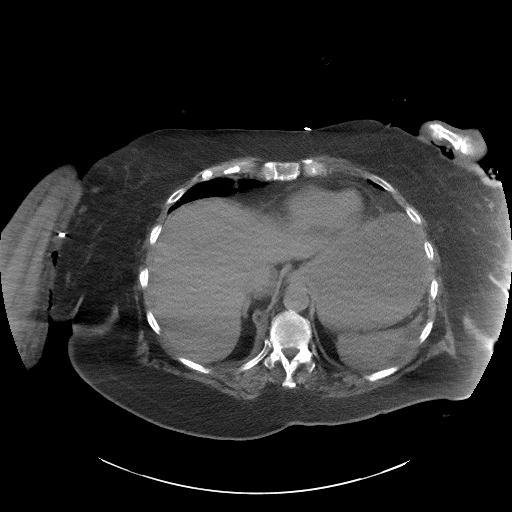
[im 80/85  soft-tissue]
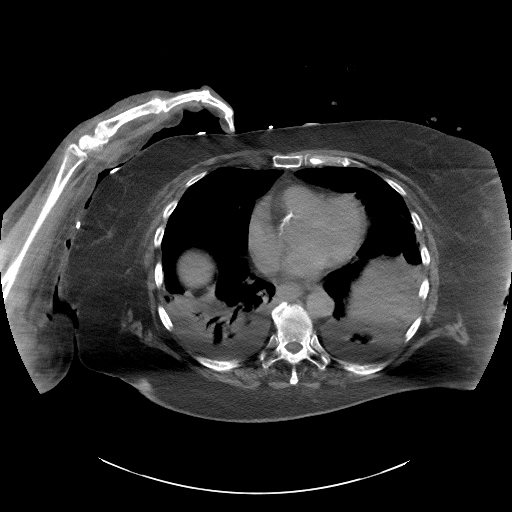

[Series 5: coronal st · coronal · 0.86mm/px · 3 of 113 slices shown]
[im 38/113  soft-tissue]
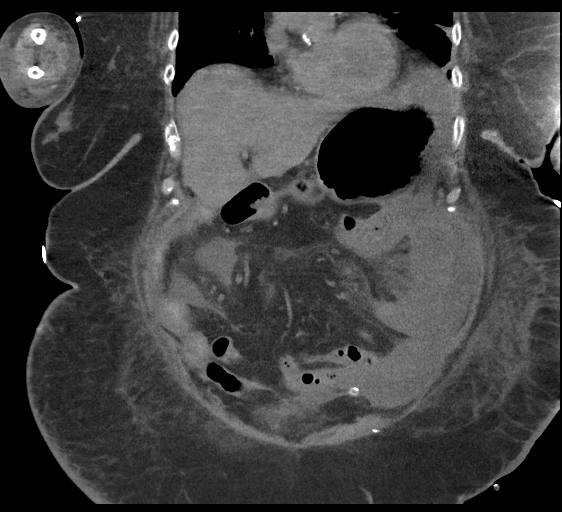
[im 50/113  soft-tissue]
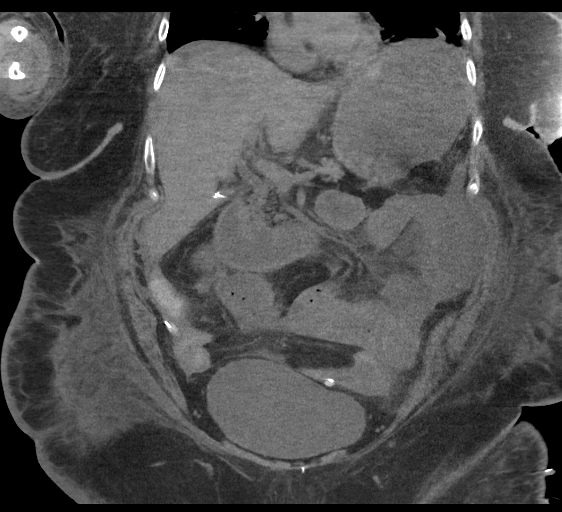
[im 63/113  soft-tissue]
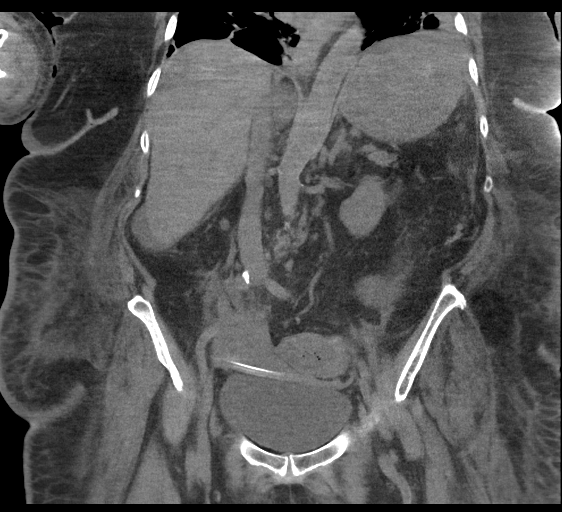

[16 of 46 positions shown; findings below may reference images not displayed]

FINDINGS: Lower chest: Dense consolidation in the LEFT lower lobe with air
bronchograms. Mild consolidation in LEFT lower lobe. Bilateral small
effusions.

Hepatobiliary: No focal hepatic lesion. Postcholecystectomy. Non IV
contrast exam.

Pancreas: Pancreas is normal. No ductal dilatation. No pancreatic
inflammation.

Spleen: Normal spleen

Adrenals/urinary tract: Adrenal glands normal. No renal obstruction.
Incidental note of angiomyolipoma of the LEFT kidney. Bladder is
mildly distended.

Stomach/Bowel: Stomach is fluid-filled. The duodenum is normal.
There is mild dilatation of the small bowel up to 3.2 cm. Loop of
small bowel in the LEFT abdomen is edematous with bowel wall
measuring up to 20 mm (image 45/5 and 51/2). No evidence of
high-grade obstruction. No pneumatosis or intraperitoneal free air
identified. RIGHT lower quadrant end ileostomy noted.

There is a fluid within the mesentery associated with the small
bowel. One collection of fluid in the RIGHT upper quadrant measures
5.0 by 4.3 cm (image 44/2). This collection is minimal organization.

There is a percutaneous drain in the lateral RIGHT abdomen
paralleling the distal ileum.

Vascular/Lymphatic: Abdominal aorta is normal caliber with
atherosclerotic calcification. There is no retroperitoneal or
periportal lymphadenopathy. No pelvic lymphadenopathy.

Reproductive: Uterus and adnexa grossly normal

Other: No intraperitoneal free air.

Musculoskeletal: No aggressive osseous lesion.
IMPRESSION: 1. Dense consolidation RIGHT lower lobe is concerning for pneumonia.
2. No evidence of bowel obstruction. Edematous small bowel in the
LEFT abdomen. No pneumatosis.
3. Mild intraabdominal free fluid. One pocket of fluid in the RIGHT
upper quadrant with minimal organization measures 5 cm.
4. Percutaneous drain in the RIGHT abdomen.
5. Post colectomy and end ileostomy.

## 2020-05-03 IMAGING — DX ABDOMEN - 1 VIEW
1 series · 1 of 1 positions shown · non-contrast
Comparison: 07/29/2018 CT abdomen and pelvis and abdomen
radiographs.

CLINICAL DATA: 78 y/o  F; shortness of breath.

EXAM:
ABDOMEN - 1 VIEW

[abdomen kub]
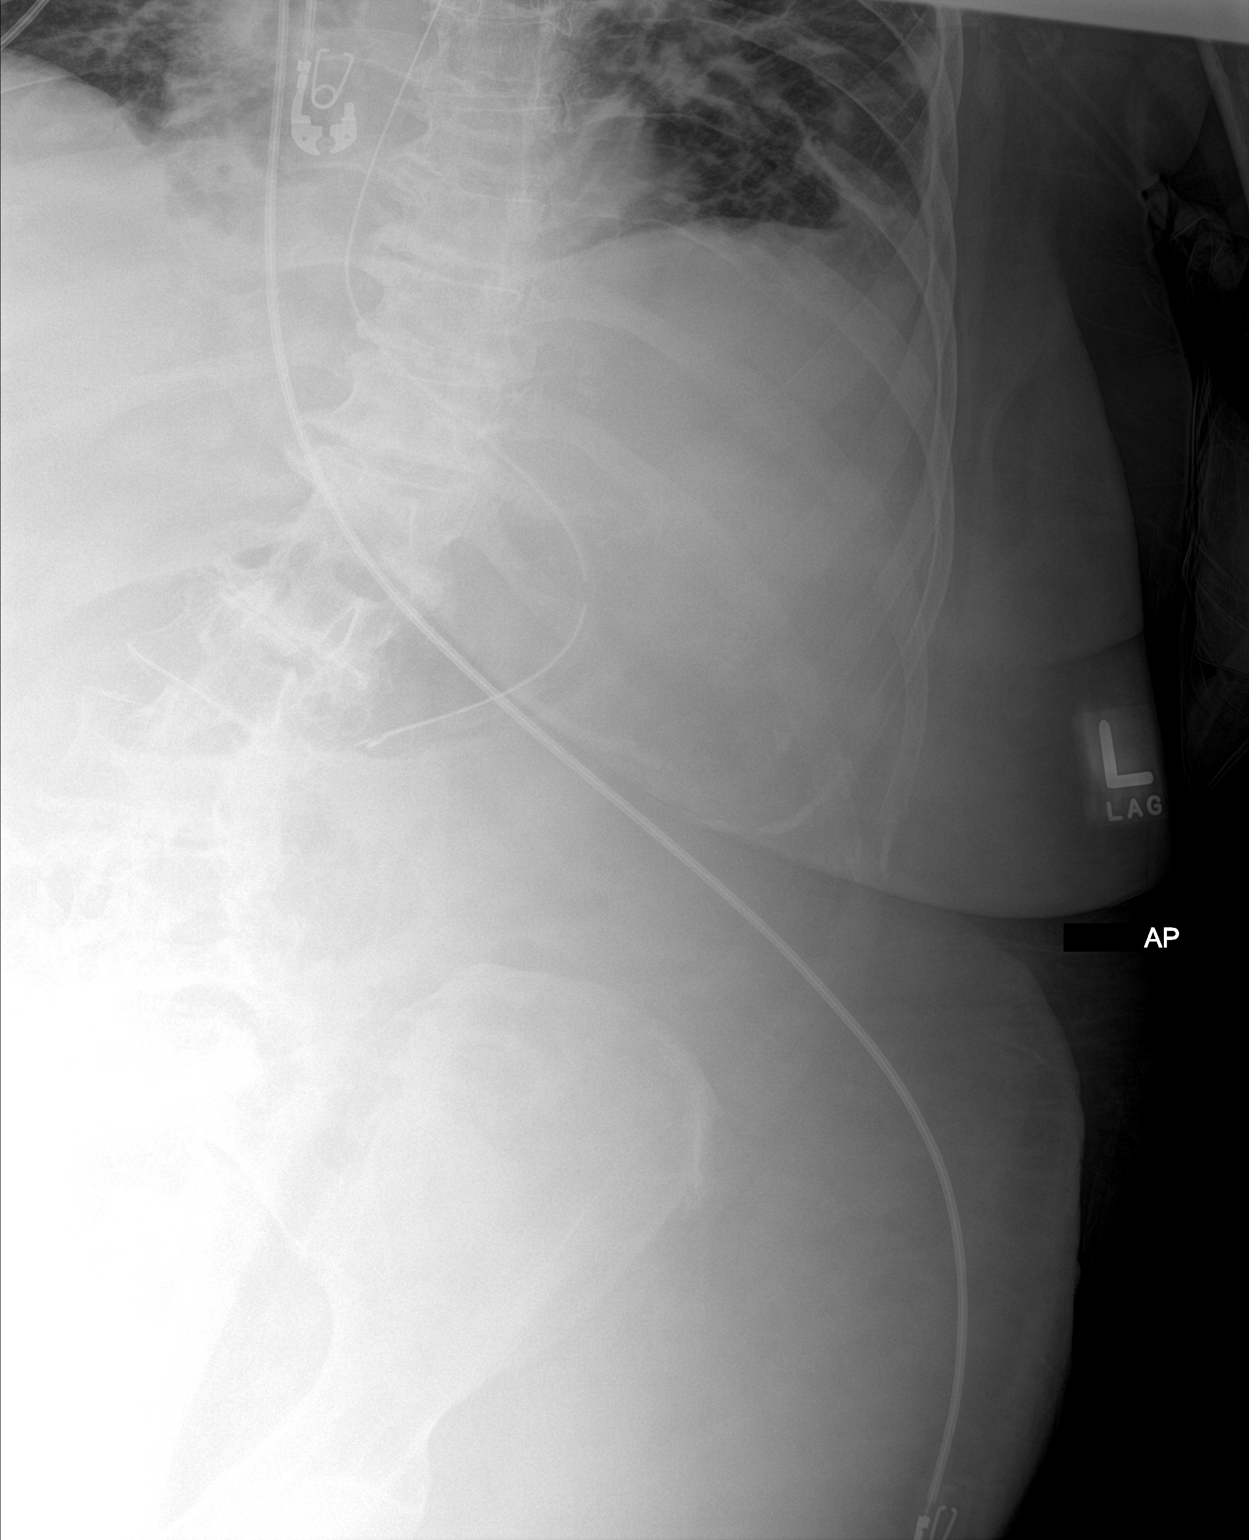

[1 of 1 positions shown; findings below may reference images not displayed]

FINDINGS: Enteric tube tip projects over the gastric body. Advanced rotatory
dextrocurvature of lumbar spine. Normal included bowel gas pattern,
the right abdomen is excluded.
IMPRESSION: Enteric tube tip projects over the gastric body.

## 2020-05-23 ENCOUNTER — Inpatient Hospital Stay: Admission: RE | Admit: 2020-05-23 | Payer: Medicare Other | Source: Ambulatory Visit

## 2020-05-23 ENCOUNTER — Other Ambulatory Visit: Payer: Medicare Other

## 2020-09-12 ENCOUNTER — Other Ambulatory Visit: Payer: Self-pay

## 2020-09-12 ENCOUNTER — Emergency Department (HOSPITAL_BASED_OUTPATIENT_CLINIC_OR_DEPARTMENT_OTHER): Payer: Medicare PPO

## 2020-09-12 ENCOUNTER — Emergency Department (HOSPITAL_BASED_OUTPATIENT_CLINIC_OR_DEPARTMENT_OTHER)
Admission: EM | Admit: 2020-09-12 | Discharge: 2020-09-13 | Disposition: A | Discharge status: Home / Self Care | Payer: Medicare PPO | Attending: Emergency Medicine | Admitting: Emergency Medicine

## 2020-09-12 ENCOUNTER — Encounter (HOSPITAL_BASED_OUTPATIENT_CLINIC_OR_DEPARTMENT_OTHER): Payer: Self-pay | Admitting: *Deleted

## 2020-09-12 DIAGNOSIS — E785 Hyperlipidemia, unspecified: Secondary | ICD-10-CM | POA: Diagnosis not present

## 2020-09-12 DIAGNOSIS — Z794 Long term (current) use of insulin: Secondary | ICD-10-CM | POA: Insufficient documentation

## 2020-09-12 DIAGNOSIS — R2231 Localized swelling, mass and lump, right upper limb: Secondary | ICD-10-CM | POA: Diagnosis not present

## 2020-09-12 DIAGNOSIS — S01111A Laceration without foreign body of right eyelid and periocular area, initial encounter: Secondary | ICD-10-CM | POA: Insufficient documentation

## 2020-09-12 DIAGNOSIS — E1169 Type 2 diabetes mellitus with other specified complication: Secondary | ICD-10-CM | POA: Diagnosis not present

## 2020-09-12 DIAGNOSIS — W108XXA Fall (on) (from) other stairs and steps, initial encounter: Secondary | ICD-10-CM | POA: Diagnosis not present

## 2020-09-12 DIAGNOSIS — E039 Hypothyroidism, unspecified: Secondary | ICD-10-CM | POA: Insufficient documentation

## 2020-09-12 DIAGNOSIS — S8002XA Contusion of left knee, initial encounter: Secondary | ICD-10-CM | POA: Diagnosis not present

## 2020-09-12 DIAGNOSIS — N183 Chronic kidney disease, stage 3 unspecified: Secondary | ICD-10-CM | POA: Insufficient documentation

## 2020-09-12 DIAGNOSIS — S0990XA Unspecified injury of head, initial encounter: Secondary | ICD-10-CM | POA: Diagnosis present

## 2020-09-12 DIAGNOSIS — Z7984 Long term (current) use of oral hypoglycemic drugs: Secondary | ICD-10-CM | POA: Diagnosis not present

## 2020-09-12 DIAGNOSIS — E1122 Type 2 diabetes mellitus with diabetic chronic kidney disease: Secondary | ICD-10-CM | POA: Diagnosis not present

## 2020-09-12 DIAGNOSIS — W19XXXA Unspecified fall, initial encounter: Secondary | ICD-10-CM

## 2020-09-12 DIAGNOSIS — Z23 Encounter for immunization: Secondary | ICD-10-CM | POA: Diagnosis not present

## 2020-09-12 DIAGNOSIS — I129 Hypertensive chronic kidney disease with stage 1 through stage 4 chronic kidney disease, or unspecified chronic kidney disease: Secondary | ICD-10-CM | POA: Insufficient documentation

## 2020-09-12 DIAGNOSIS — Y92009 Unspecified place in unspecified non-institutional (private) residence as the place of occurrence of the external cause: Secondary | ICD-10-CM | POA: Insufficient documentation

## 2020-09-12 DIAGNOSIS — S0181XA Laceration without foreign body of other part of head, initial encounter: Secondary | ICD-10-CM | POA: Insufficient documentation

## 2020-09-12 DIAGNOSIS — Z79899 Other long term (current) drug therapy: Secondary | ICD-10-CM | POA: Insufficient documentation

## 2020-09-12 DIAGNOSIS — T148XXA Other injury of unspecified body region, initial encounter: Secondary | ICD-10-CM

## 2020-09-12 MED ORDER — LIDOCAINE HCL (PF) 1 % IJ SOLN
5.0000 mL | Freq: Once | INTRAMUSCULAR | Status: AC
  Administered 2020-09-12: 5 mL
  Filled 2020-09-12: qty 5

## 2020-09-12 MED ORDER — TETANUS-DIPHTH-ACELL PERTUSSIS 5-2.5-18.5 LF-MCG/0.5 IM SUSY
0.5000 mL | PREFILLED_SYRINGE | Freq: Once | INTRAMUSCULAR | Status: AC
  Administered 2020-09-12: 0.5 mL via INTRAMUSCULAR
  Filled 2020-09-12: qty 0.5

## 2020-09-12 NOTE — ED Triage Notes (Signed)
Pt brought in by EMS from home  Pt fell on her porch, states she tripped on a step and fell  Denies LOC  Pt has a laceration to her right eyebrow and a skin tear on her right cheek  No other injuries to note  Pt is alert and oriented x 3  Dressing applied by EMS to laceration  Pt is not on any blood thinners

## 2020-09-12 NOTE — Discharge Instructions (Signed)
Tetanus updated.  Keep the abrasions and skin tears and laceration dry for 24 hours.  Then can wash with soap and water and apply new antibiotic ointment.  Suture removal in 5 to 7 days.

## 2020-09-12 NOTE — ED Provider Notes (Addendum)
[facility] EMERGENCY DEPARTMENT Provider Note   CSN: [id number] Arrival date & time: 09/12/20  1933     History Chief Complaint  Patient presents with   Hailey Hughes is a 80 y.o. female.  Patient fell outside at about 1815.  Landed on her face.  Patient with a laceration to the right eyebrow.  And a skin tear to her right cheek area.  Also some abrasions to her right forehead.  Patient has swelling to proximal knuckle of her right ring finger.  And has some swelling and abrasions to the left knee.  Patient denies any loss of consciousness.  Patient's not sure if her tetanus is up-to-date.  Past medical history significant for hyperlipidemia hypothyroidism hypertension and diabetes.      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   Qualifier: Diagnosis of  By: Nelson-Smith CMA (AAMA), Dottie     HTN (hypertension)    Hyperlipidemia    Hypothyroidism    Imperforate anus    Obesity    OSA (obstructive sleep apnea)    Palpitations    Reflux esophagitis     Patient Active Problem List   Diagnosis Date Noted   Abdominal fluid collection 08/08/2018   Hyperphosphatemia 08/07/2018   Moderate protein malnutrition (HCC) 08/06/2018   Pressure injury of skin 08/05/2018   Acute respiratory failure (HCC)    Sepsis (HCC)    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 07/22/2018   Hypotension    Elevated lactic acid level    CKD (chronic kidney disease) stage 3, GFR 30-59 ml/min (HCC) 12/30/2017   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   Anxiety 04/07/2015   Diabetes (HCC) 04/07/2015   Hyperlipidemia 04/07/2015   DYSPEPSIA 05/26/2008   Epigastric pain 05/26/2008   Hypothyroidism 05/21/2008   DIVERTICULOSIS, COLON 05/21/2008   Constipation 05/21/2008   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   FIBROSITIS 05/21/2008   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     Family History  Problem Relation Age of Onset   Heart attack Father    Hypertension Father    Hypertension Mother    Diabetes Sister    Colon cancer Maternal Uncle    Heart disease Brother    Colon polyps Sister    Esophageal cancer Neg Hx    Kidney disease Neg Hx     Social History   Tobacco Use   Smoking status: Never   Smokeless tobacco: Never  Vaping Use   Vaping Use: Never used  Substance Use Topics   Alcohol use:  No   Drug use: No    Home Medications Prior to Admission medications   Medication Sig Start Date End Date Taking? Authorizing Provider  acetaminophen (TYLENOL) 500 MG tablet Take 2 tablets (1,000 mg total) by mouth every 6 (six) hours. 08/20/18   Dorcas Carrow, MD  atorvastatin (LIPITOR) 20 MG tablet Take 20 mg by mouth every evening.     [provider]  bumetanide (BUMEX) 2 MG tablet Take by mouth daily.    [provider]  celecoxib (CELEBREX) 200 MG capsule Take 200 mg by mouth daily. 11/27/18   [provider]  doxycycline (VIBRA-TABS) 100 MG tablet Take 1 tablet (100 mg total) by mouth 2 (two) times daily. 02/23/19   Felecia Shelling, DPM  fluconazole (DIFLUCAN) 100 MG tablet Take 100  mg by mouth daily. 12/04/18   [provider]  gentamicin cream (GARAMYCIN) 0.1 % Apply 1 application topically 2 (two) times daily. 02/09/19   Felecia Shelling, DPM  insulin aspart (NOVOLOG) 100 UNIT/ML injection Inject 0-15 Units into the skin 4 (four) times daily -  before meals and at bedtime. 08/20/18   Dorcas Carrow, MD  levothyroxine (SYNTHROID, LEVOTHROID) 112 MCG tablet Take 112 mcg by mouth daily.    [provider]  metFORMIN (GLUCOPHAGE-XR) 500 MG 24 hr tablet Take 1,000 mg by mouth daily. 01/20/19   [provider]  metoprolol succinate (TOPROL-XL) 50 MG 24 hr tablet Take 50 mg by mouth daily. 12/30/17   [provider]  olopatadine (PATANOL) 0.1 % ophthalmic solution Place 1 drop into both eyes daily as needed for allergies.  12/30/17   [provider]  pantoprazole (PROTONIX) 40 MG tablet Take 1 tablet (40 mg total) by mouth daily. 02/09/14   Hart Carwin, MD  senna (SENOKOT) 8.6 MG tablet Take 1 tablet by mouth daily as needed for constipation.    [provider]  traZODone (DESYREL) 50 MG tablet Take 1 tablet (50 mg total) by mouth at bedtime as needed for sleep. 08/20/18   Dorcas Carrow, MD    Allergies    Bee venom and Epinephrine  Review of Systems   Review of Systems  Constitutional:  Negative for chills and fever.  HENT:  Negative for ear pain and sore throat.   Eyes:  Negative for pain and visual disturbance.  Respiratory:  Negative for cough and shortness of breath.   Cardiovascular:  Negative for chest pain and palpitations.  Gastrointestinal:  Negative for abdominal pain and vomiting.  Genitourinary:  Negative for dysuria and hematuria.  Musculoskeletal:  Positive for joint swelling. Negative for arthralgias and back pain.  Skin:  Positive for wound. Negative for color change and rash.  Neurological:  Negative for seizures and syncope.  All other systems reviewed and are negative.  Physical Exam Updated Vital  Signs BP (!) 169/76   Pulse 66   Temp 98.8 F (37.1 C) (Oral)   Resp 18   Ht 1.588 m (5' 2.5")   Wt 81.2 kg   LMP  (LMP Unknown)   SpO2 96%   BMI 32.22 kg/m   Physical Exam Vitals and nursing note reviewed.  Constitutional:      General: She is not in acute distress.    Appearance: She is well-developed.  HENT:     Head: Normocephalic.     Comments: 2 abrasions to the right forehead area.  Measuring about 2 cm each.  A right eyebrow laceration measuring about 3 cm.  Right  cheek area with a avulsion skin tear measuring about 2 cm. Eyes:     Extraocular Movements: Extraocular movements intact.     Conjunctiva/sclera: Conjunctivae normal.     Pupils: Pupils are equal, round, and reactive to light.     Comments: Patient with some blood to the sclera of the right eye.  But no hyphema.  Patient states that that blood to the sclera was there prior to the fall  Neck:     Comments: Questionable tenderness posteriorly Cardiovascular:     Rate and Rhythm: Normal rate and regular rhythm.     Heart sounds: No murmur heard. Pulmonary:     Effort: Pulmonary effort is normal. No respiratory distress.     Breath sounds: Normal breath sounds.  Abdominal:     Palpations: Abdomen is soft.     Tenderness: There is no abdominal tenderness.  Musculoskeletal:     Cervical back: Neck supple. Tenderness present.     Comments: Swelling to the ring finger of the right hand.  Bruising to the left knee with abrasions.  Distally neurovascularly intact.  Hand in particular has some limited range of motion at the PIP joint.  Has good cap refill sensation intact.  Skin:    General: Skin is warm and dry.     Capillary Refill: Capillary refill takes less than 2 seconds.  Neurological:     General: No focal deficit present.     Mental Status: She is alert and oriented to person, place, and time.     Cranial Nerves: No cranial nerve deficit.     Sensory: No sensory deficit.     Motor: No weakness.    ED  Results / Procedures / Treatments   Labs (all labs ordered are listed, but only abnormal results are displayed) Labs Reviewed - No data to display  EKG None  Radiology CT HEAD WO CONTRAST ( )  Result Date: 09/12/2020 CLINICAL DATA:  Head trauma, minor (Age >= 65y); Neck trauma (Age >= 65y) Pt brought in by EMS from home Pt fell on her porch, states she tripped on a step and fell Denies LOC Pt has a laceration to her right eyebrow and a skin tear on her right cheek EXAM: CT HEAD WITHOUT CONTRAST CT MAXILLOFACIAL WITHOUT CONTRAST CT CERVICAL SPINE WITHOUT CONTRAST TECHNIQUE: Multidetector CT imaging of the head, cervical spine, and maxillofacial structures were performed using the standard protocol without intravenous contrast. Multiplanar CT image reconstructions of the cervical spine and maxillofacial structures were also generated. COMPARISON:  CT head 07/29/2018, MRI cervical spine 06/27/2017 FINDINGS: CT HEAD FINDINGS Brain: Normal anatomic configuration. Parenchymal volume loss is commensurate with the patient's age. Mild periventricular white matter changes are present likely reflecting the sequela of small vessel ischemia. No abnormal intra or extra-axial mass lesion or fluid collection. No abnormal mass effect or midline shift. No evidence of acute intracranial hemorrhage or infarct. Ventricular size is normal. Cerebellum unremarkable. Vascular: No asymmetric hyperdense vasculature at the skull base. Skull: Intact Other: Mastoid air cells and middle ear cavities are clear. Mild right frontal scalp soft tissue swelling CT MAXILLOFACIAL FINDINGS Osseous: No fracture or mandibular dislocation. No destructive process. Orbits: There is mild right preseptal soft tissue swelling. Ocular lenses have been removed. Ocular globes are intact. Retro-orbital fat is preserved. Extraocular musculature and optic nerves are unremarkable. Sinuses: There is mild mucosal thickening within the sphenoid sinuses  bilaterally. No air-fluid levels. Remaining paranasal sinuses are clear. Soft tissues: Negative. CT CERVICAL SPINE FINDINGS Alignment:  Normal.  No listhesis. Skull base and vertebrae: Craniocervical alignment is normal. There are advanced degenerative changes noted at the atlantoaxial articulation bilaterally, right greater than left. Advanced degenerative changes are noted at the atlantodental articulation. No widening of the atlantodental interval. No acute fracture of the cervical spine. There is ankylosis of the facets of C4-5 bilaterally Soft tissues and spinal canal: Broad-based disc bulge at C2-3 in combination with hypertrophy of the lamina propria results in severe central canal stenosis with an AP diameter of the canal of approximately 4-5 mm. This appears progressive since prior examination. There is mild central canal stenosis at C5-6 secondary to a posterior disc osteophyte complex which mildly flattens the thecal sac. The spinal canal is otherwise widely patent. No canal hematoma. The prevertebral soft tissues are not thickened. No paraspinal fluid collections are identified. Disc levels: There is intervertebral disc space narrowing and endplate remodeling at C4-C7 in keeping with changes of severe degenerative disc disease. The vertebral body height has been preserved. Prevertebral soft tissues are not thickened on sagittal reformats. Review of the axial images demonstrates multilevel advanced uncovertebral and facet arthrosis resulting in multilevel moderate to severe neuroforaminal narrowing, most severe bilaterally at C3-4, on the left at C4-5, and bilaterally at C5-6. This appears mildly progressive since prior examination. Upper chest: Unremarkable Other: None IMPRESSION: No acute intracranial abnormality.  No calvarial fracture. Mild right frontal scalp and right preseptal soft tissue swelling. No acute facial fracture. Mild sphenoid sinus disease. No acute fracture or listhesis of the cervical  spine. 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 Correlation with neurologic examination is recommended. The degree of stenosis can be better assessed with MRI examination, if indicated. Electronically Signed   By: Helyn Numbers MD   On: 09/12/2020 21:19   CT Cervical Spine Wo Contrast  Result Date: 09/12/2020 CLINICAL DATA:  Head trauma, minor (Age >= 65y); Neck trauma (Age >= 65y) Pt brought in by EMS from home Pt fell on her porch, states she tripped on a step and fell Denies LOC Pt has a laceration to her right eyebrow and a skin tear on her right cheek EXAM: CT HEAD WITHOUT CONTRAST CT MAXILLOFACIAL WITHOUT CONTRAST CT CERVICAL SPINE WITHOUT CONTRAST TECHNIQUE: Multidetector CT imaging of the head, cervical spine, and maxillofacial structures were performed using the standard protocol without intravenous contrast. Multiplanar CT image reconstructions of the cervical spine and maxillofacial structures were also generated. COMPARISON:  CT head 07/29/2018, MRI cervical spine 06/27/2017 FINDINGS: CT HEAD FINDINGS Brain: Normal anatomic configuration. Parenchymal volume loss is commensurate with the patient's age. Mild periventricular white matter changes are present likely reflecting the sequela of small vessel ischemia. No abnormal intra or extra-axial mass lesion or fluid collection. No abnormal mass effect or midline shift. No evidence of acute intracranial hemorrhage or infarct. Ventricular size is normal. Cerebellum unremarkable. Vascular: No asymmetric hyperdense vasculature at the skull base. Skull: Intact Other: Mastoid air cells and middle ear cavities are clear. Mild right frontal scalp soft tissue swelling CT MAXILLOFACIAL FINDINGS Osseous: No fracture or mandibular dislocation. No destructive process. Orbits: There is mild  right preseptal soft tissue swelling. Ocular lenses have been removed. Ocular globes are intact. Retro-orbital fat is preserved. Extraocular musculature and optic nerves are unremarkable. Sinuses: There is mild mucosal thickening within the sphenoid sinuses bilaterally. No air-fluid levels. Remaining paranasal sinuses are clear. Soft tissues: Negative. CT  CERVICAL SPINE FINDINGS Alignment: Normal.  No listhesis. Skull base and vertebrae: Craniocervical alignment is normal. There are advanced degenerative changes noted at the atlantoaxial articulation bilaterally, right greater than left. Advanced degenerative changes are noted at the atlantodental articulation. No widening of the atlantodental interval. No acute fracture of the cervical spine. There is ankylosis of the facets of C4-5 bilaterally Soft tissues and spinal canal: Broad-based disc bulge at C2-3 in combination with hypertrophy of the lamina propria results in severe central canal stenosis with an AP diameter of the canal of approximately 4-5 mm. This appears progressive since prior examination. There is mild central canal stenosis at C5-6 secondary to a posterior disc osteophyte complex which mildly flattens the thecal sac. The spinal canal is otherwise widely patent. No canal hematoma. The prevertebral soft tissues are not thickened. No paraspinal fluid collections are identified. Disc levels: There is intervertebral disc space narrowing and endplate remodeling at C4-C7 in keeping with changes of severe degenerative disc disease. The vertebral body height has been preserved. Prevertebral soft tissues are not thickened on sagittal reformats. Review of the axial images demonstrates multilevel advanced uncovertebral and facet arthrosis resulting in multilevel moderate to severe neuroforaminal narrowing, most severe bilaterally at C3-4, on the left at C4-5, and bilaterally at C5-6. This appears mildly progressive since prior examination. Upper chest:  Unremarkable Other: None IMPRESSION: No acute intracranial abnormality.  No calvarial fracture. Mild right frontal scalp and right preseptal soft tissue swelling. No acute facial fracture. Mild sphenoid sinus disease. No acute fracture or listhesis of the cervical spine. 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 Correlation with neurologic examination is recommended. The degree of stenosis can be better assessed with MRI examination, if indicated. Electronically Signed   By: Helyn Numbers MD   On: 09/12/2020 21:19   CT Maxillofacial Wo Contrast  Result Date: 09/12/2020 CLINICAL DATA:  Head trauma, minor (Age >= 65y); Neck trauma (Age >= 65y) Pt brought in by EMS from home Pt fell on her porch, states she tripped on a step and fell Denies LOC Pt has a laceration to her right eyebrow and a skin tear on her right cheek EXAM: CT HEAD WITHOUT CONTRAST CT MAXILLOFACIAL WITHOUT CONTRAST CT CERVICAL SPINE WITHOUT CONTRAST TECHNIQUE: Multidetector CT imaging of the head, cervical spine, and maxillofacial structures were performed using the standard protocol without intravenous contrast. Multiplanar CT image reconstructions of the cervical spine and maxillofacial structures were also generated. COMPARISON:  CT head 07/29/2018, MRI cervical spine 06/27/2017 FINDINGS: CT HEAD FINDINGS Brain: Normal anatomic configuration. Parenchymal volume loss is commensurate with the patient's age. Mild periventricular white matter changes are present likely reflecting the sequela of small vessel ischemia. No abnormal intra or extra-axial mass lesion or fluid collection. No abnormal mass effect or midline shift. No evidence of acute intracranial hemorrhage or infarct. Ventricular size is normal. Cerebellum unremarkable. Vascular: No asymmetric hyperdense  vasculature at the skull base. Skull: Intact Other: Mastoid air cells and middle ear cavities are clear. Mild right frontal scalp soft tissue swelling CT MAXILLOFACIAL FINDINGS Osseous: No fracture or mandibular dislocation. No destructive process. Orbits: There is mild right preseptal soft tissue swelling. Ocular lenses have been removed. Ocular globes are intact. Retro-orbital fat is preserved. Extraocular musculature and optic nerves are unremarkable. Sinuses: There is mild mucosal thickening within the sphenoid sinuses bilaterally. No air-fluid levels. Remaining paranasal sinuses are clear. Soft  tissues: Negative. CT CERVICAL SPINE FINDINGS Alignment: Normal.  No listhesis. Skull base and vertebrae: Craniocervical alignment is normal. There are advanced degenerative changes noted at the atlantoaxial articulation bilaterally, right greater than left. Advanced degenerative changes are noted at the atlantodental articulation. No widening of the atlantodental interval. No acute fracture of the cervical spine. There is ankylosis of the facets of C4-5 bilaterally Soft tissues and spinal canal: Broad-based disc bulge at C2-3 in combination with hypertrophy of the lamina propria results in severe central canal stenosis with an AP diameter of the canal of approximately 4-5 mm. This appears progressive since prior examination. There is mild central canal stenosis at C5-6 secondary to a posterior disc osteophyte complex which mildly flattens the thecal sac. The spinal canal is otherwise widely patent. No canal hematoma. The prevertebral soft tissues are not thickened. No paraspinal fluid collections are identified. Disc levels: There is intervertebral disc space narrowing and endplate remodeling at C4-C7 in keeping with changes of severe degenerative disc disease. The vertebral body height has been preserved. Prevertebral soft tissues are not thickened on sagittal reformats. Review of the axial images demonstrates  multilevel advanced uncovertebral and facet arthrosis resulting in multilevel moderate to severe neuroforaminal narrowing, most severe bilaterally at C3-4, on the left at C4-5, and bilaterally at C5-6. This appears mildly progressive since prior examination. Upper chest: Unremarkable Other: None IMPRESSION: No acute intracranial abnormality.  No calvarial fracture. Mild right frontal scalp and right preseptal soft tissue swelling. No acute facial fracture. Mild sphenoid sinus disease. No acute fracture or listhesis of the cervical spine. 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 Correlation with neurologic examination is recommended. The degree of stenosis can be better assessed with MRI examination, if indicated. Electronically Signed   By: Helyn Numbers MD   On: 09/12/2020 21:19    Procedures .Marland KitchenLaceration Repair  Date/Time: 09/12/2020 11:45 PM Performed by: Vanetta Mulders, MD Authorized by: Vanetta Mulders, MD   Consent:    Consent obtained:  Verbal   Consent given by:  Patient   Risks discussed:  Pain and infection   Alternatives discussed:  No treatment Universal protocol:    Procedure explained and questions answered to patient or proxy's satisfaction: yes     Relevant documents present and verified: yes     Imaging studies available: yes     Site/side marked: yes     Immediately prior to procedure, a time out was called: yes     Patient identity confirmed:  Verbally with patient Anesthesia:    Anesthesia method:  Local infiltration   Local anesthetic:  Lidocaine 1% w/o epi Laceration details:    Location:  Face   Face location:  R eyebrow   Length (cm):  3 Pre-procedure details:    Preparation:  Patient was prepped and draped in usual sterile fashion and imaging obtained to evaluate for foreign  bodies Exploration:    Hemostasis achieved with:  Direct pressure   Imaging outcome: foreign body not noted     Wound exploration: entire depth of wound visualized     Contaminated: no   Treatment:    Area cleansed with:  Shur-Clens   Amount of cleaning:  Standard   Irrigation solution:  Sterile water   Irrigation method:  Syringe   Debridement:  Minimal   Undermining:  None Skin repair:    Repair method:  Sutures  Suture size:  6-0   Suture material:  Prolene   Suture technique:  Simple interrupted   Number of sutures:  4 Approximation:    Approximation:  Loose Repair type:    Repair type:  Complex Post-procedure details:    Dressing:  Antibiotic ointment   Procedure completion:  Tolerated Comments:     In addition to the right lower eyelid area more on the cheek area there was an avulsion skin tear that measured about 2 cm.  Part of that skin was dark and dusky this was excised.  Was able to put in one 6-0 Prolene stitch medially to help close that some.  Patient tolerated this well.  Antibiotic ointment applied   Medications Ordered in ED Medications  lidocaine (PF) (XYLOCAINE) 1 % injection 5 mL (5 mLs Infiltration Given 09/12/20 2235)  Tdap (BOOSTRIX) injection 0.5 mL (0.5 mLs Intramuscular Given 09/12/20 2235)    ED Course  I have reviewed the triage vital signs and the nursing notes.  Pertinent labs & imaging results that were available during my care of the patient were reviewed by me and considered in my medical decision making (see chart for details).    MDM Rules/Calculators/A&P                           CT head face neck without any acute findings.  Patient will need laceration repair of the right eyebrow.  May need a singles suture in the skin tear on the right cheek.  Also get x-rays of the left knee and right ring finger.  X-ray of the right ring finger just with advanced arthritis.  No fracture.  X-ray of the left knee negative for any bony  abnormality.  Patient stable for discharge home following the fall with wound care instructions.  Suture removal in 5 to 7 days.   Final Clinical Impression(s) / ED Diagnoses Final diagnoses:  Fall, initial encounter  Injury of head, initial encounter  Multiple skin tears  Eyebrow laceration, right, initial encounter    Rx / DC Orders ED Discharge Orders     None        Vanetta Mulders, MD 09/12/20 2245    Vanetta Mulders, MD 09/12/20 2349    Vanetta Mulders, MD 09/13/20 0005

## 2020-10-11 ENCOUNTER — Other Ambulatory Visit: Payer: Self-pay

## 2020-10-11 ENCOUNTER — Encounter: Payer: Self-pay | Admitting: Physical Therapy

## 2020-10-11 ENCOUNTER — Ambulatory Visit: Payer: Medicare PPO | Attending: Physician Assistant | Admitting: Physical Therapy

## 2020-10-11 DIAGNOSIS — M4125 Other idiopathic scoliosis, thoracolumbar region: Secondary | ICD-10-CM | POA: Insufficient documentation

## 2020-10-11 DIAGNOSIS — M6281 Muscle weakness (generalized): Secondary | ICD-10-CM | POA: Diagnosis present

## 2020-10-11 DIAGNOSIS — Z9181 History of falling: Secondary | ICD-10-CM | POA: Diagnosis present

## 2020-10-11 DIAGNOSIS — R293 Abnormal posture: Secondary | ICD-10-CM | POA: Diagnosis present

## 2020-10-11 DIAGNOSIS — R2689 Other abnormalities of gait and mobility: Secondary | ICD-10-CM | POA: Diagnosis present

## 2020-10-11 NOTE — Patient Instructions (Signed)
    Access Code: [email or web address] URL: [email or web address] Date: 10/11/2020 Prepared by: Glenetta Hew  Exercises Supine Pectoralis Stretch - 2 x daily - 7 x weekly - 3 reps - 30 sec hold Seated Cervical Retraction - 2 x daily - 7 x weekly - 2 sets - 10 reps - 3-5 sec hold Seated Scapular Retraction - 3 x daily - 7 x weekly - 2 sets - 10 reps - 5 sec hold Seated Thoracic Lumbar Extension - 1 x daily - 7 x weekly - 2 sets - 10 reps - 5 sec hold

## 2020-10-11 NOTE — Therapy (Signed)
[facility] [address]  [address] [place], [place], [address] Phone: [phone]   Fax:  [phone]  Physical Therapy Evaluation  Patient Details  Name: Hailey Hughes MRN: [id number] Date of Birth: 01-09-1941 Referring Provider (PT): Loma Sousa, [place]   Encounter Date: 10/11/2020   PT End of Session - 10/11/20 1102     Visit Number 1    Number of Visits 16    Date for PT Re-Evaluation 12/06/20    Authorization Type [employer]    PT Start Time 1102    PT Stop Time 1210    PT Time Calculation (min) 68 min    Activity Tolerance Patient tolerated treatment well    Behavior During Therapy WFL for tasks assessed/performed             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   Qualifier: Diagnosis of  By: Nelson-Smith CMA (AAMA), Dottie     HTN (hypertension)    Hyperlipidemia    Hypothyroidism    Imperforate anus    Obesity    OSA (obstructive sleep apnea)    Palpitations    Reflux esophagitis     Past Surgical History:  Procedure Laterality Date   APPENDECTOMY     CARPAL TUNNEL RELEASE Right    COLON RESECTION  1958   age 12   COLOSTOMY  21   age 82 due to imperforate anus   CYSTECTOMY     spinal   LAPAROTOMY N/A 07/23/2018   Procedure: TOTAL ABDOMINAL COLECTOMY, [name], CHOLECYSTECTOMY;  Surgeon: Claud Kelp, MD;  Location: [facility];  Service: General;  Laterality: N/A;   RECTOVAGINAL FISTULA CLOSURE     congenital    There were no vitals filed for this visit.    Subjective Assessment - 10/11/20 1122     Subjective Pt reports scoliosis and kyphosis in her spine that has gotten progressively worse leading to a 4 inch reduction in height. States she asked her MD about a brace vs PT and MD indicated both. She was fit for hopefully a  custom brace if it will accomodate her ileostomy. Wants to work with PT on being more erect to improve activity tolerance.    Limitations Standing;Walking    How long can you stand comfortably? 10 minutes    How long can you walk comfortably? 10 minutes    Patient Stated Goals "to be able to be more erect and have more endurance/stamina"    Currently in Pain? Yes    Pain Score 3     Pain Location Neck   & thoracic spine   Pain Orientation Right;Left;Lower   R>L   Pain Descriptors / Indicators Dull;Numbness    Pain Type Chronic pain    Pain Radiating Towards occasional R sided headache    Pain Onset Other (comment)   ~8 yrs   Pain Frequency Constant   varies in intensity   Aggravating Factors  walking with head extended to accomodate for kyphosis, falling asleep in a chair    Pain Relieving Factors Salon-pas patch, positiong (pillow under neck at night or in chair)    Effect of Pain on Daily Activities difficulty with self-care such as bathing                [facility] PT  Assessment - 10/11/20 1102       Assessment   Medical Diagnosis Scoliosis    Referring Provider (PT) Loma Sousa, PA-C    Onset Date/Surgical Date --   chronic   Hand Dominance Right    Prior Therapy PT for LBP on 2019      Precautions   Precaution Comments ileostomy      Balance Screen   Has the patient fallen in the past 6 months Yes    How many times? 1    Has the patient had a decrease in activity level because of a fear of falling?  No   but more cautious   Is the patient reluctant to leave their home because of a fear of falling?  No      Home Environment   Living Environment Private residence    Living Arrangements Alone    Type of Home House    Home Access Stairs to enter    Entrance Stairs-Number of Steps 2+1    Entrance Stairs-Rails None    Home Layout One level    Home Equipment Walker - 4 wheels;Cane - single point;Grab bars - tub/shower;Shower seat;Toilet riser;Grab bars - toilet       Prior Function   Level of Independence Independent    Vocation Retired    Leisure reading, going out to lunch with family/friends, mostly sedentary      Cognition   Overall Cognitive Status Within Functional Limits for tasks assessed      Posture/Postural Control   Posture/Postural Control Postural limitations    Postural Limitations Forward head;Rounded Shoulders;Increased thoracic kyphosis;Decreased lumbar lordosis;Flexed trunk    Posture Comments Pt reports she sleeps with HOB elevated ~45 with single pillow + small pillow under her neck      ROM / Strength   AROM / PROM / Strength AROM;Strength      AROM   AROM Assessment Site Cervical;Lumbar;Shoulder    Right/Left Shoulder Right;Left    Right Shoulder Flexion 59 Degrees    Right Shoulder ABduction 67 Degrees    Right Shoulder Internal Rotation --   FIR to buttock   Right Shoulder External Rotation --   FER to shoulder   Left Shoulder Flexion 148 Degrees    Left Shoulder ABduction 84 Degrees    Left Shoulder Internal Rotation --   FIR to sacrum   Left Shoulder External Rotation --   FER to C7   Cervical Flexion 62    Cervical Extension 16    Cervical - Right Side Bend 28   pain   Cervical - Left Side Bend 18   pain   Cervical - Right Rotation 39   pain   Cervical - Left Rotation 34   mild pain   Lumbar Flexion hands to mid shins    Lumbar Extension 75% limited    Lumbar - Right Side Bend hand to 2" above knee    Lumbar - Left Side Bend hand to 2" above knee    Lumbar - Right Rotation 60% limited    Lumbar - Left Rotation 75% limited      Strength   Strength Assessment Site Shoulder    Right/Left Shoulder Right;Left    Right Shoulder Flexion 3+/5    Right Shoulder Extension 4/5    Right Shoulder ABduction 3+/5    Right Shoulder Internal Rotation 4/5    Right Shoulder External Rotation 4-/5    Left Shoulder Flexion 2-/5    Left Shoulder Extension 4+/5  Left Shoulder ABduction 2-/5    Left Shoulder Internal  Rotation 4/5    Left Shoulder External Rotation 4/5                        Objective measurements completed on examination: See above findings.       [facility] Adult PT Treatment/Exercise - 10/11/20 1102       Neck Exercises: Seated   Neck Retraction 5 reps;5 secs    Other Seated Exercise Scap retraction + depression 5 x 5"    Other Seated Exercise Thoracic extension over back of chair with arms crossed over chest 5 x 5"   may do better with Airex pad to elevate her in the chair     Neck Exercises: Stretches   Chest Stretch 1 rep;30 seconds    Chest Stretch Limitations hooklying low anglel snow angel stretch                    PT Education - 10/11/20 1210     Education Details PT eval findings, anticipated POC & initial HEP - Access Code: [email or web address]    Person(s) Educated Patient    Methods Explanation;Demonstration;Verbal cues;Tactile cues;Handout    Comprehension Verbalized understanding;Verbal cues required;Tactile cues required;Returned demonstration;Need further instruction              PT Short Term Goals - 10/11/20 1210       PT SHORT TERM GOAL #1   Title Patient will be independent with initial HEP    Status New    Target Date 11/01/20               PT Long Term Goals - 10/11/20 1210       PT LONG TERM GOAL #1   Title Patient will be independent with ongoing/advanced HEP for self-management at home    Status New    Target Date 12/06/20      PT LONG TERM GOAL #2   Title Improve posture and alignment with patient to demonstrate improved upright posture with posterior shoulder girdle engaged    Status New    Target Date 12/06/20      PT LONG TERM GOAL #3   Title Patient to demonstrate symmetrical step length and good heel-toe pattern with upright posture when ambulating with or w/o SPC or LRAD.    Status New    Target Date 12/06/20      PT LONG TERM GOAL #4   Title Patient will improve standing and/or walking tolerance to  >/= 20 minutes with good posture w/o fatigue or pain interference to allow resumption of normal daily activities    Status New    Target Date 12/06/20      PT LONG TERM GOAL #5   Title Patient will demonstrate improved proximal UE/LE strength to >/= 4/5 for available ROM to promote improved stability and ease of mobility    Status New    Target Date 12/06/20                    Plan - 10/11/20 1210     Clinical Impression Statement Hailey Hughes is an 80 y/o female who presents to OP PT for scoliosis and kyphosis hoping to improve her posture and activity tolerance. She notes neck and thoracolumbar back pain due to stooped posture with limited tolerance for standing and walking activities. She relies on a cane or rollator for ambulation and has had one recent fall on  09/12/20 where she tripped and fell, landing on her face but luckily sustained no serious injury. Deficits include limited and painful cervical and thoracolumbar ROM, abnormal posture, increased muscle tension and TTP in thoracolumbar paraspinals, decreased proximal UE/LE and thoracolumbar flexibility, and B shoulder weakness with limited R shoulder AROM. Pain and musculoskeletal deficits limit activity tolerance and ability to participate in daily tasks. Hailey Hughes will benefit from skilled PT to address above deficits and improve posture, functional ROM and strength with decreased pain to increase tolerance for ADLs and daily activities. She may also benefit from further balance assessment and treatment as indicated to decrease her risk for future falls.    Personal Factors and Comorbidities Time since onset of injury/illness/exacerbation;Past/Current Experience;Fitness;Age;Comorbidity 3+;Social Background    Comorbidities Hypotension, HTN, Hirschsprung disease, ileostomy, DM-II, hypothyroidism, anxiety, depression, OSA    Examination-Activity Limitations Bathing;Bend;Lift;Carry;Reach Overhead;Squat;Stand;Locomotion Level;Stairs;Transfers     Examination-Participation Restrictions Cleaning;Community Activity;Laundry;Meal Prep;Shop    Stability/Clinical Decision Making Evolving/Moderate complexity    Clinical Decision Making Moderate    Rehab Potential Good    PT Frequency 2x / week    PT Duration 8 weeks    PT Treatment/Interventions ADLs/Self Care Home Management;Electrical Stimulation;Iontophoresis 4mg /ml Dexamethasone;Moist Heat;Ultrasound;DME Instruction;Gait training;Stair training;Functional mobility training;Therapeutic activities;Therapeutic exercise;Balance training;Neuromuscular re-education;Patient/family education;Manual techniques;Passive range of motion;Dry needling;Taping    PT Next Visit Plan Review initial HEP; postural flexibility & strengthening; assess LE strength & flexibilty    PT Home Exercise Plan Access Code: [email or web address])    Consulted and Agree with Plan of Care Patient             Patient will benefit from skilled therapeutic intervention in order to improve the following deficits and impairments:  Abnormal gait, Decreased activity tolerance, Decreased balance, Decreased endurance, Decreased knowledge of precautions, Decreased knowledge of use of DME, Decreased mobility, Decreased range of motion, Decreased safety awareness, Decreased strength, Difficulty walking, Increased fascial restricitons, Increased muscle spasms, Impaired perceived functional ability, Impaired flexibility, Improper body mechanics, Postural dysfunction, Pain, Impaired UE functional use  Visit Diagnosis: Other idiopathic scoliosis, thoracolumbar region  Abnormal posture  Muscle weakness (generalized)  Other abnormalities of gait and mobility  History of falling     Problem List Patient Active Problem List   Diagnosis Date Noted   Abdominal fluid collection 08/08/2018   Hyperphosphatemia 08/07/2018   Moderate protein malnutrition (HCC) 08/06/2018   Pressure injury of skin 08/05/2018   Acute respiratory failure  (HCC)    Sepsis (HCC)    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 07/22/2018   Hypotension    Elevated lactic acid level    CKD (chronic kidney disease) stage 3, GFR 30-59 ml/min (HCC) 12/30/2017   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   Anxiety 04/07/2015   Diabetes (HCC) 04/07/2015   Hyperlipidemia 04/07/2015   DYSPEPSIA 05/26/2008   Epigastric pain 05/26/2008   Hypothyroidism 05/21/2008   DIVERTICULOSIS, COLON 05/21/2008   Constipation 05/21/2008   Stenosis of rectum and anus 05/21/2008   FIBROSITIS 05/21/2008   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  [address] [place], [place], [place] Phone: [phone]   Fax:  [phone]  Name: Hailey Hughes MRN: Larina Bras Date of Birth: [birth year]

## 2020-10-13 ENCOUNTER — Ambulatory Visit: Payer: Medicare PPO | Attending: Physician Assistant

## 2020-10-13 ENCOUNTER — Other Ambulatory Visit: Payer: Self-pay

## 2020-10-13 DIAGNOSIS — M4125 Other idiopathic scoliosis, thoracolumbar region: Secondary | ICD-10-CM

## 2020-10-13 DIAGNOSIS — R2689 Other abnormalities of gait and mobility: Secondary | ICD-10-CM | POA: Diagnosis present

## 2020-10-13 DIAGNOSIS — R293 Abnormal posture: Secondary | ICD-10-CM

## 2020-10-13 DIAGNOSIS — Z9181 History of falling: Secondary | ICD-10-CM | POA: Diagnosis present

## 2020-10-13 DIAGNOSIS — M6281 Muscle weakness (generalized): Secondary | ICD-10-CM

## 2020-10-13 NOTE — Therapy (Signed)
[facility] [address]  [address] [place], [place], [address] Phone: [phone]   Fax:  [phone]  Physical Therapy Treatment  Patient Details  Name: Hailey Hughes MRN: [id number] Date of Birth: 12/16/1940 Referring Provider (PT): Loma Sousa, [place]   Encounter Date: 10/13/2020   PT End of Session - 10/13/20 1224     Visit Number 2    Number of Visits 16    Date for PT Re-Evaluation 12/06/20    Authorization Type [employer]    PT Start Time 1106    PT Stop Time 1146    PT Time Calculation (min) 40 min    Activity Tolerance Patient tolerated treatment well    Behavior During Therapy WFL for tasks assessed/performed             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   Qualifier: Diagnosis of  By: Nelson-Smith CMA (AAMA), Dottie     HTN (hypertension)    Hyperlipidemia    Hypothyroidism    Imperforate anus    Obesity    OSA (obstructive sleep apnea)    Palpitations    Reflux esophagitis     Past Surgical History:  Procedure Laterality Date   APPENDECTOMY     CARPAL TUNNEL RELEASE Right    COLON RESECTION  1958   age 19   COLOSTOMY  36   age 33 due to imperforate anus   CYSTECTOMY     spinal   LAPAROTOMY N/A 07/23/2018   Procedure: TOTAL ABDOMINAL COLECTOMY, [name], CHOLECYSTECTOMY;  Surgeon: Claud Kelp, MD;  Location: [facility];  Service: General;  Laterality: N/A;   RECTOVAGINAL FISTULA CLOSURE     congenital    There were no vitals filed for this visit.   Subjective Assessment - 10/13/20 1113     Subjective Has been doing HEP but feels like she hasn't had time to get familiar with exercises. Brought in medication list today.    Patient Stated Goals "to be able to be more erect and have more endurance/stamina"    Currently in  Pain? Yes    Pain Score 3     Pain Location Neck    Pain Orientation Right;Left;Lower    Pain Descriptors / Indicators Dull;Numbness    Pain Type Chronic pain                               [facility] Adult PT Treatment/Exercise - 10/13/20 0001       Exercises   Exercises Neck;Shoulder      Neck Exercises: Machines for Strengthening   UBE (Upper Arm Bike) L1.0 3 min fwd/ 3 min back      Neck Exercises: Seated   Neck Retraction 10 reps;5 secs    Other Seated Exercise Scap retraction + depression 10 x 5"    Other Seated Exercise Thoracic extension over back of chair with arms crossed over chest 10 x 5"; B thoracic rotation 10 reps      Neck Exercises: Stretches   Other Neck Stretches trunk rotations 10 reps each side      Shoulder Exercises: Seated   Horizontal ABduction AROM;Strengthening;Both;10 reps;Theraband    Theraband Level (Shoulder Horizontal ABduction) Level 1 (Yellow)    Horizontal ABduction  Limitations 10 reps w/o TB; 10 reps with TB    External Rotation AROM;Both;10 reps    External Rotation Limitations dinner plate position                    PT Education - 10/13/20 1226     Education Details HEP update: Access Code: [email or web address]    Person(s) Educated Patient    Methods Explanation;Demonstration;Verbal cues;Handout;Tactile cues    Comprehension Verbalized understanding;Returned demonstration;Verbal cues required;Tactile cues required;Need further instruction;Other (comment)              PT Short Term Goals - 10/13/20 1224       PT SHORT TERM GOAL #1   Title Patient will be independent with initial HEP    Status On-going    Target Date 11/01/20               PT Long Term Goals - 10/13/20 1224       PT LONG TERM GOAL #1   Title Patient will be independent with ongoing/advanced HEP for self-management at home    Status On-going      PT LONG TERM GOAL #2   Title Improve posture and alignment with patient to demonstrate  improved upright posture with posterior shoulder girdle engaged    Status On-going      PT LONG TERM GOAL #3   Title Patient to demonstrate symmetrical step length and good heel-toe pattern with upright posture when ambulating with or w/o SPC or LRAD.    Status On-going      PT LONG TERM GOAL #4   Title Patient will improve standing and/or walking tolerance to >/= 20 minutes with good posture w/o fatigue or pain interference to allow resumption of normal daily activities    Status On-going      PT LONG TERM GOAL #5   Title Patient will demonstrate improved proximal UE/LE strength to >/= 4/5 for available ROM to promote improved stability and ease of mobility    Status On-going                   Plan - 10/13/20 1226     Clinical Impression Statement Pt presents with increase kyphotics posture. Cues required to allow for scap retraction and to keep good upright posture during session with exercises. Progressed HEP with postural exercises focusing on stretching the pecs and strengthening the periscap muscles. Pt required much instruction with chin tucks to not allow her neck to flex. Overall she had a good response but required much instruction on posture to reduce kyphosis and optimize movement of the thoracolumbar spine.    Personal Factors and Comorbidities Time since onset of injury/illness/exacerbation;Past/Current Experience;Fitness;Age;Comorbidity 3+;Social Background    Comorbidities Hypotension, HTN, Hirschsprung disease, ileostomy, DM-II, hypothyroidism, anxiety, depression, OSA    PT Frequency 2x / week    PT Duration 8 weeks    PT Treatment/Interventions ADLs/Self Care Home Management;Electrical Stimulation;Iontophoresis 4mg /ml Dexamethasone;Moist Heat;Ultrasound;DME Instruction;Gait training;Stair training;Functional mobility training;Therapeutic activities;Therapeutic exercise;Balance training;Neuromuscular re-education;Patient/family education;Manual techniques;Passive  range of motion;Dry needling;Taping    PT Next Visit Plan Review HEP; postural flexibility & strengthening; assess LE strength & flexibilty    PT Home Exercise Plan Access Code: [email or web address]/[email or web address])    Consulted and Agree with Plan of Care Patient             Patient will benefit from skilled therapeutic intervention in order to improve the following deficits and impairments:  Abnormal gait, Decreased activity tolerance, Decreased balance, Decreased  endurance, Decreased knowledge of precautions, Decreased knowledge of use of DME, Decreased mobility, Decreased range of motion, Decreased safety awareness, Decreased strength, Difficulty walking, Increased fascial restricitons, Increased muscle spasms, Impaired perceived functional ability, Impaired flexibility, Improper body mechanics, Postural dysfunction, Pain, Impaired UE functional use  Visit Diagnosis: Other idiopathic scoliosis, thoracolumbar region  Abnormal posture  Muscle weakness (generalized)  Other abnormalities of gait and mobility  History of falling     Problem List Patient Active Problem List   Diagnosis Date Noted   Abdominal fluid collection 08/08/2018   Hyperphosphatemia 08/07/2018   Moderate protein malnutrition (HCC) 08/06/2018   Pressure injury of skin 08/05/2018   Acute respiratory failure (HCC)    Sepsis (HCC)    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 07/22/2018   Hypotension    Elevated lactic acid level    CKD (chronic kidney disease) stage 3, GFR 30-59 ml/min (HCC) 12/30/2017   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   Anxiety 04/07/2015   Diabetes (HCC) 04/07/2015   Hyperlipidemia 04/07/2015   DYSPEPSIA 05/26/2008   Epigastric pain 05/26/2008   Hypothyroidism 05/21/2008   DIVERTICULOSIS, COLON 05/21/2008   Constipation 05/21/2008   Stenosis of rectum and anus 05/21/2008   FIBROSITIS 05/21/2008   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   History of  Hirschsprung's disease 05/21/2008   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  [address] [place], [place], [address] Phone: [phone]   Fax:  [phone]  Name: Hailey Hughes MRN: [id number] Date of Birth: 1940-08-30

## 2020-10-18 ENCOUNTER — Encounter: Payer: Self-pay | Admitting: Physical Therapy

## 2020-10-18 ENCOUNTER — Ambulatory Visit: Payer: Medicare PPO | Admitting: Physical Therapy

## 2020-10-18 ENCOUNTER — Other Ambulatory Visit: Payer: Self-pay

## 2020-10-18 DIAGNOSIS — M4125 Other idiopathic scoliosis, thoracolumbar region: Secondary | ICD-10-CM

## 2020-10-18 DIAGNOSIS — Z9181 History of falling: Secondary | ICD-10-CM

## 2020-10-18 DIAGNOSIS — M6281 Muscle weakness (generalized): Secondary | ICD-10-CM

## 2020-10-18 DIAGNOSIS — R2689 Other abnormalities of gait and mobility: Secondary | ICD-10-CM

## 2020-10-18 DIAGNOSIS — R293 Abnormal posture: Secondary | ICD-10-CM

## 2020-10-18 NOTE — Patient Instructions (Signed)
   Access Code: [email or web address] URL: [email or web address] Date: 10/18/2020 Prepared by: Glenetta Hew  Exercises Supine Pectoralis Stretch - 2 x daily - 7 x weekly - 3 reps - 30 sec hold Seated Cervical Retraction - 2 x daily - 7 x weekly - 2 sets - 10 reps - 3-5 sec hold Seated Scapular Retraction - 3 x daily - 7 x weekly - 2 sets - 10 reps - 5 sec hold Seated Thoracic Lumbar Extension - 1 x daily - 7 x weekly - 2 sets - 10 reps - 5 sec hold Seated Shoulder External Rotation - 1 x daily - 7 x weekly - 2 sets - 10 reps - 3 sec hold Seated Shoulder Horizontal Abduction - Thumbs Up - 1 x daily - 7 x weekly - 2 sets - 10 reps - 3 sec hold Doorway Pec Stretch at 60 Degrees Abduction with Arm Straight - 2-3 x daily - 7 x weekly - 3 reps - 30 sec hold Standing Pec Stretch at Wall - 2-3 x daily - 7 x weekly - 3 reps - 30 sec hold

## 2020-10-18 NOTE — Therapy (Addendum)
[facility] Outpatient Rehabilitation [facility] [address]  [address] [place], [place], [address] Phone: [phone]   Fax:  [phone]  Physical Therapy Treatment  Patient Details  Name: Hailey Hughes MRN: [id number] Date of Birth: December 15, 1940 Referring Provider (PT): Hailey Sousa, [place]   Encounter Date: 10/18/2020   PT End of Session - 10/18/20 1104     Visit Number 3    Number of Visits 16    Date for PT Re-Evaluation 12/06/20    Authorization Type [employer]    Authorization Time Period 10/13/20 - 12/06/20   Authorization - Visit Number 2   Authorization - Number of Visits 16   PT Start Time 1104    PT Stop Time 1153    PT Time Calculation (min) 49 min    Activity Tolerance Patient tolerated treatment well    Behavior During Therapy WFL for tasks assessed/performed             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   Qualifier: Diagnosis of  By: Nelson-Smith CMA (AAMA), Dottie     HTN (hypertension)    Hyperlipidemia    Hypothyroidism    Imperforate anus    Obesity    OSA (obstructive sleep apnea)    Palpitations    Reflux esophagitis     Past Surgical History:  Procedure Laterality Date   APPENDECTOMY     CARPAL TUNNEL RELEASE Right    COLON RESECTION  1958   age 89   COLOSTOMY  74   age 38 due to imperforate anus   CYSTECTOMY     spinal   LAPAROTOMY N/A 07/23/2018   Procedure: TOTAL ABDOMINAL COLECTOMY, [name], CHOLECYSTECTOMY;  Surgeon: Claud Kelp, MD;  Location: [facility];  Service: General;  Laterality: N/A;   RECTOVAGINAL FISTULA CLOSURE     congenital    There were no vitals filed for this visit.   Subjective Assessment - 10/18/20 1108     Subjective Pt reports no numbness today, just "minor" pain. Pt reports she is feeling more comfortable with the HEP  but notes it is making her upper chest a little sore.    Patient Stated Goals "to be able to be more erect and have more endurance/stamina"    Currently in Pain? Yes    Pain Score 3     Pain Location Neck    Pain Orientation Lower    Pain Descriptors / Indicators Dull    Pain Type Chronic pain                [facility] PT Assessment - 10/18/20 1104       Ambulation/Gait   Assistive device Straight cane;None    Gait Pattern Trunk flexed;Decreased stride length;Decreased step length - right;Decreased step length - left;Decreased arm swing - right;Decreased arm swing - left;Poor foot clearance - left;Poor foot clearance - right    Gait velocity 2.03 ft/sec w/o AD      Standardized Balance Assessment   Standardized Balance Assessment Berg Balance Test;Timed Up and Go Test;Five Times Sit to Stand;10 meter walk test    10 Meter Walk 16.13 sec w/o AD      Berg Balance Test   Sit to Stand Able to stand without using hands and stabilize independently    Standing Unsupported Able to  stand safely 2 minutes    Sitting with Back Unsupported but Feet Supported on Floor or Stool Able to sit safely and securely 2 minutes    Stand to Sit Sits safely with minimal use of hands    Transfers Able to transfer safely, minor use of hands    Standing Unsupported with Eyes Closed Able to stand 10 seconds safely    Standing Unsupported with Feet Together Able to place feet together independently and stand 1 minute safely    From Standing, Reach Forward with Outstretched Arm Can reach forward >12 cm safely (5")    From Standing Position, Pick up Object from Floor Able to pick up shoe safely and easily    From Standing Position, Turn to Look Behind Over each Shoulder Turn sideways only but maintains balance    Turn 360 Degrees Able to turn 360 degrees safely but slowly    Standing Unsupported, Alternately Place Feet on Step/Stool Able to stand independently and complete 8 steps >20 seconds    Standing  Unsupported, One Foot in Front Able to plae foot ahead of the other independently and hold 30 seconds    Standing on One Leg Tries to lift leg/unable to hold 3 seconds but remains standing independently    Total Score 46    Berg comment: 46-51 moderate (>50%)      Timed Up and Go Test   Normal TUG (seconds) 16.63   with cane; 15.47 sec w/o AD   TUG Comments >13.5 sec indicates high fall risk      Functional Gait  Assessment   Gait assessed  Yes    Gait Level Surface Walks 20 ft, slow speed, abnormal gait pattern, evidence for imbalance or deviates 10-15 in outside of the 12 in walkway width. Requires more than 7 sec to ambulate 20 ft.    Change in Gait Speed Able to change speed, demonstrates mild gait deviations, deviates 6-10 in outside of the 12 in walkway width, or no gait deviations, unable to achieve a major change in velocity, or uses a change in velocity, or uses an assistive device.    Gait with Horizontal Head Turns Performs head turns smoothly with slight change in gait velocity (eg, minor disruption to smooth gait path), deviates 6-10 in outside 12 in walkway width, or uses an assistive device.    Gait with Vertical Head Turns Performs task with slight change in gait velocity (eg, minor disruption to smooth gait path), deviates 6 - 10 in outside 12 in walkway width or uses assistive device    Gait and Pivot Turn Pivot turns safely within 3 sec and stops quickly with no loss of balance.    Step Over Obstacle Is able to step over one shoe box (4.5 in total height) but must slow down and adjust steps to clear box safely. May require verbal cueing.    Gait with Narrow Base of Support Ambulates 4-7 steps.    Gait with Eyes Closed Cannot walk 20 ft without assistance, severe gait deviations or imbalance, deviates greater than 15 in outside 12 in walkway width or will not attempt task.    Ambulating Backwards Walks 20 ft, slow speed, abnormal gait pattern, evidence for imbalance, deviates  10-15 in outside 12 in walkway width.    Steps Two feet to a stair, must use rail.    Total Score 14    FGA comment: < 19 = high risk fall  [facility] Adult PT Treatment/Exercise - 10/18/20 1104       Neck Exercises: Machines for Strengthening   Nustep L3 x 6 min (UE/LE)      Shoulder Exercises: Seated   External Rotation Both;10 reps;AROM    External Rotation Limitations neutral shoulder      Shoulder Exercises: Stretch   Other Shoulder Stretches B low & unilateral mid straight arm doorway pec stretches x 30 sec                    PT Education - 10/18/20 1153     Education Details HEP update - pec stretches (plus reprint of last session HEP with larger print) - Access Code: [email or web address]; Results of balance testing with associated fall risk    Person(s) Educated Patient    Methods Explanation;Demonstration;Verbal cues;Handout    Comprehension Verbalized understanding;Verbal cues required;Returned demonstration;Need further instruction              PT Short Term Goals - 10/13/20 1224       PT SHORT TERM GOAL #1   Title Patient will be independent with initial HEP    Status On-going    Target Date 11/01/20               PT Long Term Goals - 10/18/20 1153       PT LONG TERM GOAL #1   Title Patient will be independent with ongoing/advanced HEP for self-management at home    Status On-going    Target Date 12/06/20      PT LONG TERM GOAL #2   Title Improve posture and alignment with patient to demonstrate improved upright posture with posterior shoulder girdle engaged    Status On-going    Target Date 12/06/20      PT LONG TERM GOAL #3   Title Patient to demonstrate symmetrical step length and good heel-toe pattern with upright posture when ambulating with or w/o SPC or LRAD.    Status On-going    Target Date 12/06/20      PT LONG TERM GOAL #4   Title Patient will improve standing and/or walking tolerance to >/=  20 minutes with good posture w/o fatigue or pain interference to allow resumption of normal daily activities    Status On-going    Target Date 12/06/20      PT LONG TERM GOAL #5   Title Patient will demonstrate improved proximal UE/LE strength to >/= 4/5 for available ROM to promote improved stability and ease of mobility    Status On-going    Target Date 12/06/20      PT LONG TERM GOAL #6   Title Patient will demonstrate decreased TUG time to </= 13.5 sec to decrease risk for falls with transitional mobility    Baseline 16.63 sec with cane; 15.47 sec w/o AD    Status New    Target Date 12/06/20      PT LONG TERM GOAL #7   Title Patient will improve FGA score to >/= 20/30 to improve gait stability and reduce risk for falls    Baseline 14/30    Status New    Target Date 12/06/20                   Plan - 10/18/20 1153     Clinical Impression Statement Hailey Hughes noting some increased upper chest soreness with HEP which she thinks may be due to the shoulder ER with hands elevated - better tolerated with neutral shoulder.  Introduced pec stretching to help improve posture and exercise tolerance with pt requesting HEP instructions. Pt also requesting to proceed with previously discussed balance assessment, therefore assessed gait speed (2.03 ft/sec w/o AD) and administered Berg (46/56), TUG (16.63 sec with cane; 15.47 sec w/o AD) and FGA (14/30) - all indicating a moderate to high fall risk. Goals and POC updated to include balance focus.    Comorbidities Hypotension, HTN, Hirschsprung disease, ileostomy, DM-II, hypothyroidism, anxiety, depression, OSA    Rehab Potential Good    PT Frequency 2x / week    PT Duration 8 weeks    PT Treatment/Interventions ADLs/Self Care Home Management;Electrical Stimulation;Iontophoresis /ml Dexamethasone;Moist Heat;Ultrasound;DME Instruction;Gait training;Stair training;Functional mobility training;Therapeutic activities;Therapeutic exercise;Balance  training;Neuromuscular re-education;Patient/family education;Manual techniques;Passive range of motion;Dry needling;Taping    PT Next Visit Plan postural flexibility & strengthening; assess LE strength & flexibilty    PT Home Exercise Plan Access Code: [email or web address] (8/30, updated 9/1 & 9/6)    Consulted and Agree with Plan of Care Patient             Patient will benefit from skilled therapeutic intervention in order to improve the following deficits and impairments:  Abnormal gait, Decreased activity tolerance, Decreased balance, Decreased endurance, Decreased knowledge of precautions, Decreased knowledge of use of DME, Decreased mobility, Decreased range of motion, Decreased safety awareness, Decreased strength, Difficulty walking, Increased fascial restricitons, Increased muscle spasms, Impaired perceived functional ability, Impaired flexibility, Improper body mechanics, Postural dysfunction, Pain, Impaired UE functional use  Visit Diagnosis: Other idiopathic scoliosis, thoracolumbar region  Abnormal posture  Muscle weakness (generalized)  Other abnormalities of gait and mobility  History of falling     Problem List Patient Active Problem List   Diagnosis Date Noted   Abdominal fluid collection 08/08/2018   Hyperphosphatemia 08/07/2018   Moderate protein malnutrition (HCC) 08/06/2018   Pressure injury of skin 08/05/2018   Acute respiratory failure (HCC)    Sepsis (HCC)    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 07/22/2018   Hypotension    Elevated lactic acid level    CKD (chronic kidney disease) stage 3, GFR 30-59 ml/min (HCC) 12/30/2017   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   Anxiety 04/07/2015   Diabetes (HCC) 04/07/2015   Hyperlipidemia 04/07/2015   DYSPEPSIA 05/26/2008   Epigastric pain 05/26/2008   Hypothyroidism 05/21/2008   DIVERTICULOSIS, COLON 05/21/2008   Constipation 05/21/2008   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   FIBROSITIS 05/21/2008   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

## 2020-10-21 ENCOUNTER — Other Ambulatory Visit: Payer: Self-pay

## 2020-10-21 ENCOUNTER — Ambulatory Visit: Payer: Medicare PPO

## 2020-10-21 DIAGNOSIS — M4125 Other idiopathic scoliosis, thoracolumbar region: Secondary | ICD-10-CM | POA: Diagnosis not present

## 2020-10-21 DIAGNOSIS — Z9181 History of falling: Secondary | ICD-10-CM

## 2020-10-21 DIAGNOSIS — R2689 Other abnormalities of gait and mobility: Secondary | ICD-10-CM

## 2020-10-21 DIAGNOSIS — M6281 Muscle weakness (generalized): Secondary | ICD-10-CM

## 2020-10-21 DIAGNOSIS — R293 Abnormal posture: Secondary | ICD-10-CM

## 2020-10-21 NOTE — Therapy (Signed)
[facility] Outpatient Rehabilitation [facility] [address]  [address] [place], [place], [address] Phone: [phone]   Fax:  [phone]  Physical Therapy Treatment  Patient Details  Name: Hailey Hughes MRN: [id number] Date of Birth: [birth year] Referring Provider (PT): Loma Sousa, [place]   Encounter Date: 10/21/2020   PT End of Session - 10/21/20 1158     Visit Number 4    Number of Visits 16    Date for PT Re-Evaluation 12/06/20    Authorization Type [employer]    PT Start Time 1103    PT Stop Time 1143    PT Time Calculation (min) 40 min    Activity Tolerance Patient tolerated treatment well    Behavior During Therapy WFL for tasks assessed/performed             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   Qualifier: Diagnosis of  By: Nelson-Smith CMA (AAMA), Dottie     HTN (hypertension)    Hyperlipidemia    Hypothyroidism    Imperforate anus    Obesity    OSA (obstructive sleep apnea)    Palpitations    Reflux esophagitis     Past Surgical History:  Procedure Laterality Date   APPENDECTOMY     CARPAL TUNNEL RELEASE Right    COLON RESECTION  1958   age 1   COLOSTOMY  54   age 44 due to imperforate anus   CYSTECTOMY     spinal   LAPAROTOMY N/A 07/23/2018   Procedure: TOTAL ABDOMINAL COLECTOMY, [name], CHOLECYSTECTOMY;  Surgeon: Claud Kelp, MD;  Location: [facility];  Service: General;  Laterality: N/A;   RECTOVAGINAL FISTULA CLOSURE     congenital    There were no vitals filed for this visit.   Subjective Assessment - 10/21/20 1107     Subjective Not much soreness today.    Patient Stated Goals "to be able to be more erect and have more endurance/stamina"    Currently in Pain? Yes    Pain Score 3     Pain Location Neck    Pain Orientation Right    Pain Descriptors  / Indicators Dull    Pain Type Chronic pain                [facility] PT Assessment - 10/21/20 0001       Strength   Strength Assessment Site Hip;Knee    Right/Left Hip Right;Left    Right Hip Flexion 4+/5    Right Hip ABduction 4+/5    Right Hip ADduction 4/5    Left Hip Flexion 4+/5    Left Hip ABduction 4+/5    Left Hip ADduction 4/5    Right/Left Knee Right;Left    Right Knee Flexion 4+/5    Right Knee Extension 4+/5    Left Knee Flexion 4+/5    Left Knee Extension 4+/5      Flexibility   Soft Tissue Assessment /Muscle Length yes    Hamstrings mild tightness noted    Quadriceps unable to assess in [place] position    ITB unable to assess due to dizziness in S/L position                           [facility] Adult PT Treatment/Exercise -  10/21/20 0001       Exercises   Exercises Neck;Shoulder      Neck Exercises: Machines for Strengthening   UBE (Upper Arm Bike) L1.0 3 min fwd/ 3 min back      Knee/Hip Exercises: Seated   Sit to Sand 10 reps;without UE support      Shoulder Exercises: Seated   Extension Strengthening;Both;10 reps;Theraband    Theraband Level (Shoulder Extension) Level 1 (Yellow)    Row Strengthening;Both;10 reps;Theraband    Theraband Level (Shoulder Row) Level 1 (Yellow)      Shoulder Exercises: Stretch   Corner Stretch 2 reps;30 seconds    Corner Stretch Limitations Bilateral                       PT Short Term Goals - 10/13/20 1224       PT SHORT TERM GOAL #1   Title Patient will be independent with initial HEP    Status On-going    Target Date 11/01/20               PT Long Term Goals - 10/18/20 1153       PT LONG TERM GOAL #1   Title Patient will be independent with ongoing/advanced HEP for self-management at home    Status On-going    Target Date 12/06/20      PT LONG TERM GOAL #2   Title Improve posture and alignment with patient to demonstrate improved upright posture with posterior shoulder  girdle engaged    Status On-going    Target Date 12/06/20      PT LONG TERM GOAL #3   Title Patient to demonstrate symmetrical step length and good heel-toe pattern with upright posture when ambulating with or w/o SPC or LRAD.    Status On-going    Target Date 12/06/20      PT LONG TERM GOAL #4   Title Patient will improve standing and/or walking tolerance to >/= 20 minutes with good posture w/o fatigue or pain interference to allow resumption of normal daily activities    Status On-going    Target Date 12/06/20      PT LONG TERM GOAL #5   Title Patient will demonstrate improved proximal UE/LE strength to >/= 4/5 for available ROM to promote improved stability and ease of mobility    Status On-going    Target Date 12/06/20      PT LONG TERM GOAL #6   Title Patient will demonstrate decreased TUG time to </= 13.5 sec to decrease risk for falls with transitional mobility    Baseline 16.63 sec with cane; 15.47 sec w/o AD    Status New    Target Date 12/06/20      PT LONG TERM GOAL #7   Title Patient will improve FGA score to >/= 20/30 to improve gait stability and reduce risk for falls    Baseline 14/30    Status New    Target Date 12/06/20                   Plan - 10/21/20 1207     Clinical Impression Statement Pt noted improvements in pain along the upper chest, but still feels increased pulling with exercises. I educated her that this may be increased tightness in the pec muscles from her rounded shoulder posture. Found mild tightness in the B hamstrings today. Unable to assess quad and ITB flexibility in ideal positions. She had a brief episode of  dizziness when attempting to lie on her L side, she required increased time to recover and once brought to an upright position symptoms subsided. B LE strength grossly 4+/5, only limited in B hip adductors. Cues given to emphasize scap retraction during scap stab exercises but otherwise pt w/o any complaints post session.     Personal Factors and Comorbidities Time since onset of injury/illness/exacerbation;Past/Current Experience;Fitness;Age;Comorbidity 3+;Social Background    Comorbidities Hypotension, HTN, Hirschsprung disease, ileostomy, DM-II, hypothyroidism, anxiety, depression, OSA    PT Frequency 2x / week    PT Duration 8 weeks    PT Treatment/Interventions ADLs/Self Care Home Management;Electrical Stimulation;Iontophoresis 4mg /ml Dexamethasone;Moist Heat;Ultrasound;DME Instruction;Gait training;Stair training;Functional mobility training;Therapeutic activities;Therapeutic exercise;Balance training;Neuromuscular re-education;Patient/family education;Manual techniques;Passive range of motion;Dry needling;Taping    PT Next Visit Plan postural flexibility & strengthening; functional LE strengthening    PT Home Exercise Plan Access Code: (8/30, updated 9/1 & 9/6)    Consulted and Agree with Plan of Care Patient             Patient will benefit from skilled therapeutic intervention in order to improve the following deficits and impairments:  Abnormal gait, Decreased activity tolerance, Decreased balance, Decreased endurance, Decreased knowledge of precautions, Decreased knowledge of use of DME, Decreased mobility, Decreased range of motion, Decreased safety awareness, Decreased strength, Difficulty walking, Increased fascial restricitons, Increased muscle spasms, Impaired perceived functional ability, Impaired flexibility, Improper body mechanics, Postural dysfunction, Pain, Impaired UE functional use  Visit Diagnosis: Other idiopathic scoliosis, thoracolumbar region  Abnormal posture  Muscle weakness (generalized)  Other abnormalities of gait and mobility  History of falling     Problem List Patient Active Problem List   Diagnosis Date Noted   Abdominal fluid collection 08/08/2018   Hyperphosphatemia 08/07/2018   Moderate protein malnutrition (HCC) 08/06/2018   Pressure injury of skin  08/05/2018   Acute respiratory failure (HCC)    Sepsis (HCC)    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 07/22/2018   Hypotension    Elevated lactic acid level    CKD (chronic kidney disease) stage 3, GFR 30-59 ml/min (HCC) 12/30/2017   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   Anxiety 04/07/2015   Diabetes (HCC) 04/07/2015   Hyperlipidemia 04/07/2015   DYSPEPSIA 05/26/2008   Epigastric pain 05/26/2008   Hypothyroidism 05/21/2008   DIVERTICULOSIS, COLON 05/21/2008   Constipation 05/21/2008   Stenosis of rectum and anus 05/21/2008   FIBROSITIS 05/21/2008   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  [address] [place], [place], [place] Phone: [phone]   Fax:  [phone]  Name: Hailey Hughes MRN: [id number] Bras Date of Birth: [birth year]

## 2020-10-25 ENCOUNTER — Ambulatory Visit: Payer: Medicare PPO | Admitting: Physical Therapy

## 2020-10-25 ENCOUNTER — Other Ambulatory Visit: Payer: Self-pay

## 2020-10-25 ENCOUNTER — Encounter: Payer: Self-pay | Admitting: Physical Therapy

## 2020-10-25 DIAGNOSIS — R293 Abnormal posture: Secondary | ICD-10-CM

## 2020-10-25 DIAGNOSIS — R2689 Other abnormalities of gait and mobility: Secondary | ICD-10-CM

## 2020-10-25 DIAGNOSIS — M4125 Other idiopathic scoliosis, thoracolumbar region: Secondary | ICD-10-CM | POA: Diagnosis not present

## 2020-10-25 DIAGNOSIS — M6281 Muscle weakness (generalized): Secondary | ICD-10-CM

## 2020-10-25 DIAGNOSIS — Z9181 History of falling: Secondary | ICD-10-CM

## 2020-10-25 NOTE — Patient Instructions (Signed)
    Access Code: [email or web address] URL: [email or web address] Date: 10/25/2020 Prepared by: Glenetta Hew  Exercises Supine Pectoralis Stretch - 2 x daily - 7 x weekly - 3 reps - 30 sec hold Seated Cervical Retraction - 2 x daily - 7 x weekly - 2 sets - 10 reps - 3-5 sec hold Seated Scapular Retraction - 3 x daily - 7 x weekly - 2 sets - 10 reps - 5 sec hold Seated Thoracic Lumbar Extension - 1 x daily - 7 x weekly - 2 sets - 10 reps - 5 sec hold Seated Shoulder External Rotation - 1 x daily - 7 x weekly - 2 sets - 10 reps - 3 sec hold Seated Shoulder Horizontal Abduction - Thumbs Up - 1 x daily - 7 x weekly - 2 sets - 10 reps - 3 sec hold Doorway Pec Stretch at 60 Degrees Abduction with Arm Straight - 2-3 x daily - 7 x weekly - 3 reps - 30 sec hold Standing Pec Stretch at Wall - 2-3 x daily - 7 x weekly - 3 reps - 30 sec hold Seated Hamstring Stretch - 2-3 x daily - 7 x weekly - 3 reps - 30 sec hold Seated Hip Flexor Stretch - 2-3 x daily - 7 x weekly - 3 reps - 30 sec hold Standing ITB Stretch - 2-3 x daily - 7 x weekly - 3 reps - 30 sec hold Seated Pelvic Floor Contraction with Isometric Hip Adduction - 1 x daily - 7 x weekly - 2 sets - 10 reps - 3 sec hold

## 2020-10-25 NOTE — Therapy (Signed)
[facility] [address]  [place], [place], [address] Phone: [phone]   Fax:  [phone]  Physical Therapy Treatment  Patient Details  Name: Hailey Hughes MRN: [id number] Date of Birth: February 25, 1940 Referring Provider (PT): Domingo Pulse, [place]   Encounter Date: 10/25/2020   PT End of Session - 10/25/20 1105     Visit Number 5    Number of Visits 16    Date for PT Re-Evaluation 12/06/20    Authorization Type [employer]    Authorization Time Period 10/13/20 - 12/06/20    Authorization - Visit Number 4    Authorization - Number of Visits 16    PT Start Time 2979    PT Stop Time 1156    PT Time Calculation (min) 51 min    Activity Tolerance Patient tolerated treatment well    Behavior During Therapy WFL for tasks assessed/performed             Past Medical History:  Diagnosis Date   Depression    Diverticulosis    DM2 (diabetes mellitus, type 2) ([place])    Hiatal hernia    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   Qualifier: Diagnosis of  By: Nelson-Smith CMA (AAMA), Dottie     HTN (hypertension)    Hyperlipidemia    Hypothyroidism    Imperforate anus    Obesity    OSA (obstructive sleep apnea)    Palpitations    Reflux esophagitis     Past Surgical History:  Procedure Laterality Date   APPENDECTOMY     CARPAL TUNNEL RELEASE Right    COLON RESECTION  1958   age 31   COLOSTOMY  66   age 61 due to imperforate anus   CYSTECTOMY     spinal   LAPAROTOMY N/A 07/23/2018   Procedure: TOTAL ABDOMINAL COLECTOMY, [name], CHOLECYSTECTOMY;  Surgeon: Fanny Skates, MD;  Location: [facility];  Service: General;  Laterality: N/A;   RECTOVAGINAL FISTULA CLOSURE     congenital    There were no vitals filed for this visit.   Subjective Assessment - 10/25/20 1110     Subjective Pt reports no pain "at the moment", but still has some pain up to 2/10 with some activity. She does  note some increased soreness in her thighs over the weekend after getting up/down from different height chairs while at a family reunion.    Patient Stated Goals "to be able to be more erect and have more endurance/stamina"    Currently in Pain? No/denies                               [facility] Adult PT Treatment/Exercise - 10/25/20 1105       Neck Exercises: Machines for Strengthening   UBE (Upper Arm Bike) L1.0 x 6 min (3' fwd/3' back)      Lumbar Exercises: Stretches   Passive Hamstring Stretch Right;Left;3 reps;30 seconds    Passive Hamstring Stretch Limitations supine with strap & seated hip hinge with foot resting on 9" stool   pt preferring the seated version   Hip Flexor Stretch Right;Left;2 reps;30 seconds    Hip Flexor Stretch Limitations mod thomas with strap & seated lunge position over edge of chair   pt preferring the seated version   ITB Stretch Right;Left;3 reps;30 seconds    ITB Stretch Limitations supine crossbody with strap & standing lateral lean over  counter   pt preferring standing version     Knee/Hip Exercises: Seated   Ball Squeeze 10 x 5" + abdominal bracing    Sit to Sand 5 reps;without UE support   pt c/o R anterior knee pain - reduced with manual medial patellar glide                    PT Education - 10/25/20 1155     Education Details HEP update - proximal LE stretching & lumbopelvic strengthening - Access Code: [email or web address]    Person(s) Educated Patient    Methods Explanation;Demonstration;Verbal cues;Handout    Comprehension Verbalized understanding;Verbal cues required;Returned demonstration;Need further instruction              PT Short Term Goals - 10/25/20 1120       PT SHORT TERM GOAL #1   Title Patient will be independent with initial HEP    Status Achieved   10/25/20              PT Long Term Goals - 10/25/20 1121       PT LONG TERM GOAL #1   Title Patient will be independent with ongoing/advanced  HEP for self-management at home    Status On-going    Target Date 12/06/20      PT LONG TERM GOAL #2   Title Improve posture and alignment with patient to demonstrate improved upright posture with posterior shoulder girdle engaged    Status On-going    Target Date 12/06/20      PT LONG TERM GOAL #3   Title Patient to demonstrate symmetrical step length and good heel-toe pattern with upright posture when ambulating with or w/o SPC or LRAD.    Status On-going    Target Date 12/06/20      PT LONG TERM GOAL #4   Title Patient will improve standing and/or walking tolerance to >/= 20 minutes with good posture w/o fatigue or pain interference to allow resumption of normal daily activities    Status On-going    Target Date 12/06/20      PT LONG TERM GOAL #5   Title Patient will demonstrate improved proximal UE/LE strength to >/= 4/5 for available ROM to promote improved stability and ease of mobility    Status On-going    Target Date 12/06/20      PT LONG TERM GOAL #6   Title Patient will demonstrate decreased TUG time to </= 13.5 sec to decrease risk for falls with transitional mobility    Baseline 16.63 sec with cane; 15.47 sec w/o AD    Status On-going    Target Date 12/06/20      PT LONG TERM GOAL #7   Title Patient will improve FGA score to >/= 20/30 to improve gait stability and reduce risk for falls    Baseline 14/30    Status On-going    Target Date 12/06/20                   Plan - 10/25/20 1122     Clinical Impression Statement Hailey Hughes reports increased soreness in her thighs after having to transition sit <> stand from varying heights while at a family reunion over the weekend. Pt also noting R anterior knee pain with sit <> stand attempts w/o UE assist today, which was able to be reduced with manual medial patellar glide by PT during sit <> stand. Noted limitations in flexibility assessment last visit, therefore completed modified assessment from  supine with mild  tightness identified in HS, ITB, quads and hip flexors. Provided instruction in stretching targeting tight muscles with pt expressing preference/better tolerance for seated and standing version of the stretches - HEP updated accordingly. Pt reports previously provided HEP exercises are going well w/o issues and denied need for further review - STG met. Initiated core/lumbopelvic strengthening targeting abdominal bracing and hip adduction isometric based on weakness identified during MMT and will plan to further expand on this along with continued postural strengthening in upcoming visits.    Comorbidities Hypotension, HTN, Hirschsprung disease, ileostomy, DM-II, hypothyroidism, anxiety, depression, OSA    Rehab Potential Good    PT Frequency 2x / week    PT Duration 8 weeks    PT Treatment/Interventions ADLs/Self Care Home Management;Electrical Stimulation;Iontophoresis 35m/ml Dexamethasone;Moist Heat;Ultrasound;DME Instruction;Gait training;Stair training;Functional mobility training;Therapeutic activities;Therapeutic exercise;Balance training;Neuromuscular re-education;Patient/family education;Manual techniques;Passive range of motion;Dry needling;Taping    PT Next Visit Plan postural and lumopelvic flexibility & strengthening    PT Home Exercise Plan Access Code: [email or web address](8/30, updated 9/1, 9/6 & 9/13)    Consulted and Agree with Plan of Care Patient             Patient will benefit from skilled therapeutic intervention in order to improve the following deficits and impairments:  Abnormal gait, Decreased activity tolerance, Decreased balance, Decreased endurance, Decreased knowledge of precautions, Decreased knowledge of use of DME, Decreased mobility, Decreased range of motion, Decreased safety awareness, Decreased strength, Difficulty walking, Increased fascial restricitons, Increased muscle spasms, Impaired perceived functional ability, Impaired flexibility, Improper body mechanics, Postural  dysfunction, Pain, Impaired UE functional use  Visit Diagnosis: Other idiopathic scoliosis, thoracolumbar region  Abnormal posture  Muscle weakness (generalized)  Other abnormalities of gait and mobility  History of falling     Problem List Patient Active Problem List   Diagnosis Date Noted   Abdominal fluid collection 08/08/2018   Hyperphosphatemia 08/07/2018   Moderate protein malnutrition ([place] 08/06/2018   Pressure injury of skin 08/05/2018   Acute respiratory failure (HCC)    Sepsis ([place]    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 07/22/2018   Hypotension    Elevated lactic acid level    CKD (chronic kidney disease) stage 3, GFR 30-59 ml/min (HCC) 12/30/2017   Mild episode of recurrent major depressive disorder ([place] 12/30/2017   Anxiety 04/07/2015   Diabetes ([place] 04/07/2015   Hyperlipidemia 04/07/2015   DYSPEPSIA 05/26/2008   Epigastric pain 05/26/2008   Hypothyroidism 05/21/2008   DIVERTICULOSIS, COLON 05/21/2008   Constipation 05/21/2008   Stenosis of rectum and anus 05/21/2008   FIBROSITIS 05/21/2008   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 [place] [address]: [phone]  Fax:  [phone] Name: VVernis Cabacungan Hughes MRN: [id number] of Birth: 411/30/1942

## 2020-10-28 ENCOUNTER — Other Ambulatory Visit: Payer: Self-pay

## 2020-10-28 ENCOUNTER — Ambulatory Visit: Payer: Medicare PPO

## 2020-10-28 DIAGNOSIS — R2689 Other abnormalities of gait and mobility: Secondary | ICD-10-CM

## 2020-10-28 DIAGNOSIS — M4125 Other idiopathic scoliosis, thoracolumbar region: Secondary | ICD-10-CM

## 2020-10-28 DIAGNOSIS — Z9181 History of falling: Secondary | ICD-10-CM

## 2020-10-28 DIAGNOSIS — M6281 Muscle weakness (generalized): Secondary | ICD-10-CM

## 2020-10-28 DIAGNOSIS — R293 Abnormal posture: Secondary | ICD-10-CM

## 2020-10-28 NOTE — Therapy (Signed)
[facility] [address]  [address] [place], [place], [address] Phone: [phone]   Fax:  [phone]  Physical Therapy Treatment  Patient Details  Name: Hailey Hughes. Splitt MRN: [id number] Date of Birth: 04-20-1940 Referring Provider (PT): Loma Sousa, [place]   Encounter Date: 10/28/2020   PT End of Session - 10/28/20 1157     Visit Number 6    Number of Visits 16    Date for PT Re-Evaluation 12/06/20    Authorization Type [employer]    Authorization Time Period 10/13/20 - 12/06/20    Authorization - Visit Number 5    Authorization - Number of Visits 16    PT Start Time 1104    PT Stop Time 1146    PT Time Calculation (min) 42 min    Activity Tolerance Patient tolerated treatment well    Behavior During Therapy WFL for tasks assessed/performed             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   Qualifier: Diagnosis of  By: Nelson-Smith CMA (AAMA), Dottie     HTN (hypertension)    Hyperlipidemia    Hypothyroidism    Imperforate anus    Obesity    OSA (obstructive sleep apnea)    Palpitations    Reflux esophagitis     Past Surgical History:  Procedure Laterality Date   APPENDECTOMY     CARPAL TUNNEL RELEASE Right    COLON RESECTION  1958   age 89   COLOSTOMY  31   age 72 due to imperforate anus   CYSTECTOMY     spinal   LAPAROTOMY N/A 07/23/2018   Procedure: TOTAL ABDOMINAL COLECTOMY, [name], CHOLECYSTECTOMY;  Surgeon: Claud Kelp, MD;  Location: [facility];  Service: General;  Laterality: N/A;   RECTOVAGINAL FISTULA CLOSURE     congenital    There were no vitals filed for this visit.   Subjective Assessment - 10/28/20 1107     Subjective Hasn't noticed a lot of thigh soreness, only noticing soreness in her L shoulder doing exercises.     Patient Stated Goals "to be able to be more erect and have more endurance/stamina"    Currently in Pain? No/denies                               [facility] Adult PT Treatment/Exercise - 10/28/20 0001       Neck Exercises: Machines for Strengthening   UBE (Upper Arm Bike) L1.0 x 6 min (3' fwd/3' back)      Neck Exercises: Theraband   Shoulder Extension 20 reps    Shoulder Extension Limitations yellow TB    Rows 20 reps    Rows Limitations yellow TB      Lumbar Exercises: Stretches   Passive Hamstring Stretch Right;Left;2 reps;30 seconds    Passive Hamstring Stretch Limitations seated with step stool    Hip Flexor Stretch Right;2 reps;20 seconds    Hip Flexor Stretch Limitations seated    ITB Stretch Right;Left;2 reps;30 seconds    ITB Stretch Limitations standing side bend with UE support      Knee/Hip Exercises: Seated   Ball Squeeze 2x10 x 3" + abdominal bracing    Clamshell with TheraBand Yellow  2x10; 3" holds                    PT Education - 10/28/20 1156     Education Details edu on alt hip flexor and HS stretches to reduce HS cramps during hip flexor stretch. Also reviewed ways to do rows/ext exercises at home using doorway.    Person(s) Educated Patient    Methods Explanation;Demonstration    Comprehension Verbalized understanding;Returned demonstration              PT Short Term Goals - 10/25/20 1120       PT SHORT TERM GOAL #1   Title Patient will be independent with initial HEP    Status Achieved   10/25/20              PT Long Term Goals - 10/25/20 1121       PT LONG TERM GOAL #1   Title Patient will be independent with ongoing/advanced HEP for self-management at home    Status On-going    Target Date 12/06/20      PT LONG TERM GOAL #2   Title Improve posture and alignment with patient to demonstrate improved upright posture with posterior shoulder girdle engaged    Status On-going    Target Date 12/06/20       PT LONG TERM GOAL #3   Title Patient to demonstrate symmetrical step length and good heel-toe pattern with upright posture when ambulating with or w/o SPC or LRAD.    Status On-going    Target Date 12/06/20      PT LONG TERM GOAL #4   Title Patient will improve standing and/or walking tolerance to >/= 20 minutes with good posture w/o fatigue or pain interference to allow resumption of normal daily activities    Status On-going    Target Date 12/06/20      PT LONG TERM GOAL #5   Title Patient will demonstrate improved proximal UE/LE strength to >/= 4/5 for available ROM to promote improved stability and ease of mobility    Status On-going    Target Date 12/06/20      PT LONG TERM GOAL #6   Title Patient will demonstrate decreased TUG time to </= 13.5 sec to decrease risk for falls with transitional mobility    Baseline 16.63 sec with cane; 15.47 sec w/o AD    Status On-going    Target Date 12/06/20      PT LONG TERM GOAL #7   Title Patient will improve FGA score to >/= 20/30 to improve gait stability and reduce risk for falls    Baseline 14/30    Status On-going    Target Date 12/06/20                   Plan - 10/28/20 1158     Clinical Impression Statement Pt noticed less pain along the thighs pre session today. She did required modification with the seated hip flexor stretch, noting HS cramps with hip flexor stretch, provided a suggestion to alternate between HS and hip flexor stretches to reduce cramping. No other complaints with the other exercises. Cues given for posture during exercises and core activation to improve alignment of the spine. Pt responded well, needs more work on posture.    Personal Factors and Comorbidities Time since onset of injury/illness/exacerbation;Past/Current Experience;Fitness;Age;Comorbidity 3+;Social Background    Comorbidities Hypotension, HTN, Hirschsprung disease, ileostomy, DM-II, hypothyroidism, anxiety, depression, OSA    PT  Frequency 2x /  week    PT Duration 8 weeks    PT Treatment/Interventions ADLs/Self Care Home Management;Electrical Stimulation;Iontophoresis 4mg /ml Dexamethasone;Moist Heat;Ultrasound;DME Instruction;Gait training;Stair training;Functional mobility training;Therapeutic activities;Therapeutic exercise;Balance training;Neuromuscular re-education;Patient/family education;Manual techniques;Passive range of motion;Dry needling;Taping    PT Next Visit Plan postural and lumopelvic flexibility & strengthening    PT Home Exercise Plan Access Code: (8/30, updated 9/1, 9/6 & 9/13)    Consulted and Agree with Plan of Care Patient             Patient will benefit from skilled therapeutic intervention in order to improve the following deficits and impairments:  Abnormal gait, Decreased activity tolerance, Decreased balance, Decreased endurance, Decreased knowledge of precautions, Decreased knowledge of use of DME, Decreased mobility, Decreased range of motion, Decreased safety awareness, Decreased strength, Difficulty walking, Increased fascial restricitons, Increased muscle spasms, Impaired perceived functional ability, Impaired flexibility, Improper body mechanics, Postural dysfunction, Pain, Impaired UE functional use  Visit Diagnosis: Other idiopathic scoliosis, thoracolumbar region  Abnormal posture  Muscle weakness (generalized)  Other abnormalities of gait and mobility  History of falling     Problem List Patient Active Problem List   Diagnosis Date Noted   Abdominal fluid collection 08/08/2018   Hyperphosphatemia 08/07/2018   Moderate protein malnutrition (HCC) 08/06/2018   Pressure injury of skin 08/05/2018   Acute respiratory failure (HCC)    Sepsis (HCC)    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 07/22/2018   Hypotension    Elevated lactic acid level    CKD (chronic kidney disease) stage 3, GFR 30-59 ml/min (HCC) 12/30/2017   Mild episode of  recurrent major depressive disorder (HCC) 12/30/2017   Anxiety 04/07/2015   Diabetes (HCC) 04/07/2015   Hyperlipidemia 04/07/2015   DYSPEPSIA 05/26/2008   Epigastric pain 05/26/2008   Hypothyroidism 05/21/2008   DIVERTICULOSIS, COLON 05/21/2008   Constipation 05/21/2008   Stenosis of rectum and anus 05/21/2008   FIBROSITIS 05/21/2008   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  [address] [place], [place], [place] Phone: [phone]   Fax:  [phone]  Name: Hailey Hughes. Dagher MRN: [id number] Date of Birth: [birth year]

## 2020-11-01 ENCOUNTER — Other Ambulatory Visit: Payer: Medicare PPO

## 2020-11-01 ENCOUNTER — Ambulatory Visit: Payer: Medicare PPO

## 2020-11-04 ENCOUNTER — Other Ambulatory Visit: Payer: Self-pay

## 2020-11-04 ENCOUNTER — Ambulatory Visit: Payer: Medicare PPO

## 2020-11-04 DIAGNOSIS — R2689 Other abnormalities of gait and mobility: Secondary | ICD-10-CM

## 2020-11-04 DIAGNOSIS — M4125 Other idiopathic scoliosis, thoracolumbar region: Secondary | ICD-10-CM | POA: Diagnosis not present

## 2020-11-04 DIAGNOSIS — M6281 Muscle weakness (generalized): Secondary | ICD-10-CM

## 2020-11-04 DIAGNOSIS — Z9181 History of falling: Secondary | ICD-10-CM

## 2020-11-04 DIAGNOSIS — R293 Abnormal posture: Secondary | ICD-10-CM

## 2020-11-04 NOTE — Therapy (Signed)
[facility]patient [facility] [address]  [address] [place], [place], [address] Phone: [phone]   Fax:  [phone]  Physical Therapy Treatment  Patient Details  Name: Hailey Hughes. Fonte MRN: [id number] Date of Birth: 1940/04/05 Referring Provider (PT): Loma Sousa, [place]   Encounter Date: 11/04/2020   PT End of Session - 11/04/20 1151     Visit Number 7    Number of Visits 16    Date for PT Re-Evaluation 12/06/20    Authorization Type [employer]    Authorization Time Period 10/13/20 - 12/06/20    Authorization - Visit Number 6    Authorization - Number of Visits 16    PT Start Time 1103    PT Stop Time 1145    PT Time Calculation (min) 42 min    Activity Tolerance Patient tolerated treatment well;Patient limited by fatigue    Behavior During Therapy [facility] for tasks assessed/performed             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   Qualifier: Diagnosis of  By: Nelson-Smith CMA (AAMA), Dottie     HTN (hypertension)    Hyperlipidemia    Hypothyroidism    Imperforate anus    Obesity    OSA (obstructive sleep apnea)    Palpitations    Reflux esophagitis     Past Surgical History:  Procedure Laterality Date   APPENDECTOMY     CARPAL TUNNEL RELEASE Right    COLON RESECTION  1958   age 23   COLOSTOMY  57   age 32 due to imperforate anus   CYSTECTOMY     spinal   LAPAROTOMY N/A 07/23/2018   Procedure: TOTAL ABDOMINAL COLECTOMY, [name], CHOLECYSTECTOMY;  Surgeon: Claud Kelp, MD;  Location: [facility];  Service: General;  Laterality: N/A;   RECTOVAGINAL FISTULA CLOSURE     congenital    There were no vitals filed for this visit.   Subjective Assessment - 11/04/20 1106     Subjective Still feels some cramps with the hip flexor stretches.    Patient Stated  Goals "to be able to be more erect and have more endurance/stamina"    Currently in Pain? Yes    Pain Score 1     Pain Location Neck   and shoulders   Pain Orientation Right;Left;Upper    Pain Descriptors / Indicators Dull    Pain Type Chronic pain                               [facility] Adult PT Treatment/Exercise - 11/04/20 0001       Neck Exercises: Machines for Strengthening   Nustep L3 x 6 min (UE/LE)      Neck Exercises: Theraband   Shoulder External Rotation 10 reps    Shoulder External Rotation Limitations yellow TB      Neck Exercises: Seated   Other Seated Exercise upper trunk rotations 10 reps      Lumbar Exercises: Stretches   Standing Extension Limitations 2x10      Lumbar Exercises: Standing   Other Standing Lumbar Exercises fwd stepping with opposite arm raise 2x10 with counter support    Other Standing Lumbar Exercises opposite arm raises with marches 10 reps and counter support  Shoulder Exercises: Seated   Flexion Both;10 reps;AROM    Flexion Limitations with cervical extension      Shoulder Exercises: Standing   Extension Strengthening;Both;20 reps;Theraband    Theraband Level (Shoulder Extension) Level 1 (Yellow)    Row Strengthening;Both;20 reps;Theraband    Theraband Level (Shoulder Row) Level 1 (Yellow)                       PT Short Term Goals - 10/25/20 1120       PT SHORT TERM GOAL #1   Title Patient will be independent with initial HEP    Status Achieved   10/25/20              PT Long Term Goals - 10/25/20 1121       PT LONG TERM GOAL #1   Title Patient will be independent with ongoing/advanced HEP for self-management at home    Status On-going    Target Date 12/06/20      PT LONG TERM GOAL #2   Title Improve posture and alignment with patient to demonstrate improved upright posture with posterior shoulder girdle engaged    Status On-going    Target Date 12/06/20      PT LONG TERM GOAL #3    Title Patient to demonstrate symmetrical step length and good heel-toe pattern with upright posture when ambulating with or w/o SPC or LRAD.    Status On-going    Target Date 12/06/20      PT LONG TERM GOAL #4   Title Patient will improve standing and/or walking tolerance to >/= 20 minutes with good posture w/o fatigue or pain interference to allow resumption of normal daily activities    Status On-going    Target Date 12/06/20      PT LONG TERM GOAL #5   Title Patient will demonstrate improved proximal UE/LE strength to >/= 4/5 for available ROM to promote improved stability and ease of mobility    Status On-going    Target Date 12/06/20      PT LONG TERM GOAL #6   Title Patient will demonstrate decreased TUG time to </= 13.5 sec to decrease risk for falls with transitional mobility    Baseline 16.63 sec with cane; 15.47 sec w/o AD    Status On-going    Target Date 12/06/20      PT LONG TERM GOAL #7   Title Patient will improve FGA score to >/= 20/30 to improve gait stability and reduce risk for falls    Baseline 14/30    Status On-going    Target Date 12/06/20                   Plan - 11/04/20 1151     Clinical Impression Statement Mrs Mcgann fairly tolerated the exercises today. She demonstrated more fatigue requiring some seated breaks with the dynamic exercises with UEs and LEs. Cues needed for her to extend her neck with the exercises today in order to improve posture and decrease strain on the neck muscles. Focused on exercises that incorporated more arm movement to facilitate more cervical motion improving posture. She would continue to benefit from more thoracic mobility and dynamic UE exercises to improve postural alignment.    Personal Factors and Comorbidities Time since onset of injury/illness/exacerbation;Past/Current Experience;Fitness;Age;Comorbidity 3+;Social Background    Comorbidities Hypotension, HTN, Hirschsprung disease, ileostomy, DM-II,  hypothyroidism, anxiety, depression, OSA    PT Frequency 2x / week    PT  Duration 8 weeks    PT Treatment/Interventions ADLs/Self Care Home Management;Electrical Stimulation;Iontophoresis 4mg /ml Dexamethasone;Moist Heat;Ultrasound;DME Instruction;Gait training;Stair training;Functional mobility training;Therapeutic activities;Therapeutic exercise;Balance training;Neuromuscular re-education;Patient/family education;Manual techniques;Passive range of motion;Dry needling;Taping    PT Next Visit Plan postural and lumopelvic flexibility & strengthening    Consulted and Agree with Plan of Care Patient             Patient will benefit from skilled therapeutic intervention in order to improve the following deficits and impairments:  Abnormal gait, Decreased activity tolerance, Decreased balance, Decreased endurance, Decreased knowledge of precautions, Decreased knowledge of use of DME, Decreased mobility, Decreased range of motion, Decreased safety awareness, Decreased strength, Difficulty walking, Increased fascial restricitons, Increased muscle spasms, Impaired perceived functional ability, Impaired flexibility, Improper body mechanics, Postural dysfunction, Pain, Impaired UE functional use  Visit Diagnosis: Other idiopathic scoliosis, thoracolumbar region  Abnormal posture  Muscle weakness (generalized)  Other abnormalities of gait and mobility  History of falling     Problem List Patient Active Problem List   Diagnosis Date Noted   Abdominal fluid collection 08/08/2018   Hyperphosphatemia 08/07/2018   Moderate protein malnutrition (HCC) 08/06/2018   Pressure injury of skin 08/05/2018   Acute respiratory failure (HCC)    Sepsis (HCC)    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 07/22/2018   Hypotension    Elevated lactic acid level    CKD (chronic kidney disease) stage 3, GFR 30-59 ml/min (HCC) 12/30/2017   Mild episode of recurrent major depressive disorder  (HCC) 12/30/2017   Anxiety 04/07/2015   Diabetes (HCC) 04/07/2015   Hyperlipidemia 04/07/2015   DYSPEPSIA 05/26/2008   Epigastric pain 05/26/2008   Hypothyroidism 05/21/2008   DIVERTICULOSIS, COLON 05/21/2008   Constipation 05/21/2008   Stenosis of rectum and anus 05/21/2008   FIBROSITIS 05/21/2008   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  [address] [place], [place], [place] Phone: [phone]   Fax:  [phone]  Name: Dazja Houchin. Rosenow MRN: Larina Bras Date of Birth: 10-08-1940

## 2020-11-08 ENCOUNTER — Ambulatory Visit: Payer: Medicare PPO

## 2020-11-08 ENCOUNTER — Other Ambulatory Visit: Payer: Self-pay

## 2020-11-08 DIAGNOSIS — Z9181 History of falling: Secondary | ICD-10-CM

## 2020-11-08 DIAGNOSIS — M4125 Other idiopathic scoliosis, thoracolumbar region: Secondary | ICD-10-CM | POA: Diagnosis not present

## 2020-11-08 DIAGNOSIS — M6281 Muscle weakness (generalized): Secondary | ICD-10-CM

## 2020-11-08 DIAGNOSIS — R2689 Other abnormalities of gait and mobility: Secondary | ICD-10-CM

## 2020-11-08 DIAGNOSIS — R293 Abnormal posture: Secondary | ICD-10-CM

## 2020-11-08 NOTE — Therapy (Signed)
[facility] Outpatient Rehabilitation [facility] [address]  [address] [place], [place], [address] Phone: [phone]   Fax:  [phone]  Physical Therapy Treatment  Patient Details  Name: Hailey Hughes. Weigel MRN: [id number] Date of Birth: 02-Apr-1940 Referring Provider (PT): Loma Sousa, [place]   Encounter Date: 11/08/2020   PT End of Session - 11/08/20 1205     Visit Number 8    Number of Visits 16    Date for PT Re-Evaluation 12/06/20    Authorization Type [employer]    Authorization Time Period 10/13/20 - 12/06/20    Authorization - Visit Number 7    Authorization - Number of Visits 16    PT Start Time 1103    PT Stop Time 1142    PT Time Calculation (min) 39 min    Activity Tolerance Patient tolerated treatment well    Behavior During Therapy WFL for tasks assessed/performed             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   Qualifier: Diagnosis of  By: Nelson-Smith CMA (AAMA), Dottie     HTN (hypertension)    Hyperlipidemia    Hypothyroidism    Imperforate anus    Obesity    OSA (obstructive sleep apnea)    Palpitations    Reflux esophagitis     Past Surgical History:  Procedure Laterality Date   APPENDECTOMY     CARPAL TUNNEL RELEASE Right    COLON RESECTION  1958   age 80   COLOSTOMY  44   age 80 due to imperforate anus   CYSTECTOMY     spinal   LAPAROTOMY N/A 07/23/2018   Procedure: TOTAL ABDOMINAL COLECTOMY, [name], CHOLECYSTECTOMY;  Surgeon: Claud Kelp, MD;  Location: [facility];  Service: General;  Laterality: N/A;   RECTOVAGINAL FISTULA CLOSURE     congenital    There were no vitals filed for this visit.   Subjective Assessment - 11/08/20 1109     Subjective Pt notes some pain in her hip and knee, would like to focus more on posture and UE exercises. Also  would like to reduce frequency of PT or transition to HEP due to copays.    Patient Stated Goals "to be able to be more erect and have more endurance/stamina"    Currently in Pain? Yes    Pain Score 4     Pain Location Hip    Pain Orientation Right;Left    Pain Descriptors / Indicators Aching    Pain Type Chronic pain                               [facility] Adult PT Treatment/Exercise - 11/08/20 0001       Neck Exercises: Machines for Strengthening   Nustep L4 x 6 min (UE/LE)      Lumbar Exercises: Stretches   Passive Hamstring Stretch Limitations reviewed      Shoulder Exercises: Seated   Extension Strengthening;Both;10 reps;Theraband    Theraband Level (Shoulder Extension) Level 1 (Yellow)    Row Strengthening;Both;20 reps;Theraband    Theraband Level (Shoulder Row) Level 1 (Yellow)    Horizontal ABduction Limitations reviewed    External Rotation Limitations reviewed  PT Education - 11/08/20 1204     Education Details HEP review, updates, education on important exercises to focus on, possibly reducing frequency of PT or 30 day hold if able.    Person(s) Educated Patient    Methods Explanation;Demonstration;Handout    Comprehension Verbalized understanding;Returned demonstration;Need further instruction              PT Short Term Goals - 10/25/20 1120       PT SHORT TERM GOAL #1   Title Patient will be independent with initial HEP    Status Achieved   10/25/20              PT Long Term Goals - 10/25/20 1121       PT LONG TERM GOAL #1   Title Patient will be independent with ongoing/advanced HEP for self-management at home    Status On-going    Target Date 12/06/20      PT LONG TERM GOAL #2   Title Improve posture and alignment with patient to demonstrate improved upright posture with posterior shoulder girdle engaged    Status On-going    Target Date 12/06/20      PT LONG TERM GOAL #3   Title Patient  to demonstrate symmetrical step length and good heel-toe pattern with upright posture when ambulating with or w/o SPC or LRAD.    Status On-going    Target Date 12/06/20      PT LONG TERM GOAL #4   Title Patient will improve standing and/or walking tolerance to >/= 20 minutes with good posture w/o fatigue or pain interference to allow resumption of normal daily activities    Status On-going    Target Date 12/06/20      PT LONG TERM GOAL #5   Title Patient will demonstrate improved proximal UE/LE strength to >/= 4/5 for available ROM to promote improved stability and ease of mobility    Status On-going    Target Date 12/06/20      PT LONG TERM GOAL #6   Title Patient will demonstrate decreased TUG time to </= 13.5 sec to decrease risk for falls with transitional mobility    Baseline 16.63 sec with cane; 15.47 sec w/o AD    Status On-going    Target Date 12/06/20      PT LONG TERM GOAL #7   Title Patient will improve FGA score to >/= 20/30 to improve gait stability and reduce risk for falls    Baseline 14/30    Status On-going    Target Date 12/06/20                   Plan - 11/08/20 1206     Clinical Impression Statement Focused session on updating HEP and reviewing exercises due to pt reporting that she would like to transition to HEP due to her copays. I introduced the idea of reducing to 1x per week but will discuss this further next session with PT. Added scap stab with TB today to improve posture, provided instructions as needed. She also reported that she would like to focus more on posture rather than UE and LE exercises. She would continue to benefit from postural exercises to improve functional mobility of her UEs.    Personal Factors and Comorbidities Time since onset of injury/illness/exacerbation;Past/Current Experience;Fitness;Age;Comorbidity 3+;Social Background    Comorbidities Hypotension, HTN, Hirschsprung disease, ileostomy, DM-II, hypothyroidism, anxiety,  depression, OSA    PT Frequency 2x / week    PT Duration 8  weeks    PT Treatment/Interventions ADLs/Self Care Home Management;Electrical Stimulation;Iontophoresis 4mg /ml Dexamethasone;Moist Heat;Ultrasound;DME Instruction;Gait training;Stair training;Functional mobility training;Therapeutic activities;Therapeutic exercise;Balance training;Neuromuscular re-education;Patient/family education;Manual techniques;Passive range of motion;Dry needling;Taping    PT Next Visit Plan postural and lumopelvic flexibility & strengthening    PT Home Exercise Plan Access Code: (8/30, updated 9/1, 9/6 & 9/13)    Consulted and Agree with Plan of Care Patient             Patient will benefit from skilled therapeutic intervention in order to improve the following deficits and impairments:  Abnormal gait, Decreased activity tolerance, Decreased balance, Decreased endurance, Decreased knowledge of precautions, Decreased knowledge of use of DME, Decreased mobility, Decreased range of motion, Decreased safety awareness, Decreased strength, Difficulty walking, Increased fascial restricitons, Increased muscle spasms, Impaired perceived functional ability, Impaired flexibility, Improper body mechanics, Postural dysfunction, Pain, Impaired UE functional use  Visit Diagnosis: Other idiopathic scoliosis, thoracolumbar region  Abnormal posture  Muscle weakness (generalized)  Other abnormalities of gait and mobility  History of falling     Problem List Patient Active Problem List   Diagnosis Date Noted   Abdominal fluid collection 08/08/2018   Hyperphosphatemia 08/07/2018   Moderate protein malnutrition (HCC) 08/06/2018   Pressure injury of skin 08/05/2018   Acute respiratory failure (HCC)    Sepsis (HCC)    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 07/22/2018   Hypotension    Elevated lactic acid level    CKD (chronic kidney disease) stage 3, GFR 30-59 ml/min (HCC) 12/30/2017    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   Anxiety 04/07/2015   Diabetes (HCC) 04/07/2015   Hyperlipidemia 04/07/2015   DYSPEPSIA 05/26/2008   Epigastric pain 05/26/2008   Hypothyroidism 05/21/2008   DIVERTICULOSIS, COLON 05/21/2008   Constipation 05/21/2008   Stenosis of rectum and anus 05/21/2008   FIBROSITIS 05/21/2008   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  [address] [place], [place], [place] Phone: [phone]   Fax:  [phone]  Name: Harneet Noblett. Pinkstaff MRN: Larina Bras Date of Birth: 1940/03/24

## 2020-11-08 NOTE — Patient Instructions (Signed)
Access Code: [email or web address] URL: [email or web address] Date: 11/08/2020 Prepared by: Verta Ellen  Exercises Supine Pectoralis Stretch - 2 x daily - 7 x weekly - 3 reps - 30 sec hold Seated Cervical Retraction - 2 x daily - 7 x weekly - 2 sets - 10 reps - 3-5 sec hold Seated Thoracic Lumbar Extension - 1 x daily - 7 x weekly - 2 sets - 10 reps - 5 sec hold Seated Shoulder External Rotation - 1 x daily - 7 x weekly - 2 sets - 10 reps - 3 sec hold Seated Shoulder Horizontal Abduction - Thumbs Up - 1 x daily - 7 x weekly - 2 sets - 10 reps - 3 sec hold Standing Pec Stretch at Wall - 2-3 x daily - 7 x weekly - 3 reps - 30 sec hold Seated Hamstring Stretch - 2-3 x daily - 7 x weekly - 3 reps - 30 sec hold Seated Pelvic Floor Contraction with Isometric Hip Adduction - 1 x daily - 7 x weekly - 2 sets - 10 reps - 3 sec hold Seated Shoulder Row with Anchored Resistance - 1 x daily - 7 x weekly - 3 sets - 10 reps Seated Shoulder Extension and Scapular Retraction with Resistance - 1 x daily - 7 x weekly - 3 sets - 10 reps

## 2020-11-11 ENCOUNTER — Ambulatory Visit: Payer: Medicare PPO | Admitting: Physical Therapy

## 2020-11-16 ENCOUNTER — Ambulatory Visit: Payer: Medicare PPO | Attending: Physician Assistant

## 2020-11-16 ENCOUNTER — Other Ambulatory Visit: Payer: Self-pay

## 2020-11-16 DIAGNOSIS — M6281 Muscle weakness (generalized): Secondary | ICD-10-CM | POA: Diagnosis present

## 2020-11-16 DIAGNOSIS — R2689 Other abnormalities of gait and mobility: Secondary | ICD-10-CM | POA: Diagnosis present

## 2020-11-16 DIAGNOSIS — Z9181 History of falling: Secondary | ICD-10-CM | POA: Insufficient documentation

## 2020-11-16 DIAGNOSIS — R293 Abnormal posture: Secondary | ICD-10-CM | POA: Diagnosis present

## 2020-11-16 DIAGNOSIS — M4125 Other idiopathic scoliosis, thoracolumbar region: Secondary | ICD-10-CM | POA: Insufficient documentation

## 2020-11-16 NOTE — Therapy (Signed)
[facility] [address]  [place], [place], [address] Phone: [phone]   Fax:  [phone]  Physical Therapy Treatment  Patient Details  Name: Hailey Hughes MRN: [id number] Date of Birth: 05-23-1940 Referring Provider (PT): Domingo Pulse, [place]   Encounter Date: 11/16/2020   PT End of Session - 11/16/20 1020     Visit Number 9    Number of Visits 16    Date for PT Re-Evaluation 12/06/20    Authorization Type [employer]    Authorization Time Period 10/13/20 - 12/06/20    Authorization - Visit Number 8    Authorization - Number of Visits 16    PT Start Time 0933    PT Stop Time 1016    PT Time Calculation (min) 43 min    Activity Tolerance Patient tolerated treatment well    Behavior During Therapy WFL for tasks assessed/performed             Past Medical History:  Diagnosis Date   Depression    Diverticulosis    DM2 (diabetes mellitus, type 2) ([place])    Hiatal hernia    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   Qualifier: Diagnosis of  By: Nelson-Smith CMA (AAMA), Dottie     HTN (hypertension)    Hyperlipidemia    Hypothyroidism    Imperforate anus    Obesity    OSA (obstructive sleep apnea)    Palpitations    Reflux esophagitis     Past Surgical History:  Procedure Laterality Date   APPENDECTOMY     CARPAL TUNNEL RELEASE Right    COLON RESECTION  1958   age 48   COLOSTOMY  32   age 43 due to imperforate anus   CYSTECTOMY     spinal   LAPAROTOMY N/A 07/23/2018   Procedure: TOTAL ABDOMINAL COLECTOMY, [name], CHOLECYSTECTOMY;  Surgeon: Fanny Skates, MD;  Location: [facility];  Service: General;  Laterality: N/A;   RECTOVAGINAL FISTULA CLOSURE     congenital    There were no vitals filed for this visit.   Subjective Assessment - 11/16/20 0936     Subjective Pt still feels that she wants to finish PT early and wants to know if once a day is good enough for her  exercises.    Patient Stated Goals "to be able to be more erect and have more endurance/stamina"    Currently in Pain? No/denies                [facility] PT Assessment - 11/16/20 0001       Strength   Right Shoulder Flexion 4-/5    Right Shoulder ABduction 4-/5    Right Shoulder Internal Rotation 3+/5    Right Shoulder External Rotation 3+/5    Left Shoulder Flexion 4/5    Left Shoulder ABduction 4/5    Left Shoulder Internal Rotation 3+/5    Left Shoulder External Rotation 3+/5    Right Hip Flexion 4+/5    Right Hip ABduction 4+/5    Right Hip ADduction 4+/5    Left Hip Flexion 4+/5    Left Hip ABduction 4+/5    Left Hip ADduction 4+/5    Right Knee Flexion 5/5    Right Knee Extension 5/5    Left Knee Flexion 5/5    Left Knee Extension 4+/5  [facility] PT Treatment/Exercise - 11/16/20 0001       Neck Exercises: Machines for Strengthening   UBE (Upper Arm Bike) L2.0 x 6 min (3' fwd/3' back)      Neck Exercises: Theraband   Shoulder External Rotation 10 reps    Shoulder External Rotation Limitations yellow TB    Horizontal ABduction 10 reps    Horizontal ABduction Limitations yellow TB      Neck Exercises: Seated   Neck Retraction 10 reps;5 secs    Other Seated Exercise thoracic extension over chair 10x3"      Neck Exercises: Stretches   Chest Stretch 2 reps;30 seconds    Chest Stretch Limitations each arm      Knee/Hip Exercises: Seated   Ball Squeeze reviewed                       PT Short Term Goals - 10/25/20 1120       PT SHORT TERM GOAL #1   Title Patient will be independent with initial HEP    Status Achieved   10/25/20              PT Long Term Goals - 11/16/20 0947       PT LONG TERM GOAL #1   Title Patient will be independent with ongoing/advanced HEP for self-management at home    Status On-going      PT LONG TERM GOAL #2   Title Improve posture and alignment with patient to  demonstrate improved upright posture with posterior shoulder girdle engaged    Status On-going      PT LONG TERM GOAL #3   Title Patient to demonstrate symmetrical step length and good heel-toe pattern with upright posture when ambulating with or w/o SPC or LRAD.    Status On-going      PT LONG TERM GOAL #4   Title Patient will improve standing and/or walking tolerance to >/= 20 minutes with good posture w/o fatigue or pain interference to allow resumption of normal daily activities    Status On-going   pt reports that she feels like she has more endurance and requires less seated breaks     PT LONG TERM GOAL #5   Title Patient will demonstrate improved proximal UE/LE strength to >/= 4/5 for available ROM to promote improved stability and ease of mobility    Status Partially Met   met for LEs     PT LONG TERM GOAL #6   Title Patient will demonstrate decreased TUG time to </= 13.5 sec to decrease risk for falls with transitional mobility    Baseline 16.63 sec with cane; 15.47 sec w/o AD    Status On-going      PT LONG TERM GOAL #7   Title Patient will improve FGA score to >/= 20/30 to improve gait stability and reduce risk for falls    Baseline 14/30    Status On-going                   Plan - 11/16/20 1021     Clinical Impression Statement Pt has met her strength goal for her LEs but UE strength is still considerably limited. She noted today that she feels that she has more endurance when she is out walking but could not give a specific time frame for when she would require a seated break. Reviewed the home exercises that she had the most trouble with today, progressed seated ER and horizontal  abduction with yellow TB and added these exercises to HEP. Clarified the seated hip adduction for her to ensure she is targeting the right muscles. She still wishes to wrap up PT early as of now, so we have been fine tuning her HEP but next visit will determine where to go going forward.     Personal Factors and Comorbidities Time since onset of injury/illness/exacerbation;Past/Current Experience;Fitness;Age;Comorbidity 3+;Social Background    Comorbidities Hypotension, HTN, Hirschsprung disease, ileostomy, DM-II, hypothyroidism, anxiety, depression, OSA    PT Frequency 2x / week    PT Duration 8 weeks    PT Treatment/Interventions ADLs/Self Care Home Management;Electrical Stimulation;Iontophoresis 73m/ml Dexamethasone;Moist Heat;Ultrasound;DME Instruction;Gait training;Stair training;Functional mobility training;Therapeutic activities;Therapeutic exercise;Balance training;Neuromuscular re-education;Patient/family education;Manual techniques;Passive range of motion;Dry needling;Taping    PT Next Visit Plan determine if 30 day hold or 1x per week; postural and lumopelvic flexibility & strengthening    PT Home Exercise Plan Access Code: [email or web address](8/30, updated 9/1, 9/6 & 9/13)    Consulted and Agree with Plan of Care Patient             Patient will benefit from skilled therapeutic intervention in order to improve the following deficits and impairments:  Abnormal gait, Decreased activity tolerance, Decreased balance, Decreased endurance, Decreased knowledge of precautions, Decreased knowledge of use of DME, Decreased mobility, Decreased range of motion, Decreased safety awareness, Decreased strength, Difficulty walking, Increased fascial restricitons, Increased muscle spasms, Impaired perceived functional ability, Impaired flexibility, Improper body mechanics, Postural dysfunction, Pain, Impaired UE functional use  Visit Diagnosis: Other idiopathic scoliosis, thoracolumbar region  Abnormal posture  Muscle weakness (generalized)  Other abnormalities of gait and mobility  History of falling     Problem List Patient Active Problem List   Diagnosis Date Noted   Abdominal fluid collection 08/08/2018   Hyperphosphatemia 08/07/2018   Moderate protein malnutrition ([place]  08/06/2018   Pressure injury of skin 08/05/2018   Acute respiratory failure ([facility])    Sepsis ([place]    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 07/22/2018   Hypotension    Elevated lactic acid level    CKD (chronic kidney disease) stage 3, GFR 30-59 ml/min (HCC) 12/30/2017   Mild episode of recurrent major depressive disorder ([place] 12/30/2017   Anxiety 04/07/2015   Diabetes ([place] 04/07/2015   Hyperlipidemia 04/07/2015   DYSPEPSIA 05/26/2008   Epigastric pain 05/26/2008   Hypothyroidism 05/21/2008   DIVERTICULOSIS, COLON 05/21/2008   Constipation 05/21/2008   Stenosis of rectum and anus 05/21/2008   FIBROSITIS 05/21/2008   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 [place] [address]Phone: [phone]  Fax:  [phone] Name: Hailey Hughes MRN: [id number] of Birth: [birth year]

## 2020-11-22 ENCOUNTER — Other Ambulatory Visit: Payer: Self-pay

## 2020-11-22 ENCOUNTER — Encounter: Payer: Self-pay | Admitting: Physical Therapy

## 2020-11-22 ENCOUNTER — Ambulatory Visit: Payer: Medicare PPO | Admitting: Physical Therapy

## 2020-11-22 DIAGNOSIS — R293 Abnormal posture: Secondary | ICD-10-CM

## 2020-11-22 DIAGNOSIS — M4125 Other idiopathic scoliosis, thoracolumbar region: Secondary | ICD-10-CM | POA: Diagnosis not present

## 2020-11-22 DIAGNOSIS — R2689 Other abnormalities of gait and mobility: Secondary | ICD-10-CM

## 2020-11-22 DIAGNOSIS — M6281 Muscle weakness (generalized): Secondary | ICD-10-CM

## 2020-11-22 DIAGNOSIS — Z9181 History of falling: Secondary | ICD-10-CM

## 2020-11-22 NOTE — Therapy (Signed)
[facility] [address]  [place], [place], [address] Phone: [phone]   Fax:  [phone]  Physical Therapy Treatment/Progress Note  Progress Note Reporting Period 10/11/2020 to 11/22/2020  See note below for Objective Data and Assessment of Progress/Goals.     Patient Details  Name: Hailey Hughes. Hailey Hughes MRN: [id number] Date of Birth: 13-Feb-1940 Referring Provider (PT): Hailey Hughes, [place]   Encounter Date: 11/22/2020   PT End of Session - 11/22/20 1034     Visit Number 10    Number of Visits 16    Date for PT Re-Evaluation 12/06/20    Authorization Type [employer]    Authorization Time Period 10/13/20 - 12/06/20    Authorization - Visit Number 8    Authorization - Number of Visits 16    PT Start Time 8891    PT Stop Time 1100    PT Time Calculation (min) 30 min    Activity Tolerance Patient tolerated treatment well    Behavior During Therapy WFL for tasks assessed/performed             Past Medical History:  Diagnosis Date   Depression    Diverticulosis    DM2 (diabetes mellitus, type 2) ([place])    Hiatal hernia    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   Qualifier: Diagnosis of  By: Hailey Hughes     HTN (hypertension)    Hyperlipidemia    Hypothyroidism    Imperforate anus    Obesity    OSA (obstructive sleep apnea)    Palpitations    Reflux esophagitis     Past Surgical History:  Procedure Laterality Date   APPENDECTOMY     CARPAL TUNNEL RELEASE Right    COLON RESECTION  1958   age 60   COLOSTOMY  3   age 16 due to imperforate anus   CYSTECTOMY     spinal   LAPAROTOMY N/A 07/23/2018   Procedure: TOTAL ABDOMINAL COLECTOMY, [name], CHOLECYSTECTOMY;  Surgeon: Hailey Skates, MD;  Location: [facility];  Service: General;  Laterality: N/A;   RECTOVAGINAL FISTULA CLOSURE     congenital    There were no vitals filed for this visit.    Subjective Assessment - 11/22/20 1033     Subjective Pt. got her time mixed up and arrived 15 min late.  Reports she would still like to decrease visits to 1x/week.    Patient Stated Goals "to be able to be more erect and have more endurance/stamina"    Currently in Pain? No/denies                [facility] PT Assessment - 11/22/20 0001       Assessment   Medical Diagnosis Scoliosis    Referring Provider (PT) Hailey Pulse, PA-C    Hand Dominance Right      Precautions   Precautions Fall      Strength   Right Shoulder Flexion 4-/5    Right Shoulder ABduction 4-/5    Right Shoulder Internal Rotation 3+/5    Right Shoulder External Rotation 3+/5    Left Shoulder Flexion 4/5    Left Shoulder ABduction 4/5    Left Shoulder Internal Rotation 3+/5    Left Shoulder External Rotation 3+/5    Right Hip Flexion 4+/5    Right Hip ABduction 4+/5    Right Hip ADduction 4+/5    Left Hip Flexion 4+/5    Left Hip ABduction  4+/5    Left Hip ADduction 4+/5    Right Knee Flexion 5/5    Right Knee Extension 5/5    Left Knee Flexion 5/5    Left Knee Extension 4+/5      Timed Up and Go Test   TUG Normal TUG    Normal TUG (seconds) 12.5   with AD, without AD 12.96     Functional Gait  Assessment   Gait assessed  Yes    Gait Level Surface Walks 20 ft, slow speed, abnormal gait pattern, evidence for imbalance or deviates 10-15 in outside of the 12 in walkway width. Requires more than 7 sec to ambulate 20 ft.    Change in Gait Speed Able to change speed, demonstrates mild gait deviations, deviates 6-10 in outside of the 12 in walkway width, or no gait deviations, unable to achieve a major change in velocity, or uses a change in velocity, or uses an assistive device.    Gait with Horizontal Head Turns Performs head turns smoothly with slight change in gait velocity (eg, minor disruption to smooth gait path), deviates 6-10 in outside 12 in walkway width, or uses an assistive device.    Gait  with Vertical Head Turns Performs task with slight change in gait velocity (eg, minor disruption to smooth gait path), deviates 6 - 10 in outside 12 in walkway width or uses assistive device    Gait and Pivot Turn Pivot turns safely within 3 sec and stops quickly with no loss of balance.    Step Over Obstacle Is able to step over one shoe box (4.5 in total height) but must slow down and adjust steps to clear box safely. May require verbal cueing.    Gait with Narrow Base of Support Ambulates 4-7 steps.    Gait with Eyes Closed Cannot walk 20 ft without assistance, severe gait deviations or imbalance, deviates greater than 15 in outside 12 in walkway width or will not attempt task.    Ambulating Backwards Walks 20 ft, slow speed, abnormal gait pattern, evidence for imbalance, deviates 10-15 in outside 12 in walkway width.    Steps Two feet to a stair, must use rail.    Total Score 14    FGA comment: < 19 = high risk fall                           [facility] Adult PT Treatment/Exercise - 11/22/20 0001       Neck Exercises: Machines for Strengthening   Nustep L4 x 4 min      Knee/Hip Exercises: Seated   Sit to Sand 5 reps;without UE support   no pain today, added to HEP                      PT Short Term Goals - 10/25/20 1120       PT SHORT TERM GOAL #1   Title Patient will be independent with initial HEP    Status Achieved   10/25/20              PT Long Term Goals - 11/22/20 1036       PT LONG TERM GOAL #1   Title Patient will be independent with ongoing/advanced HEP for self-management at home    Status On-going   11/22/20- met for current   Target Date 12/06/20      PT LONG TERM GOAL #2   Title Improve  posture and alignment with patient to demonstrate improved upright posture with posterior shoulder girdle engaged    Status On-going   11/22/20- pt. reports better awareness of posture     PT LONG TERM GOAL #3   Title Patient to demonstrate  symmetrical step length and good heel-toe pattern with upright posture when ambulating with or w/o SPC or LRAD.    Status On-going   11/22/20- reports improved posture when walking with [facility]   Target Date 12/06/20      PT LONG TERM GOAL #4   Title Patient will improve standing and/or walking tolerance to >/= 20 minutes with good posture w/o fatigue or pain interference to allow resumption of normal daily activities    Status On-going   11/22/20- patient still fatigues quickly with walking but reports decreased recovery time and improved standing tolerance.   Target Date 12/06/20      PT LONG TERM GOAL #5   Title Patient will demonstrate improved proximal UE/LE strength to >/= 4/5 for available ROM to promote improved stability and ease of mobility    Status Partially Met   11/22/20- see flowsheet, met for LEs   Target Date 12/06/20      PT LONG TERM GOAL #6   Title Patient will demonstrate decreased TUG time to </= 13.5 sec to decrease risk for falls with transitional mobility    Baseline 16.63 sec with cane; 15.47 sec w/o AD    Status Achieved   11/22/20- 12.5 sec with cane, 12.96 sec without AD     PT LONG TERM GOAL #7   Title Patient will improve FGA score to >/= 20/30 to improve gait stability and reduce risk for falls    Baseline 14/30    Status On-going   11/22/20- no change, 14/30   Target Date 12/06/20                   Plan - 11/22/20 1035     Clinical Impression Statement Patient reports overall improvement in endurance, reporting she can stand and walk longer, and does not need as much recovery time when she does need to sit down.  She would like to decrease her visits to 1x/week.  Today reassessed balance, she met her TUG goal, able to complete in 12.5 sec with SPC and 12.96 sec without AD, demonstrating decreased fall risk.  However her score on the FGA has not changed, and she was very challenged to perform head movements with gait.  Discussed today that she is  still at increased risk of falls based on her FGA and she needs to continue using an AD especially with community ambulation.  She feels her HEP is adequate, but recommended adding repeated sit to stands to her HEP and gradually increasing number as endurance improves, which she agreed to.  Also discussed importance of LE strength and balance.  She would benefit from continued skilled therapy to improve functional strength, safety and decrease risk of falls.    Personal Factors and Comorbidities Time since onset of injury/illness/exacerbation;Past/Current Experience;Fitness;Age;Comorbidity 3+;Social Background    Comorbidities Hypotension, HTN, Hirschsprung disease, ileostomy, DM-II, hypothyroidism, anxiety, depression, OSA    PT Frequency 2x / week    PT Duration 8 weeks    PT Treatment/Interventions ADLs/Self Care Home Management;Electrical Stimulation;Iontophoresis 83m/ml Dexamethasone;Moist Heat;Ultrasound;DME Instruction;Gait training;Stair training;Functional mobility training;Therapeutic activities;Therapeutic exercise;Balance training;Neuromuscular re-education;Patient/family education;Manual techniques;Passive range of motion;Dry needling;Taping    PT Next Visit Plan balance training (head turns and stepping strategy); postural and lumopelvic flexibility &  strengthening    PT Home Exercise Plan Access Code: [email or web address] (8/30, updated 9/1, 9/6 & 9/13)    Consulted and Agree with Plan of Care Patient             Patient will benefit from skilled therapeutic intervention in order to improve the following deficits and impairments:  Abnormal gait, Decreased activity tolerance, Decreased balance, Decreased endurance, Decreased knowledge of precautions, Decreased knowledge of use of DME, Decreased mobility, Decreased range of motion, Decreased safety awareness, Decreased strength, Difficulty walking, Increased fascial restricitons, Increased muscle spasms, Impaired perceived functional ability,  Impaired flexibility, Improper body mechanics, Postural dysfunction, Pain, Impaired UE functional use  Visit Diagnosis: Other idiopathic scoliosis, thoracolumbar region  Abnormal posture  Muscle weakness (generalized)  Other abnormalities of gait and mobility  History of falling     Problem List Patient Active Problem List   Diagnosis Date Noted   Abdominal fluid collection 08/08/2018   Hyperphosphatemia 08/07/2018   Moderate protein malnutrition ([place]) 08/06/2018   Pressure injury of skin 08/05/2018   Acute respiratory failure (HCC)    Sepsis ([place])    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 07/22/2018   Hypotension    Elevated lactic acid level    CKD (chronic kidney disease) stage 3, GFR 30-59 ml/min (HCC) 12/30/2017   Mild episode of recurrent major depressive disorder ([place]) 12/30/2017   Anxiety 04/07/2015   Diabetes ([place]) 04/07/2015   Hyperlipidemia 04/07/2015   DYSPEPSIA 05/26/2008   Epigastric pain 05/26/2008   Hypothyroidism 05/21/2008   DIVERTICULOSIS, COLON 05/21/2008   Constipation 05/21/2008   Stenosis of rectum and anus 05/21/2008   FIBROSITIS 05/21/2008   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  [place], [place], [address] Phone: [phone]   Fax:  [phone]  Name: Hailey Hughes. Hailey Hughes MRN: [id number] Date of Birth: 14-Dec-1940

## 2020-11-29 ENCOUNTER — Encounter: Payer: Medicare PPO | Admitting: Physical Therapy

## 2020-12-01 ENCOUNTER — Other Ambulatory Visit: Payer: Self-pay

## 2020-12-01 ENCOUNTER — Ambulatory Visit: Payer: Medicare PPO | Admitting: Physical Therapy

## 2020-12-01 ENCOUNTER — Encounter: Payer: Self-pay | Admitting: Physical Therapy

## 2020-12-01 DIAGNOSIS — M6281 Muscle weakness (generalized): Secondary | ICD-10-CM

## 2020-12-01 DIAGNOSIS — R293 Abnormal posture: Secondary | ICD-10-CM

## 2020-12-01 DIAGNOSIS — Z9181 History of falling: Secondary | ICD-10-CM

## 2020-12-01 DIAGNOSIS — M4125 Other idiopathic scoliosis, thoracolumbar region: Secondary | ICD-10-CM

## 2020-12-01 DIAGNOSIS — R2689 Other abnormalities of gait and mobility: Secondary | ICD-10-CM

## 2020-12-01 NOTE — Therapy (Signed)
[facility] [address]  [place], [place], [address] Phone: [phone]   Fax:  [phone]  Physical Therapy Treatment  Patient Details  Name: Hailey Hughes MRN: [id number] Date of Birth: Nov 07, 1940 Referring Provider (PT): Domingo Pulse, [place]   Encounter Date: 12/01/2020   PT End of Session - 12/01/20 1118     Visit Number 11    Number of Visits 16    Date for PT Re-Evaluation 12/06/20    Authorization Type [employer]    Authorization Time Period 10/13/20 - 12/06/20    Authorization - Visit Number 10    Authorization - Number of Visits 59    PT Start Time 1118   Pt arrived late   PT Stop Time 1151    PT Time Calculation (min) 33 min    Activity Tolerance Patient tolerated treatment well    Behavior During Therapy WFL for tasks assessed/performed             Past Medical History:  Diagnosis Date   Depression    Diverticulosis    DM2 (diabetes mellitus, type 2) ([place])    Hiatal hernia    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   Qualifier: Diagnosis of  By: Nelson-Smith CMA (AAMA), Dottie     HTN (hypertension)    Hyperlipidemia    Hypothyroidism    Imperforate anus    Obesity    OSA (obstructive sleep apnea)    Palpitations    Reflux esophagitis     Past Surgical History:  Procedure Laterality Date   APPENDECTOMY     CARPAL TUNNEL RELEASE Right    COLON RESECTION  1958   age 10   COLOSTOMY  60   age 52 due to imperforate anus   CYSTECTOMY     spinal   LAPAROTOMY N/A 07/23/2018   Procedure: TOTAL ABDOMINAL COLECTOMY, [name], CHOLECYSTECTOMY;  Surgeon: Fanny Skates, MD;  Location: [facility];  Service: General;  Laterality: N/A;   RECTOVAGINAL FISTULA CLOSURE     congenital    There were no vitals filed for this visit.   Subjective Assessment - 12/01/20 1121     Subjective Pt reports no pain except brief pain up to 4/10 in L thigh with sit to stand  transition which resolves immediately.    Patient Stated Goals "to be able to be more erect and have more endurance/stamina"    Currently in Pain? No/denies    Pain Score 0-No pain   up to 4/10 during sit to stand transition   Pain Location Leg   thigh   Pain Orientation Left;Upper;Anterior    Pain Descriptors / Indicators Spasm    Pain Type Chronic pain    Pain Frequency Occasional                               [facility] Adult PT Treatment/Exercise - 12/01/20 1118       Neck Exercises: Machines for Strengthening   Nustep L4 x 6 min (UE/LE)      Lumbar Exercises: Stretches   Passive Hamstring Stretch Right;Left;2 reps;30 seconds    Passive Hamstring Stretch Limitations seated hip hinge with foot resting on 9" stool      Knee/Hip Exercises: Seated   Sit to Sand 5 reps;without UE support   pain in L anterior thigh     Shoulder Exercises: Seated   Horizontal ABduction Both;10 reps;Strengthening;Theraband  Theraband Level (Shoulder Horizontal ABduction) Level 1 (Yellow)    Horizontal ABduction Limitations more difficulty on R but no pain    External Rotation Both;10 reps;Strengthening;Theraband    Theraband Level (Shoulder External Rotation) Level 1 (Yellow)    External Rotation Limitations cues for scap retraction keeping elbows tucked at sides      Shoulder Exercises: Standing   Extension Both;10 reps;Strengthening;Theraband    Theraband Level (Shoulder Extension) Level 1 (Yellow)    Extension Limitations cues for upright posture with scap retraction & abdominal/core activation    Row Both;10 reps;Strengthening;Theraband    Theraband Level (Shoulder Row) Level 1 (Yellow)    Row Limitations cues for upright posture with scap retraction & abdominal/core activation      Manual Therapy   Manual Therapy Soft tissue mobilization;Myofascial release    Soft tissue mobilization STM and roller stick to L anterior thigh    Myofascial Release manual TPR to L mid/upper  quads                       PT Short Term Goals - 10/25/20 1120       PT SHORT TERM GOAL #1   Title Patient will be independent with initial HEP    Status Achieved   10/25/20              PT Long Term Goals - 11/22/20 1036       PT LONG TERM GOAL #1   Title Patient will be independent with ongoing/advanced HEP for self-management at home    Status On-going   11/22/20- met for current   Target Date 12/06/20      PT LONG TERM GOAL #2   Title Improve posture and alignment with patient to demonstrate improved upright posture with posterior shoulder girdle engaged    Status On-going   11/22/20- pt. reports better awareness of posture     PT LONG TERM GOAL #3   Title Patient to demonstrate symmetrical step length and good heel-toe pattern with upright posture when ambulating with or w/o SPC or LRAD.    Status On-going   11/22/20- reports improved posture when walking with [facility]   Target Date 12/06/20      PT LONG TERM GOAL #4   Title Patient will improve standing and/or walking tolerance to >/= 20 minutes with good posture w/o fatigue or pain interference to allow resumption of normal daily activities    Status On-going   11/22/20- patient still fatigues quickly with walking but reports decreased recovery time and improved standing tolerance.   Target Date 12/06/20      PT LONG TERM GOAL #5   Title Patient will demonstrate improved proximal UE/LE strength to >/= 4/5 for available ROM to promote improved stability and ease of mobility    Status Partially Met   11/22/20- see flowsheet, met for LEs   Target Date 12/06/20      PT LONG TERM GOAL #6   Title Patient will demonstrate decreased TUG time to </= 13.5 sec to decrease risk for falls with transitional mobility    Baseline 16.63 sec with cane; 15.47 sec w/o AD    Status Achieved   11/22/20- 12.5 sec with cane, 12.96 sec without AD     PT LONG TERM GOAL #7   Title Patient will improve FGA score to >/= 20/30 to  improve gait stability and reduce risk for falls    Baseline 14/30    Status On-going  11/22/20- no change, 14/30   Target Date 12/06/20                   Plan - 12/01/20 1125     Clinical Impression Statement Hailey Hughes is nearing the end of her current POC and reports no recent pain other than very brief discomfort in L anterior thigh during repetitive sit to stand exercise added to her HEP last visit. Increased muscle tension identified in L mid/proximal quad which was addressed with manual STM and TPR along with instructions to pt in options for self-STM for carryover at home. Pain still noted with sit to stand transition but less intense - pt instructed to continue STM at home, but if pain does not continue to decrease, to defer sit <> stand exercise. Hailey Hughes, therefore reviewed remainder of HEP clarifying exercises as needed and progressing rows/shoulder extension to standing to increase core activation and balance reactions. Session limited by pt's late arrival.    Comorbidities Hypotension, HTN, Hirschsprung disease, ileostomy, DM-II, hypothyroidism, anxiety, depression, OSA    Rehab Potential Good    PT Frequency 2x / Hughes    PT Duration 8 weeks    PT Treatment/Interventions ADLs/Self Care Home Management;Electrical Stimulation;Iontophoresis 30m/ml Dexamethasone;Moist Heat;Ultrasound;DME Instruction;Gait training;Stair training;Functional mobility training;Therapeutic activities;Therapeutic exercise;Balance training;Neuromuscular re-education;Patient/family education;Manual techniques;Passive range of motion;Dry needling;Taping    PT Next Visit Plan goal assessment with anticipated transition to HEP +/- 30-day hold    PT Home Exercise Plan Access Code: [email or web address](8/30, updated 9/1, 9/6 & 9/13)    Consulted and Agree with Plan of Care Patient             Patient will benefit from skilled  therapeutic intervention in order to improve the following deficits and impairments:  Abnormal gait, Decreased activity tolerance, Decreased balance, Decreased endurance, Decreased knowledge of precautions, Decreased knowledge of use of DME, Decreased mobility, Decreased range of motion, Decreased safety awareness, Decreased strength, Difficulty walking, Increased fascial restricitons, Increased muscle spasms, Impaired perceived functional ability, Impaired flexibility, Improper body mechanics, Postural dysfunction, Pain, Impaired UE functional use  Visit Diagnosis: Other idiopathic scoliosis, thoracolumbar region  Abnormal posture  Muscle weakness (generalized)  Other abnormalities of gait and mobility  History of falling     Problem List Patient Active Problem List   Diagnosis Date Noted   Abdominal fluid collection 08/08/2018   Hyperphosphatemia 08/07/2018   Moderate protein malnutrition ([place] 08/06/2018   Pressure injury of skin 08/05/2018   Acute respiratory failure (HCC)    Sepsis ([place]    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 07/22/2018   Hypotension    Elevated lactic acid level    CKD (chronic kidney disease) stage 3, GFR 30-59 ml/min (HCC) 12/30/2017   Mild episode of recurrent major depressive disorder ([place] 12/30/2017   Anxiety 04/07/2015   Diabetes ([place] 04/07/2015   Hyperlipidemia 04/07/2015   DYSPEPSIA 05/26/2008   Epigastric pain 05/26/2008   Hypothyroidism 05/21/2008   DIVERTICULOSIS, COLON 05/21/2008   Constipation 05/21/2008   Stenosis of rectum and anus 05/21/2008   FIBROSITIS 05/21/2008   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 [place] [address]  Phone: [phone]   Fax:  [phone]  Name: Hailey Hughes MRN: [id number] Date of Birth: 01/25/1941

## 2020-12-06 ENCOUNTER — Other Ambulatory Visit: Payer: Self-pay

## 2020-12-06 ENCOUNTER — Encounter: Payer: Self-pay | Admitting: Physical Therapy

## 2020-12-06 ENCOUNTER — Ambulatory Visit: Payer: Medicare PPO | Admitting: Physical Therapy

## 2020-12-06 DIAGNOSIS — R293 Abnormal posture: Secondary | ICD-10-CM

## 2020-12-06 DIAGNOSIS — R2689 Other abnormalities of gait and mobility: Secondary | ICD-10-CM

## 2020-12-06 DIAGNOSIS — M4125 Other idiopathic scoliosis, thoracolumbar region: Secondary | ICD-10-CM

## 2020-12-06 DIAGNOSIS — M6281 Muscle weakness (generalized): Secondary | ICD-10-CM

## 2020-12-06 DIAGNOSIS — Z9181 History of falling: Secondary | ICD-10-CM

## 2020-12-06 NOTE — Therapy (Addendum)
[facility] [address]  [place], [place], [address] Phone: [phone]   Fax:  [phone]  Physical Therapy Treatment / Progress Note / Discharge Summary  Patient Details  Name: Hailey Hughes. Owen MRN: [id number] Date of Birth: Jun 14, 1940 Referring Provider (PT): Domingo Pulse, PA-C  Progress Note  Reporting Period 11/22/2020 to 12/06/2020  See note below for Objective Data and Assessment of Progress/Goals.     Encounter Date: 12/06/2020   PT End of Session - 12/06/20 1111     Visit Number 12    Number of Visits 16    Date for PT Re-Evaluation 12/06/20    Authorization Type [employer]    Authorization Time Period 10/13/20 - 12/06/20    Authorization - Visit Number 11    Authorization - Number of Visits 74    PT Start Time 34   Pt arrived late then dealing with copays for today and last visit   PT Stop Time 1152    PT Time Calculation (min) 41 min    Activity Tolerance Patient tolerated treatment well    Behavior During Therapy WFL for tasks assessed/performed             Past Medical History:  Diagnosis Date   Depression    Diverticulosis    DM2 (diabetes mellitus, type 2) ([place])    Hiatal hernia    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   Qualifier: Diagnosis of  By: Nelson-Smith CMA (AAMA), Dottie     HTN (hypertension)    Hyperlipidemia    Hypothyroidism    Imperforate anus    Obesity    OSA (obstructive sleep apnea)    Palpitations    Reflux esophagitis     Past Surgical History:  Procedure Laterality Date   APPENDECTOMY     CARPAL TUNNEL RELEASE Right    COLON RESECTION  1958   age 13   COLOSTOMY  29   age 50 due to imperforate anus   CYSTECTOMY     spinal   LAPAROTOMY N/A 07/23/2018   Procedure: TOTAL ABDOMINAL COLECTOMY, [name], CHOLECYSTECTOMY;  Surgeon: Fanny Skates, MD;  Location: [facility];  Service: General;  Laterality: N/A;   RECTOVAGINAL  FISTULA CLOSURE     congenital    There were no vitals filed for this visit.   Subjective Assessment - 12/06/20 1116     Subjective Pt reports she has been to complete her HEP on a regular basis. Pain has been minimal - no more than 1-2/10 and only occasionally present in L thigh.    How long can you stand comfortably? 20 minutes    How long can you walk comfortably? 20 minutes    Patient Stated Goals "to be able to be more erect and have more endurance/stamina"    Currently in Pain? No/denies                [facility] PT Assessment - 12/06/20 1111       Assessment   Medical Diagnosis Scoliosis    Referring Provider (PT) Domingo Pulse, PA-C    Onset Date/Surgical Date --   chronic     Precautions   Precautions Fall      Strength   Overall Strength Comments shoulder strength for available ROM; LE strength tested in sitting    Right Shoulder Flexion 4-/5    Right Shoulder ABduction 4-/5    Right Shoulder Internal Rotation 4/5    Right Shoulder External  Rotation 4-/5    Left Shoulder Flexion 4/5    Left Shoulder ABduction 4/5    Left Shoulder Internal Rotation 4/5    Left Shoulder External Rotation 4-/5    Right Hip Flexion 4+/5    Right Hip Extension 4/5    Right Hip ABduction 4+/5    Right Hip ADduction 4+/5    Left Hip Flexion 4+/5    Left Hip Extension 4+/5    Left Hip ABduction 4+/5    Right Knee Flexion 5/5    Right Knee Extension 5/5    Left Knee Flexion 5/5    Left Knee Extension 4+/5      Ambulation/Gait   Gait velocity 2.05 ft/sec w/o AD      Standardized Balance Assessment   10 Meter Walk 15.97 sec w/o AD      Functional Gait  Assessment   Gait Level Surface Walks 20 ft, slow speed, abnormal gait pattern, evidence for imbalance or deviates 10-15 in outside of the 12 in walkway width. Requires more than 7 sec to ambulate 20 ft.    Change in Gait Speed Able to change speed, demonstrates mild gait deviations, deviates 6-10 in outside of the 12 in  walkway width, or no gait deviations, unable to achieve a major change in velocity, or uses a change in velocity, or uses an assistive device.    Gait with Horizontal Head Turns Performs head turns smoothly with slight change in gait velocity (eg, minor disruption to smooth gait path), deviates 6-10 in outside 12 in walkway width, or uses an assistive device.    Gait with Vertical Head Turns Performs task with slight change in gait velocity (eg, minor disruption to smooth gait path), deviates 6 - 10 in outside 12 in walkway width or uses assistive device    Gait and Pivot Turn Pivot turns safely within 3 sec and stops quickly with no loss of balance.    Step Over Obstacle Is able to step over one shoe box (4.5 in total height) but must slow down and adjust steps to clear box safely. May require verbal cueing.    Gait with Narrow Base of Support Ambulates 4-7 steps.    Gait with Eyes Closed Walks 20 ft, slow speed, abnormal gait pattern, evidence for imbalance, deviates 10-15 in outside 12 in walkway width. Requires more than 9 sec to ambulate 20 ft.    Ambulating Backwards Walks 20 ft, slow speed, abnormal gait pattern, evidence for imbalance, deviates 10-15 in outside 12 in walkway width.    Steps Two feet to a stair, must use rail.    Total Score 15    FGA comment: < 19 = high risk fall                           [facility] Adult PT Treatment/Exercise - 12/06/20 1111       Ambulation/Gait   Ambulation/Gait Assistance 5: Supervision    Ambulation/Gait Assistance Details Cues for proper reciprocal sequencing of SPC as pt noted to take several steps for each advancement of cane.    Ambulation Distance (Feet) 120 Feet    Assistive device Straight cane    Gait Pattern Step-through pattern    Ambulation Surface Level;Indoor    Gait Comments Good posture but taking multiple steps for each advancement of SPC - when corrected for reciprocal pattern with cane, she demonstrates flexed  posture while trying to watch what her feet and the  SPC are doing      Neck Exercises: Machines for Strengthening   Nustep L5 x 6 min (UE/LE)                       PT Short Term Goals - 10/25/20 1120       PT SHORT TERM GOAL #1   Title Patient will be independent with initial HEP    Status Achieved   10/25/20              PT Long Term Goals - 12/06/20 1120       PT LONG TERM GOAL #1   Title Patient will be independent with ongoing/advanced HEP for self-management at home    Status Achieved   12/06/20     PT LONG TERM GOAL #2   Title Improve posture and alignment with patient to demonstrate improved upright posture with posterior shoulder girdle engaged    Status Achieved   12/06/20     PT LONG TERM GOAL #3   Title Patient to demonstrate symmetrical step length and good heel-toe pattern with upright posture when ambulating with or w/o SPC or LRAD.    Status Partially Met   1025/22 - good posture but taking multiple steps for each advancement of SPC; when corrected for reciprocal pattern with cane, she demonstrate flexed posture while trying to watch what her feet and the [facility] are doing     PT LONG TERM GOAL #4   Title Patient will improve standing and/or walking tolerance to >/= 20 minutes with good posture w/o fatigue or pain interference to allow resumption of normal daily activities    Status Achieved   12/06/20 - pt reports able to be on her feet 20+ minutes in her home as well as abilty to be "out and about" and come home less fatigued     PT LONG TERM GOAL #5   Title Patient will demonstrate improved proximal UE/LE strength to >/= 4/5 for available ROM to promote improved stability and ease of mobility    Status Partially Met   12/06/20 - mostly met with exception of R shoulder grossly 4-/5     PT LONG TERM GOAL #6   Title Patient will demonstrate decreased TUG time to </= 13.5 sec to decrease risk for falls with transitional mobility    Baseline 16.63  sec with cane; 15.47 sec w/o AD    Status Achieved   11/22/20 - 12.5 sec with cane, 12.96 sec without AD     PT LONG TERM GOAL #7   Title Patient will improve FGA score to >/= 20/30 to improve gait stability and reduce risk for falls    Baseline 14/30    Status Not Met   11/22/20- no change, 14/30                  Plan - 12/06/20 1152     Clinical Impression Statement Jerae is pleased with her progress, noting improved posture and activity tolerance, and would like to transition to her HEP at this time. She has demonstrated improved posture with gait and exercises, although did revert to forward head flexed posture today with gait when attempting to correct her sequencing with the [facility]. Her overall UE and LE strength has improved with majority of her MMT >/= 4+/5 although ongoing weakness and limited ROM present in B shoulders R>L. Her balance has improved with her TUG time now below the high fall risk threshold both with  and w/o SPC, however her FGA score of 15/30 still indicates high risk for falls. All STGs met and majority of LTGs met or partially met with only FGA goal not met. Discussed her progress and ongoing deficits identified and pt still wishing to proceed with transition to HEP rather than recert, but will remain on hold for 30-days in the event that issues arise which would necessitate a return to PT.    Comorbidities Hypotension, HTN, Hirschsprung disease, ileostomy, DM-II, hypothyroidism, anxiety, depression, OSA    Rehab Potential Good    PT Treatment/Interventions ADLs/Self Care Home Management;Electrical Stimulation;Iontophoresis 30m/ml Dexamethasone;Moist Heat;Ultrasound;DME Instruction;Gait training;Stair training;Functional mobility training;Therapeutic activities;Therapeutic exercise;Balance training;Neuromuscular re-education;Patient/family education;Manual techniques;Passive range of motion;Dry needling;Taping    PT Next Visit Plan transition to HEP + 30-day hold     PT Home Exercise Plan Access Code: [email or web address](8/30, updated 9/1, 9/6 & 9/13)    Consulted and Agree with Plan of Care Patient             Patient will benefit from skilled therapeutic intervention in order to improve the following deficits and impairments:  Abnormal gait, Decreased activity tolerance, Decreased balance, Decreased endurance, Decreased knowledge of precautions, Decreased knowledge of use of DME, Decreased mobility, Decreased range of motion, Decreased safety awareness, Decreased strength, Difficulty walking, Increased fascial restricitons, Increased muscle spasms, Impaired perceived functional ability, Impaired flexibility, Improper body mechanics, Postural dysfunction, Pain, Impaired UE functional use  Visit Diagnosis: Other idiopathic scoliosis, thoracolumbar region  Abnormal posture  Muscle weakness (generalized)  Other abnormalities of gait and mobility  History of falling     Problem List Patient Active Problem List   Diagnosis Date Noted   Abdominal fluid collection 08/08/2018   Hyperphosphatemia 08/07/2018   Moderate protein malnutrition ([place] 08/06/2018   Pressure injury of skin 08/05/2018   Acute respiratory failure (HCC)    Sepsis ([place]    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 07/22/2018   Hypotension    Elevated lactic acid level    CKD (chronic kidney disease) stage 3, GFR 30-59 ml/min (HCC) 12/30/2017   Mild episode of recurrent major depressive disorder ([place] 12/30/2017   Anxiety 04/07/2015   Diabetes ([place] 04/07/2015   Hyperlipidemia 04/07/2015   DYSPEPSIA 05/26/2008   Epigastric pain 05/26/2008   Hypothyroidism 05/21/2008   DIVERTICULOSIS, COLON 05/21/2008   Constipation 05/21/2008   Stenosis of rectum and anus 05/21/2008   FIBROSITIS 05/21/2008   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 [place] [address]Phone: [phone]  Fax:  [phone] Name: VPrincesa Willig Tash MRN: [id number] of Birth: [birth year]  PHYSICAL THERAPY DISCHARGE SUMMARY  Visits from Start of Care: 12  Current functional level related to goals / functional outcomes:   Refer to above clinical impression for status as of last visit on 12/06/2020. Patient was placed on hold for 30 days and has not needed to return to PT, therefore will proceed with discharge from PT for this episode.   Remaining deficits:   As above.   Education / Equipment:   HEP, pBiomedical scientisteducation   Patient agrees to discharge. Patient goals were partially met. Patient is being discharged due to being pleased with the current functional level.  Percival Spanish, PT, MPT 01/04/21, 8:23 AM  [facility] [address]  [place], [place], [address] Phone: [phone]   Fax:  [phone]

## 2021-12-04 ENCOUNTER — Emergency Department (HOSPITAL_BASED_OUTPATIENT_CLINIC_OR_DEPARTMENT_OTHER): Payer: Medicare PPO

## 2021-12-04 ENCOUNTER — Encounter (HOSPITAL_BASED_OUTPATIENT_CLINIC_OR_DEPARTMENT_OTHER): Payer: Self-pay | Admitting: Emergency Medicine

## 2021-12-04 ENCOUNTER — Emergency Department (HOSPITAL_BASED_OUTPATIENT_CLINIC_OR_DEPARTMENT_OTHER)
Admission: EM | Admit: 2021-12-04 | Discharge: 2021-12-04 | Disposition: A | Discharge status: Home / Self Care | Payer: Medicare PPO | Attending: Emergency Medicine | Admitting: Emergency Medicine

## 2021-12-04 DIAGNOSIS — K6289 Other specified diseases of anus and rectum: Secondary | ICD-10-CM | POA: Diagnosis present

## 2021-12-04 DIAGNOSIS — Z79899 Other long term (current) drug therapy: Secondary | ICD-10-CM | POA: Diagnosis not present

## 2021-12-04 DIAGNOSIS — R197 Diarrhea, unspecified: Secondary | ICD-10-CM | POA: Insufficient documentation

## 2021-12-04 DIAGNOSIS — R109 Unspecified abdominal pain: Secondary | ICD-10-CM | POA: Insufficient documentation

## 2021-12-04 DIAGNOSIS — Z794 Long term (current) use of insulin: Secondary | ICD-10-CM | POA: Insufficient documentation

## 2021-12-04 LAB — COMPREHENSIVE METABOLIC PANEL
ALT: 79 U/L — ABNORMAL HIGH (ref 0–44)
AST: 51 U/L — ABNORMAL HIGH (ref 15–41)
Albumin: 4 g/dL (ref 3.5–5.0)
Alkaline Phosphatase: 95 U/L (ref 38–126)
Anion gap: 8 (ref 5–15)
BUN: 29 mg/dL — ABNORMAL HIGH (ref 8–23)
CO2: 22 mmol/L (ref 22–32)
Calcium: 9.2 mg/dL (ref 8.9–10.3)
Chloride: 109 mmol/L (ref 98–111)
Creatinine, Ser: 2.03 mg/dL — ABNORMAL HIGH (ref 0.44–1.00)
GFR, Estimated: 24 mL/min — ABNORMAL LOW (ref >60)
Glucose, Bld: 108 mg/dL — ABNORMAL HIGH (ref 70–99)
Potassium: 4.3 mmol/L (ref 3.5–5.1)
Sodium: 139 mmol/L (ref 135–145)
Total Bilirubin: 1.1 mg/dL (ref 0.3–1.2)
Total Protein: 7.3 g/dL (ref 6.5–8.1)

## 2021-12-04 LAB — CBC WITH DIFFERENTIAL/PLATELET
Abs Immature Granulocytes: 0.02 10*3/uL (ref 0.00–0.07)
Basophils Absolute: 0.1 10*3/uL (ref 0.0–0.1)
Basophils Relative: 1 %
Eosinophils Absolute: 0.3 10*3/uL (ref 0.0–0.5)
Eosinophils Relative: 5 %
HCT: 37.6 % (ref 36.0–46.0)
Hemoglobin: 11.9 g/dL — ABNORMAL LOW (ref 12.0–15.0)
Immature Granulocytes: 0 %
Lymphocytes Relative: 11 %
Lymphs Abs: 0.7 10*3/uL (ref 0.7–4.0)
MCH: 28.6 pg (ref 26.0–34.0)
MCHC: 31.6 g/dL (ref 30.0–36.0)
MCV: 90.4 fL (ref 80.0–100.0)
Monocytes Absolute: 0.7 10*3/uL (ref 0.1–1.0)
Monocytes Relative: 10 %
Neutro Abs: 5 10*3/uL (ref 1.7–7.7)
Neutrophils Relative %: 73 %
Platelets: 153 10*3/uL (ref 150–400)
RBC: 4.16 MIL/uL (ref 3.87–5.11)
RDW: 13.7 % (ref 11.5–15.5)
WBC: 6.9 10*3/uL (ref 4.0–10.5)
nRBC: 0 % (ref 0.0–0.2)

## 2021-12-04 LAB — URINALYSIS, ROUTINE W REFLEX MICROSCOPIC
Bilirubin Urine: NEGATIVE
Glucose, UA: NEGATIVE mg/dL
Hgb urine dipstick: NEGATIVE
Ketones, ur: NEGATIVE mg/dL
Leukocytes,Ua: NEGATIVE
Nitrite: NEGATIVE
Protein, ur: NEGATIVE mg/dL
Specific Gravity, Urine: <=1.005 (ref 1.005–1.030)
pH: 5.5 (ref 5.0–8.0)

## 2021-12-04 LAB — LIPASE, BLOOD: Lipase: 23 U/L (ref 11–51)

## 2021-12-04 MED ORDER — ONDANSETRON HCL 4 MG/2ML IJ SOLN: 4.0000 mg | Freq: Once | INTRAMUSCULAR | Status: DC

## 2021-12-04 MED ORDER — SODIUM CHLORIDE 0.9 % IV BOLUS
1000.0000 mL | Freq: Once | INTRAVENOUS | Status: AC
  Administered 2021-12-04: 1000 mL via INTRAVENOUS

## 2021-12-04 MED ORDER — MORPHINE SULFATE (PF) 2 MG/ML IV SOLN: 2.0000 mg | Freq: Once | INTRAVENOUS | Status: DC

## 2021-12-04 NOTE — Discharge Instructions (Addendum)
Follow up with your surgeon in the office.  Return for worsening pain, fever, inability to eat or drink.

## 2021-12-04 NOTE — ED Provider Notes (Signed)
[facility] EMERGENCY DEPARTMENT Provider Note   CSN: [id number] Arrival date & time: 12/04/21  1031     History  Chief Complaint  Patient presents with   Abdominal Pain    Hailey Hughes is a 81 y.o. female.  81 yo F with a chief complaint of stool coming out of her rectum.  This is not normal for her.  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  Going on for about 3 days.  She is having some cramping in her abdomen like when you have diarrhea.  Denies fevers denies nausea denies vomiting.  Denies foreign body to the rectum.   Abdominal Pain      Home Medications Prior to Admission medications   Medication Sig Start Date End Date Taking? Authorizing Provider  acetaminophen (TYLENOL) 500 MG tablet Take 2 tablets (1,000 mg total) by mouth every 6 (six) hours. Patient taking differently: Take 1,000 mg by mouth every 6 (six) hours as needed. 08/20/18   Dorcas Carrow, MD  amLODipine (NORVASC) 5 MG tablet Take 5 mg by mouth daily.    [provider]  amLODipine-benazepril (LOTREL) 10-40 MG capsule Take 1 capsule by mouth daily.    [provider]  atorvastatin (LIPITOR) 20 MG tablet Take 20 mg by mouth every evening.     [provider]  bumetanide (BUMEX) 2 MG tablet Take by mouth daily.    [provider]  celecoxib (CELEBREX) 200 MG capsule Take 200 mg by mouth daily. 11/27/18   [provider]  doxycycline (VIBRA-TABS) 100 MG tablet Take 1 tablet (100 mg total) by mouth 2 (two) times daily. Patient not taking: Reported on 10/11/2020 02/23/19   Felecia Shelling, DPM  dupilumab (DUPIXENT) 300 MG/2ML prefilled syringe Inject 300 mg into the skin every 14 (fourteen) days.    [provider]  febuxostat (ULORIC) 40 MG tablet Take 40 mg by mouth daily.    [provider]  fluconazole (DIFLUCAN) 100 MG tablet Take 100 mg by mouth daily. Patient not taking: Reported on  10/11/2020 12/04/18   [provider]  gentamicin cream (GARAMYCIN) 0.1 % Apply 1 application topically 2 (two) times daily. Patient not taking: Reported on 10/11/2020 02/09/19   Felecia Shelling, DPM  HYDROcodone-acetaminophen (NORCO) 7.5-325 MG tablet Take 1 tablet by mouth every 6 (six) hours as needed for moderate pain.    [provider]  insulin aspart (NOVOLOG) 100 UNIT/ML injection Inject 0-15 Units into the skin 4 (four) times daily -  before meals and at bedtime. Patient not taking: Reported on 10/11/2020 08/20/18   Dorcas Carrow, MD  levothyroxine (SYNTHROID) 100 MCG tablet Take 100 mcg by mouth every other day.    [provider]  metFORMIN (GLUCOPHAGE-XR) 500 MG 24 hr tablet Take 500 mg by mouth daily. 01/20/19   [provider]  metoprolol succinate (TOPROL-XL) 50 MG 24 hr tablet Take 100 mg by mouth daily. 12/30/17   [provider]  olopatadine (PATANOL) 0.1 % ophthalmic solution Place 1 drop into both eyes daily as needed for allergies.  12/30/17   [provider]  oxybutynin (DITROPAN XL) 15 MG 24 hr tablet Take 15 mg by mouth at bedtime.    [provider]  pantoprazole (PROTONIX) 40 MG tablet Take 1 tablet (40 mg total) by mouth daily. 02/09/14   Hart Carwin, MD  senna (SENOKOT) 8.6 MG tablet Take 1 tablet by mouth daily as needed  for constipation. Patient not taking: Reported on 10/11/2020    [provider]  tiZANidine (ZANAFLEX) 2 MG tablet Take 4 mg by mouth every 6 (six) hours as needed for muscle spasms. 1-2 tabs daily as needed for muscle spasms    [provider]  traZODone (DESYREL) 50 MG tablet Take 1 tablet (50 mg total) by mouth at bedtime as needed for sleep. Patient not taking: Reported on 10/11/2020 08/20/18   Dorcas Carrow, MD      Allergies    Bee venom, Epinephrine, Sulfamethoxazole-trimethoprim, and Cephalexin    Review of Systems   Review of Systems  Gastrointestinal:  Positive  for abdominal pain.    Physical Exam Updated Vital Signs BP (!) 168/75   Pulse 72   Temp 97.7 F (36.5 C) (Oral)   Resp 18   Ht 5\' 2"  (1.575 m)   Wt 86.2 kg   LMP  (LMP Unknown)   SpO2 97%   BMI 34.75 kg/m  Physical Exam Vitals and nursing note reviewed.  Constitutional:      General: She is not in acute distress.    Appearance: She is well-developed. She is not diaphoretic.  HENT:     Head: Normocephalic and atraumatic.  Eyes:     Pupils: Pupils are equal, round, and reactive to light.  Cardiovascular:     Rate and Rhythm: Normal rate and regular rhythm.     Heart sounds: No murmur heard.    No friction rub. No gallop.  Pulmonary:     Effort: Pulmonary effort is normal.     Breath sounds: No wheezing or rales.  Abdominal:     General: There is no distension.     Palpations: Abdomen is soft.     Tenderness: There is no abdominal tenderness.  Genitourinary:    Comments: Soft brown stool in the vault Musculoskeletal:        General: No tenderness.     Cervical back: Normal range of motion and neck supple.  Skin:    General: Skin is warm and dry.  Neurological:     Mental Status: She is alert and oriented to person, place, and time.  Psychiatric:        Behavior: Behavior normal.     ED Results / Procedures / Treatments   Labs (all labs ordered are listed, but only abnormal results are displayed) Labs Reviewed  CBC WITH DIFFERENTIAL/PLATELET - Abnormal; Notable for the following components:      Result Value   Hemoglobin 11.9 (*)    All other components within normal limits  COMPREHENSIVE METABOLIC PANEL - Abnormal; Notable for the following components:   Glucose, Bld 108 (*)    BUN 29 (*)    Creatinine, Ser 2.03 (*)    AST 51 (*)    ALT 79 (*)    GFR, Estimated 24 (*)    All other components within normal limits  LIPASE, BLOOD  URINALYSIS, ROUTINE W REFLEX MICROSCOPIC    EKG None  Radiology CT ABDOMEN PELVIS WO CONTRAST  Result Date:  12/04/2021 CLINICAL DATA:  Abdominal pain, acute, nonlocalized lower abdominal pain and rectal discharge EXAM: CT ABDOMEN AND PELVIS WITHOUT CONTRAST TECHNIQUE: Multidetector CT imaging of the abdomen and pelvis was performed following the standard protocol without IV contrast. RADIATION DOSE REDUCTION: This exam was performed according to the departmental dose-optimization program which includes automated exposure control, adjustment of the mA and/or kV according to patient size and/or use of iterative reconstruction technique. COMPARISON:  CT abdomen pelvis 08/15/2018 FINDINGS: Lower chest: Bibasilar scarring.  No acute abnormality. Hepatobiliary: No focal liver abnormality is seen. Prior cholecystectomy. Unchanged small calcification in the gallbladder fossa measuring 4 mm. Pancreas: Unremarkable. No pancreatic ductal dilatation or surrounding inflammatory changes. Spleen: Normal in size without focal abnormality. Adrenals/Urinary Tract: Adrenal glands are unremarkable. No hydronephrosis. 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 The bladder is moderately distended. Stomach/Bowel: Postsurgical changes of colectomy with right lower quadrant ileostomy. There is soft tissue nodularity in the rectal pouch with adjacent stranding (series 2 image 64, series 6 image 77). No evidence of bowel obstruction. Vascular/Lymphatic: Aortoiliac atherosclerosis. No AAA. No lymphadenopathy. Reproductive: Unremarkable. Other: There is a ventral hernia containing nonobstructed bowel. More inferiorly, there is a nonspecific fluid collection along the anterior abdominal wall without a clear associated fascial defect measuring 2.9 x 3.8 x 5.8 cm (series 2, image 57, series 6, image 78). No intra-abdominal fluid collection. Musculoskeletal: No acute osseous abnormality. No suspicious osseous lesion. Multilevel degenerative changes of the spine.  IMPRESSION: Postsurgical changes of colectomy with right lower quadrant ileostomy. Soft tissue nodularity in the rectal pouch with adjacent stranding, could potentially represent a rectal mass and warrants further evaluation with direct visualization. An infectious/inflammatory process/pouchitis is also a possibility. Ventral hernia containing nonobstructed bowel. Nonspecific fluid collection along the lower anterior abdominal wall, measuring 2.9 x 3.8 x 5.8 cm, possibly a seroma. Electronically Signed   By: Caprice Renshaw [name]   On: 12/04/2021 13:14    Procedures Procedures    Medications Ordered in ED Medications  morphine (PF) 2 MG/ML injection 2 mg (2 mg Intravenous Patient Refused/Not Given 12/04/21 1147)  ondansetron (ZOFRAN) injection 4 mg (4 mg Intravenous Patient Refused/Not Given 12/04/21 1146)  sodium chloride 0.9 % bolus 1,000 mL (1,000 mLs Intravenous New Bag/Given 12/04/21 1146)    ED Course/ Medical Decision Making/ A&P                           Medical Decision Making Amount and/or Complexity of Data Reviewed Labs: ordered. Radiology: ordered.  Risk Prescription drug management.   81 yo F with a chief complaints of stool coming out of her rectum.  Is been going on for about 3 days.  This is new for her since she had a diverting ileostomy.  On exam clinically appears to have stool coming out of the rectum.  We will obtain a CT scan of the abdomen pelvis.  Reassess.  CT scan with possible rectal pouch mass.  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  I discussed the case with general surgery, Tresa Endo discussed the case with Dr. Cliffton Asters.  Recommended following up in the office.  Patient would like to have her urine tested while she is here.  UA is negative for infection. 3:18 PM:  I have discussed the diagnosis/risks/treatment options with the patient and family.  Evaluation and diagnostic testing in the emergency department does not suggest  an emergent condition requiring admission or immediate intervention beyond what has been performed at this time.  They will follow up with Colorectal surgery. We also discussed returning to the ED immediately if new or worsening sx occur. We discussed the sx which are most concerning (e.g., sudden worsening pain, fever, inability to tolerate by mouth) that necessitate immediate return. Medications administered to the patient during  their visit and any new prescriptions provided to the patient are listed below.  Medications given during this visit Medications  morphine (PF) 2 MG/ML injection 2 mg (2 mg Intravenous Patient Refused/Not Given 12/04/21 1147)  ondansetron (ZOFRAN) injection 4 mg (4 mg Intravenous Patient Refused/Not Given 12/04/21 1146)  sodium chloride 0.9 % bolus 1,000 mL (1,000 mLs Intravenous New Bag/Given 12/04/21 1146)     The patient appears reasonably screen and/or stabilized for discharge and I doubt any other medical condition or other [facility] requiring further screening, evaluation, or treatment in the ED at this time prior to discharge.           Final Clinical Impression(s) / ED Diagnoses Final diagnoses:  Rectal pain    Rx / DC Orders ED Discharge Orders     None         Deno Etienne, DO 12/04/21 1518

## 2021-12-04 NOTE — ED Notes (Signed)
Client requested to remain NPO until further orders rec by the MD

## 2021-12-04 NOTE — ED Triage Notes (Signed)
Patient presents POV reporting lower abdominal pain X few days in addition to "rectal discharge." Patient reports she had a colon resection and has had colostomy bag X several years so she is not sure why she is now having stool "oozing" from her rectum. She called her PCP who referred her here for eval.

## 2021-12-15 ENCOUNTER — Telehealth: Payer: Self-pay | Admitting: Gastroenterology

## 2021-12-15 NOTE — Telephone Encounter (Signed)
Patient called to schedule from her referral. I needed a nurse to help me schedule as the referral was urgent. I spoke with nurse, told her about the patient and transferred her over. Please make sure patient gets scheduled.

## 2021-12-18 NOTE — Telephone Encounter (Signed)
Pt scheduled for appointment on 12/26/21 at 2:40 PM with Dr. Bryan Lemma. Pt verbalized understanding.

## 2021-12-26 ENCOUNTER — Ambulatory Visit: Payer: Medicare PPO | Admitting: Gastroenterology

## 2021-12-26 ENCOUNTER — Encounter: Payer: Self-pay | Admitting: Gastroenterology

## 2021-12-26 VITALS — BP 118/78 | HR 71 | Ht 62.0 in | Wt 193.0 lb

## 2021-12-26 DIAGNOSIS — Z932 Ileostomy status: Secondary | ICD-10-CM | POA: Diagnosis not present

## 2021-12-26 DIAGNOSIS — R9389 Abnormal findings on diagnostic imaging of other specified body structures: Secondary | ICD-10-CM | POA: Diagnosis not present

## 2021-12-26 DIAGNOSIS — R195 Other fecal abnormalities: Secondary | ICD-10-CM | POA: Diagnosis not present

## 2021-12-26 NOTE — Patient Instructions (Addendum)
You have been scheduled for a flexible sigmoidoscopy. Please follow the written instructions given to you at your visit today. If you use inhalers (even only as needed), please bring them with you on the day of your procedure.   Take Gas-X or Beano as needed.  We have placed a referral to [facility]   If you are age 83 or older, your body mass index should be between 23-30. Your Body mass index is 35.3 kg/m. If this is out of the aforementioned range listed, please consider follow up with your Primary Care Provider.  __________________________________________________________  The [facility] providers would like to encourage you to use [facility] to communicate with providers for non-urgent requests or questions.  Due to long hold times on the telephone, sending your provider a message by [facility] may be a faster and more efficient way to get a response.  Please allow 48 business hours for a response.  Please remember that this is for non-urgent requests.   Due to recent changes in healthcare laws, you may see the results of your imaging and laboratory studies on MyChart before your provider has had a chance to review them.  We understand that in some cases there may be results that are confusing or concerning to you. Not all laboratory results come back in the same time frame and the provider may be waiting for multiple results in order to interpret others.  Please give [place] 48 hours in order for your provider to thoroughly review all the results before contacting the office for clarification of your results.     Thank you for choosing me and [facility].  Vito Cirigliano, D.O.

## 2021-12-26 NOTE — Progress Notes (Signed)
Chief Complaint: Abnormal CT   Referring Provider:     Marin Olp, MD    HPI:     Hailey Hughes is a 81 y.o. 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 Cliffton Asters for evaluation of abnormal CT demonstrating nodularity of the soft tissue of the Hartman's pouch and request for urgent flexible sigmoidoscopy.  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 She ultimately presented to the hospital in June 2020 with multisystem organ failure, acidosis, septic shock, renal failure, Dr. 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  She was discharged with a full recovery.    She reports having essentially acute onset liquid, mucus-like stool about 3 weeks ago.  Prior to that, had not seen any stool from the rectum in about 2 years since her surgery in 2020.  Did have dark material mixed in with stool on the first day, but that has since subsided.  She presented to the ER on 12/04/2021 with the symptoms with CT A/P as outlined below.  Symptoms lasted for about a week, and was seen in follow-up by Dr. Cliffton Asters in the Colorectal Surgery clinic on 12/12/2021.  Ostomy intact and healthy-appearing.  Was referred to the GI clinic for expedited flexible sigmoidoscopy for further evaluation of abnormal CT findings as below.  Since then, no further stools from the rectum.  Last episode was about 2 weeks ago.  Otherwise, no upper GI symptoms, preserved appetite, and no abdominal pain.  She is requesting referral to local ostomy clinic to establish care and for help in finding a local ostomy support  group.   - 12/04/2021: CT A/P: Postsurgical changes of colectomy with right lower quadrant ileostomy. Soft tissue nodularity in the rectal pouch with adjacent stranding, could potentially represent a rectal mass and warrants further evaluation with direct visualization. An infectious/inflammatory process/pouchitis is also a possibility.  Ventral hernia containing nonobstructed bowel.  Nonspecific fluid collection along the lower anterior abdominal wall, measuring 2.9 x 3.8 x 5.8 cm, possibly a seroma.   - 06/08/2008: Flexible sigmoidoscopy: Postoperative changes in the sigmoid colon with wide open rectosigmoid anastomosis of 5 cm.  Melanosis coli. - 12/02/2013: Colonoscopy: Evidence of prior colocolonic surgical anastomosis in the rectum which was widely patent.  Colon proximal to anastomosis mildly dilated. Single diverticulum, otherwise normal colon mucosa.  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   Qualifier: Diagnosis of  By: Candice Camp CMA (AAMA), Dottie  HTN (hypertension)    Hyperlipidemia    Hypothyroidism    Imperforate anus    Obesity    OSA (obstructive sleep apnea)    Palpitations    Reflux esophagitis      Past Surgical History:  Procedure Laterality Date   APPENDECTOMY     CARPAL TUNNEL RELEASE Right    COLON RESECTION  1958   age 60   COLOSTOMY  47   age 41 due to imperforate anus   CYSTECTOMY     spinal   LAPAROTOMY N/A 07/23/2018   Procedure: TOTAL ABDOMINAL COLECTOMY, [name], CHOLECYSTECTOMY;  Surgeon: Claud Kelp,  MD;  Location: [facility];  Service: General;  Laterality: N/A;   RECTOVAGINAL FISTULA CLOSURE     congenital   Family History  Problem Relation Age of Onset   Heart attack Father    Hypertension Father    Hypertension Mother    Diabetes Sister    Colon cancer Maternal Uncle    Heart disease Brother    Colon polyps Sister    Esophageal cancer Neg Hx    Kidney disease Neg Hx    Social History   Tobacco Use   Smoking status: Never   Smokeless tobacco: Never  Vaping Use   Vaping Use: Never used  Substance Use Topics   Alcohol use: No   Drug use: No   Current Outpatient Medications  Medication Sig Dispense Refill   amLODipine (NORVASC) 5 MG tablet Take 5 mg by mouth daily.     atorvastatin (LIPITOR) 20 MG tablet Take 10 mg by mouth every evening.     bumetanide (BUMEX) 2 MG tablet Take by mouth daily.     celecoxib (CELEBREX) 200 MG capsule Take 200 mg by mouth daily.     dupilumab (DUPIXENT) 300 MG/2ML prefilled syringe Inject 300 mg into the skin every 14 (fourteen) days.     febuxostat (ULORIC) 40 MG tablet Take 40 mg by mouth daily.     HYDROcodone-acetaminophen (NORCO) 7.5-325 MG tablet Take 1 tablet by mouth every 6 (six) hours as needed for moderate pain.     levothyroxine (SYNTHROID) 100 MCG tablet Take 50 mcg by mouth every other day.     metoprolol succinate (TOPROL-XL) 50 MG 24 hr tablet Take 100 mg by mouth daily.     olopatadine (PATANOL) 0.1 % ophthalmic solution Place 1 drop into both eyes daily as needed for allergies.      oxybutynin (DITROPAN XL) 15 MG 24 hr tablet Take 15 mg by mouth at bedtime.     pantoprazole (PROTONIX) 40 MG tablet Take 1 tablet (40 mg total) by mouth daily. (Patient taking differently: Take 40 mg by mouth 2 (two) times daily.) 90 tablet 3   tiZANidine (ZANAFLEX) 2 MG tablet Take 4 mg by mouth every 6 (six) hours as needed for muscle spasms. 1-2 tabs daily as needed for muscle spasms     traZODone (DESYREL) 50 MG tablet Take 1 tablet (50 mg  total) by mouth at bedtime as needed for sleep.     acetaminophen (TYLENOL) 500 MG tablet Take 2 tablets (1,000 mg total) by mouth every 6 (six) hours. (Patient not taking: Reported on 12/26/2021) 30 tablet 0   amLODipine-benazepril (LOTREL) 10-40 MG capsule Take 1 capsule by mouth daily. (Patient not taking: Reported on 12/26/2021)     doxycycline (VIBRA-TABS) 100 MG tablet Take 1 tablet (100 mg total) by mouth 2 (two) times daily. (Patient not taking: Reported on 10/11/2020) 20 tablet  0   fluconazole (DIFLUCAN) 100 MG tablet Take 100 mg by mouth daily. (Patient not taking: Reported on 10/11/2020)     gentamicin cream (GARAMYCIN) 0.1 % Apply 1 application topically 2 (two) times daily. (Patient not taking: Reported on 10/11/2020) 15 g 1   insulin aspart (NOVOLOG) 100 UNIT/ML injection Inject 0-15 Units into the skin 4 (four) times daily -  before meals and at bedtime. (Patient not taking: Reported on 10/11/2020) 10 mL 11   metFORMIN (GLUCOPHAGE-XR) 500 MG 24 hr tablet Take 500 mg by mouth daily. (Patient not taking: Reported on 12/26/2021)     senna (SENOKOT) 8.6 MG tablet Take 1 tablet by mouth daily as needed for constipation. (Patient not taking: Reported on 10/11/2020)     No current facility-administered medications for this visit.   Allergies  Allergen Reactions   Bee Venom Anaphylaxis   Epinephrine Other (See Comments)    REACTION: Nervousness    Sulfamethoxazole-Trimethoprim Nausea Only    Patient complained of nausea, headache, anxiety and depression.   Cephalexin Rash     Review of Systems: All systems reviewed and negative except where noted in HPI.     Physical Exam:    Wt Readings from Last 3 Encounters:  12/26/21 193 lb (87.5 kg)  12/04/21 190 lb (86.2 kg)  09/12/20 179 lb (81.2 kg)    BP 118/78   Pulse 71   Ht 5\' 2"  (1.575 m)   Wt 193 lb (87.5 kg)   LMP  (LMP Unknown)   SpO2 95%   BMI 35.30 kg/m  Constitutional:  Pleasant, in no acute distress. Psychiatric:  Normal mood and affect. Behavior is normal. Neurological: Alert and oriented to person place and time. Skin: Skin is warm and dry. No rashes noted. Rectal: Exam deferred to time of sigmoidoscopy.     ASSESSMENT AND PLAN;   1) Abnormal CT 2) Mucus-like rectal discharge Discussed potential DDx for her acute onset mucus-like stools from rectum and abnormal CT findings to include infectious, diversion colitis, or potentially neoplasia. - Flexible sigmoidoscopy  3) History of subtotal colectomy with end ileostomy - Referral to wound/ostomy care clinic to establish care and for assistance in finding local ostomy support groups - Ok to use Gas-X or Beano  The indications, risks, and benefits of flexible sigmoidoscopy were explained to the patient in detail. Risks include but are not limited to bleeding, perforation, adverse reaction to medications, and cardiopulmonary compromise. Sequelae include but are not limited to the possibility of surgery, hospitalization, and mortality. The patient verbalized understanding and wished to proceed. All questions answered.     , DO, FACG  12/26/2021, 3:41 PM   12/28/2021., PA-C

## 2021-12-28 ENCOUNTER — Encounter: Payer: Self-pay | Admitting: Gastroenterology

## 2021-12-28 ENCOUNTER — Ambulatory Visit (AMBULATORY_SURGERY_CENTER): Payer: Medicare PPO | Admitting: Gastroenterology

## 2021-12-28 VITALS — BP 158/68 | HR 72 | Temp 97.5°F | Resp 18 | Ht 62.0 in | Wt 193.0 lb

## 2021-12-28 DIAGNOSIS — K604 Rectal fistula: Secondary | ICD-10-CM

## 2021-12-28 DIAGNOSIS — R9389 Abnormal findings on diagnostic imaging of other specified body structures: Secondary | ICD-10-CM | POA: Diagnosis not present

## 2021-12-28 DIAGNOSIS — K624 Stenosis of anus and rectum: Secondary | ICD-10-CM

## 2021-12-28 DIAGNOSIS — R195 Other fecal abnormalities: Secondary | ICD-10-CM | POA: Diagnosis not present

## 2021-12-28 DIAGNOSIS — Z933 Colostomy status: Secondary | ICD-10-CM

## 2021-12-28 MED ORDER — SODIUM CHLORIDE 0.9 % IV SOLN: 500.0000 mL | Freq: Once | INTRAVENOUS | Status: DC

## 2021-12-28 NOTE — Progress Notes (Signed)
GASTROENTEROLOGY PROCEDURE H&P NOTE   Primary Care Physician: Richmond Campbell., PA-C    Reason for Procedure:   Abnormal CT, mucus-like rectal stool, change in bowel habits  Plan:    Flexible sigmoidoscopy/pouchoscopy  Patient is appropriate for endoscopic procedure(s) in the ambulatory ([facility]) setting.  The nature of the procedure, as well as the risks, benefits, and alternatives were carefully and thoroughly reviewed with the patient. Ample time for discussion and questions allowed. The patient understood, was satisfied, and agreed to proceed.     HPI: Hailey Hughes is a 81 y.o. female who presents for flexible pouchoscopy for evaluation of abnormal CT scan and change in bowel habits with mucus-like material from rectal pouch .  Patient was most recently seen in the Gastroenterology Clinic on 12/26/2021 be me.  No interval change in medical history since that appointment. Please refer to that note for full details regarding GI history and clinical presentation.   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   Qualifier: Diagnosis of  By: Nelson-Smith CMA (AAMA), Dottie     HTN (hypertension)    Hyperlipidemia    Hypothyroidism    Imperforate anus    Obesity    OSA (obstructive sleep apnea)    Palpitations    Reflux esophagitis     Past Surgical History:  Procedure Laterality Date   APPENDECTOMY     CARPAL TUNNEL RELEASE Right    COLON RESECTION  1958   age 33   COLOSTOMY  71   age 4 due to imperforate anus   CYSTECTOMY     spinal   LAPAROTOMY N/A 07/23/2018   Procedure: TOTAL ABDOMINAL COLECTOMY, [name], CHOLECYSTECTOMY;  Surgeon: Claud Kelp, MD;  Location: [facility];  Service: General;  Laterality: N/A;   RECTOVAGINAL FISTULA CLOSURE     congenital    Prior to Admission medications   Medication Sig Start Date  End Date Taking? Authorizing Provider  amLODipine (NORVASC) 5 MG tablet Take 5 mg by mouth daily.   Yes [provider]  amLODipine-benazepril (LOTREL) 10-40 MG capsule Take 1 capsule by mouth daily.   Yes [provider]  atorvastatin (LIPITOR) 20 MG tablet Take 10 mg by mouth every evening.   Yes [provider]  celecoxib (CELEBREX) 200 MG capsule Take 200 mg by mouth daily. 11/27/18  Yes [provider]  febuxostat (ULORIC) 40 MG tablet Take 40 mg by mouth daily.   Yes [provider]  levothyroxine (SYNTHROID) 100 MCG tablet Take 50 mcg by mouth every other day.   Yes [provider]  metoprolol succinate (TOPROL-XL) 50 MG 24 hr tablet Take 100 mg by mouth daily. 12/30/17  Yes [provider]  oxybutynin (DITROPAN XL) 15 MG 24 hr tablet Take 15 mg by mouth at bedtime.   Yes [provider]  pantoprazole (PROTONIX) 40 MG tablet Take 1 tablet (40 mg total) by mouth daily. Patient taking differently: Take 40 mg by mouth 2 (two) times daily. 02/09/14  Yes Hart Carwin, MD  tiZANidine (ZANAFLEX) 2 MG tablet Take 4 mg by mouth every 6 (six) hours as needed for muscle spasms. 1-2 tabs daily as needed for muscle spasms   Yes [provider]  acetaminophen (TYLENOL) 500 MG tablet Take 2 tablets (1,000 mg total) by mouth every 6 (six)  hours. Patient not taking: Reported on 12/26/2021 08/20/18   Dorcas Carrow, MD  bumetanide (BUMEX) 2 MG tablet Take by mouth daily.    [provider]  doxycycline (VIBRA-TABS) 100 MG tablet Take 1 tablet (100 mg total) by mouth 2 (two) times daily. Patient not taking: Reported on 10/11/2020 02/23/19   Felecia Shelling, DPM  dupilumab (DUPIXENT) 300 MG/2ML prefilled syringe Inject 300 mg into the skin every 14 (fourteen) days.    [provider]  fluconazole (DIFLUCAN) 100 MG tablet Take 100 mg by mouth daily. Patient not taking: Reported on 10/11/2020 12/04/18   [provider]  gentamicin cream (GARAMYCIN) 0.1 % Apply 1 application topically 2 (two) times daily. Patient not taking: Reported on 10/11/2020 02/09/19   Felecia Shelling, DPM  HYDROcodone-acetaminophen (NORCO) 7.5-325 MG tablet Take 1 tablet by mouth every 6 (six) hours as needed for moderate pain.    [provider]  metFORMIN (GLUCOPHAGE-XR) 500 MG 24 hr tablet Take 500 mg by mouth daily. Patient not taking: Reported on 12/26/2021 01/20/19   [provider]  olopatadine (PATANOL) 0.1 % ophthalmic solution Place 1 drop into both eyes daily as needed for allergies.  Patient not taking: Reported on 12/28/2021 12/30/17   [provider]  OZEMPIC, 0.25 OR 0.5 MG/DOSE, 2 MG/3ML SOPN Inject 0.25 mg into the skin.    [provider]  senna (SENOKOT) 8.6 MG tablet Take 1 tablet by mouth daily as needed for constipation. Patient not taking: Reported on 10/11/2020    [provider]  traZODone (DESYREL) 50 MG tablet Take 1 tablet (50 mg total) by mouth at bedtime as needed for sleep. Patient not taking: Reported on 12/28/2021 08/20/18   Dorcas Carrow, MD    Current Outpatient Medications  Medication Sig Dispense Refill   amLODipine (NORVASC) 5 MG tablet Take 5 mg by mouth daily.     amLODipine-benazepril (LOTREL) 10-40 MG capsule Take 1 capsule by mouth daily.     atorvastatin (LIPITOR) 20 MG tablet Take 10 mg by mouth every evening.     celecoxib (CELEBREX) 200 MG capsule Take 200 mg by mouth daily.     febuxostat (ULORIC) 40 MG tablet Take 40 mg by mouth daily.     levothyroxine (SYNTHROID) 100 MCG tablet Take 50 mcg by mouth every other day.     metoprolol succinate (TOPROL-XL) 50 MG 24 hr tablet Take 100 mg by mouth daily.     oxybutynin (DITROPAN XL) 15 MG 24 hr tablet Take 15 mg by mouth at bedtime.     pantoprazole (PROTONIX) 40 MG tablet Take 1 tablet (40 mg total) by mouth daily. (Patient taking differently: Take 40 mg by mouth 2 (two) times daily.)  90 tablet 3   tiZANidine (ZANAFLEX) 2 MG tablet Take 4 mg by mouth every 6 (six) hours as needed for muscle spasms. 1-2 tabs daily as needed for muscle spasms     acetaminophen (TYLENOL) 500 MG tablet Take 2 tablets (1,000 mg total) by mouth every 6 (six) hours. (Patient not taking: Reported on 12/26/2021) 30 tablet 0   bumetanide (BUMEX) 2 MG tablet Take by mouth daily.     doxycycline (VIBRA-TABS) 100 MG tablet Take 1 tablet (100 mg total) by mouth 2 (two) times daily. (Patient not taking: Reported on 10/11/2020) 20 tablet 0   dupilumab (DUPIXENT) 300 MG/2ML prefilled syringe Inject 300 mg into the skin every 14 (fourteen) days.     fluconazole (DIFLUCAN) 100 MG tablet Take  100 mg by mouth daily. (Patient not taking: Reported on 10/11/2020)     gentamicin cream (GARAMYCIN) 0.1 % Apply 1 application topically 2 (two) times daily. (Patient not taking: Reported on 10/11/2020) 15 g 1   HYDROcodone-acetaminophen (NORCO) 7.5-325 MG tablet Take 1 tablet by mouth every 6 (six) hours as needed for moderate pain.     metFORMIN (GLUCOPHAGE-XR) 500 MG 24 hr tablet Take 500 mg by mouth daily. (Patient not taking: Reported on 12/26/2021)     olopatadine (PATANOL) 0.1 % ophthalmic solution Place 1 drop into both eyes daily as needed for allergies.  (Patient not taking: Reported on 12/28/2021)     OZEMPIC, 0.25 OR 0.5 MG/DOSE, 2 MG/3ML SOPN Inject 0.25 mg into the skin.     senna (SENOKOT) 8.6 MG tablet Take 1 tablet by mouth daily as needed for constipation. (Patient not taking: Reported on 10/11/2020)     traZODone (DESYREL) 50 MG tablet Take 1 tablet (50 mg total) by mouth at bedtime as needed for sleep. (Patient not taking: Reported on 12/28/2021)     Current Facility-Administered Medications  Medication Dose Route Frequency Provider Last Rate Last Admin   0.9 %  sodium chloride infusion  500 mL Intravenous Once Chai Verdejo V, DO        Allergies as of 12/28/2021 - Review Complete 12/28/2021  Allergen  Reaction Noted   Bee venom Anaphylaxis 07/22/2018   Epinephrine Other (See Comments) 06/08/2008   Sulfamethoxazole-trimethoprim Nausea Only 07/03/2021   Cephalexin Rash 04/18/2021    Family History  Problem Relation Age of Onset   Heart attack Father    Hypertension Father    Hypertension Mother    Diabetes Sister    Colon cancer Maternal Uncle    Heart disease Brother    Colon polyps Sister    Esophageal cancer Neg Hx    Kidney disease Neg Hx     Social History   Socioeconomic History   Marital status: Widowed    Spouse name: Not on file   Number of children: 1   Years of education: Not on file   Highest education level: Not on file  Occupational History   Occupation: Retired    Associate Professor: RETIRED  Tobacco Use   Smoking status: Never   Smokeless tobacco: Never  Vaping Use   Vaping Use: Never used  Substance and Sexual Activity   Alcohol use: No   Drug use: No   Sexual activity: Not on file  Other Topics Concern   Not on file  Social History Narrative   Not on file   Social Determinants of Health   Financial Resource Strain: Not on file  Food Insecurity: Not on file  Transportation Needs: Not on file  Physical Activity: Not on file  Stress: Not on file  Social Connections: Not on file  Intimate Partner Violence: Not on file    Physical Exam: Vital signs in last 24 hours: @BP  137/87   Pulse 73   Temp (!) 97.5 F (36.4 C)   Ht 5\' 2"  (1.575 m)   Wt 193 lb (87.5 kg)   LMP  (LMP Unknown)   SpO2 98%   BMI 35.30 kg/m  GEN: NAD EYE: Sclerae anicteric ENT: MMM CV: Non-tachycardic Pulm: CTA b/l GI: Soft, NT/ND NEURO:  Alert & Oriented x 3   , DO [facility]   12/28/2021 10:59 AM

## 2021-12-28 NOTE — Op Note (Signed)
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 Admitted                            07/2018 with multisystem organ failure and shock,                            requiring emergent subtotal colectomy with end                            ileostomy (pathology showed pan colonic necrosis).                            More recently she developed mucus-like stools from                            the [facility] pouch, and CT abd/pelvis concerning                            for possible soft tissue nodularity of the pouch                            and nonspecific fluid collection along the lower                            anterior abdominal wall, measuring 2.9 x 3.8 x 5.8                            cm, possibly a seroma. She was seen in the                            [facility] surgery clinic and referred for                            expedited pouchoscopy. Medicines:                Monitored Anesthesia Care Procedure:                Pre-Anesthesia Assessment:                           -  Prior to the procedure, a History and Physical                            was performed, and patient medications and                            allergies were reviewed. The patient's tolerance of                            previous anesthesia was also reviewed. The risks                            and benefits of the procedure and the sedation                             options and risks were discussed with the patient.                            All questions were answered, and informed consent                            was obtained. Prior Anticoagulants: The patient has                            taken no anticoagulant or antiplatelet agents. ASA                            Grade Assessment: III - A patient with severe                            systemic disease. After reviewing the risks and                            benefits, the patient was deemed in satisfactory                            condition to undergo the procedure.                           After obtaining informed consent, the scope was                            passed under direct vision. The GIF [id number]                            was introduced through the anus and advanced to the                            the rectum. The flexible sigmoidoscopy was                            accomplished without difficulty. The patient  tolerated the procedure well. Scope In: 11:12:08 AM Scope Out: 11:29:29 AM Total Procedure Duration: 0 hours 17 minutes 21 seconds  Findings:                 The digital rectal exam findings include anal                            stricture.                           A moderate amount of semi-solid stool was found in                            the rectum. Attempts at digital disimpaction were                            somewhat limited by the anal stenosis. Attempted to                            remove stool with roth net, with improvement in                            views.                           A 4 mm fistula was found in base of the Hartmann                            pouch. There was stool visualized emptying through                            through the fistula and into the pouch. The                            surrounding mucosa was otherwise normal appearing.                           There was a probable prior surgical  anastamosis                            located ~5 cm from the anal verge, which was                            healthy appearaing and easily traversed. The rectal                            pouch terminated at ~15 cm from the anal verge. The                            visualized mucosa in the rectum was otherwise                            normal appearing, without any areas of mucosal  erythema, edema, erosions, ulceration, or                            nodularity. Complications:            No immediate complications. Estimated Blood Loss:     Estimated blood loss: none. 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                           - Stool in the rectum.                           - There was a probable prior surgical anastamosis                            located ~5 cm from the anal verge, which was                            healthy appearaing and easily traversed. The rectal                            pouch terminated at ~15 cm from the anal verge.                            There was a fistula noted at the base of the pouch,                            with stool noted to enter through the fistulous                            tract and into the pouch. The visualized mucosa in                            the rectum was otherwise normal appearing                           - No specimens collected. Recommendation:           - Discharge patient to home (with escort).                           - Resume previous diet today.                           - Continue present medications.                           - Follow-up with Dr. Cliffton Asters in the [facility]                            Surgery Clinic at appointment to be scheduled.                           - I  will communicate these results to Dr. Cliffton Asters and                            discuss the role/utility of MRI Pelvis vs CT                            Enterography to further evaluate  the fistulous                            tract. Doristine Locks, MD 12/28/2021 11:48:48 AM

## 2021-12-28 NOTE — Progress Notes (Signed)
To pacu, VSS. Report to [email or web address] 

## 2021-12-28 NOTE — Patient Instructions (Addendum)
-   Resume previous diet today. - Continue present medications.  - Follow-up with Dr. Cliffton Asters in the [facility] at appointment to be scheduled.  - I will communicate these results to Dr. Cliffton Asters and discuss the role/utility of MRI Pelvis vs CT Enterography to further evaluate the fistulous tract.  YOU HAD AN ENDOSCOPIC PROCEDURE TODAY AT THE [facility]:   Refer to the procedure report that was given to you for any specific questions about what was found during the examination.  If the procedure report does not answer your questions, please call your gastroenterologist to clarify.  If you requested that your care partner not be given the details of your procedure findings, then the procedure report has been included in a sealed envelope for you to review at your convenience later.  YOU SHOULD EXPECT: Some feelings of bloating in the abdomen. Passage of more gas than usual.  Walking can help get rid of the air that was put into your GI tract during the procedure and reduce the bloating. If you had a lower endoscopy (such as a colonoscopy or flexible sigmoidoscopy) you may notice spotting of blood in your stool or on the toilet paper. If you underwent a bowel prep for your procedure, you may not have a normal bowel movement for a few days.  Please Note:  You might notice some irritation and congestion in your nose or some drainage.  This is from the oxygen used during your procedure.  There is no need for concern and it should clear up in a day or so.  SYMPTOMS TO REPORT IMMEDIATELY:  Following lower endoscopy (pouchoscopy):  Excessive amounts of blood in the stool  Significant tenderness or worsening of abdominal pains  Swelling of the abdomen that is new, acute  Fever of 100F or higher  For urgent or emergent issues, a gastroenterologist can be reached at any hour by calling [phone]. Do not use MyChart messaging for urgent concerns.    DIET:  We do recommend a  small meal at first, but then you may proceed to your regular diet.  Drink plenty of fluids but you should avoid alcoholic beverages for 24 hours.  ACTIVITY:  You should plan to take it easy for the rest of today and you should NOT DRIVE or use heavy machinery until tomorrow (because of the sedation medicines used during the test).    FOLLOW UP: Our staff will call the number listed on your records the next business day following your procedure.  We will call around 7:15- 8:00 am to check on you and address any questions or concerns that you may have regarding the information given to you following your procedure. If we do not reach you, we will leave a message.      SIGNATURES/CONFIDENTIALITY: You and/or your care partner have signed paperwork which will be entered into your electronic medical record.  These signatures attest to the fact that that the information above on your After Visit Summary has been reviewed and is understood.  Full responsibility of the confidentiality of this discharge information lies with you and/or your care-partner.

## 2021-12-29 ENCOUNTER — Telehealth: Payer: Self-pay | Admitting: *Deleted

## 2021-12-29 NOTE — Telephone Encounter (Signed)
  Follow up Call-     12/28/2021   10:37 AM  Call back number  Post procedure Call Back phone  # [phone]  Permission to leave phone message No     Patient questions:  Do you have a fever, pain , or abdominal swelling? No. Pain Score  0 *  Have you tolerated food without any problems? Yes.    Have you been able to return to your normal activities? Yes.    Do you have any questions about your discharge instructions: Diet   No. Medications  No. Follow up visit  No.  Do you have questions or concerns about your Care? No.  Actions: * If pain score is 4 or above: No action needed, pain <4.

## 2022-01-09 ENCOUNTER — Ambulatory Visit (HOSPITAL_COMMUNITY)
Admission: RE | Admit: 2022-01-09 | Discharge: 2022-01-09 | Disposition: A | Discharge status: Home / Self Care | Payer: Medicare PPO | Source: Ambulatory Visit | Attending: Family Medicine | Admitting: Family Medicine

## 2022-01-09 DIAGNOSIS — Z432 Encounter for attention to ileostomy: Secondary | ICD-10-CM | POA: Insufficient documentation

## 2022-01-09 DIAGNOSIS — Z932 Ileostomy status: Secondary | ICD-10-CM | POA: Insufficient documentation

## 2022-01-09 DIAGNOSIS — L259 Unspecified contact dermatitis, unspecified cause: Secondary | ICD-10-CM | POA: Diagnosis not present

## 2022-01-09 DIAGNOSIS — L24B3 Irritant contact dermatitis related to fecal or urinary stoma or fistula: Secondary | ICD-10-CM | POA: Diagnosis not present

## 2022-01-09 DIAGNOSIS — T85898A Other specified complication of other internal prosthetic devices, implants and grafts, initial encounter: Secondary | ICD-10-CM | POA: Diagnosis not present

## 2022-01-09 DIAGNOSIS — L8990 Pressure ulcer of unspecified site, unspecified stage: Secondary | ICD-10-CM | POA: Diagnosis not present

## 2022-01-09 NOTE — Discharge Instructions (Signed)
We have switched to a 1 piece flexible ITEM # [id number] Added a barrier ring  Added barrier strips ITEM # [id number]

## 2022-01-09 NOTE — Progress Notes (Signed)
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   Qualifier: Diagnosis of  By: Nelson-Smith CMA (AAMA), Dottie     HTN (hypertension)    Hyperlipidemia    Hypothyroidism    Imperforate anus    Obesity    OSA (obstructive sleep apnea)    Palpitations    Reflux esophagitis    Family History  Problem Relation Age of Onset   Heart attack Father    Hypertension Father    Hypertension Mother    Diabetes Sister    Colon cancer Maternal Uncle    Heart disease Brother    Colon polyps Sister    Esophageal cancer Neg Hx    Kidney disease Neg Hx    Allergies  Allergen Reactions   Bee Venom Anaphylaxis   Epinephrine Other (See Comments)    REACTION: Nervousness    Sulfamethoxazole-Trimethoprim Nausea Only    Patient complained of nausea, headache, anxiety and depression.   Cephalexin Rash   Current Outpatient Medications  Medication Sig Dispense Refill Last Dose   acetaminophen (TYLENOL) 500 MG tablet Take 2 tablets (1,000 mg total) by mouth every 6 (six) hours. (Patient not taking: Reported on 12/26/2021) 30 tablet 0    amLODipine (NORVASC) 5 MG tablet Take 5 mg by mouth daily.      amLODipine-benazepril (LOTREL) 10-40 MG capsule Take 1 capsule by mouth daily.      atorvastatin (LIPITOR) 20 MG tablet Take 10 mg by mouth every evening.      bumetanide (BUMEX) 2 MG tablet Take by mouth daily.      celecoxib (CELEBREX) 200 MG capsule Take 200 mg by mouth daily.      doxycycline (VIBRA-TABS) 100 MG tablet Take 1 tablet (100 mg total) by mouth 2 (two) times daily. (Patient not taking: Reported on 10/11/2020) 20 tablet 0    dupilumab (DUPIXENT) 300 MG/2ML prefilled syringe Inject 300 mg into the skin every 14 (fourteen) days.      febuxostat  (ULORIC) 40 MG tablet Take 40 mg by mouth daily.      fluconazole (DIFLUCAN) 100 MG tablet Take 100 mg by mouth daily. (Patient not taking: Reported on 10/11/2020)      gentamicin cream (GARAMYCIN) 0.1 % Apply 1 application topically 2 (two) times daily. (Patient not taking: Reported on 10/11/2020) 15 g 1    HYDROcodone-acetaminophen (NORCO) 7.5-325 MG tablet Take 1 tablet by mouth every 6 (six) hours as needed for moderate pain.      levothyroxine (SYNTHROID) 100 MCG tablet Take 50 mcg by mouth every other day.      metFORMIN (GLUCOPHAGE-XR) 500 MG 24 hr tablet Take 500 mg by mouth daily. (Patient not taking: Reported on 12/26/2021)      metoprolol succinate (TOPROL-XL) 50 MG 24 hr tablet Take 100 mg by mouth daily.      olopatadine (PATANOL) 0.1 % ophthalmic solution Place 1 drop into both eyes daily as needed for allergies.  (Patient not taking: Reported on 12/28/2021)      oxybutynin (DITROPAN XL) 15 MG 24 hr tablet Take 15 mg by mouth at bedtime.      OZEMPIC, 0.25 OR 0.5 MG/DOSE, 2 MG/3ML SOPN Inject 0.25 mg into  the skin.      pantoprazole (PROTONIX) 40 MG tablet Take 1 tablet (40 mg total) by mouth daily. (Patient taking differently: Take 40 mg by mouth 2 (two) times daily.) 90 tablet 3    senna (SENOKOT) 8.6 MG tablet Take 1 tablet by mouth daily as needed for constipation. (Patient not taking: Reported on 10/11/2020)      tiZANidine (ZANAFLEX) 2 MG tablet Take 4 mg by mouth every 6 (six) hours as needed for muscle spasms. 1-2 tabs daily as needed for muscle spasms      traZODone (DESYREL) 50 MG tablet Take 1 tablet (50 mg total) by mouth at bedtime as needed for sleep. (Patient not taking: Reported on 12/28/2021)      No current facility-administered medications for this encounter.   ROS  Review of Systems  Gastrointestinal:        Recent rectal stool noted, has been evaluate by GI service   RLQ ileostomy  Skin:  Positive for color change and rash.       Red ness to peristomal  skin Device related pressure injury to peristomal skin  Psychiatric/Behavioral: Negative.    All other systems reviewed and are negative.  Vital signs:  BP (!) 154/62   Pulse 69   Temp 98 F (36.7 C) (Oral)   Resp 18   Ht 5\' 2"  (1.575 m)   Wt 85.7 kg   LMP  (LMP Unknown)   SpO2 96%   BMI 34.57 kg/m  Exam:  Physical Exam Vitals reviewed.  Constitutional:      Appearance: She is obese.  HENT:     Mouth/Throat:     Mouth: Mucous membranes are dry.     Comments: States she does not drink enough water.   Abdominal:     Palpations: Abdomen is soft.     Comments: Creasing on peristomal area  Skin:    General: Skin is warm and dry.     Findings: Erythema and rash present.     Comments: Peristomal breakdown  Neurological:     Mental Status: She is alert and oriented to person, place, and time.  Psychiatric:        Mood and Affect: Mood normal.        Behavior: Behavior normal.     Stoma type/location:  RLQ ileostomy Stomal assessment/size:  1 1/8", slightly oval. Orders pre cut pouches.  Stoma pink patent and producing soft green stool today Peristomal assessment:  redness and tender to touch from 11 to 1 o'clock.  On the perimeter of the pouching area from 3 to 6 o'clock, there are red irritations.  Consistent with convex ring and added pressure.   Abdomen with skin folds in the waist line creating additional pressure from firm convexity.  Wil switch to flexible convex pouch today.  Treatment options for stomal/peristomal skin: barrier ring to red irritated peristomal skin. Skin prep 1 piece flexible pouch Declines belt due to uneven waist line from multiple abdominal surgeries.  Scarring to midline abdomen Output: soft green stool Ostomy pouching: 1pc.flexible convex with barrier ring and skin prep Education provided:  see above     Impression/dx  Contact dermatitis Ileostomy Device related pressure injury to peristomal skin from convex ring Discussion  Switch to  flexible convex Plan  Will update orders with [place] if needed.     Visit time: 45 minutes.   FNP-BC

## 2022-04-02 ENCOUNTER — Encounter: Payer: Self-pay | Admitting: Gastroenterology

## 2022-04-02 ENCOUNTER — Ambulatory Visit: Payer: Medicare PPO | Admitting: Gastroenterology

## 2022-04-02 VITALS — BP 130/78 | HR 54 | Ht 62.0 in | Wt 186.0 lb

## 2022-04-02 DIAGNOSIS — R432 Parageusia: Secondary | ICD-10-CM | POA: Diagnosis not present

## 2022-04-02 DIAGNOSIS — K219 Gastro-esophageal reflux disease without esophagitis: Secondary | ICD-10-CM | POA: Diagnosis not present

## 2022-04-02 DIAGNOSIS — R12 Heartburn: Secondary | ICD-10-CM

## 2022-04-02 NOTE — Patient Instructions (Addendum)
Continue Protonix 40 MG two times daily for 1 week, then reduce to 40 MG ( 1 Tablet ) daily.  Call in 4 Weeks to update me on Status.   _______________________________________________________  If your blood pressure at your visit was 140/90 or greater, please contact your primary care physician to follow up on this.  _______________________________________________________  If you are age 84 or older, your body mass index should be between 23-30. Your Body mass index is 34.02 kg/m. If this is out of the aforementioned range listed, please consider follow up with your Primary Care Provider.   __________________________________________________________  The [place] [facility] providers would like to encourage you to use [facility] to communicate with providers for non-urgent requests or questions.  Due to long hold times on the telephone, sending your provider a message by [facility] may be a faster and more efficient way to get a response.  Please allow 48 business hours for a response.  Please remember that this is for non-urgent requests.   Due to recent changes in healthcare laws, you may see the results of your imaging and laboratory studies on MyChart before your provider has had a chance to review them.  We understand that in some cases there may be results that are confusing or concerning to you. Not all laboratory results come back in the same time frame and the provider may be waiting for multiple results in order to interpret others.  Please give [place] 48 hours in order for your provider to thoroughly review all the results before contacting the office for clarification of your results.    Thank you for choosing me and [facility].  Vito Cirigliano, D.O.

## 2022-04-02 NOTE — Progress Notes (Signed)
Chief Complaint:    GERD  GI History: Hailey Hughes is a 82 y.o. female with history of HTN, HLD, diabetes, diverticulosis, depression, hypothyroidism, OSA, and history of imperforate anus with surgical history as below, initially seen in the GI clinic 12/26/2021 for evaluation of abnormal CT demonstrating nodularity of the soft tissue of the Hartman's pouch and request for urgent flexible sigmoidoscopy.   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 She ultimately presented to the hospital in June 2020 with multisystem organ failure, acidosis, septic shock, renal failure, Dr. Dalbert Batman took her to the operating room emergently and performed essentially a subtotal colectomy with end ileostomy (final pathology showed pan colonic necrosis).  She was discharged with a full recovery.  Liquid, mucus-like stools and 11/2021. Prior to that, had not seen any stool from the rectum in about 2 years since her surgery in 2020. She presented to the ER on 12/04/2021 with the symptoms with CT A/P as outlined below.  Symptoms lasted for about a week, and was seen in follow-up by Dr. Dema Severin in the Colorectal Surgery clinic on 12/12/2021.  Ostomy intact and healthy-appearing.  Was seen in the GI clinic on 12/26/2021.  At that time, no further stools from the rectum.  - 12/04/2021: CT A/P: Postsurgical changes of colectomy with right lower quadrant ileostomy. Soft tissue nodularity in the rectal pouch with adjacent stranding, could potentially represent a rectal mass and warrants further evaluation with direct visualization. An infectious/inflammatory process/pouchitis is also a possibility.  Ventral hernia containing nonobstructed bowel.  Nonspecific fluid collection along the lower anterior abdominal wall, measuring 2.9 x 3.8 x 5.8 cm, possibly a seroma.   Endoscopic History: -  06/08/2008: EGD: Small 1 cm hiatal hernia, mild gastritis - 06/08/2008: Flexible sigmoidoscopy: Postoperative changes in the sigmoid colon with wide open rectosigmoid anastomosis of 5 cm.  Melanosis coli. - 12/02/2013: Colonoscopy: Evidence of prior colocolonic surgical anastomosis in the rectum which was widely patent.  Colon proximal to anastomosis mildly dilated. Single diverticulum, otherwise normal colon mucosa.  Repeat 10 years - 12/28/2021: Flexible sigmoidoscopy: Anal stricture.  Moderate amount of stool in the rectum.  Digital disimpaction limited by anal stenosis.  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  Surrounding mucosa otherwise normal-appearing.  Probable prior surgical anastomosis located 5 cm from anal verge which was healthy-appearing and easily traversed.  Rectal pouch terminated at 15 cm from anal verge.  Otherwise normal-appearing mucosa throughout.  - 02/15/2022: Follow-up appointment with Dr. Dema Severin at [facility]: No further drainage from rectum.  Recommended conservative management for occult or small fistula  HPI:     Patient is a 82 y.o. female presenting to the Gastroenterology Clinic for f evaluation of reflux symptoms.  She describes a long-standing hx of GERD for many years. Sxs had been generally well controlled with Protonix 40 mg daily, but breakthrough last month with heartburn and regurgitation and spasm feeling. Also with altered taste and texture doesn't feel the same. She called her PCM and was started on Protonix 40 mg BID. Sxs have since abated. No dysphagia.  Does report mouth/gum sensitivity with certain foods.   Has previously trialed Aciphex, Prilosec.   Started Ozempic and Wellbutrin in 09/2021. Otherwise no new meds. Stopped Ozempic in 11/2021. Was prescribed Mounjaro, but has not yet  started. Stopped Wellbutrin a few weeks ago.   No more fecal leakage.   No new imaging or labs since last  appointment.  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 30 tablet 0   amLODipine (NORVASC) 5 MG tablet Take 5 mg by mouth daily.     atorvastatin (LIPITOR) 20 MG tablet Take 10 mg by mouth every evening.     bumetanide (BUMEX) 2 MG tablet Take by mouth daily.     celecoxib (CELEBREX) 200 MG capsule Take 200 mg by mouth daily.     doxycycline (VIBRA-TABS) 100 MG tablet Take 1 tablet (100 mg total) by mouth 2 (two) times daily. 20 tablet 0   dupilumab (DUPIXENT) 300 MG/2ML prefilled syringe Inject 300 mg into the skin every 14 (fourteen) days.     febuxostat (ULORIC) 40 MG tablet Take 40 mg by mouth daily.     fluconazole (DIFLUCAN) 100 MG tablet Take 100 mg by mouth daily.     gentamicin cream (GARAMYCIN) 0.1 % Apply 1 application topically 2 (two) times daily. 15 g 1   HYDROcodone-acetaminophen (NORCO) 7.5-325 MG tablet Take 1 tablet by mouth every 6 (six) hours as needed for moderate pain.     levothyroxine (SYNTHROID) 100 MCG tablet Take 50 mcg by mouth every other day.     metFORMIN (GLUCOPHAGE-XR) 500 MG 24 hr tablet Take 500 mg by mouth daily.     metoprolol succinate (TOPROL-XL) 50 MG 24 hr tablet Take 100 mg by mouth daily.     olopatadine (PATANOL) 0.1 %  ophthalmic solution Place 1 drop into both eyes daily as needed for allergies.     oxybutynin (DITROPAN XL) 15 MG 24 hr tablet Take 15 mg by mouth at bedtime.     pantoprazole (PROTONIX) 40 MG tablet Take 1 tablet (40 mg total) by mouth daily. (Patient taking differently: Take 40 mg by mouth 2 (two) times daily.) 90 tablet 3   tiZANidine (ZANAFLEX) 2 MG tablet Take 4 mg by mouth every 6 (six) hours as needed for muscle spasms. 1-2 tabs daily as needed for muscle spasms     traZODone (DESYREL) 50 MG tablet Take 1 tablet (50 mg total) by mouth at bedtime as needed for sleep.     amLODipine-benazepril (LOTREL) 10-40 MG capsule Take 1 capsule by mouth daily. (Patient not taking: Reported on 04/02/2022)     OZEMPIC, 0.25 OR 0.5 MG/DOSE, 2 MG/3ML SOPN Inject 0.25 mg into the skin. (Patient not taking: Reported on 04/02/2022)     senna (SENOKOT) 8.6 MG tablet Take 1 tablet by mouth daily as needed for constipation. (Patient not taking: Reported on 04/02/2022)  No current facility-administered medications for this visit.    Physical Exam:     BP 130/78   Pulse (!) 54   Ht 5' 2"$  (1.575 m)   Wt 186 lb (84.4 kg)   LMP  (LMP Unknown)   SpO2 98%   BMI 34.02 kg/m   GENERAL:  Pleasant female in NAD PSYCH: : Cooperative, normal affect NEURO: Alert and oriented x 3   IMPRESSION and PLAN:    1) GERD 2) Heartburn 3) Dysguesia 82 year old female with longstanding history of reflux which has been generally well-controlled with daily PPI, but recent breakthrough symptoms.  Unclear what provoked her recent flare, but discussed full DDx today.  Symptoms have responded nicely to trial of high-dose PPI by her PCM.  Plan as follows:  - Continue Protonix 40 mg BID x1 more week, then if still well controlled, reduce to 40 mg daily - I have asked her to update me in 4 weeks by phone to let me know how she is doing - If reflux symptoms recur at daily dosing, discussed plan for EGD to evaluate for erosive  esophagitis, worsening hiatal hernia, significant LES laxity, etc.  4) History of subtotal colectomy with end ileostomy Flexible sigmoidoscopy in 12/2021 with anal stricture and fistula at the base of the [facility] pouch.  She has since followed up with Dr. Dema Severin who recommended conservative management.  Symptoms have since resolved. -Management per Colorectal Surgery            Lavena Bullion ,DO, FACG 04/02/2022, 11:05 AM

## 2022-07-27 ENCOUNTER — Encounter: Payer: Self-pay | Admitting: Gastroenterology

## 2022-08-20 ENCOUNTER — Other Ambulatory Visit: Payer: Self-pay

## 2022-08-20 ENCOUNTER — Ambulatory Visit (AMBULATORY_SURGERY_CENTER): Payer: Medicare PPO

## 2022-08-20 VITALS — Ht 62.0 in | Wt 174.0 lb

## 2022-08-20 DIAGNOSIS — K219 Gastro-esophageal reflux disease without esophagitis: Secondary | ICD-10-CM

## 2022-08-20 NOTE — Progress Notes (Signed)
Denies allergies to eggs or soy products. Denies complication of anesthesia or sedation. Denies use of weight loss medication. Denies use of O2.   Emmi instructions given for colonoscopy.  

## 2022-08-24 ENCOUNTER — Encounter: Payer: Self-pay | Admitting: Gastroenterology

## 2022-08-29 ENCOUNTER — Ambulatory Visit: Payer: Medicare PPO | Admitting: Physician Assistant

## 2022-08-31 ENCOUNTER — Telehealth: Payer: Self-pay | Admitting: Gastroenterology

## 2022-08-31 NOTE — Telephone Encounter (Signed)
Inbound call from patient requesting a call back from nurse to discuss questions about her EGD on 7/22 at 10:00. Please advise.

## 2022-09-03 ENCOUNTER — Ambulatory Visit (AMBULATORY_SURGERY_CENTER): Payer: Medicare PPO | Admitting: Gastroenterology

## 2022-09-03 ENCOUNTER — Encounter: Payer: Self-pay | Admitting: Gastroenterology

## 2022-09-03 VITALS — BP 142/73 | HR 60 | Temp 98.0°F | Resp 17 | Ht 62.0 in | Wt 174.0 lb

## 2022-09-03 DIAGNOSIS — K259 Gastric ulcer, unspecified as acute or chronic, without hemorrhage or perforation: Secondary | ICD-10-CM

## 2022-09-03 DIAGNOSIS — K219 Gastro-esophageal reflux disease without esophagitis: Secondary | ICD-10-CM | POA: Diagnosis not present

## 2022-09-03 DIAGNOSIS — R12 Heartburn: Secondary | ICD-10-CM | POA: Diagnosis not present

## 2022-09-03 DIAGNOSIS — R432 Parageusia: Secondary | ICD-10-CM

## 2022-09-03 DIAGNOSIS — K2970 Gastritis, unspecified, without bleeding: Secondary | ICD-10-CM | POA: Diagnosis not present

## 2022-09-03 MED ORDER — RABEPRAZOLE SODIUM 20 MG PO TBEC: 20.0000 mg | DELAYED_RELEASE_TABLET | Freq: Every day | ORAL | 1 refills | Status: DC

## 2022-09-03 MED ORDER — SODIUM CHLORIDE 0.9 % IV SOLN: 500.0000 mL | Freq: Once | INTRAVENOUS | Status: DC

## 2022-09-03 NOTE — Progress Notes (Signed)
Pt resting comfortably. VSS. Airway intact. SBAR complete to RN. All questions answered.   

## 2022-09-03 NOTE — Progress Notes (Signed)
GASTROENTEROLOGY PROCEDURE H&P NOTE   Primary Care Physician: Richmond Campbell., PA-C    Reason for Procedure:   GERD, heartburn, regurgitation  Plan:    EGD  Patient is appropriate for endoscopic procedure(s) in the ambulatory ([facility]) setting.  The nature of the procedure, as well as the risks, benefits, and alternatives were carefully and thoroughly reviewed with the patient. Ample time for discussion and questions allowed. The patient understood, was satisfied, and agreed to proceed.     HPI: Hailey Hughes is a 82 y.o. female who presents for EGD for evaluation of breakthrough reflux sxs despite high dose PPI. Currently treated with Protonix 40 mg BID, but still breakthrough HB and regurgitation along with altered taste.   Holding Mounjaro >1 week for procedure today.   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   Qualifier: Diagnosis of  By: Nelson-Smith CMA (AAMA), Dottie     HTN (hypertension)    Hyperlipidemia    Hypothyroidism    Imperforate anus    Obesity    OSA (obstructive sleep apnea)    Palpitations    Reflux esophagitis    Sleep apnea     Past Surgical History:  Procedure Laterality Date   APPENDECTOMY     CARPAL TUNNEL RELEASE Right    COLON RESECTION  1958   age 58   COLOSTOMY  22   age 38 due to imperforate anus   CYSTECTOMY     spinal   LAPAROTOMY N/A 07/23/2018   Procedure: TOTAL ABDOMINAL COLECTOMY, [name], CHOLECYSTECTOMY;  Surgeon: Claud Kelp, MD;  Location: [facility];  Service: General;  Laterality: N/A;   RECTOVAGINAL FISTULA CLOSURE     congenital    Prior to Admission medications   Medication Sig Start Date End Date Taking?  Authorizing Provider  acetaminophen (TYLENOL) 500 MG tablet Take 2 tablets (1,000 mg total) by mouth every 6 (six) hours. 08/20/18   Dorcas Carrow, MD  atorvastatin (LIPITOR) 20 MG tablet Take 10 mg by mouth every evening.    [provider]  bumetanide (BUMEX) 2 MG tablet Take by mouth daily.    [provider]  celecoxib (CELEBREX) 200 MG capsule Take 200 mg by mouth daily. 11/27/18   [provider]  doxycycline (VIBRA-TABS) 100 MG tablet Take 1 tablet (100 mg total) by mouth 2 (two) times daily. 02/23/19   Felecia Shelling, DPM  dupilumab (DUPIXENT) 300 MG/2ML prefilled syringe Inject 300 mg into the skin every 14 (fourteen) days.    [provider]  febuxostat (ULORIC) 40 MG tablet Take 40 mg by mouth daily.    [provider]  fluconazole (DIFLUCAN) 100 MG tablet Take 100 mg by mouth daily. Patient not taking: Reported on 08/20/2022 12/04/18   [provider]  HYDROcodone-acetaminophen (NORCO) 7.5-325 MG tablet Take 1 tablet by mouth every 6 (six) hours as needed for moderate pain.    [provider]  levothyroxine (SYNTHROID) 100 MCG tablet Take 50 mcg by mouth every other day.    [provider]  losartan (COZAAR) 100 MG tablet Take 100 mg by mouth daily.    [provider]  metFORMIN (GLUCOPHAGE-XR) 500 MG 24 hr tablet Take 500 mg by mouth daily. Patient not taking: Reported on 08/20/2022 01/20/19   [provider]  metoprolol succinate (TOPROL-XL) 50 MG 24 hr tablet Take 100 mg by mouth daily. 12/30/17   [provider]  nystatin (MYCOSTATIN/NYSTOP) powder Apply 1 Application topically 3 (three) times daily.    [provider]  olopatadine (PATANOL) 0.1 % ophthalmic solution Place 1 drop into both eyes daily as needed for allergies. 12/30/17   [provider]  oxybutynin (DITROPAN XL) 15 MG 24 hr tablet Take 15 mg by mouth at bedtime.    [provider]  pantoprazole  (PROTONIX) 40 MG tablet Take 1 tablet (40 mg total) by mouth daily. Patient taking differently: Take 40 mg by mouth 2 (two) times daily. 02/09/14   Hart Carwin, MD  tirzepatide [facility]) 2.5 MG/0.5ML Pen Inject 2.5 mg into the skin once a week.    [provider]  tiZANidine (ZANAFLEX) 2 MG tablet Take 4 mg by mouth every 6 (six) hours as needed for muscle spasms. 1-2 tabs daily as needed for muscle spasms    [provider]    Current Outpatient Medications  Medication Sig Dispense Refill   acetaminophen (TYLENOL) 500 MG tablet Take 2 tablets (1,000 mg total) by mouth every 6 (six) hours. 30 tablet 0   atorvastatin (LIPITOR) 20 MG tablet Take 10 mg by mouth every evening.     bumetanide (BUMEX) 2 MG tablet Take by mouth daily.     celecoxib (CELEBREX) 200 MG capsule Take 200 mg by mouth daily.     doxycycline (VIBRA-TABS) 100 MG tablet Take 1 tablet (100 mg total) by mouth 2 (two) times daily. 20 tablet 0   dupilumab (DUPIXENT) 300 MG/2ML prefilled syringe Inject 300 mg into the skin every 14 (fourteen) days.     febuxostat (ULORIC) 40 MG tablet Take 40 mg by mouth daily.     fluconazole (DIFLUCAN) 100 MG tablet Take 100 mg by mouth daily. (Patient not taking: Reported on 08/20/2022)     HYDROcodone-acetaminophen (NORCO) 7.5-325 MG tablet Take 1 tablet by mouth every 6 (six) hours as needed for moderate pain.     levothyroxine (SYNTHROID) 100 MCG tablet Take 50 mcg by mouth every other day.     losartan (COZAAR) 100 MG tablet Take 100 mg by mouth daily.     metFORMIN (GLUCOPHAGE-XR) 500 MG 24 hr tablet Take 500 mg by mouth daily. (Patient not taking: Reported on 08/20/2022)     metoprolol succinate (TOPROL-XL) 50 MG 24 hr tablet Take 100 mg by mouth daily.     nystatin (MYCOSTATIN/NYSTOP) powder Apply 1 Application topically 3 (three) times daily.     olopatadine (PATANOL) 0.1 % ophthalmic solution Place 1 drop into both eyes daily as needed for allergies.     oxybutynin  (DITROPAN XL) 15 MG 24 hr tablet Take 15 mg by mouth at bedtime.     pantoprazole (PROTONIX) 40 MG tablet Take 1 tablet (40 mg total) by mouth daily. (Patient taking differently: Take 40 mg by mouth 2 (two) times daily.) 90 tablet 3   tirzepatide (MOUNJARO) 2.5 MG/0.5ML Pen Inject 2.5 mg into the skin once a week.     tiZANidine (ZANAFLEX) 2 MG tablet Take 4 mg by mouth every 6 (six) hours as needed for muscle spasms. 1-2 tabs daily as needed for muscle spasms     No current facility-administered medications for this visit.  Allergies as of 09/03/2022 - Review Complete 08/20/2022  Allergen Reaction Noted   Bee venom Anaphylaxis 07/22/2018   Epinephrine Other (See Comments) 06/08/2008   Sulfamethoxazole-trimethoprim Nausea Only 07/03/2021   Cephalexin Rash 04/18/2021    Family History  Problem Relation Age of Onset   Hypertension Mother    Heart attack Father    Hypertension Father    Diabetes Sister    Colon polyps Sister    Heart disease Brother    Colon cancer Maternal Uncle    Esophageal cancer Neg Hx    Kidney disease Neg Hx    Rectal cancer Neg Hx    Stomach cancer Neg Hx     Social History   Socioeconomic History   Marital status: Widowed    Spouse name: Not on file   Number of children: 1   Years of education: Not on file   Highest education level: Not on file  Occupational History   Occupation: Retired    Associate Professor: RETIRED  Tobacco Use   Smoking status: Never   Smokeless tobacco: Never  Vaping Use   Vaping status: Never Used  Substance and Sexual Activity   Alcohol use: No   Drug use: No   Sexual activity: Not on file  Other Topics Concern   Not on file  Social History Narrative   Not on file   Social Determinants of Health   Financial Resource Strain: Not on file  Food Insecurity: Not on file  Transportation Needs: Not on file  Physical Activity: Not on file  Stress: Not on file  Social Connections: Not on file  Intimate Partner Violence: Not  on file    Physical Exam: Vital signs in last 24 hours: @LMP   (LMP Unknown)  GEN: NAD EYE: Sclerae anicteric ENT: MMM CV: Non-tachycardic Pulm: CTA b/l GI: Soft, NT/ND NEURO:  Alert & Oriented x 3   Doristine Locks, DO [facility]   09/03/2022 9:35 AM

## 2022-09-03 NOTE — Patient Instructions (Signed)
Your new medication is aciphex.  Take one in the am 1/2 hour before breakfast, and the other 1/2 hour before lunch or supper.  Do not take on a full stomach.  Take it for 1 month.  Resume all of your other medications as ordered today.  Read all of the handouts given to you by your recovery room nurse.  YOU HAD AN ENDOSCOPIC PROCEDURE TODAY AT THE [facility]:   Refer to the procedure report that was given to you for any specific questions about what was found during the examination.  If the procedure report does not answer your questions, please call your gastroenterologist to clarify.  If you requested that your care partner not be given the details of your procedure findings, then the procedure report has been included in a sealed envelope for you to review at your convenience later.  YOU SHOULD EXPECT: Some feelings of bloating in the abdomen. Passage of more gas than usual.   Please Note:  You might notice some irritation and congestion in your nose or some drainage.  This is from the oxygen used during your procedure.  There is no need for concern and it should clear up in a day or so.  SYMPTOMS TO REPORT IMMEDIATELY:  Following upper endoscopy (EGD)  Vomiting of blood or coffee ground material  New chest pain or pain under the shoulder blades  Painful or persistently difficult swallowing  New shortness of breath  Fever of 100F or higher  Black, tarry-looking stools  For urgent or emergent issues, a gastroenterologist can be reached at any hour by calling [phone]. Do not use MyChart messaging for urgent concerns.    DIET:  We do recommend a small meal preferable soft at first, but then you may proceed to your regular diet.  Drink plenty of fluids but you should avoid alcoholic beverages for 24 hours.  ACTIVITY:  You should plan to take it easy for the rest of today and you should NOT DRIVE or use heavy machinery until tomorrow (because of the sedation medicines used  during the test).    FOLLOW UP: Our staff will call the number listed on your records the next business day following your procedure.  We will call around 7:15- 8:00 am to check on you and address any questions or concerns that you may have regarding the information given to you following your procedure. If we do not reach you, we will leave a message.     If any biopsies were taken you will be contacted by phone or by letter within the next 1-3 weeks.  Please call us at [phone] if you have not heard about the biopsies in 3 weeks.    SIGNATURES/CONFIDENTIALITY: You and/or your care partner have signed paperwork which will be entered into your electronic medical record.  These signatures attest to the fact that that the information above on your After Visit Summary has been reviewed and is understood.  Full responsibility of the confidentiality of this discharge information lies with you and/or your care-partner.

## 2022-09-03 NOTE — Progress Notes (Signed)
VS completed by DT.  Pt's states no medical or surgical changes since previsit or office visit.  

## 2022-09-03 NOTE — Op Note (Signed)
[facility] Patient Name: Hailey Hughes Procedure Date: 09/03/2022 10:05 AM MRN: [id number] Endoscopist: Doristine Locks , MD, [id number] Age: 82 Referring MD:  Date of Birth: 1940-12-29 Gender: Female Account #: [id number] Procedure:                Upper GI endoscopy Indications:              Heartburn, Esophageal reflux, Regurgitation,                            Dysguesia                           82 yo female with history of GERD, with                            breakthrough symptoms earlier this year. Continues                            to have dysguesia despite pantoprazole 40 mg BID. Medicines:                Monitored Anesthesia Care Procedure:                Pre-Anesthesia Assessment:                           - Prior to the procedure, a History and Physical                            was performed, and patient medications and                            allergies were reviewed. The patient's tolerance of                            previous anesthesia was also reviewed. The risks                            and benefits of the procedure and the sedation                            options and risks were discussed with the patient.                            All questions were answered, and informed consent                            was obtained. Prior Anticoagulants: The patient has                            taken no anticoagulant or antiplatelet agents. ASA                            Grade Assessment: III - A patient with severe  systemic disease. After reviewing the risks and                            benefits, the patient was deemed in satisfactory                            condition to undergo the procedure.                           After obtaining informed consent, the endoscope was                            passed under direct vision. Throughout the                            procedure, the patient's blood pressure, pulse, and                             oxygen saturations were monitored continuously. The                            Olympus Scope [id number] was introduced through the                            mouth, and advanced to the second part of duodenum.                            The upper GI endoscopy was accomplished without                            difficulty. The patient tolerated the procedure                            well. Scope In: Scope Out: Findings:                 The examined esophagus was normal.                           The Z-line was regular and was found 38 cm from the                            incisors.                           The gastroesophageal flap valve was visualized                            endoscopically and classified as Hill Grade III                            (minimal fold, loose to endoscope, hiatal hernia                            likely).  Moderate inflammation characterized by granularity,                            edema was found in the gastric fundus, gastric                            body, at the incisura, and in the gastric antrum.                            This had a striped quality with adherent food                            debris. Debris was lavaged off to reveal localized                            areas of granularity and edema. This was most                            pronounced in the gastric body. Several mucosal                            biopsies were taken with a cold forceps for                            histology from the gastric body (jar 1) and                            antrum/incisura (jar 2). Estimated blood loss was                            minimal.                           The examined duodenum was normal. Complications:            No immediate complications. Estimated Blood Loss:     Estimated blood loss was minimal. Impression:               - Normal esophagus.                           - Z-line regular,  38 cm from the incisors.                           - Gastroesophageal flap valve classified as Hill                            Grade III (minimal fold, loose to endoscope, hiatal                            hernia likely).                           - Gastritis. Biopsied.                           -  Normal examined duodenum. Recommendation:           - Patient has a contact number available for                            emergencies. The signs and symptoms of potential                            delayed complications were discussed with the                            patient. Return to normal activities tomorrow.                            Written discharge instructions were provided to the                            patient.                           - Resume previous diet.                           - Use Aciphex (rabeprazole) 20 mg PO BID for 4                            weeks.                           - When starting Aciphex, can stop the pantoprazole.                           - Can resume other presently prescribed medications.                           - Await pathology results.                           - If ongoing symptoms of dysguesia, will refer to                            an ENT specialist. Doristine Locks, MD 09/03/2022 10:44:02 AM

## 2022-09-03 NOTE — Progress Notes (Signed)
Called to room to assist during endoscopic procedure.  Patient ID and intended procedure confirmed with present staff. Received instructions for my participation in the procedure from the performing physician.  

## 2022-09-04 ENCOUNTER — Telehealth: Payer: Self-pay

## 2022-09-04 NOTE — Telephone Encounter (Signed)
Left message on follow up call. 

## 2022-09-05 ENCOUNTER — Telehealth: Payer: Self-pay | Admitting: Gastroenterology

## 2022-09-05 NOTE — Telephone Encounter (Signed)
Agree that this is unrelated and can f/u with PCM. Thanks for clarification on the Aciphex Rx. Agree with plan as outlined of 4 weeks at BID dosing, then decrease to daily dosing.   Called and left patient a detailed vm with information above. Pt has been advised to contact the pharmacy when she needs a refill of Aciphex and they will let us know. Otherwise, follow up with PCP regarding low grade fever and cough.

## 2022-09-05 NOTE — Telephone Encounter (Signed)
Returned call to patient. The incorrect prescription was sent in, it was sent for Aciphex 20 mg daily. Pt has already picked up prescription, based on RX pt should have 60 tablets which will allow her to take BID for 4 weeks as recommended. Will patient need to continue Aciphex 20 mg daily after the 4 weeks? Pt has been dealing with an abdominal wall abscess that her PCP has been treating. Pt has not had any fever until after EGD appt on 09/03/22. Pt states the afternoon of her appointment she had a low-grade fever of 99.2 so she did not call anyone. Yesterday temp was 98.2 and back to 99.2 today. No chills. Pt does report that yesterday she developed a productive cough. Coughing up thick, and clear phlegm. I told pt that this is likely not related to her procedure and you may direct her to PCP for evaluation. I told pt that I would check with you either way. Please advise, thanks

## 2022-09-05 NOTE — Telephone Encounter (Signed)
PT is calling about Aciphex. She was told her the written RX would be for twice a day but pharmacy gave her the RX for once a day. She wants to clarify the order. Please advise. She also has had a fever since she had procedure on Friday.

## 2022-11-22 ENCOUNTER — Other Ambulatory Visit: Payer: Self-pay | Admitting: Gastroenterology

## 2022-11-22 ENCOUNTER — Telehealth: Payer: Self-pay | Admitting: Gastroenterology

## 2022-11-22 NOTE — Telephone Encounter (Signed)
Rx for Aciphex sent to pharmacy as requested.

## 2022-11-22 NOTE — Telephone Encounter (Signed)
Patient called to request a refill for Acipex and said she is down to two.

## 2023-06-13 ENCOUNTER — Other Ambulatory Visit: Payer: Self-pay | Admitting: Gastroenterology
